# Patient Record
Sex: Male | Born: 1940
Health system: Southern US, Community
[De-identification: ages and names within clinical notes are randomized; demographics above are authoritative.]

## PROBLEM LIST (undated history)

## (undated) DIAGNOSIS — M2540 Effusion, unspecified joint: Secondary | ICD-10-CM

## (undated) DIAGNOSIS — C61 Malignant neoplasm of prostate: Secondary | ICD-10-CM

## (undated) DIAGNOSIS — E785 Hyperlipidemia, unspecified: Secondary | ICD-10-CM

## (undated) DIAGNOSIS — R519 Headache, unspecified: Secondary | ICD-10-CM

## (undated) DIAGNOSIS — R350 Frequency of micturition: Secondary | ICD-10-CM

## (undated) DIAGNOSIS — K5900 Constipation, unspecified: Secondary | ICD-10-CM

## (undated) DIAGNOSIS — M1990 Unspecified osteoarthritis, unspecified site: Secondary | ICD-10-CM

## (undated) DIAGNOSIS — J189 Pneumonia, unspecified organism: Secondary | ICD-10-CM

## (undated) DIAGNOSIS — M1712 Unilateral primary osteoarthritis, left knee: Secondary | ICD-10-CM

## (undated) DIAGNOSIS — I10 Essential (primary) hypertension: Secondary | ICD-10-CM

## (undated) DIAGNOSIS — Z973 Presence of spectacles and contact lenses: Secondary | ICD-10-CM

## (undated) DIAGNOSIS — R351 Nocturia: Secondary | ICD-10-CM

## (undated) DIAGNOSIS — M2550 Pain in unspecified joint: Secondary | ICD-10-CM

## (undated) DIAGNOSIS — Z972 Presence of dental prosthetic device (complete) (partial): Secondary | ICD-10-CM

## (undated) DIAGNOSIS — R51 Headache: Secondary | ICD-10-CM

## (undated) DIAGNOSIS — T8482XA Fibrosis due to internal orthopedic prosthetic devices, implants and grafts, initial encounter: Secondary | ICD-10-CM

## (undated) HISTORY — DX: Hyperlipidemia, unspecified: E78.5

## (undated) HISTORY — PX: HERNIA REPAIR: SHX51

## (undated) HISTORY — DX: Essential (primary) hypertension: I10

## (undated) HISTORY — PX: COLONOSCOPY: SHX174

---

## 2000-08-29 ENCOUNTER — Encounter: Payer: Self-pay | Admitting: Internal Medicine

## 2003-05-02 ENCOUNTER — Encounter: Payer: Self-pay | Admitting: Internal Medicine

## 2003-05-02 ENCOUNTER — Ambulatory Visit (HOSPITAL_COMMUNITY): Admission: RE | Admit: 2003-05-02 | Discharge: 2003-05-02 | Payer: Self-pay | Admitting: Internal Medicine

## 2004-08-18 ENCOUNTER — Encounter: Payer: Self-pay | Admitting: Internal Medicine

## 2004-11-02 ENCOUNTER — Ambulatory Visit: Payer: Self-pay | Admitting: Internal Medicine

## 2005-01-14 ENCOUNTER — Ambulatory Visit: Payer: Self-pay | Admitting: Internal Medicine

## 2005-02-19 ENCOUNTER — Ambulatory Visit: Payer: Self-pay | Admitting: Internal Medicine

## 2005-03-08 ENCOUNTER — Ambulatory Visit: Payer: Self-pay | Admitting: Internal Medicine

## 2005-03-17 ENCOUNTER — Ambulatory Visit (HOSPITAL_COMMUNITY): Admission: RE | Admit: 2005-03-17 | Discharge: 2005-03-17 | Payer: Self-pay | Admitting: Orthopedic Surgery

## 2005-05-04 ENCOUNTER — Ambulatory Visit: Payer: Self-pay | Admitting: Internal Medicine

## 2005-05-11 ENCOUNTER — Ambulatory Visit: Payer: Self-pay | Admitting: Internal Medicine

## 2005-08-11 ENCOUNTER — Ambulatory Visit: Payer: Self-pay | Admitting: Internal Medicine

## 2005-08-18 ENCOUNTER — Ambulatory Visit: Payer: Self-pay | Admitting: Internal Medicine

## 2005-08-25 ENCOUNTER — Ambulatory Visit: Payer: Self-pay | Admitting: Internal Medicine

## 2005-11-15 ENCOUNTER — Ambulatory Visit: Payer: Self-pay | Admitting: Internal Medicine

## 2006-01-18 ENCOUNTER — Ambulatory Visit: Payer: Self-pay | Admitting: Internal Medicine

## 2006-02-08 ENCOUNTER — Ambulatory Visit: Payer: Self-pay | Admitting: Internal Medicine

## 2006-02-15 ENCOUNTER — Ambulatory Visit: Payer: Self-pay | Admitting: Internal Medicine

## 2006-06-21 ENCOUNTER — Ambulatory Visit: Payer: Self-pay | Admitting: Internal Medicine

## 2006-09-26 ENCOUNTER — Ambulatory Visit: Payer: Self-pay | Admitting: Internal Medicine

## 2006-10-11 ENCOUNTER — Ambulatory Visit: Payer: Self-pay | Admitting: Internal Medicine

## 2006-10-27 ENCOUNTER — Ambulatory Visit: Payer: Self-pay | Admitting: Internal Medicine

## 2006-10-27 ENCOUNTER — Encounter: Payer: Self-pay | Admitting: Internal Medicine

## 2006-10-27 ENCOUNTER — Encounter (INDEPENDENT_AMBULATORY_CARE_PROVIDER_SITE_OTHER): Payer: Self-pay | Admitting: *Deleted

## 2006-10-27 LAB — HM COLONOSCOPY

## 2006-11-22 ENCOUNTER — Ambulatory Visit: Payer: Self-pay | Admitting: Internal Medicine

## 2007-04-11 ENCOUNTER — Ambulatory Visit: Payer: Self-pay | Admitting: Internal Medicine

## 2007-06-19 DIAGNOSIS — E1159 Type 2 diabetes mellitus with other circulatory complications: Secondary | ICD-10-CM

## 2007-06-19 DIAGNOSIS — I10 Essential (primary) hypertension: Secondary | ICD-10-CM | POA: Insufficient documentation

## 2007-06-19 DIAGNOSIS — E785 Hyperlipidemia, unspecified: Secondary | ICD-10-CM | POA: Insufficient documentation

## 2007-07-04 ENCOUNTER — Ambulatory Visit: Payer: Self-pay | Admitting: Internal Medicine

## 2007-07-04 LAB — CONVERTED CEMR LAB
ALT: 16 units/L (ref 0–53)
AST: 21 units/L (ref 0–37)
Albumin: 3.8 g/dL (ref 3.5–5.2)
Alkaline Phosphatase: 109 units/L (ref 39–117)
BUN: 17 mg/dL (ref 6–23)
Basophils Absolute: 0 10*3/uL (ref 0.0–0.1)
Basophils Relative: 0.9 % (ref 0.0–1.0)
Bilirubin, Direct: 0.1 mg/dL (ref 0.0–0.3)
CO2: 33 meq/L — ABNORMAL HIGH (ref 19–32)
Calcium: 9.9 mg/dL (ref 8.4–10.5)
Chloride: 107 meq/L (ref 96–112)
Cholesterol: 182 mg/dL (ref 0–200)
Creatinine, Ser: 1 mg/dL (ref 0.4–1.5)
Eosinophils Absolute: 0.1 10*3/uL (ref 0.0–0.6)
Eosinophils Relative: 1.6 % (ref 0.0–5.0)
GFR calc Af Amer: 97 mL/min
GFR calc non Af Amer: 80 mL/min
Glucose, Bld: 149 mg/dL — ABNORMAL HIGH (ref 70–99)
HCT: 40.1 % (ref 39.0–52.0)
HDL: 40 mg/dL (ref >39.0)
Hemoglobin: 13.6 g/dL (ref 13.0–17.0)
Hgb A1c MFr Bld: 6.6 % — ABNORMAL HIGH (ref 4.6–6.0)
LDL Cholesterol: 124 mg/dL — ABNORMAL HIGH (ref 0–99)
Lymphocytes Relative: 48.4 % — ABNORMAL HIGH (ref 12.0–46.0)
MCHC: 34 g/dL (ref 30.0–36.0)
MCV: 95.4 fL (ref 78.0–100.0)
Monocytes Absolute: 0.5 10*3/uL (ref 0.2–0.7)
Monocytes Relative: 8.2 % (ref 3.0–11.0)
Neutro Abs: 2.2 10*3/uL (ref 1.4–7.7)
Neutrophils Relative %: 40.9 % — ABNORMAL LOW (ref 43.0–77.0)
PSA: 1.26 ng/mL (ref 0.10–4.00)
Platelets: 327 10*3/uL (ref 150–400)
Potassium: 3.6 meq/L (ref 3.5–5.1)
RBC: 4.2 M/uL — ABNORMAL LOW (ref 4.22–5.81)
RDW: 12.5 % (ref 11.5–14.6)
Sodium: 147 meq/L — ABNORMAL HIGH (ref 135–145)
TSH: 1.48 microintl units/mL (ref 0.35–5.50)
Total Bilirubin: 1.1 mg/dL (ref 0.3–1.2)
Total CHOL/HDL Ratio: 4.6
Total Protein: 6.9 g/dL (ref 6.0–8.3)
Triglycerides: 88 mg/dL (ref 0–149)
VLDL: 18 mg/dL (ref 0–40)
WBC: 5.5 10*3/uL (ref 4.5–10.5)

## 2007-07-11 ENCOUNTER — Ambulatory Visit: Payer: Self-pay | Admitting: Internal Medicine

## 2007-07-11 ENCOUNTER — Encounter: Payer: Self-pay | Admitting: Internal Medicine

## 2007-10-31 ENCOUNTER — Ambulatory Visit: Payer: Self-pay | Admitting: Internal Medicine

## 2008-01-01 ENCOUNTER — Telehealth: Payer: Self-pay | Admitting: Internal Medicine

## 2008-01-03 ENCOUNTER — Ambulatory Visit: Payer: Self-pay | Admitting: Internal Medicine

## 2008-02-12 ENCOUNTER — Ambulatory Visit: Payer: Self-pay | Admitting: Internal Medicine

## 2008-02-12 DIAGNOSIS — E119 Type 2 diabetes mellitus without complications: Secondary | ICD-10-CM | POA: Insufficient documentation

## 2008-02-16 LAB — CONVERTED CEMR LAB
ALT: 15 units/L (ref 0–53)
AST: 19 units/L (ref 0–37)
Albumin: 4.2 g/dL (ref 3.5–5.2)
Alkaline Phosphatase: 119 units/L — ABNORMAL HIGH (ref 39–117)
BUN: 15 mg/dL (ref 6–23)
Bilirubin, Direct: 0.2 mg/dL (ref 0.0–0.3)
CO2: 31 meq/L (ref 19–32)
Calcium: 10.4 mg/dL (ref 8.4–10.5)
Chloride: 100 meq/L (ref 96–112)
Cholesterol: 198 mg/dL (ref 0–200)
Creatinine, Ser: 1.1 mg/dL (ref 0.4–1.5)
GFR calc Af Amer: 87 mL/min
GFR calc non Af Amer: 72 mL/min
Glucose, Bld: 141 mg/dL — ABNORMAL HIGH (ref 70–99)
HDL: 37.9 mg/dL — ABNORMAL LOW (ref >39.0)
Hgb A1c MFr Bld: 6.7 % — ABNORMAL HIGH (ref 4.6–6.0)
LDL Cholesterol: 134 mg/dL — ABNORMAL HIGH (ref 0–99)
Potassium: 3.8 meq/L (ref 3.5–5.1)
Sodium: 138 meq/L (ref 135–145)
Total Bilirubin: 1.1 mg/dL (ref 0.3–1.2)
Total CHOL/HDL Ratio: 5.2
Total Protein: 7.1 g/dL (ref 6.0–8.3)
Triglycerides: 130 mg/dL (ref 0–149)
VLDL: 26 mg/dL (ref 0–40)

## 2008-05-15 ENCOUNTER — Ambulatory Visit: Payer: Self-pay | Admitting: Internal Medicine

## 2008-05-17 LAB — CONVERTED CEMR LAB
ALT: 18 units/L (ref 0–53)
AST: 21 units/L (ref 0–37)
Albumin: 4 g/dL (ref 3.5–5.2)
Alkaline Phosphatase: 120 units/L — ABNORMAL HIGH (ref 39–117)
BUN: 12 mg/dL (ref 6–23)
Bilirubin, Direct: 0.1 mg/dL (ref 0.0–0.3)
CO2: 28 meq/L (ref 19–32)
Calcium: 9.8 mg/dL (ref 8.4–10.5)
Chloride: 107 meq/L (ref 96–112)
Cholesterol: 171 mg/dL (ref 0–200)
Creatinine, Ser: 0.9 mg/dL (ref 0.4–1.5)
GFR calc Af Amer: 109 mL/min
GFR calc non Af Amer: 90 mL/min
Glucose, Bld: 115 mg/dL — ABNORMAL HIGH (ref 70–99)
HDL: 34.6 mg/dL — ABNORMAL LOW (ref >39.0)
Hgb A1c MFr Bld: 6.2 % — ABNORMAL HIGH (ref 4.6–6.0)
LDL Cholesterol: 111 mg/dL — ABNORMAL HIGH (ref 0–99)
Potassium: 3.5 meq/L (ref 3.5–5.1)
Sodium: 141 meq/L (ref 135–145)
Total Bilirubin: 1 mg/dL (ref 0.3–1.2)
Total CHOL/HDL Ratio: 4.9
Total Protein: 7 g/dL (ref 6.0–8.3)
Triglycerides: 125 mg/dL (ref 0–149)
VLDL: 25 mg/dL (ref 0–40)

## 2008-06-13 ENCOUNTER — Encounter: Payer: Self-pay | Admitting: Internal Medicine

## 2008-06-17 ENCOUNTER — Telehealth (INDEPENDENT_AMBULATORY_CARE_PROVIDER_SITE_OTHER): Payer: Self-pay | Admitting: *Deleted

## 2008-09-26 ENCOUNTER — Ambulatory Visit: Payer: Self-pay | Admitting: Internal Medicine

## 2008-09-30 LAB — CONVERTED CEMR LAB
ALT: 30 units/L (ref 0–53)
AST: 24 units/L (ref 0–37)
BUN: 18 mg/dL (ref 6–23)
CO2: 30 meq/L (ref 19–32)
Calcium: 9.5 mg/dL (ref 8.4–10.5)
Chloride: 102 meq/L (ref 96–112)
Cholesterol: 158 mg/dL (ref 0–200)
Creatinine, Ser: 1 mg/dL (ref 0.4–1.5)
GFR calc Af Amer: 96 mL/min
GFR calc non Af Amer: 80 mL/min
Glucose, Bld: 109 mg/dL — ABNORMAL HIGH (ref 70–99)
HDL: 42.1 mg/dL (ref >39.0)
Hgb A1c MFr Bld: 5.8 % (ref 4.6–6.0)
LDL Cholesterol: 94 mg/dL (ref 0–99)
Potassium: 3.4 meq/L — ABNORMAL LOW (ref 3.5–5.1)
Sodium: 139 meq/L (ref 135–145)
Total CHOL/HDL Ratio: 3.8
Triglycerides: 111 mg/dL (ref 0–149)
VLDL: 22 mg/dL (ref 0–40)

## 2009-01-31 ENCOUNTER — Ambulatory Visit: Payer: Self-pay | Admitting: Internal Medicine

## 2009-02-06 ENCOUNTER — Encounter: Payer: Self-pay | Admitting: Cardiovascular Disease

## 2009-02-06 ENCOUNTER — Encounter: Payer: Self-pay | Admitting: Internal Medicine

## 2009-02-06 ENCOUNTER — Ambulatory Visit: Payer: Self-pay

## 2009-02-11 ENCOUNTER — Telehealth: Payer: Self-pay | Admitting: Internal Medicine

## 2009-02-25 ENCOUNTER — Encounter: Payer: Self-pay | Admitting: Internal Medicine

## 2009-02-25 ENCOUNTER — Telehealth: Payer: Self-pay | Admitting: Internal Medicine

## 2009-03-10 ENCOUNTER — Ambulatory Visit: Payer: Self-pay | Admitting: Internal Medicine

## 2009-03-11 ENCOUNTER — Ambulatory Visit: Payer: Self-pay | Admitting: Internal Medicine

## 2009-03-13 ENCOUNTER — Ambulatory Visit: Payer: Self-pay | Admitting: Internal Medicine

## 2009-03-14 ENCOUNTER — Encounter: Payer: Self-pay | Admitting: Internal Medicine

## 2009-03-18 ENCOUNTER — Ambulatory Visit: Payer: Self-pay | Admitting: Internal Medicine

## 2009-04-08 ENCOUNTER — Ambulatory Visit: Payer: Self-pay | Admitting: Internal Medicine

## 2009-06-18 ENCOUNTER — Telehealth: Payer: Self-pay | Admitting: Internal Medicine

## 2009-06-19 ENCOUNTER — Ambulatory Visit: Payer: Self-pay | Admitting: Family Medicine

## 2009-06-25 ENCOUNTER — Ambulatory Visit: Payer: Self-pay | Admitting: Internal Medicine

## 2009-06-25 LAB — CONVERTED CEMR LAB
ALT: 12 units/L (ref 0–53)
AST: 15 units/L (ref 0–37)
Albumin: 3.7 g/dL (ref 3.5–5.2)
Alkaline Phosphatase: 147 units/L — ABNORMAL HIGH (ref 39–117)
BUN: 18 mg/dL (ref 6–23)
Basophils Absolute: 0 10*3/uL (ref 0.0–0.1)
Basophils Relative: 0.5 % (ref 0.0–3.0)
Bilirubin Urine: NEGATIVE
Bilirubin, Direct: 0 mg/dL (ref 0.0–0.3)
Blood in Urine, dipstick: NEGATIVE
CO2: 29 meq/L (ref 19–32)
Calcium: 9.4 mg/dL (ref 8.4–10.5)
Chloride: 108 meq/L (ref 96–112)
Cholesterol: 175 mg/dL (ref 0–200)
Creatinine, Ser: 1.1 mg/dL (ref 0.4–1.5)
Eosinophils Absolute: 0.1 10*3/uL (ref 0.0–0.7)
Eosinophils Relative: 2.1 % (ref 0.0–5.0)
GFR calc non Af Amer: 86.07 mL/min (ref >60)
Glucose, Bld: 228 mg/dL — ABNORMAL HIGH (ref 70–99)
HCT: 40.4 % (ref 39.0–52.0)
HDL: 39.2 mg/dL (ref >39.00)
Hemoglobin: 13.9 g/dL (ref 13.0–17.0)
LDL Cholesterol: 105 mg/dL — ABNORMAL HIGH (ref 0–99)
Lymphocytes Relative: 47.8 % — ABNORMAL HIGH (ref 12.0–46.0)
Lymphs Abs: 2.6 10*3/uL (ref 0.7–4.0)
MCHC: 34.3 g/dL (ref 30.0–36.0)
MCV: 95.4 fL (ref 78.0–100.0)
Monocytes Absolute: 0.4 10*3/uL (ref 0.1–1.0)
Monocytes Relative: 7.3 % (ref 3.0–12.0)
Neutro Abs: 2.2 10*3/uL (ref 1.4–7.7)
Neutrophils Relative %: 42.3 % — ABNORMAL LOW (ref 43.0–77.0)
Nitrite: NEGATIVE
PSA: 1.26 ng/mL (ref 0.10–4.00)
Platelets: 295 10*3/uL (ref 150.0–400.0)
Potassium: 3.9 meq/L (ref 3.5–5.1)
RBC: 4.23 M/uL (ref 4.22–5.81)
RDW: 12 % (ref 11.5–14.6)
Sodium: 143 meq/L (ref 135–145)
Specific Gravity, Urine: 1.025
TSH: 1.82 microintl units/mL (ref 0.35–5.50)
Total Bilirubin: 1 mg/dL (ref 0.3–1.2)
Total CHOL/HDL Ratio: 4
Total Protein: 7 g/dL (ref 6.0–8.3)
Triglycerides: 152 mg/dL — ABNORMAL HIGH (ref 0.0–149.0)
Urobilinogen, UA: 0.2
VLDL: 30.4 mg/dL (ref 0.0–40.0)
WBC: 5.3 10*3/uL (ref 4.5–10.5)
pH: 5.5

## 2009-07-03 ENCOUNTER — Ambulatory Visit: Payer: Self-pay | Admitting: Internal Medicine

## 2009-07-04 LAB — CONVERTED CEMR LAB: Hgb A1c MFr Bld: 7.8 % — ABNORMAL HIGH (ref 4.6–6.5)

## 2009-07-10 ENCOUNTER — Ambulatory Visit: Payer: Self-pay | Admitting: Family Medicine

## 2009-07-29 ENCOUNTER — Ambulatory Visit: Payer: Self-pay | Admitting: Internal Medicine

## 2009-07-30 ENCOUNTER — Encounter: Payer: Self-pay | Admitting: Internal Medicine

## 2009-07-31 ENCOUNTER — Encounter: Admission: RE | Admit: 2009-07-31 | Discharge: 2009-07-31 | Payer: Self-pay | Admitting: Internal Medicine

## 2009-09-18 ENCOUNTER — Encounter: Payer: Self-pay | Admitting: Internal Medicine

## 2009-10-03 ENCOUNTER — Ambulatory Visit: Payer: Self-pay | Admitting: Internal Medicine

## 2009-10-07 LAB — CONVERTED CEMR LAB
ALT: 19 units/L (ref 0–53)
AST: 17 units/L (ref 0–37)
Albumin: 3.8 g/dL (ref 3.5–5.2)
Alkaline Phosphatase: 143 units/L — ABNORMAL HIGH (ref 39–117)
BUN: 16 mg/dL (ref 6–23)
Bilirubin, Direct: 0 mg/dL (ref 0.0–0.3)
CO2: 28 meq/L (ref 19–32)
Calcium: 9.4 mg/dL (ref 8.4–10.5)
Chloride: 104 meq/L (ref 96–112)
Cholesterol: 175 mg/dL (ref 0–200)
Creatinine, Ser: 0.9 mg/dL (ref 0.4–1.5)
GFR calc non Af Amer: 108.41 mL/min (ref >60)
Glucose, Bld: 158 mg/dL — ABNORMAL HIGH (ref 70–99)
HDL: 37.6 mg/dL — ABNORMAL LOW (ref >39.00)
Hgb A1c MFr Bld: 6.1 % (ref 4.6–6.5)
LDL Cholesterol: 112 mg/dL — ABNORMAL HIGH (ref 0–99)
Potassium: 3.7 meq/L (ref 3.5–5.1)
Sodium: 138 meq/L (ref 135–145)
TSH: 1.2 microintl units/mL (ref 0.35–5.50)
Total Bilirubin: 0.8 mg/dL (ref 0.3–1.2)
Total CHOL/HDL Ratio: 5
Total Protein: 7.3 g/dL (ref 6.0–8.3)
Triglycerides: 125 mg/dL (ref 0.0–149.0)
VLDL: 25 mg/dL (ref 0.0–40.0)

## 2009-12-20 HISTORY — PX: TOTAL KNEE ARTHROPLASTY: SHX125

## 2010-01-28 ENCOUNTER — Ambulatory Visit: Payer: Self-pay | Admitting: Internal Medicine

## 2010-01-28 LAB — CONVERTED CEMR LAB
ALT: 16 units/L (ref 0–53)
AST: 16 units/L (ref 0–37)
Albumin: 3.8 g/dL (ref 3.5–5.2)
Alkaline Phosphatase: 146 units/L — ABNORMAL HIGH (ref 39–117)
BUN: 14 mg/dL (ref 6–23)
Bilirubin, Direct: 0.1 mg/dL (ref 0.0–0.3)
CO2: 28 meq/L (ref 19–32)
Calcium: 9.4 mg/dL (ref 8.4–10.5)
Chloride: 107 meq/L (ref 96–112)
Cholesterol: 200 mg/dL (ref 0–200)
Creatinine, Ser: 1 mg/dL (ref 0.4–1.5)
GFR calc non Af Amer: 95.9 mL/min (ref >60)
Glucose, Bld: 147 mg/dL — ABNORMAL HIGH (ref 70–99)
HDL: 43.4 mg/dL (ref >39.00)
Hgb A1c MFr Bld: 6.1 % (ref 4.6–6.5)
LDL Cholesterol: 136 mg/dL — ABNORMAL HIGH (ref 0–99)
Potassium: 3.7 meq/L (ref 3.5–5.1)
Sodium: 140 meq/L (ref 135–145)
Total Bilirubin: 0.5 mg/dL (ref 0.3–1.2)
Total CHOL/HDL Ratio: 5
Total Protein: 7 g/dL (ref 6.0–8.3)
Triglycerides: 104 mg/dL (ref 0.0–149.0)
VLDL: 20.8 mg/dL (ref 0.0–40.0)

## 2010-02-04 ENCOUNTER — Ambulatory Visit: Payer: Self-pay | Admitting: Internal Medicine

## 2010-03-23 ENCOUNTER — Telehealth: Payer: Self-pay | Admitting: Internal Medicine

## 2010-05-27 ENCOUNTER — Ambulatory Visit: Payer: Self-pay | Admitting: Internal Medicine

## 2010-05-27 LAB — CONVERTED CEMR LAB
ALT: 16 units/L (ref 0–53)
AST: 19 units/L (ref 0–37)
Albumin: 3.8 g/dL (ref 3.5–5.2)
Alkaline Phosphatase: 131 units/L — ABNORMAL HIGH (ref 39–117)
BUN: 19 mg/dL (ref 6–23)
Bilirubin, Direct: 0.1 mg/dL (ref 0.0–0.3)
CO2: 28 meq/L (ref 19–32)
Calcium: 9.3 mg/dL (ref 8.4–10.5)
Chloride: 107 meq/L (ref 96–112)
Cholesterol: 189 mg/dL (ref 0–200)
Creatinine, Ser: 1 mg/dL (ref 0.4–1.5)
GFR calc non Af Amer: 94.71 mL/min (ref >60)
Glucose, Bld: 139 mg/dL — ABNORMAL HIGH (ref 70–99)
HDL: 40.9 mg/dL (ref >39.00)
Hgb A1c MFr Bld: 5.9 % (ref 4.6–6.5)
LDL Cholesterol: 125 mg/dL — ABNORMAL HIGH (ref 0–99)
Potassium: 3.8 meq/L (ref 3.5–5.1)
Sodium: 141 meq/L (ref 135–145)
Total Bilirubin: 0.9 mg/dL (ref 0.3–1.2)
Total CHOL/HDL Ratio: 5
Total Protein: 6.9 g/dL (ref 6.0–8.3)
Triglycerides: 114 mg/dL (ref 0.0–149.0)
VLDL: 22.8 mg/dL (ref 0.0–40.0)

## 2010-06-03 ENCOUNTER — Ambulatory Visit: Payer: Self-pay | Admitting: Internal Medicine

## 2010-07-01 ENCOUNTER — Emergency Department (HOSPITAL_COMMUNITY): Admission: EM | Admit: 2010-07-01 | Discharge: 2010-07-01 | Payer: Self-pay | Admitting: Emergency Medicine

## 2010-07-06 ENCOUNTER — Ambulatory Visit: Payer: Self-pay | Admitting: Family Medicine

## 2010-07-20 ENCOUNTER — Encounter: Payer: Self-pay | Admitting: Internal Medicine

## 2010-07-27 ENCOUNTER — Telehealth: Payer: Self-pay | Admitting: Internal Medicine

## 2010-08-18 ENCOUNTER — Telehealth: Payer: Self-pay | Admitting: Internal Medicine

## 2010-08-18 ENCOUNTER — Ambulatory Visit: Payer: Self-pay | Admitting: Internal Medicine

## 2010-08-28 ENCOUNTER — Telehealth: Payer: Self-pay | Admitting: Internal Medicine

## 2010-09-07 ENCOUNTER — Telehealth: Payer: Self-pay | Admitting: Internal Medicine

## 2010-09-11 ENCOUNTER — Ambulatory Visit: Payer: Self-pay | Admitting: Internal Medicine

## 2010-09-14 ENCOUNTER — Ambulatory Visit: Payer: Self-pay | Admitting: Internal Medicine

## 2010-09-16 ENCOUNTER — Inpatient Hospital Stay (HOSPITAL_COMMUNITY): Admission: RE | Admit: 2010-09-16 | Discharge: 2010-09-21 | Payer: Self-pay | Admitting: Orthopedic Surgery

## 2010-10-07 ENCOUNTER — Ambulatory Visit: Payer: Self-pay | Admitting: Internal Medicine

## 2010-10-07 LAB — CONVERTED CEMR LAB: INR: 1.1

## 2010-10-09 ENCOUNTER — Ambulatory Visit: Payer: Self-pay | Admitting: Internal Medicine

## 2010-10-09 LAB — CONVERTED CEMR LAB: INR: 1.3

## 2010-10-12 ENCOUNTER — Ambulatory Visit: Payer: Self-pay | Admitting: Internal Medicine

## 2010-10-12 LAB — CONVERTED CEMR LAB: INR: 2.3

## 2010-10-16 ENCOUNTER — Ambulatory Visit: Payer: Self-pay | Admitting: Internal Medicine

## 2010-10-16 LAB — CONVERTED CEMR LAB: INR: 3.9

## 2010-11-09 ENCOUNTER — Ambulatory Visit: Payer: Self-pay | Admitting: Internal Medicine

## 2010-11-16 ENCOUNTER — Ambulatory Visit: Payer: Self-pay | Admitting: Internal Medicine

## 2010-11-19 ENCOUNTER — Telehealth: Payer: Self-pay | Admitting: Internal Medicine

## 2010-11-19 LAB — CONVERTED CEMR LAB
ALT: 16 units/L (ref 0–53)
AST: 17 units/L (ref 0–37)
Albumin: 3.8 g/dL (ref 3.5–5.2)
Alkaline Phosphatase: 138 units/L — ABNORMAL HIGH (ref 39–117)
BUN: 15 mg/dL (ref 6–23)
Bilirubin, Direct: 0.1 mg/dL (ref 0.0–0.3)
CO2: 25 meq/L (ref 19–32)
Calcium: 9.7 mg/dL (ref 8.4–10.5)
Chloride: 108 meq/L (ref 96–112)
Cholesterol: 154 mg/dL (ref 0–200)
Creatinine, Ser: 0.9 mg/dL (ref 0.4–1.5)
GFR calc non Af Amer: 110.88 mL/min (ref >60)
Glucose, Bld: 138 mg/dL — ABNORMAL HIGH (ref 70–99)
H Pylori IgG: POSITIVE
HDL: 46.8 mg/dL (ref >39.00)
LDL Cholesterol: 96 mg/dL (ref 0–99)
Potassium: 4.3 meq/L (ref 3.5–5.1)
Sodium: 142 meq/L (ref 135–145)
TSH: 2.08 microintl units/mL (ref 0.35–5.50)
Total Bilirubin: 0.7 mg/dL (ref 0.3–1.2)
Total CHOL/HDL Ratio: 3
Total Protein: 7 g/dL (ref 6.0–8.3)
Triglycerides: 55 mg/dL (ref 0.0–149.0)
VLDL: 11 mg/dL (ref 0.0–40.0)

## 2011-01-17 LAB — CONVERTED CEMR LAB
CK-MB: 2 ng/mL (ref 0.3–4.0)
Total CK: 127 units/L (ref 7–195)

## 2011-01-21 NOTE — Assessment & Plan Note (Signed)
Summary: surgical clearance (knee replacement)/et   Vital Signs:  Patient profile:   70 year old male Weight:      178 pounds Temp:     98.6 degrees F oral Pulse rate:   60 / minute Pulse rhythm:   regular Resp:     12 per minute BP sitting:   158 / 88  (left arm) Cuff size:   regular  Vitals Entered By: Gladis Riffle, RN (August 18, 2010 9:33 AM) CC: surgical clearance for right knee replacement at [facility], Dr Reyne Dumas Is Patient Diabetic? Yes Did you bring your meter with you today? No Comments does not check CBGs at home   CC:  surgical clearance for right knee replacement at [facility] and Dr Reyne Dumas.  History of Present Illness: preoperative clearance requested by dr Darrelyn Hillock  end stage OA---right knee  DIABETES MELLITUS, TYPE II (ICD-250.00) --not requuired any treatment HYPERTENSION (ICD-401.9)---no trouble on meds HYPERLIPIDEMIA (ICD-272.4)---tolerating meds  All other systems reviewed and were negative     Preventive Screening-Counseling & Management  Alcohol-Tobacco     Smoking Status: never  Current Problems (verified): 1)  Popliteal Cyst  (ICD-727.51) 2)  Osteoarthrosis Unspec Whether Gen/loc Lower Leg  (ICD-715.96) 3)  Diabetes Mellitus, Type II  (ICD-250.00) 4)  Well Adult Exam  (ICD-V70.0) 5)  Hypertension  (ICD-401.9) 6)  Hyperlipidemia  (ICD-272.4)  Current Medications (verified): 1)  Lisinopril-Hydrochlorothiazide 20-25 Mg  Tabs (Lisinopril-Hydrochlorothiazide) .... Take 1 Tab By Mouth Daily 2)  Simvastatin 40 Mg Tabs (Simvastatin) .... One By Mouth Daily 3)  Aspirin 81 Mg  Tbec (Aspirin) .... One By Mouth Every Day 4)  Nitroglycerin 0.3 Mg Subl (Nitroglycerin) .[name].. 1 Sublingual Every 5 Minutes As Needed For Chest Pain Up To 3 Doses Daily 5)  Oxycodone-Acetaminophen 5-325 Mg Tabs (Oxycodone-Acetaminophen) .[name].. 1 Tab By Mouth Q 6 Hour As Needed Pain 6)  Meloxicam 15 Mg Tabs (Meloxicam) .[name].. 1 Tab By Mouth Q Am As Needed  Pain  Allergies: 1)  ! Micardis (Telmisartan)  Past History:  Past Medical History: Last updated: 06/19/2007 Hyperlipidemia Hypertension  Family History: Last updated: 07/11/2007 mother decreased 40s unknown cause father deceased 64s unknown cause ? brain tumor  Social History: Last updated: 07/11/2007 Married Never Smoked 4 kids healthy Occupation: Holiday representative  Risk Factors: Smoking Status: never (08/18/2010)  Past Surgical History: Denies surgical history  Physical Exam  General:  alert and well-developed.   Head:  normocephalic and atraumatic.   Eyes:  pupils equal and pupils round.   Ears:  R ear normal and L ear normal.   Neck:  No deformities, masses, or tenderness noted. Chest Wall:  No deformities, masses, tenderness or gynecomastia noted. Lungs:  normal respiratory effort and no intercostal retractions.   Heart:  normal rate and regular rhythm.   Abdomen:  Bowel sounds positive,abdomen soft and non-tender without masses, organomegaly or hernias noted. Msk:  No deformity or scoliosis noted of thoracic or lumbar spine.   Pulses:  R radial normal and L radial normal.   Neurologic:  cranial nerves II-XII intact and gait normal.     Impression & Recommendations:  Problem # 1:  PREOPERATIVE EXAMINATION (ICD-V72.84) he is ok for surgery average risk based ona age I discussed the risks with the patient and his wife. They understand and accept all risks.  Problem # 2:  DIABETES MELLITUS, TYPE II (ICD-250.00) labs checked previously His updated medication list for this problem includes:    Lisinopril-hydrochlorothiazide 20-12.5 Mg Tabs (Lisinopril-hydrochlorothiazide) .Marland KitchenMarland KitchenMarland KitchenMarland Kitchen 2  by mouth once daily (take them at same time)    Aspirin 81 Mg Tbec (Aspirin) ..... One by mouth every day  Labs Reviewed: Creat: 1.0 (05/27/2010)     Last Eye Exam: normal-pt's report (08/20/2009) Reviewed HgBA1c results: 5.9 (05/27/2010)  6.1 (01/28/2010)  Problem # 3:   HYPERTENSION (ICD-401.9) not adequately controlled. C. new medications. Side effects discussed. His updated medication list for this problem includes:    Lisinopril-hydrochlorothiazide 20-12.5 Mg Tabs (Lisinopril-hydrochlorothiazide) .[name]... 2 by mouth once daily (take them at same time)  BP today: 158/88 Prior BP: 124/72 (07/06/2010)  Labs Reviewed: K+: 3.8 (05/27/2010) Creat: : 1.0 (05/27/2010)   Chol: 189 (05/27/2010)   HDL: 40.90 (05/27/2010)   LDL: 125 (05/27/2010)   TG: 114.0 (05/27/2010)  Problem # 4:  HYPERLIPIDEMIA (ICD-272.4)  His updated medication list for this problem includes:    Simvastatin 40 Mg Tabs (Simvastatin) ..... One by mouth daily  Labs Reviewed: SGOT: 19 (05/27/2010)   SGPT: 16 (05/27/2010)   HDL:40.90 (05/27/2010), 43.40 (01/28/2010)  LDL:125 (05/27/2010), 136 (01/28/2010)  Chol:189 (05/27/2010), 200 (01/28/2010)  Trig:114.0 (05/27/2010), 104.0 (01/28/2010)  Complete Medication List: 1)  Lisinopril-hydrochlorothiazide 20-12.5 Mg Tabs (Lisinopril-hydrochlorothiazide) .... 2 by mouth once daily (take them at same time) 2)  Simvastatin 40 Mg Tabs (Simvastatin) .... One by mouth daily 3)  Aspirin 81 Mg Tbec (Aspirin) .... One by mouth every day 4)  Nitroglycerin 0.3 Mg Subl (Nitroglycerin) .[name].. 1 sublingual every 5 minutes as needed for chest pain up to 3 doses daily 5)  Oxycodone-acetaminophen 5-325 Mg Tabs (Oxycodone-acetaminophen) .[name].. 1 tab by mouth q 6 hour as needed pain 6)  Meloxicam 15 Mg Tabs (Meloxicam) .[name].. 1 tab by mouth q am as needed pain  Prescriptions: LISINOPRIL-HYDROCHLOROTHIAZIDE 20-12.5 MG TABS (LISINOPRIL-HYDROCHLOROTHIAZIDE) 2 by mouth once daily (take them at same time)  #180 x 3   Entered and Authorized by:   Birdie Sons MD   Signed by:   Birdie Sons MD on 08/18/2010   Method used:   Electronically to        [facility] [address] (retail)       [phone]       [place], [place]  [address]        Ph: [phone]       Fax: [phone]   RxID:   [id number]   Appended Document: surgical clearance (knee replacement)/et   Immunizations Administered:  Pneumonia Vaccine:    Vaccine Type: Pneumovax    Site: left deltoid    Mfr: Merck    Dose: 0.5 ml    Route: IM    Given by: Gladis Riffle, RN    Exp. Date: 07/25/2011    Lot #: [id number]    VIS given: 07/17/96 version given August 18, 2010.   Appended Document: surgical clearance (knee replacement)/et fax to his orthopedist  Appended Document: surgical clearance (knee replacement)/et done.

## 2011-01-21 NOTE — Progress Notes (Signed)
Summary: Lisinopril-HCTZ  Phone Note Call from Patient Call back at [phone]   Caller: Spouse via VM Call For: Birdie Sons MD Summary of Call: BP med has been increased.  Would like Rx sent to [facility] and [address]. Initial call taken by: Gladis Riffle, RN,  August 18, 2010 1:21 PM  Follow-up for Phone Call        Called and told wife was done to [facility] [address].  She would like it moved as they have changed pharmacies.  See Rx. Follow-up by: Gladis Riffle, RN,  August 18, 2010 1:22 PM    Prescriptions: LISINOPRIL-HYDROCHLOROTHIAZIDE 20-12.5 MG TABS (LISINOPRIL-HYDROCHLOROTHIAZIDE) 2 by mouth once daily (take them at same time)  #180 x 3   Entered by:   Gladis Riffle, RN   Authorized by:   Birdie Sons MD   Signed by:   Gladis Riffle, RN on 08/18/2010   Method used:   Electronically to        [facility] [id number] (retail)       [address]       [place], [place]  [address]       Ph: [phone]       Fax: [phone]   RxID:   [id number]  Rx to [facility] [address] was cancelled via phone message.

## 2011-01-21 NOTE — Assessment & Plan Note (Signed)
Summary: 4 month rov/njr/pt rescd/ccm   Vital Signs:  Patient profile:   70 year old male Weight:      166 pounds BMI:     26.09 Temp:     98.8 degrees F oral Pulse rate:   76 / minute Pulse rhythm:   regular BP sitting:   152 / 90  (left arm) Cuff size:   regular  Vitals Entered By: Alfred Levins, CMA (November 09, 2010 10:03 AM)  CC: f/u   CC:  f/u.  History of Present Illness:  Follow-Up Visit      This is a 70 year old man who presents for Follow-up visit.  The patient denies chest pain and palpitations.  Since the last visit the patient notes no new problems or concerns and being seen by a specialist.  The patient reports taking meds as prescribed.  When questioned about possible medication side effects, the patient notes none.  has had recent TKA note weight loss---he is now eating well  All other systems reviewed and were negative   Current Problems (verified): 1)  Diabetes Mellitus, Type II  (ICD-250.00) 2)  Well Adult Exam  (ICD-V70.0) 3)  Hypertension  (ICD-401.9) 4)  Hyperlipidemia  (ICD-272.4)  Current Medications (verified): 1)  Lisinopril-Hydrochlorothiazide 20-12.5 Mg Tabs (Lisinopril-Hydrochlorothiazide) .... 2 By Mouth Once Daily (Take Them At Same Time) 2)  Simvastatin 40 Mg Tabs (Simvastatin) .... One By Mouth Daily 3)  Aspirin 81 Mg  Tbec (Aspirin) .... One By Mouth Every Day 4)  Nitroglycerin 0.3 Mg Subl (Nitroglycerin) .[name].. 1 Sublingual Every 5 Minutes As Needed For Chest Pain Up To 3 Doses Daily 5)  Oxycodone-Acetaminophen 5-325 Mg Tabs (Oxycodone-Acetaminophen) .[name].. 1 Tab By Mouth Q 6 Hour As Needed Pain 6)  Meloxicam 15 Mg Tabs (Meloxicam) .[name].. 1 Tab By Mouth Q Am As Needed Pain 7)  Labetalol Hcl 200 Mg Tabs (Labetalol Hcl) .[name].. 1 Tab Twice Daily  Allergies (verified): 1)  ! Micardis (Telmisartan)  Past History:  Past Medical History: Last updated: 06/19/2007 Hyperlipidemia Hypertension  Past Surgical History: Last updated:  08/18/2010 Denies surgical history  Family History: Last updated: 07/11/2007 mother decreased 40s unknown cause father deceased 76s unknown cause ? brain tumor  Social History: Last updated: 07/11/2007 Married Never Smoked 4 kids healthy Occupation: Holiday representative  Risk Factors: Smoking Status: never (09/11/2010)  Physical Exam  General:  income elderly male in no acute distress. HEENT exam atraumatic, normocephalic symmetric muscles are intact. Neck is supple. Chest clear auscultation. Cardiac exam S1-S2 are regular. Abdominal exam at present, soft her extremities no clubbing cyanosis or edema. Neurologic exam is alert he is using a cane for ambulation.   Impression & Recommendations:  Problem # 1:  DIABETES MELLITUS, TYPE II (ICD-250.00) Assessment Improved  controlled continue current medications  His updated medication list for this problem includes:    Lisinopril-hydrochlorothiazide 20-12.5 Mg Tabs (Lisinopril-hydrochlorothiazide) .[name]... 2 by mouth once daily (take them at same time)    Aspirin 81 Mg Tbec (Aspirin) ..... One by mouth every day  Labs Reviewed: Creat: 1.0 (05/27/2010)     Last Eye Exam: normal-pt's report (08/20/2009) Reviewed HgBA1c results: 5.9 (05/27/2010)  6.1 (01/28/2010)  Orders: Venipuncture (40102) TLB-Lipid Panel (80061-LIPID) TLB-BMP (Basic Metabolic Panel-BMET) (80048-METABOL) TLB-Hepatic/Liver Function Pnl (80076-HEPATIC) TLB-TSH (Thyroid Stimulating Hormone) (84443-TSH) TLB-H. Pylori Abs(Helicobacter Pylori) (86677-HELICO)  Problem # 2:  HYPERTENSION (ICD-401.9) Assessment: Deteriorated  repeat BP some better His updated medication list for this problem includes:    Lisinopril-hydrochlorothiazide 20-12.5 Mg  Tabs (Lisinopril-hydrochlorothiazide) .[name]... 2 by mouth once daily (take them at same time)    Labetalol Hcl 200 Mg Tabs (Labetalol hcl) .[name]... 1 tab twice daily  BP today: 152/90 Prior BP: 145/85 (09/14/2010)  Labs  Reviewed: K+: 3.8 (05/27/2010) Creat: : 1.0 (05/27/2010)   Chol: 189 (05/27/2010)   HDL: 40.90 (05/27/2010)   LDL: 125 (05/27/2010)   TG: 114.0 (05/27/2010)  Orders: Venipuncture (16109) TLB-Lipid Panel (80061-LIPID) TLB-BMP (Basic Metabolic Panel-BMET) (80048-METABOL) TLB-Hepatic/Liver Function Pnl (80076-HEPATIC) TLB-TSH (Thyroid Stimulating Hormone) (84443-TSH)  Problem # 3:  HYPERLIPIDEMIA (ICD-272.4)  controlled continue current medications  His updated medication list for this problem includes:    Simvastatin 40 Mg Tabs (Simvastatin) ..... One by mouth daily  Labs Reviewed: SGOT: 19 (05/27/2010)   SGPT: 16 (05/27/2010)   HDL:40.90 (05/27/2010), 43.40 (01/28/2010)  LDL:125 (05/27/2010), 136 (01/28/2010)  Chol:189 (05/27/2010), 200 (01/28/2010)  Trig:114.0 (05/27/2010), 104.0 (01/28/2010)  Orders: Venipuncture (60454) TLB-Lipid Panel (80061-LIPID) TLB-BMP (Basic Metabolic Panel-BMET) (80048-METABOL) TLB-Hepatic/Liver Function Pnl (80076-HEPATIC) TLB-TSH (Thyroid Stimulating Hormone) (84443-TSH)  Complete Medication List: 1)  Lisinopril-hydrochlorothiazide 20-12.5 Mg Tabs (Lisinopril-hydrochlorothiazide) .... 2 by mouth once daily (take them at same time) 2)  Simvastatin 40 Mg Tabs (Simvastatin) .... One by mouth daily 3)  Aspirin 81 Mg Tbec (Aspirin) .... One by mouth every day 4)  Nitroglycerin 0.3 Mg Subl (Nitroglycerin) .[name].. 1 sublingual every 5 minutes as needed for chest pain up to 3 doses daily 5)  Oxycodone-acetaminophen 5-325 Mg Tabs (Oxycodone-acetaminophen) .[name].. 1 tab by mouth q 6 hour as needed pain 6)  Meloxicam 15 Mg Tabs (Meloxicam) .[name].. 1 tab by mouth q am as needed pain 7)  Labetalol Hcl 200 Mg Tabs (Labetalol hcl) .[name].. 1 tab twice daily  Patient Instructions: 1)  Please schedule a follow-up appointment in 4 months.   Orders Added: 1)  Est. Patient Level IV [09811] 2)  Venipuncture [36415] 3)  TLB-Lipid Panel [80061-LIPID] 4)  TLB-BMP (Basic Metabolic  Panel-BMET) [80048-METABOL] 5)  TLB-Hepatic/Liver Function Pnl [80076-HEPATIC] 6)  TLB-TSH (Thyroid Stimulating Hormone) [84443-TSH] 7)  TLB-H. Pylori Abs(Helicobacter Pylori) [86677-HELICO]

## 2011-01-21 NOTE — Assessment & Plan Note (Signed)
Summary: RENEW MEDS/CCM   Vital Signs:  Patient Profile:   70 Years Old Male Height:     67 inches Weight:      188.5 pounds Temp:     98.6 degrees F Pulse rate:   70 / minute BP sitting:   164 / 80  (left arm)  Vitals Entered By: Gladis Riffle, RN (January 03, 2008 4:22 PM)                 Chief Complaint:  med review--inpast got headache from micardis-hct so states was told to go back on HCTZ 25 mg qd--out of hctz since 12/30/07--last Rx was done for micardis-hct.  History of Present Illness: confusion with meds---off micardis because of headache. he currently denies any chest pain, shortness of breath, PND, orthopnea.  He is tolerating her medications well.  Current Allergies (reviewed today): ! MICARDIS (TELMISARTAN)  Past Medical History:    Reviewed history from 06/19/2007 and no changes required:       Hyperlipidemia       Hypertension   Family History:    Reviewed history from 07/11/2007 and no changes required:       mother decreased 35s unknown cause       father deceased 59s unknown cause ? brain tumor  Social History:    Reviewed history from 07/11/2007 and no changes required:       Married       Never Smoked       4 kids healthy       Occupation: Holiday representative    Review of Systems       no other complaints in a complete ROS    Physical Exam  General:     Well-developed,well-nourished,in no acute distress; alert,appropriate and cooperative throughout examination Head:     normocephalic and atraumatic.   Eyes:     pupils equal and pupils round.   Neck:     No deformities, masses, or tenderness noted. Lungs:     Normal respiratory effort, chest expands symmetrically. Lungs are clear to auscultation, no crackles or wheezes. Heart:     Normal rate and regular rhythm. S1 and S2 normal without gallop, murmur, click, rub or other extra sounds.    Impression & Recommendations:  Problem # 1:  HYPERTENSION (ICD-401.9) he currently is off all  medications.  Will start hydrochlorothiazide and see what happens with his blood pressure.  I suspect he may need more aggressive treatment but we will make further adjustments based on blood pressures. His updated medication list for this problem includes:    Hydrochlorothiazide 25 Mg Tabs (Hydrochlorothiazide) .[name]... Take 1 tab by mouth every morning  BP today: 164/80 Prior BP: 136/709 (10/31/2007)  Labs Reviewed: Creat: 1.0 (07/04/2007) Chol: 182 (07/04/2007)   HDL: 40.0 (07/04/2007)   LDL: 124 (07/04/2007)   TG: 88 (07/04/2007)   Complete Medication List: 1)  Ibuprofen 800 Mg Tabs (Ibuprofen) .... Take 1 tablet by mouth every eight hours 2)  Hydrochlorothiazide 25 Mg Tabs (Hydrochlorothiazide) .... Take 1 tab by mouth every morning 3)  Simvastatin 40 Mg Tabs (Simvastatin) .... One by mouth daily     Prescriptions: HYDROCHLOROTHIAZIDE 25 MG  TABS (HYDROCHLOROTHIAZIDE) Take 1 tab by mouth every morning  #90 x 3   Entered and Authorized by:   Birdie Sons MD   Signed by:   Birdie Sons MD on 01/03/2008   Method used:   Electronically sent to ...       [facility] [address]       [phone]       [place], [place]  [address]       Ph: [phone]       Fax: [phone]   RxID:   [id number]  ]

## 2011-01-21 NOTE — Progress Notes (Signed)
  Phone Note Call from Patient   Caller: Patient Call For: Birdie Sons MD Summary of Call: Oxycodone refill for 2 days.  Left his out of town while they were at a funeral. [employer] [phone] Initial call taken by: Lynann Beaver CMA,  August 28, 2010 10:53 AM  Follow-up for Phone Call        Notified wife to pick up prescription. Follow-up by: Lynann Beaver CMA,  August 28, 2010 10:58 AM    Prescriptions: OXYCODONE-ACETAMINOPHEN 5-325 MG TABS (OXYCODONE-ACETAMINOPHEN) 1 tab by mouth q 6 hour as needed pain  #20 x 0   Entered by:   Lynann Beaver CMA   Authorized by:   Birdie Sons MD   Signed by:   Lynann Beaver CMA on 08/28/2010   Method used:   Print then Give to Patient   RxID:   [id number]

## 2011-01-21 NOTE — Assessment & Plan Note (Signed)
Summary: PT   Nurse Visit   Allergies: 1)  ! Micardis (Telmisartan) Laboratory Results   Blood Tests   Date/Time Received: October 09, 2010 12:56 PM  Date/Time Reported: October 09, 2010 12:56 PM    INR: 1.3   (Normal Range: 0.88-1.12   Therap INR: 2.0-3.5) Comments: Wynona Canes, CMA  October 09, 2010 12:56 PM     Orders Added: 1)  Est. Patient Level I [99211] 2)  Protime [16109UE]  Laboratory Results   Blood Tests      INR: 1.3   (Normal Range: 0.88-1.12   Therap INR: 2.0-3.5) Comments: Wynona Canes, CMA  October 09, 2010 12:56 PM       ANTICOAGULATION RECORD PREVIOUS REGIMEN & LAB RESULTS   Previous INR:  1.1 on  10/07/2010    NEW REGIMEN & LAB RESULTS Anticoag. Dx: V58.83,V58.61,453.41 Current INR Goal Range: 2.0-3.0 Current INR: 1.3 Current Coumadin Dose(mg): 10mg  qd Regimen: 10mg  qd       Repeat testing in: 10/12/10 MEDICATIONS LISINOPRIL-HYDROCHLOROTHIAZIDE 20-12.5 MG TABS (LISINOPRIL-HYDROCHLOROTHIAZIDE) 2 by mouth once daily (take them at same time) SIMVASTATIN 40 MG TABS (SIMVASTATIN) one by mouth daily ASPIRIN 81 MG  TBEC (ASPIRIN) one by mouth every day NITROGLYCERIN 0.3 MG SUBL (NITROGLYCERIN) 1 sublingual every 5 minutes as needed for chest pain up to 3 doses daily OXYCODONE-ACETAMINOPHEN 5-325 MG TABS (OXYCODONE-ACETAMINOPHEN) 1 tab by mouth q 6 hour as needed pain MELOXICAM 15 MG TABS (MELOXICAM) 1 tab by mouth q am as needed pain CIPRO 500 MG TABS (CIPROFLOXACIN HCL)  LABETALOL HCL 200 MG TABS (LABETALOL HCL) 1 tab twice daily   Anticoagulation Visit Questionnaire      Coumadin dose missed/changed:  No      Abnormal Bleeding Symptoms:  No   Any diet changes including alcohol intake, vegetables or greens since the last visit:  No Any illnesses or hospitalizations since the last visit:  No Any signs of clotting since the last visit (including chest discomfort, dizziness, shortness of breath, arm tingling, slurred speech,  swelling or redness in leg):  No

## 2011-01-21 NOTE — Assessment & Plan Note (Signed)
Summary: PT/RCD   Nurse Visit   Allergies: 1)  ! Micardis (Telmisartan) Laboratory Results   Blood Tests      INR: 3.9   (Normal Range: 0.88-1.12   Therap INR: 2.0-3.5) Comments: Rita Ohara  October 16, 2010 9:12 AM     Orders Added: 1)  Est. Patient Level I [99211] 2)  Protime [16109UE]   ANTICOAGULATION RECORD PREVIOUS REGIMEN & LAB RESULTS Anticoagulation Diagnosis:  V58.83,V58.61,V45.4 on  10/12/2010 Previous INR Goal Range:  2.0-3.0 on  10/09/2010 Previous INR:  2.3 on  10/12/2010 Previous Coumadin Dose(mg):  10mg  qd on  10/09/2010 Previous Regimen:  10mg  qd on  10/09/2010  NEW REGIMEN & LAB RESULTS Current INR: 3.9 Regimen: 10mg  qd  (no change)  Repeat testing in: Patient is going to stop coumadin  Anticoagulation Visit Questionnaire Coumadin dose missed/changed:  No Abnormal Bleeding Symptoms:  No  Any diet changes including alcohol intake, vegetables or greens since the last visit:  No Any illnesses or hospitalizations since the last visit:  No Any signs of clotting since the last visit (including chest discomfort, dizziness, shortness of breath, arm tingling, slurred speech, swelling or redness in leg):  No  MEDICATIONS LISINOPRIL-HYDROCHLOROTHIAZIDE 20-12.5 MG TABS (LISINOPRIL-HYDROCHLOROTHIAZIDE) 2 by mouth once daily (take them at same time) SIMVASTATIN 40 MG TABS (SIMVASTATIN) one by mouth daily ASPIRIN 81 MG  TBEC (ASPIRIN) one by mouth every day NITROGLYCERIN 0.3 MG SUBL (NITROGLYCERIN) 1 sublingual every 5 minutes as needed for chest pain up to 3 doses daily OXYCODONE-ACETAMINOPHEN 5-325 MG TABS (OXYCODONE-ACETAMINOPHEN) 1 tab by mouth q 6 hour as needed pain MELOXICAM 15 MG TABS (MELOXICAM) 1 tab by mouth q am as needed pain CIPRO 500 MG TABS (CIPROFLOXACIN HCL)  LABETALOL HCL 200 MG TABS (LABETALOL HCL) 1 tab twice daily

## 2011-01-21 NOTE — Assessment & Plan Note (Signed)
Summary: 4 mo rov/mm   Vital Signs:  Patient profile:   70 year old male Weight:      182 pounds BMI:     28.61 Temp:     98.3 degrees F oral Pulse rate:   72 / minute Resp:     12 per minute BP sitting:   110 / 78  (left arm)  Vitals Entered By: Gladis Riffle, RN (February 04, 2010 9:53 AM) CC: 4 month rov, labs done--also medical clearance for dentaql surgery (has form) Is Patient Diabetic? Yes Did you bring your meter with you today? No Comments does not check CBGs at home   CC:  4 month rov and labs done--also medical clearance for dentaql surgery (has form).  History of Present Illness:  Follow-Up Visit      This is a 70 year old man who presents for Follow-up visit.  The patient denies chest pain and palpitations.  Since the last visit the patient notes no new problems or concerns and being seen by a specialist.  The patient reports taking meds as prescribed.  When questioned about possible medication side effects, the patient notes none.  has seen dentist---told bp 196/109  All other systems reviewed and were negative   Preventive Screening-Counseling & Management  Alcohol-Tobacco     Smoking Status: never  Current Problems (verified): 1)  Diabetes Mellitus, Type II  (ICD-250.00) 2)  Well Adult Exam  (ICD-V70.0) 3)  Hypertension  (ICD-401.9) 4)  Hyperlipidemia  (ICD-272.4)  Medications Prior to Update: 1)  Hydrochlorothiazide 25 Mg  Tabs (Hydrochlorothiazide) .... Take 1 Tab By Mouth Every Morning 2)  Simvastatin 40 Mg Tabs (Simvastatin) .... One By Mouth Daily 3)  Aspirin 81 Mg  Tbec (Aspirin) .... One By Mouth Every Day 4)  Nitroglycerin 0.3 Mg Subl (Nitroglycerin) .[name].. 1 Sublingual Every 5 Minutes As Needed For Chest Pain Up To 3 Doses Daily 5)  Hydrocodone-Acetaminophen 7.5-750 Mg Tabs (Hydrocodone-Acetaminophen) .... .bistab As Needed Pain  Current Medications (verified): 1)  Hydrochlorothiazide 25 Mg  Tabs (Hydrochlorothiazide) .... Take 1 Tab By Mouth Every  Morning 2)  Simvastatin 40 Mg Tabs (Simvastatin) .... One By Mouth Daily 3)  Aspirin 81 Mg  Tbec (Aspirin) .... One By Mouth Every Day 4)  Nitroglycerin 0.3 Mg Subl (Nitroglycerin) .[name].. 1 Sublingual Every 5 Minutes As Needed For Chest Pain Up To 3 Doses Daily 5)  Hydrocodone-Acetaminophen 7.5-750 Mg Tabs (Hydrocodone-Acetaminophen) .... .bistab As Needed Pain  Allergies: 1)  ! Micardis (Telmisartan)  Past History:  Past Medical History: Last updated: 06/19/2007 Hyperlipidemia Hypertension  Family History: Last updated: 07/11/2007 mother decreased 40s unknown cause father deceased 53s unknown cause ? brain tumor  Social History: Last updated: 07/11/2007 Married Never Smoked 4 kids healthy Occupation: Holiday representative  Risk Factors: Smoking Status: never (02/04/2010)  Physical Exam  General:  alert and well-developed.   Head:  normocephalic and atraumatic.   Eyes:  pupils equal and pupils round.   Ears:  R ear normal and L ear normal.   Neck:  No deformities, masses, or tenderness noted. Chest Wall:  No deformities, masses, tenderness or gynecomastia noted. Lungs:  normal respiratory effort and no intercostal retractions.   Heart:  normal rate and regular rhythm.   Abdomen:  Bowel sounds positive,abdomen soft and non-tender without masses, organomegaly or hernias noted. Msk:  No deformity or scoliosis noted of thoracic or lumbar spine.   Pulses:  R radial normal and L radial normal.   Neurologic:  cranial nerves II-XII  intact and gait normal.     Impression & Recommendations:  Problem # 1:  DIABETES MELLITUS, TYPE II (ICD-250.00) reviewed labs continue exercise and low calorie diet His updated medication list for this problem includes:    Aspirin 81 Mg Tbec (Aspirin) ..... One by mouth every day  Labs Reviewed: Creat: 1.0 (01/28/2010)     Last Eye Exam: normal-pt's report (08/20/2009) Reviewed HgBA1c results: 6.1 (01/28/2010)  6.1 (10/03/2009)  Problem # 2:   HYPERTENSION (ICD-401.9) well controlled continue current medications  His updated medication list for this problem includes:    Hydrochlorothiazide 25 Mg Tabs (Hydrochlorothiazide) .[name]... Take 1 tab by mouth every morning  BP today: 110/78 Prior BP: 112/78 (10/03/2009)  Labs Reviewed: K+: 3.7 (01/28/2010) Creat: : 1.0 (01/28/2010)   Chol: 200 (01/28/2010)   HDL: 43.40 (01/28/2010)   LDL: 136 (01/28/2010)   TG: 104.0 (01/28/2010)  Problem # 3:  HYPERLIPIDEMIA (ICD-272.4) reasonably controlled continue current medications  His updated medication list for this problem includes:    Simvastatin 40 Mg Tabs (Simvastatin) ..... One by mouth daily  Labs Reviewed: SGOT: 16 (01/28/2010)   SGPT: 16 (01/28/2010)   HDL:43.40 (01/28/2010), 37.60 (10/03/2009)  LDL:136 (01/28/2010), 112 (10/03/2009)  Chol:200 (01/28/2010), 175 (10/03/2009)  Trig:104.0 (01/28/2010), 125.0 (10/03/2009)  Complete Medication List: 1)  Hydrochlorothiazide 25 Mg Tabs (Hydrochlorothiazide) .... Take 1 tab by mouth every morning 2)  Simvastatin 40 Mg Tabs (Simvastatin) .... One by mouth daily 3)  Aspirin 81 Mg Tbec (Aspirin) .... One by mouth every day 4)  Nitroglycerin 0.3 Mg Subl (Nitroglycerin) .[name].. 1 sublingual every 5 minutes as needed for chest pain up to 3 doses daily 5)  Hydrocodone-acetaminophen 5-325 Mg Tabs (Hydrocodone-acetaminophen) .... Take 1 tablet by mouth two times a day as needed for foot pain  Patient Instructions: 1)  Please schedule a follow-up appointment in 4 months. 2)  labs one week prior to visit 3)  lipids---272.4 4)  lfts-995.2 5)  bmet-995.2 6)  A1C-250.02 7)     Prescriptions: HYDROCODONE-ACETAMINOPHEN 5-325 MG TABS (HYDROCODONE-ACETAMINOPHEN) Take 1 tablet by mouth two times a day as needed for foot pain  #40 x 2   Entered and Authorized by:   Birdie Sons MD   Signed by:   Birdie Sons MD on 02/04/2010   Method used:   Print then Give to Patient   RxID:   [id number]

## 2011-01-21 NOTE — Assessment & Plan Note (Signed)
Summary: SURGICAL CLEARANCE (KNEE REPLACEMENT) // RS   Vital Signs:  Patient profile:   70 year old male Weight:      175 pounds Temp:     98.8 degrees F oral Pulse rate:   72 / minute BP sitting:   160 / 70  (right arm) Cuff size:   regular  Vitals Entered By: Romualdo Bolk, CMA (AAMA) (September 11, 2010 8:04 AM) CC: Left testical pain on Cipro 500mg    CC:  Left testical pain on Cipro 500mg .  History of Present Illness: preop clearance endstage OA knee  Current Problems:  DIABETES MELLITUS, TYPE II (ICD-250.00)---not having any trouble HYPERTENSION (ICD-401.9)---BPs at hospital as high as 185/70 HYPERLIPIDEMIA (ICD-272.4)---tolerating meds  All other systems reviewed and were negative     Preventive Screening-Counseling & Management  Alcohol-Tobacco     Smoking Status: never  Current Problems (verified): 1)  Preoperative Examination  (ICD-V72.84) 2)  Popliteal Cyst  (ICD-727.51) 3)  Osteoarthrosis Unspec Whether Gen/loc Lower Leg  (ICD-715.96) 4)  Diabetes Mellitus, Type II  (ICD-250.00) 5)  Well Adult Exam  (ICD-V70.0) 6)  Hypertension  (ICD-401.9) 7)  Hyperlipidemia  (ICD-272.4)  Current Medications (verified): 1)  Lisinopril-Hydrochlorothiazide 20-12.5 Mg Tabs (Lisinopril-Hydrochlorothiazide) .... 2 By Mouth Once Daily (Take Them At Same Time) 2)  Simvastatin 40 Mg Tabs (Simvastatin) .... One By Mouth Daily 3)  Aspirin 81 Mg  Tbec (Aspirin) .... One By Mouth Every Day 4)  Nitroglycerin 0.3 Mg Subl (Nitroglycerin) .[name].. 1 Sublingual Every 5 Minutes As Needed For Chest Pain Up To 3 Doses Daily 5)  Oxycodone-Acetaminophen 5-325 Mg Tabs (Oxycodone-Acetaminophen) .[name].. 1 Tab By Mouth Q 6 Hour As Needed Pain 6)  Meloxicam 15 Mg Tabs (Meloxicam) .[name].. 1 Tab By Mouth Q Am As Needed Pain 7)  Cipro 500 Mg Tabs (Ciprofloxacin Hcl)  Allergies (verified): 1)  ! Micardis (Telmisartan)  Physical Exam  General:  alert and well-developed.   Head:  normocephalic and  atraumatic.   Eyes:  pupils equal and pupils round.   Ears:  R ear normal and L ear normal.   Neck:  No deformities, masses, or tenderness noted. Lungs:  normal respiratory effort and no intercostal retractions.   Heart:  normal rate and regular rhythm.   Abdomen:  soft and non-tender.   Skin:  turgor normal and color normal.   Psych:  good eye contact and not anxious appearing.     Impression & Recommendations:  Problem # 1:  HYPERTENSION (ICD-401.9) still needs to be better add labetolol side effects discussed see me monday His updated medication list for this problem includes:    Lisinopril-hydrochlorothiazide 20-12.5 Mg Tabs (Lisinopril-hydrochlorothiazide) .[name]... 2 by mouth once daily (take them at same time)    Labetalol Hcl 200 Mg Tabs (Labetalol hcl) .[name]... 1 tab twice daily  BP today: 160/70 Prior BP: 158/88 (08/18/2010)  Labs Reviewed: K+: 3.8 (05/27/2010) Creat: : 1.0 (05/27/2010)   Chol: 189 (05/27/2010)   HDL: 40.90 (05/27/2010)   LDL: 125 (05/27/2010)   TG: 114.0 (05/27/2010)  Complete Medication List: 1)  Lisinopril-hydrochlorothiazide 20-12.5 Mg Tabs (Lisinopril-hydrochlorothiazide) .... 2 by mouth once daily (take them at same time) 2)  Simvastatin 40 Mg Tabs (Simvastatin) .... One by mouth daily 3)  Aspirin 81 Mg Tbec (Aspirin) .... One by mouth every day 4)  Nitroglycerin 0.3 Mg Subl (Nitroglycerin) .[name].. 1 sublingual every 5 minutes as needed for chest pain up to 3 doses daily 5)  Oxycodone-acetaminophen 5-325 Mg Tabs (Oxycodone-acetaminophen) .[name].. 1 tab  by mouth q 6 hour as needed pain 6)  Meloxicam 15 Mg Tabs (Meloxicam) .[name].. 1 tab by mouth q am as needed pain 7)  Cipro 500 Mg Tabs (Ciprofloxacin hcl) 8)  Labetalol Hcl 200 Mg Tabs (Labetalol hcl) .[name].. 1 tab twice daily  Patient Instructions: 1)  See me Monday 745 a.m. Prescriptions: LABETALOL HCL 200 MG TABS (LABETALOL HCL) 1 tab twice daily  #60 x 3   Entered and Authorized by:   Birdie Sons MD    Signed by:   Birdie Sons MD on 09/11/2010   Method used:   Electronically to        [facility] [address] #[id number]* (retail)       [address]       [place], [place]  [address]       Ph: [phone]       Fax: [phone]   RxID:   [id number]

## 2011-01-21 NOTE — Progress Notes (Signed)
Summary: surgical clearance  Phone Note Call from Patient Call back at Home Phone [phone]   Caller: Alexander Bergeron via VM Call For: Birdie Sons MD Summary of Call: Needs clearance for knee replacement with [facility]. Initial call taken by: Gladis Riffle, RN,  July 27, 2010 2:41 PM  Follow-up for Phone Call        Appt made for 8/30 for surgical clearance.  States surgery to be middle of next month. Follow-up by: Gladis Riffle, RN,  July 27, 2010 2:45 PM

## 2011-01-21 NOTE — Progress Notes (Signed)
Summary: REQ FOR APPT  Phone Note Call from Patient   Caller: Patient's wife [name])  [phone] Summary of Call: Pts wife called in to schedule an appt with Dr Cato Mulligan for med clearance  (pt will be having knee replacement surgery on 9/28)..... No appts available till 9/27.... Can you advise a date/time that pt can be worked into scheduled for med clearance so he can keep appt for total knee replacement surgery on 9/28?   Initial call taken by: Debbra Riding,  September 07, 2010 8:11 AM  Follow-up for Phone Call        this thursday or friday at 745 Follow-up by: Birdie Sons MD,  September 07, 2010 9:28 AM  Additional Follow-up for Phone Call Additional follow up Details #1::        Appt scheduled for pt on 9/23 at 7:45am.... Pt aware, pts wife took information.  Additional Follow-up by: Debbra Riding,  September 07, 2010 1:28 PM

## 2011-01-21 NOTE — Progress Notes (Signed)
Summary: Lost RX  Phone Note Call from Patient   Caller: Patient Reason for Call: Talk to Nurse Summary of Call: Patient was in one month ago and got an RX for Hydrocodone [facility]/[address]  This script was lost and patient wants to know if another can be called in. Initial call taken by: Everrett Coombe,  March 23, 2010 10:22 AM  Follow-up for Phone Call        ok Follow-up by: Birdie Sons MD,  March 23, 2010 11:53 AM  Additional Follow-up for Phone Call Additional follow up Details #1::        Rx done x1.  Pt aware. Additional Follow-up by: Gladis Riffle, RN,  March 23, 2010 5:01 PM    New/Updated Medications: HYDROCODONE-ACETAMINOPHEN 5-325 MG TABS (HYDROCODONE-ACETAMINOPHEN) Take 1 tablet by mouth two times a day as needed for foot pain--to last for 30 days Prescriptions: HYDROCODONE-ACETAMINOPHEN 5-325 MG TABS (HYDROCODONE-ACETAMINOPHEN) Take 1 tablet by mouth two times a day as needed for foot pain  #40 x 0   Entered by:   Gladis Riffle, RN   Authorized by:   Birdie Sons MD   Signed by:   Gladis Riffle, RN on 03/23/2010   Method used:   Telephoned to ...       [facility] [address] #[id number]* (retail)       [address] [place], [place]  [address]       Ph: [phone]       Fax: [phone]   RxID:   [id number]  Rx to last 30 days.

## 2011-01-21 NOTE — Assessment & Plan Note (Signed)
Summary: 4 month rov/njr   Vital Signs:  Patient profile:   70 year old male Weight:      178 pounds Temp:     98.5 degrees F oral Pulse rate:   60 / minute Pulse rhythm:   regular Resp:     12 per minute BP sitting:   144 / 78  (left arm) Cuff size:   regular  Vitals Entered By: Gladis Riffle, RN (June 03, 2010 8:38 AM) CC: 4 month rov, labs done-- Is Patient Diabetic? Yes Did you bring your meter with you today? No Comments does not check CBGs at home   CC:  4 month rov and labs done--.  History of Present Illness:  Follow-Up Visit      This is a 70 year old man who presents for Follow-up visit.  The patient denies chest pain and palpitations.  Since the last visit the patient notes no new problems or concerns.  The patient reports taking meds as prescribed.  When questioned about possible medication side effects, the patient notes none.    All other systems reviewed and were negative   Preventive Screening-Counseling & Management  Alcohol-Tobacco     Smoking Status: never  Current Problems (verified): 1)  Diabetes Mellitus, Type II  (ICD-250.00) 2)  Well Adult Exam  (ICD-V70.0) 3)  Hypertension  (ICD-401.9) 4)  Hyperlipidemia  (ICD-272.4)  Current Medications (verified): 1)  Hydrochlorothiazide 25 Mg  Tabs (Hydrochlorothiazide) .... Take 1 Tab By Mouth Every Morning 2)  Simvastatin 40 Mg Tabs (Simvastatin) .... One By Mouth Daily 3)  Aspirin 81 Mg  Tbec (Aspirin) .... One By Mouth Every Day 4)  Nitroglycerin 0.3 Mg Subl (Nitroglycerin) .[name].. 1 Sublingual Every 5 Minutes As Needed For Chest Pain Up To 3 Doses Daily 5)  Hydrocodone-Acetaminophen 5-325 Mg Tabs (Hydrocodone-Acetaminophen) .... Take 1 Tablet By Mouth Two Times A Day As Needed For Foot Pain--To Last For 30 Days  Allergies: 1)  ! Micardis (Telmisartan)  Past History:  Past Medical History: Last updated: 06/19/2007 Hyperlipidemia Hypertension  Family History: Last updated: 07/11/2007 mother  decreased 40s unknown cause father deceased 80s unknown cause ? brain tumor  Social History: Last updated: 07/11/2007 Married Never Smoked 4 kids healthy Occupation: Holiday representative  Risk Factors: Smoking Status: never (06/03/2010)  Physical Exam  General:  alert and well-developed.   Head:  normocephalic and atraumatic.   Eyes:  pupils equal and pupils round.   Ears:  R ear normal and L ear normal.   Neck:  No deformities, masses, or tenderness noted. Lungs:  normal respiratory effort and no intercostal retractions.   Heart:  Normal rate and regular rhythm. S1 and S2 normal without gallop, murmur, click, rub or other extra sounds. Abdomen:  Bowel sounds positive,abdomen soft and non-tender without masses, organomegaly or hernias noted. Msk:  No deformity or scoliosis noted of thoracic or lumbar spine.   Neurologic:  cranial nerves II-XII intact and gait normal.     Impression & Recommendations:  Problem # 1:  DIABETES MELLITUS, TYPE II (ICD-250.00) controlled continue current medications  His updated medication list for this problem includes:    Lisinopril-hydrochlorothiazide 20-25 Mg Tabs (Lisinopril-hydrochlorothiazide) .[name]... Take 1 tab by mouth daily    Aspirin 81 Mg Tbec (Aspirin) ..... One by mouth every day  Labs Reviewed: Creat: 1.0 (05/27/2010)     Last Eye Exam: normal-pt's report (08/20/2009) Reviewed HgBA1c results: 5.9 (05/27/2010)  6.1 (01/28/2010)  Problem # 2:  HYPERTENSION (ICD-401.9) not as well controlled  change meds  His updated medication list for this problem includes:    Lisinopril-hydrochlorothiazide 20-25 Mg Tabs (Lisinopril-hydrochlorothiazide) .[name]... Take 1 tab by mouth daily  BP today: 144/78 Prior BP: 110/78 (02/04/2010)  Labs Reviewed: K+: 3.8 (05/27/2010) Creat: : 1.0 (05/27/2010)   Chol: 189 (05/27/2010)   HDL: 40.90 (05/27/2010)   LDL: 125 (05/27/2010)   TG: 114.0 (05/27/2010)  Problem # 3:  HYPERLIPIDEMIA (ICD-272.4) reasonable  control continue current medications  His updated medication list for this problem includes:    Simvastatin 40 Mg Tabs (Simvastatin) ..... One by mouth daily  Labs Reviewed: SGOT: 19 (05/27/2010)   SGPT: 16 (05/27/2010)   HDL:40.90 (05/27/2010), 43.40 (01/28/2010)  LDL:125 (05/27/2010), 136 (01/28/2010)  Chol:189 (05/27/2010), 200 (01/28/2010)  Trig:114.0 (05/27/2010), 104.0 (01/28/2010)  Complete Medication List: 1)  Lisinopril-hydrochlorothiazide 20-25 Mg Tabs (Lisinopril-hydrochlorothiazide) .... Take 1 tab by mouth daily 2)  Simvastatin 40 Mg Tabs (Simvastatin) .... One by mouth daily 3)  Aspirin 81 Mg Tbec (Aspirin) .... One by mouth every day 4)  Nitroglycerin 0.3 Mg Subl (Nitroglycerin) .[name].. 1 sublingual every 5 minutes as needed for chest pain up to 3 doses daily 5)  Hydrocodone-acetaminophen 5-325 Mg Tabs (Hydrocodone-acetaminophen) .... Take 1 tablet by mouth two times a day as needed for foot pain--to last for 30 days  Patient Instructions: 1)  Please schedule a follow-up appointment in 4 months. 2)  labs one week prior to visit 3)  lipids---272.4 4)  lfts-995.2 5)  bmet-995.2 6)  A1C-250.02 7)     Prescriptions: NITROGLYCERIN 0.3 MG SUBL (NITROGLYCERIN) 1 sublingual every 5 minutes as needed for chest pain up to 3 doses daily  #50 x 3   Entered and Authorized by:   Birdie Sons MD   Signed by:   Birdie Sons MD on 06/03/2010   Method used:   Electronically to        [facility] [address] #[id number]* (retail)       [address] [place], [place]  [address]       Ph: [phone]       Fax: [phone]   RxID:   [id number] SIMVASTATIN 40 MG TABS (SIMVASTATIN) one by mouth daily  #90 Each x 2   Entered and Authorized by:   Birdie Sons MD   Signed by:   Birdie Sons MD on 06/03/2010   Method used:   Electronically to        [facility] [address] #[id number]* (retail)       [address] [place], [place]  [address]       Ph: [phone]       Fax:  [phone]   RxID:   [id number] LISINOPRIL-HYDROCHLOROTHIAZIDE 20-25 MG  TABS (LISINOPRIL-HYDROCHLOROTHIAZIDE) Take 1 tab by mouth daily  #90 x 3   Entered and Authorized by:   Birdie Sons MD   Signed by:   Birdie Sons MD on 06/03/2010   Method used:   Electronically to        [facility] [address] #[id number]* (retail)       [address] [place], [place]  [address]       Ph: [phone]       Fax: [phone]   RxID:   [id number]

## 2011-01-21 NOTE — Assessment & Plan Note (Signed)
Summary: referrral for arthritis?????/dm   Vital Signs:  Patient profile:   70 year old male Height:      67 inches (170.18 cm) Weight:      178 pounds (80.91 kg) O2 Sat:      94 % on Room air Temp:     98.7 degrees F (37.06 degrees C) oral Pulse rate:   74 / minute BP sitting:   124 / 72  (right arm) Cuff size:   regular  Vitals Entered By: Josph Macho RMA (July 06, 2010 2:00 PM)  O2 Flow:  Room air CC: Referral for arthritis/ CF   History of Present Illness: Patient in today c/o worsening b/l knee pain. Has a known history of OA, moderate in both knees but recently the pain has been getting worse. Last week he was walking and he had a sharp pain in the backs of both knees which took him to the ground and he then had trouble getting back up again. It happened again so they presented to [facility] where evaluation confirmed OA but also found b/l Baker's Cysts. He had previously been seen by Sports medicine, he describes shots being done serially that were likely Synvisc. He did not find them helpful. He tried Vicodin and did not find it helpful has beenusing Percocet at bedtime and Motrin during the day and getting adequate relief but is ready to have further evaluation. He is here today with his wife and young grand kid and his wife believes he needs further treatment. He has not had any difficulty with the Percocet, such as constipation or excessive sedation. No recent illness/f/c/CP/palp/SOB/GI c/o.  Current Medications (verified): 1)  Lisinopril-Hydrochlorothiazide 20-25 Mg  Tabs (Lisinopril-Hydrochlorothiazide) .... Take 1 Tab By Mouth Daily 2)  Simvastatin 40 Mg Tabs (Simvastatin) .... One By Mouth Daily 3)  Aspirin 81 Mg  Tbec (Aspirin) .... One By Mouth Every Day 4)  Nitroglycerin 0.3 Mg Subl (Nitroglycerin) .[name].. 1 Sublingual Every 5 Minutes As Needed For Chest Pain Up To 3 Doses Daily 5)  Hydrocodone-Acetaminophen 5-325 Mg Tabs (Hydrocodone-Acetaminophen) .... Take  1 Tablet By Mouth Two Times A Day As Needed For Foot Pain--To Last For 30 Days  Allergies (verified): 1)  ! Micardis (Telmisartan)  Past History:  Past medical history reviewed for relevance to current acute and chronic problems. Social history (including risk factors) reviewed for relevance to current acute and chronic problems.  Past Medical History: Reviewed history from 06/19/2007 and no changes required. Hyperlipidemia Hypertension  Social History: Reviewed history from 07/11/2007 and no changes required. Married Never Smoked 4 kids healthy Occupation: Holiday representative  Review of Systems      See HPI  Physical Exam  General:  Well-developed,well-nourished,in no acute distress; alert,appropriate and cooperative throughout examination Head:  Normocephalic and atraumatic without obvious abnormalities Neck:  No deformities, masses, or tenderness noted. Lungs:  Normal respiratory effort, chest expands symmetrically. Lungs are clear to auscultation, no crackles or wheezes. Heart:  Normal rate and regular rhythm. S1 and S2 normal without gallop, murmur, click, rub or other extra sounds. Abdomen:  Bowel sounds positive,abdomen soft and non-tender without masses, organomegaly or hernias noted. Msk:  No deformity or scoliosis noted of thoracic or lumbar spine.   Pulses:  R and L carotid,dorsalis pedis and posterior tibial pulses are full and equal bilaterally Extremities:  B/L with mild swelling posteriorly, no ligamentous laxity of joint line tenderness. No warmth, erythema Neurologic:  No cranial nerve deficits noted. Station and gait are normal.  Plantar reflexes are down-going bilaterally. DTRs are symmetrical throughout. Sensory, motor and coordinative functions appear intact. Skin:  Intact without suspicious lesions or rashes Cervical Nodes:  No lymphadenopathy noted Psych:  Cognition and judgment appear intact. Alert and cooperative with normal attention span and concentration. No  apparent delusions, illusions, hallucinations   Impression & Recommendations:  Problem # 1:  OSTEOARTHROSIS UNSPEC WHETHER GEN/LOC LOWER LEG (ICD-715.96)  The following medications were removed from the medication list:    Hydrocodone-acetaminophen 5-325 Mg Tabs (Hydrocodone-acetaminophen) .[name]... Take 1 tablet by mouth two times a day as needed for foot pain--to last for 30 days His updated medication list for this problem includes:    Aspirin 81 Mg Tbec (Aspirin) ..... One by mouth every day    Oxycodone-acetaminophen 5-325 Mg Tabs (Oxycodone-acetaminophen)    Meloxicam 15 Mg Tabs (Meloxicam) .[name]... 1 tab by mouth q am as needed pain  Orders: Orthopedic Referral (Ortho) referred for further management of OA and Baker's Cysts D/C Motrin use  Problem # 2:  HYPERTENSION (ICD-401.9)  His updated medication list for this problem includes:    Lisinopril-hydrochlorothiazide 20-25 Mg Tabs (Lisinopril-hydrochlorothiazide) .[name]... Take 1 tab by mouth daily Well controlled  Complete Medication List: 1)  Lisinopril-hydrochlorothiazide 20-25 Mg Tabs (Lisinopril-hydrochlorothiazide) .... Take 1 tab by mouth daily 2)  Simvastatin 40 Mg Tabs (Simvastatin) .... One by mouth daily 3)  Aspirin 81 Mg Tbec (Aspirin) .... One by mouth every day 4)  Nitroglycerin 0.3 Mg Subl (Nitroglycerin) .[name].. 1 sublingual every 5 minutes as needed for chest pain up to 3 doses daily 5)  Oxycodone-acetaminophen 5-325 Mg Tabs (Oxycodone-acetaminophen) 6)  Meloxicam 15 Mg Tabs (Meloxicam) .[name].. 1 tab by mouth q am as needed pain  Patient Instructions: 1)  Please schedule a follow-up appointment as needed if symptoms worsen or do not improve. 2)  Will refer to [facility] for further management of worsening arthritis and Baker's Cysts. Use Percocet only when able to stay home and not drive, watch for constipation and use stool softeners as needed 3)  Take Meloxicam in am prn Prescriptions: MELOXICAM 15 MG TABS (MELOXICAM) 1  tab by mouth q am as needed pain  #30 x 2   Entered and Authorized by:   Danise Edge MD   Signed by:   Danise Edge MD on 07/06/2010   Method used:   Print then Give to Patient   RxID:   [id number] OXYCODONE-ACETAMINOPHEN 5-325 MG TABS (OXYCODONE-ACETAMINOPHEN)   #120 x 0   Entered and Authorized by:   Danise Edge MD   Signed by:   Danise Edge MD on 07/06/2010   Method used:   Print then Give to Patient   RxID:   [id number]

## 2011-01-21 NOTE — Assessment & Plan Note (Signed)
Summary: PT/RCD   Nurse Visit   Allergies: 1)  ! Micardis (Telmisartan) Laboratory Results   Blood Tests   Date/Time Received: October 12, 2010 10:12 AM  Date/Time Reported: October 12, 2010 10:12 AM    INR: 2.3   (Normal Range: 0.88-1.12   Therap INR: 2.0-3.5) Comments: Wynona Canes, CMA  October 12, 2010 10:12 AM     Orders Added: 1)  Est. Patient Level I [99211] 2)  Protime [04540JW]  Laboratory Results   Blood Tests      INR: 2.3   (Normal Range: 0.88-1.12   Therap INR: 2.0-3.5) Comments: Wynona Canes, CMA  October 12, 2010 10:12 AM       ANTICOAGULATION RECORD PREVIOUS REGIMEN & LAB RESULTS Anticoagulation Diagnosis:  V58.83,V58.61,453.41 on  10/09/2010 Previous INR Goal Range:  2.0-3.0 on  10/09/2010 Previous INR:  1.3 on  10/09/2010 Previous Coumadin Dose(mg):  10mg  qd on  10/09/2010 Previous Regimen:  10mg  qd on  10/09/2010  NEW REGIMEN & LAB RESULTS Anticoag. Dx: V58.83,V58.61,V45.4 Current INR: 2.3 Regimen: 10mg  qd  (no change)       Repeat testing in: Friday MEDICATIONS LISINOPRIL-HYDROCHLOROTHIAZIDE 20-12.5 MG TABS (LISINOPRIL-HYDROCHLOROTHIAZIDE) 2 by mouth once daily (take them at same time) SIMVASTATIN 40 MG TABS (SIMVASTATIN) one by mouth daily ASPIRIN 81 MG  TBEC (ASPIRIN) one by mouth every day NITROGLYCERIN 0.3 MG SUBL (NITROGLYCERIN) 1 sublingual every 5 minutes as needed for chest pain up to 3 doses daily OXYCODONE-ACETAMINOPHEN 5-325 MG TABS (OXYCODONE-ACETAMINOPHEN) 1 tab by mouth q 6 hour as needed pain MELOXICAM 15 MG TABS (MELOXICAM) 1 tab by mouth q am as needed pain CIPRO 500 MG TABS (CIPROFLOXACIN HCL)  LABETALOL HCL 200 MG TABS (LABETALOL HCL) 1 tab twice daily   Anticoagulation Visit Questionnaire      Coumadin dose missed/changed:  No      Abnormal Bleeding Symptoms:  No   Any diet changes including alcohol intake, vegetables or greens since the last visit:  No Any illnesses or hospitalizations since the  last visit:  No Any signs of clotting since the last visit (including chest discomfort, dizziness, shortness of breath, arm tingling, slurred speech, swelling or redness in leg):  No

## 2011-01-21 NOTE — Assessment & Plan Note (Signed)
Summary: 1 day fup @ 7:45 am per dr//ccm   Vital Signs:  Patient profile:   70 year old male BP sitting:   145 / 85  History of Present Illness:  Follow-Up Visit      This is a 70 year old man who presents for Follow-up visit.  The patient denies chest pain and palpitations.  Since the last visit the patient notes no new problems or concerns.  The patient reports taking meds as prescribed.    All other systems reviewed and were negative except right knee pain  Allergies: 1)  ! Micardis (Telmisartan)  Past History:  Past Medical History: Last updated: 06/19/2007 Hyperlipidemia Hypertension  Past Surgical History: Last updated: 08/18/2010 Denies surgical history  Family History: Last updated: 07/11/2007 mother decreased 40s unknown cause father deceased 1s unknown cause ? brain tumor  Social History: Last updated: 07/11/2007 Married Never Smoked 4 kids healthy Occupation: Holiday representative  Risk Factors: Smoking Status: never (09/11/2010)  Physical Exam  General:  alert and well-developed.   Head:  normocephalic and atraumatic.   Ears:  R ear normal and L ear normal.   Neck:  supple and full ROM.   Lungs:  normal respiratory effort and no intercostal retractions.   Heart:  normal rate, regular rhythm, and no murmur.     Impression & Recommendations:  Problem # 1:  HYPERTENSION (ICD-401.9) Assessment Improved BP much better continue current medications  he is ok for surgery.  His updated medication list for this problem includes:    Lisinopril-hydrochlorothiazide 20-12.5 Mg Tabs (Lisinopril-hydrochlorothiazide) .[name]... 2 by mouth once daily (take them at same time)    Labetalol Hcl 200 Mg Tabs (Labetalol hcl) .[name]... 1 tab twice daily   Labs Reviewed: K+: 3.8 (05/27/2010) Creat: : 1.0 (05/27/2010)   Chol: 189 (05/27/2010)   HDL: 40.90 (05/27/2010)   LDL: 125 (05/27/2010)   TG: 114.0 (05/27/2010)  Complete Medication List: 1)  Lisinopril-hydrochlorothiazide  20-12.5 Mg Tabs (Lisinopril-hydrochlorothiazide) .... 2 by mouth once daily (take them at same time) 2)  Simvastatin 40 Mg Tabs (Simvastatin) .... One by mouth daily 3)  Aspirin 81 Mg Tbec (Aspirin) .... One by mouth every day 4)  Nitroglycerin 0.3 Mg Subl (Nitroglycerin) .[name].. 1 sublingual every 5 minutes as needed for chest pain up to 3 doses daily 5)  Oxycodone-acetaminophen 5-325 Mg Tabs (Oxycodone-acetaminophen) .[name].. 1 tab by mouth q 6 hour as needed pain 6)  Meloxicam 15 Mg Tabs (Meloxicam) .[name].. 1 tab by mouth q am as needed pain 7)  Cipro 500 Mg Tabs (Ciprofloxacin hcl) 8)  Labetalol Hcl 200 Mg Tabs (Labetalol hcl) .[name].. 1 tab twice daily

## 2011-01-21 NOTE — Progress Notes (Signed)
       New/Updated Medications: OMEPRAZOLE 20 MG CPDR (OMEPRAZOLE) one by mouth two times a day x 10 days. AMOXICILLIN 500 MG CAPS (AMOXICILLIN) 2 by mouth two times a day x 10 days CLARITHROMYCIN 500 MG TABS (CLARITHROMYCIN) one by mouth two times a day x 10 days Prescriptions: CLARITHROMYCIN 500 MG TABS (CLARITHROMYCIN) one by mouth two times a day x 10 days  #20 x 0   Entered by:   Lynann Beaver CMA AAMA   Authorized by:   Birdie Sons MD   Signed by:   Lynann Beaver CMA AAMA on 11/19/2010   Method used:   Electronically to        [facility] [address] (retail)       [phone]       [place], [place]  [address]       Ph: [phone]       Fax: [phone]   RxID:   [id number] AMOXICILLIN 500 MG CAPS (AMOXICILLIN) 2 by mouth two times a day x 10 days  #20 x 0   Entered by:   Lynann Beaver CMA AAMA   Authorized by:   Birdie Sons MD   Signed by:   Lynann Beaver CMA AAMA on 11/19/2010   Method used:   Electronically to        [facility] [address] (retail)       [phone]       [place], [place]  [address]       Ph: [phone]       Fax: [phone]   RxID:   [id number] OMEPRAZOLE 20 MG CPDR (OMEPRAZOLE) one by mouth two times a day x 10 days.  #20 x 0   Entered by:   Lynann Beaver CMA AAMA   Authorized by:   Birdie Sons MD   Signed by:   Lynann Beaver CMA AAMA on 11/19/2010   Method used:   Electronically to        [facility] [address] (retail)       [phone]       [place], [place]  [address]       Ph: [phone]       Fax: [phone]   RxID:   [id number]  Per Dr. Cato Mulligan

## 2011-01-22 NOTE — Letter (Signed)
Summary: [facility]  [facility]   Imported By: Maryln Gottron 08/05/2010 13:17:36  _____________________________________________________________________  External Attachment:    Type:   Image     Comment:   External Document

## 2011-02-15 ENCOUNTER — Ambulatory Visit (INDEPENDENT_AMBULATORY_CARE_PROVIDER_SITE_OTHER): Payer: PRIVATE HEALTH INSURANCE | Admitting: Internal Medicine

## 2011-02-15 VITALS — BP 124/78 | HR 82 | Temp 98.7°F | Resp 16 | Wt 184.0 lb

## 2011-02-15 DIAGNOSIS — H6120 Impacted cerumen, unspecified ear: Secondary | ICD-10-CM | POA: Insufficient documentation

## 2011-02-15 NOTE — Assessment & Plan Note (Signed)
Bilateral ears successfully irrigated. Pt notes significant improvement hearing. Re-examination reveals no cerumen obstruction/impaction, clear canals and nl intact TM's. Discussed prn use of otc ear cleaning kit.

## 2011-02-15 NOTE — Progress Notes (Signed)
  Subjective:    Patient ID: Tanner Griffin, male    DOB: Jan 23, 1941, 70 y.o.   MRN: [id number]  HPI Pt presents to clinic for evaluation of diminished hearing. Notes chronic intermittent episodes of decreased auditory acuity with most recent occurrence 3 days ago. Has been told in the past his ears had cerumen impaction. Denies recent illness, fever, chills, ear drainage, ear pain, nasal congestion or nasal drainage. Has attempted no medication or treatment. No alleviating or exacerbating factors. No other complaints.  Reviewed PMH, medications, and allergies.    Review of Systems See HPI     Objective:   Physical Exam  [nursing notereviewed. Constitutional: He appears well-developed and well-nourished. No distress.  HENT:  Head: Normocephalic and atraumatic.  Right Ear: No drainage or tenderness. Decreased hearing is noted.  Left Ear: No drainage or tenderness. Decreased hearing is noted.  Nose: Nose normal.  Mouth/Throat: Oropharynx is clear and moist. No oropharyngeal exudate.       Bilateral cerumen impaction.  Neck: Neck supple.  Lymphadenopathy:    He has no cervical adenopathy.  Neurological: He is alert.  Skin: Skin is warm and dry. No rash noted. He is not diaphoretic. No erythema.  Psychiatric: He has a normal mood and affect.          Assessment & Plan:

## 2011-03-04 LAB — COMPREHENSIVE METABOLIC PANEL
Albumin: 3.6 g/dL (ref 3.5–5.2)
Alkaline Phosphatase: 102 U/L (ref 39–117)
BUN: 15 mg/dL (ref 6–23)
CO2: 31 mEq/L (ref 19–32)
Chloride: 106 mEq/L (ref 96–112)
Creatinine, Ser: 0.95 mg/dL (ref 0.4–1.5)
GFR calc non Af Amer: >60 mL/min (ref >60)
Glucose, Bld: 158 mg/dL — ABNORMAL HIGH (ref 70–99)
Potassium: 4 mEq/L (ref 3.5–5.1)
Total Bilirubin: 0.9 mg/dL (ref 0.3–1.2)

## 2011-03-04 LAB — URINALYSIS, ROUTINE W REFLEX MICROSCOPIC
Bilirubin Urine: NEGATIVE
Glucose, UA: NEGATIVE mg/dL
Glucose, UA: NEGATIVE mg/dL
Ketones, ur: 15 mg/dL — AB
Ketones, ur: NEGATIVE mg/dL
Nitrite: POSITIVE — AB
Protein, ur: NEGATIVE mg/dL
Protein, ur: NEGATIVE mg/dL
pH: 6 (ref 5.0–8.0)
pH: 5.5 (ref 5.0–8.0)

## 2011-03-04 LAB — PROTIME-INR
INR: 1.83 — ABNORMAL HIGH (ref 0.00–1.49)
INR: 2.33 — ABNORMAL HIGH (ref 0.00–1.49)
Prothrombin Time: 21.3 seconds — ABNORMAL HIGH (ref 11.6–15.2)
Prothrombin Time: 25.7 seconds — ABNORMAL HIGH (ref 11.6–15.2)
Prothrombin Time: 13.1 seconds (ref 11.6–15.2)
Prothrombin Time: 15.6 seconds — ABNORMAL HIGH (ref 11.6–15.2)
Prothrombin Time: 18.3 seconds — ABNORMAL HIGH (ref 11.6–15.2)

## 2011-03-04 LAB — SURGICAL PCR SCREEN: MRSA, PCR: NEGATIVE

## 2011-03-04 LAB — CBC
HCT: 39.7 % (ref 39.0–52.0)
Hemoglobin: 13.4 g/dL (ref 13.0–17.0)
MCH: 32.2 pg (ref 26.0–34.0)
MCV: 95.7 fL (ref 78.0–100.0)
Platelets: 305 10*3/uL (ref 150–400)
RBC: 4.15 MIL/uL — ABNORMAL LOW (ref 4.22–5.81)
WBC: 6.7 10*3/uL (ref 4.0–10.5)

## 2011-03-04 LAB — TYPE AND SCREEN: ABO/RH(D): B POS

## 2011-03-04 LAB — HEMOGLOBIN AND HEMATOCRIT, BLOOD
HCT: 27 % — ABNORMAL LOW (ref 39.0–52.0)
HCT: 29.5 % — ABNORMAL LOW (ref 39.0–52.0)
HCT: 30.8 % — ABNORMAL LOW (ref 39.0–52.0)
Hemoglobin: 9.2 g/dL — ABNORMAL LOW (ref 13.0–17.0)
Hemoglobin: 9.8 g/dL — ABNORMAL LOW (ref 13.0–17.0)

## 2011-03-04 LAB — DIFFERENTIAL
Basophils Absolute: 0 10*3/uL (ref 0.0–0.1)
Basophils Relative: 1 % (ref 0–1)
Lymphocytes Relative: 34 % (ref 12–46)
Monocytes Absolute: 0.5 10*3/uL (ref 0.1–1.0)
Neutro Abs: 3.7 10*3/uL (ref 1.7–7.7)

## 2011-03-04 LAB — URINE MICROSCOPIC-ADD ON

## 2011-03-04 LAB — GLUCOSE, CAPILLARY: Glucose-Capillary: 124 mg/dL — ABNORMAL HIGH (ref 70–99)

## 2011-03-04 LAB — ABO/RH: ABO/RH(D): B POS

## 2011-03-04 LAB — URINE CULTURE: Culture  Setup Time: 201110021759

## 2011-03-04 LAB — APTT: aPTT: 31 seconds (ref 24–37)

## 2011-03-10 ENCOUNTER — Ambulatory Visit: Payer: Self-pay | Admitting: Internal Medicine

## 2011-04-07 ENCOUNTER — Encounter: Payer: Self-pay | Admitting: Internal Medicine

## 2011-04-07 ENCOUNTER — Ambulatory Visit (INDEPENDENT_AMBULATORY_CARE_PROVIDER_SITE_OTHER): Payer: PRIVATE HEALTH INSURANCE | Admitting: Internal Medicine

## 2011-04-07 DIAGNOSIS — I10 Essential (primary) hypertension: Secondary | ICD-10-CM

## 2011-04-07 DIAGNOSIS — E785 Hyperlipidemia, unspecified: Secondary | ICD-10-CM

## 2011-04-07 MED ORDER — AMLODIPINE BESYLATE 5 MG PO TABS: 5.0000 mg | ORAL_TABLET | Freq: Every day | ORAL | Status: DC

## 2011-04-09 ENCOUNTER — Encounter: Payer: Self-pay | Admitting: Internal Medicine

## 2011-04-09 ENCOUNTER — Ambulatory Visit (INDEPENDENT_AMBULATORY_CARE_PROVIDER_SITE_OTHER): Payer: PRIVATE HEALTH INSURANCE | Admitting: Internal Medicine

## 2011-04-09 DIAGNOSIS — I10 Essential (primary) hypertension: Secondary | ICD-10-CM

## 2011-04-09 NOTE — Progress Notes (Signed)
  Subjective:    Patient ID: Tanner Griffin, male    DOB: [birth year], 70 y.o.   MRN: [id number]  HPI Patient presents to clinic for evaluation of hypertension. Because of recent suboptimal control blood pressure Norvasc was added 5 mg daily. Tolerates medication without adverse affect and denies leg swelling.Compliant with medication daily. Blood pressure reevaluated today 170/90.Remains a symptomatic without headache, dizziness, chest pain, shortness of breath or neurologic deficit.  Reviewed past medical history, medications and allergies   Review of Systemsee history of present illness     Objective:   Physical Exam  [nursing notereviewed. Constitutional: He appears well-developed and well-nourished. No distress.  HENT:  Head: Normocephalic and atraumatic.  Eyes: Conjunctivae are normal. No scleral icterus.  Neck: Carotid bruit is not present.  Cardiovascular: Normal rate, regular rhythm and normal heart sounds.  Exam reveals no gallop and no friction rub.   No murmur heard. Pulmonary/Chest: Effort normal and breath sounds normal. No respiratory distress. He has no wheezes. He has no rales.  Neurological: He is alert.  Skin: Skin is warm and dry. He is not diaphoretic.  Psychiatric: He has a normal mood and affect.          Assessment & Plan:

## 2011-04-10 NOTE — Assessment & Plan Note (Signed)
suboptimal control. Asymptomatic. We'll continue current dose of Norvasc.Schedule close followup and if remains suboptimal titrate medication further.

## 2011-04-10 NOTE — Assessment & Plan Note (Signed)
suboptimal control. Asymptomatic. Begin Norvasc 5 mg daily.Discussed possible side effects including leg swelling. Scheduled for close followup in 48 hours or sooner if necessary.

## 2011-04-10 NOTE — Progress Notes (Signed)
  Subjective:    Patient ID: Tanner Griffin, male    DOB: Nov 19, 1941, 70 y.o.   MRN: [id number]  HPIPatient presents to clinic for evaluation of elevated blood pressure. Recently presented to his urologist office and was told his blood pressure was significantly elevated. Blood pressure triage today 200/108. Rechecked behind and 70/94. Is asymptomatic with regard to elevated blood pressure. Denies headache, dizziness, chest pain, shortness of breath, or neurologic deficit. Has history of hypertension and is compliant with his current medication without adverse effect.Has history of hyperlipidemia tolerating statin therapy without bile just or abnormal LFTs.No alleviating or exacerbating factors.No other complaints.  Reviewed past medical history, medications and allergies  Review of Systems see history of present illness     Objective:   Physical Exam    Physical Exam  Vitals reviewed. Constitutional:  appears well-developed and well-nourished. No distress.  HENT:  Head: Normocephalic and atraumatic.  Right Ear: Tympanic membrane, external ear and ear canal normal.  Left Ear: Tympanic membrane, external ear and ear canal normal.  Nose: Nose normal.  Mouth/Throat: Oropharynx is clear and moist. No oropharyngeal exudate.  Eyes: Conjunctivae and EOM are normal. Pupils are equal, round, and reactive to light. Right eye exhibits no discharge. Left eye exhibits no discharge. No scleral icterus.  Neck: Neck supple. No thyromegaly present.  Cardiovascular: Normal rate, regular rhythm and normal heart sounds.  Exam reveals no gallop and no friction rub.   No murmur heard. Pulmonary/Chest: Effort normal and breath sounds normal. No respiratory distress.  has no wheezes.  has no rales.  Lymphadenopathy:   no cervical adenopathy.  Neurological:  is alert.  Skin: Skin is warm and dry.  not diaphoretic.  Psychiatric: normal mood and affect.      Assessment & Plan:

## 2011-04-10 NOTE — Assessment & Plan Note (Signed)
Stable.With the addition of Norvasc will decrease his Zocor dose to 20mg  daily until evaluated by primary care physician

## 2011-04-14 ENCOUNTER — Ambulatory Visit (INDEPENDENT_AMBULATORY_CARE_PROVIDER_SITE_OTHER): Payer: PRIVATE HEALTH INSURANCE | Admitting: Internal Medicine

## 2011-04-14 ENCOUNTER — Encounter: Payer: Self-pay | Admitting: Internal Medicine

## 2011-04-14 DIAGNOSIS — I10 Essential (primary) hypertension: Secondary | ICD-10-CM

## 2011-04-14 DIAGNOSIS — M79609 Pain in unspecified limb: Secondary | ICD-10-CM

## 2011-04-14 DIAGNOSIS — M79671 Pain in right foot: Secondary | ICD-10-CM

## 2011-04-14 MED ORDER — AMLODIPINE BESYLATE 10 MG PO TABS: 10.0000 mg | ORAL_TABLET | Freq: Every day | ORAL | Status: DC

## 2011-04-17 ENCOUNTER — Encounter: Payer: Self-pay | Admitting: Internal Medicine

## 2011-04-17 DIAGNOSIS — M79604 Pain in right leg: Secondary | ICD-10-CM | POA: Insufficient documentation

## 2011-04-17 DIAGNOSIS — M25561 Pain in right knee: Secondary | ICD-10-CM | POA: Insufficient documentation

## 2011-04-17 NOTE — Progress Notes (Signed)
  Subjective:    Patient ID: Tanner Griffin, male    DOB: 1941/08/04, 70 y.o.   MRN: [id number]  HPI Patient presents to clinic for evaluation of hypertension. Recently placed on Norvasc 5 mg daily and tolerates without lower extremity swelling. Home blood pressures reviewed and predominately systolics between 150 180. Asymptomatic without headache dizziness chest painor neurologic deficit. Does complain of rightfifth metatarsal plantar pain without trauma or injury. States it occurred after his knee surgery. Is not worsening. Hurts with placing weight on the area but no other alleviating or exacerbating factors. Taking no medication for the problem. No other complaints.  Reviewed past medical history, medications and allergies    Review of Systems see history of present illness     Objective:   Physical Exam   Physical Exam  Vitals reviewed. Constitutional:  appears well-developed and well-nourished. No distress.  HENT:  Head: Normocephalic and atraumatic.  Right Ear: Tympanic membrane, external ear and ear canal normal.  Left Ear: Tympanic membrane, external ear and ear canal normal.  Nose: Nose normal.  Mouth/Throat: Oropharynx is clear and moist. No oropharyngeal exudate.  Eyes: Conjunctivae and EOM are normal. Pupils are equal, round, and reactive to light. Right eye exhibits no discharge. Left eye exhibits no discharge. No scleral icterus.  Neck: Neck supple. No thyromegaly present.  Cardiovascular: Normal rate, regular rhythm and normal heart sounds.  Exam reveals no gallop and no friction rub.   No murmur heard. Pulmonary/Chest: Effort normal and breath sounds normal. No respiratory distress.  has no wheezes.  has no rales.  Lymphadenopathy:   no cervical adenopathy.  Neurological:  is alert.  Skin: Skin is warm and dry.  not diaphoretic.  Psychiatric: normal mood and affect.  Musculoskeletal: examination right foot reveals no tenderness or bony abnormality. Minimal fat pad  at the fifth plantarmetatarsal.Gait normal. no wound or ulcerations    Assessment & Plan:

## 2011-04-17 NOTE — Assessment & Plan Note (Signed)
suboptimal control. Asymptomatic. Increase Norvasc 10 mg daily. Monitor blood pressure as an outpatient and followup in clinic as scheduled

## 2011-04-19 ENCOUNTER — Telehealth: Payer: Self-pay | Admitting: *Deleted

## 2011-04-19 NOTE — Telephone Encounter (Signed)
bp seems to be finally improving. Just increased norvasc dose 4/25. Would continue at 10mg  for now. If sbp doesn't decrease to less than 140 most of the time within 2wks let me know

## 2011-04-19 NOTE — Telephone Encounter (Signed)
BP readings  April 27:  180/96         28    147/78         29    138/71         30    158/78

## 2011-04-19 NOTE — Telephone Encounter (Signed)
Pt aware. Has appt with Dr. Cato Mulligan on 04/28/2011

## 2011-04-28 ENCOUNTER — Ambulatory Visit (INDEPENDENT_AMBULATORY_CARE_PROVIDER_SITE_OTHER): Payer: PRIVATE HEALTH INSURANCE | Admitting: Internal Medicine

## 2011-04-28 ENCOUNTER — Encounter: Payer: Self-pay | Admitting: Internal Medicine

## 2011-04-28 DIAGNOSIS — I10 Essential (primary) hypertension: Secondary | ICD-10-CM

## 2011-04-28 DIAGNOSIS — E119 Type 2 diabetes mellitus without complications: Secondary | ICD-10-CM

## 2011-04-28 LAB — HEPATIC FUNCTION PANEL: Albumin: 3.8 g/dL (ref 3.5–5.2)

## 2011-04-28 LAB — LIPID PANEL
Cholesterol: 151 mg/dL (ref 0–200)
HDL: 41 mg/dL (ref >39.00)
Triglycerides: 76 mg/dL (ref 0.0–149.0)

## 2011-04-28 LAB — BASIC METABOLIC PANEL
BUN: 16 mg/dL (ref 6–23)
Calcium: 9.5 mg/dL (ref 8.4–10.5)
Creatinine, Ser: 0.8 mg/dL (ref 0.4–1.5)
GFR: 123.61 mL/min (ref >60.00)
Glucose, Bld: 146 mg/dL — ABNORMAL HIGH (ref 70–99)

## 2011-04-28 LAB — HEMOGLOBIN A1C: Hgb A1c MFr Bld: 6 % (ref 4.6–6.5)

## 2011-04-28 NOTE — Assessment & Plan Note (Signed)
Needs f/u labs 

## 2011-04-28 NOTE — Progress Notes (Signed)
  Subjective:    Patient ID: Tanner Griffin, male    DOB: 04-03-1941, 70 y.o.   MRN: [id number]  HPI  HTN-home BPs 118-152//60-70s Tolerating new meds  DM---no home CBGs, currently no meds  Lipids---tolerating meds  No past medical history on file. No past surgical history on file.  reports that he has never smoked. He does not have any smokeless tobacco history on file. His alcohol and drug histories not on file. family history is not on file. Allergies  Allergen Reactions  . Telmisartan     REACTION: headache     Review of Systems  patient denies chest pain, shortness of breath, orthopnea. Denies lower extremity edema, abdominal pain, change in appetite, change in bowel movements. Patient denies rashes, musculoskeletal complaints. No other specific complaints in a complete review of systems.      Objective:   Physical Exam  well-developed well-nourished male in no acute distress. HEENT exam atraumatic, normocephalic, neck supple without jugular venous distention. Chest clear to auscultation cardiac exam S1-S2 are regular. Abdominal exam overweight with bowel sounds, soft and nontender. Extremities no edema. Neurologic exam is alert with a normal gait.        Assessment & Plan:

## 2011-04-28 NOTE — Assessment & Plan Note (Signed)
Reasonable control Continue same meds 

## 2011-05-14 ENCOUNTER — Other Ambulatory Visit: Payer: Self-pay | Admitting: Internal Medicine

## 2011-05-18 ENCOUNTER — Ambulatory Visit: Payer: Self-pay | Admitting: Internal Medicine

## 2011-06-25 ENCOUNTER — Other Ambulatory Visit: Payer: Self-pay | Admitting: Internal Medicine

## 2011-09-02 ENCOUNTER — Ambulatory Visit: Payer: PRIVATE HEALTH INSURANCE | Admitting: Internal Medicine

## 2011-09-13 ENCOUNTER — Encounter: Payer: Self-pay | Admitting: Internal Medicine

## 2011-09-13 ENCOUNTER — Ambulatory Visit (INDEPENDENT_AMBULATORY_CARE_PROVIDER_SITE_OTHER): Payer: PRIVATE HEALTH INSURANCE | Admitting: Internal Medicine

## 2011-09-13 DIAGNOSIS — I10 Essential (primary) hypertension: Secondary | ICD-10-CM

## 2011-09-13 DIAGNOSIS — E785 Hyperlipidemia, unspecified: Secondary | ICD-10-CM

## 2011-09-13 DIAGNOSIS — E119 Type 2 diabetes mellitus without complications: Secondary | ICD-10-CM

## 2011-09-13 DIAGNOSIS — Z23 Encounter for immunization: Secondary | ICD-10-CM

## 2011-09-13 LAB — HEPATIC FUNCTION PANEL
AST: 15 U/L (ref 0–37)
Alkaline Phosphatase: 167 U/L — ABNORMAL HIGH (ref 39–117)
Bilirubin, Direct: 0 mg/dL (ref 0.0–0.3)

## 2011-09-13 LAB — BASIC METABOLIC PANEL
BUN: 17 mg/dL (ref 6–23)
CO2: 26 mEq/L (ref 19–32)
Chloride: 105 mEq/L (ref 96–112)
Creatinine, Ser: 0.9 mg/dL (ref 0.4–1.5)
Glucose, Bld: 141 mg/dL — ABNORMAL HIGH (ref 70–99)

## 2011-09-13 LAB — LIPID PANEL
HDL: 50 mg/dL (ref >39.00)
Total CHOL/HDL Ratio: 3
VLDL: 21.2 mg/dL (ref 0.0–40.0)

## 2011-09-13 LAB — HEMOGLOBIN A1C: Hgb A1c MFr Bld: 5.7 % (ref 4.6–6.5)

## 2011-09-13 NOTE — Progress Notes (Signed)
  Subjective:    Patient ID: Tanner Griffin, male    DOB: 04/11/1941, 70 y.o.   MRN: [id number]  HPI   patient comes in for followup of multiple medical problems including type 2 diabetes, hyperlipidemia, hypertension. The patient does not check blood sugar or blood pressure at home. The patetient does not follow an exercise or diet program. The patient denies any polyuria, polydipsia.  In the past the patient has gone to diabetic treatment center. The patient is tolerating medications  Without difficulty. The patient does admit to medication compliance.   No past medical history on file. No past surgical history on file.  reports that he has never smoked. He does not have any smokeless tobacco history on file. His alcohol and drug histories not on file. family history is not on file. Allergies  Allergen Reactions  . Telmisartan     REACTION: headache    Review of Systems  patient denies chest pain, shortness of breath, orthopnea. Denies lower extremity edema, abdominal pain, change in appetite, change in bowel movements. Patient denies rashes, musculoskeletal complaints. No other specific complaints in a complete review of systems.      Objective:   Physical Exam  well-developed well-nourished male in no acute distress. HEENT exam atraumatic, normocephalic, neck supple without jugular venous distention. Chest clear to auscultation cardiac exam S1-S2 are regular. Abdominal exam overweight with bowel sounds, soft and nontender. Extremities no edema. Neurologic exam is alert with a normal gait.      Assessment & Plan:

## 2011-09-13 NOTE — Assessment & Plan Note (Signed)
Tolerating meds Check today

## 2011-09-13 NOTE — Patient Instructions (Signed)
Call your insurance company and ask about coverage for shingles shot.

## 2011-09-13 NOTE — Assessment & Plan Note (Signed)
Needs f/u labs Check today 

## 2011-09-13 NOTE — Assessment & Plan Note (Signed)
Fair control 130/90

## 2011-12-22 ENCOUNTER — Encounter: Payer: Self-pay | Admitting: Internal Medicine

## 2011-12-23 ENCOUNTER — Other Ambulatory Visit: Payer: Self-pay | Admitting: Internal Medicine

## 2012-02-17 ENCOUNTER — Telehealth: Payer: Self-pay | Admitting: *Deleted

## 2012-02-17 NOTE — Telephone Encounter (Signed)
Pt's wife called stating that her husband was seeing Dr.Zimmerman at [facility], and was told to call Dr. Cato Mulligan office for an appt to follow up on some abnormal liver functions.  Can be reached at [phone].

## 2012-02-17 NOTE — Telephone Encounter (Signed)
L/m on pts machine to call back and schedule an OV with Dr Cato Mulligan

## 2012-02-17 NOTE — Telephone Encounter (Signed)
Pt asking of a release of records before his CPX.  Call sent to [facility].

## 2012-03-02 ENCOUNTER — Telehealth: Payer: Self-pay | Admitting: Internal Medicine

## 2012-03-02 ENCOUNTER — Ambulatory Visit (INDEPENDENT_AMBULATORY_CARE_PROVIDER_SITE_OTHER)
Admission: RE | Admit: 2012-03-02 | Discharge: 2012-03-02 | Disposition: A | Discharge status: Home / Self Care | Payer: PRIVATE HEALTH INSURANCE | Source: Ambulatory Visit | Attending: Family | Admitting: Family

## 2012-03-02 ENCOUNTER — Encounter: Payer: Self-pay | Admitting: Family

## 2012-03-02 ENCOUNTER — Ambulatory Visit (INDEPENDENT_AMBULATORY_CARE_PROVIDER_SITE_OTHER): Payer: PRIVATE HEALTH INSURANCE | Admitting: Family

## 2012-03-02 ENCOUNTER — Telehealth: Payer: Self-pay

## 2012-03-02 DIAGNOSIS — E7800 Pure hypercholesterolemia, unspecified: Secondary | ICD-10-CM

## 2012-03-02 DIAGNOSIS — I10 Essential (primary) hypertension: Secondary | ICD-10-CM

## 2012-03-02 DIAGNOSIS — R079 Chest pain, unspecified: Secondary | ICD-10-CM

## 2012-03-02 DIAGNOSIS — E119 Type 2 diabetes mellitus without complications: Secondary | ICD-10-CM

## 2012-03-02 LAB — CK TOTAL AND CKMB (NOT AT ARMC): CK, MB: 3.4 ng/mL (ref 0.3–4.0)

## 2012-03-02 LAB — LIPID PANEL
Cholesterol: 173 mg/dL (ref 0–200)
LDL Cholesterol: 105 mg/dL — ABNORMAL HIGH (ref 0–99)
Triglycerides: 64 mg/dL (ref 0.0–149.0)
VLDL: 12.8 mg/dL (ref 0.0–40.0)

## 2012-03-02 LAB — BASIC METABOLIC PANEL
Calcium: 9.4 mg/dL (ref 8.4–10.5)
Creatinine, Ser: 1 mg/dL (ref 0.4–1.5)
GFR: 99.9 mL/min (ref >60.00)
Sodium: 142 mEq/L (ref 135–145)

## 2012-03-02 MED ORDER — DOXYCYCLINE HYCLATE 100 MG PO TABS: 100.0000 mg | ORAL_TABLET | Freq: Two times a day (BID) | ORAL | Status: DC

## 2012-03-02 NOTE — Telephone Encounter (Signed)
Message copied by Beverely Low on Thu Mar 02, 2012  3:36 PM ------      Message from: Adline Mango B      Created: Thu Mar 02, 2012 11:01 AM       Small pneumonia in the lungs. Doxycycline 100mg  BID x 10 days.

## 2012-03-02 NOTE — Telephone Encounter (Signed)
Spoke to pt re: chest pain.  He had begun with some pain last night, and is asymptomatic right now but is asking to be seen.  Okey Regal will ask Oran Rein about seeing the pt, and she will.  I also spoke to [place] re: pt.

## 2012-03-02 NOTE — Progress Notes (Signed)
Subjective:    Patient ID: Tanner Griffin, male    DOB: December 06, 1941, 71 y.o.   MRN: [id number]  HPI Comments: C/o intermittent, nonradiating, substernal chest pressure started Tues evening while sitting watching tv. Unable to describe pain, upon onset ranked 5/10. "Nothing made the pain better or worse, took one .4mg  ntg sl without relief." Stating was prescribed ntg years ago. Takes asa 81mg  daily.  Pain reportedly lasted throughout the evening and night and started again last pm. Denies pain at present. Denies nausea, vomiting, diaphoresis, dyspnea, or pain radiating to jaw, arms, or back. Nonsmoker. Reportedly has never seen cardiologist.   Chest Pain  Associated symptoms include back pain. Pertinent negatives include no palpitations.      Review of Systems  Constitutional: Negative.   HENT: Negative.   Eyes: Negative.   Respiratory: Negative.   Cardiovascular: Positive for chest pain. Negative for palpitations and leg swelling.  Gastrointestinal: Negative.   Musculoskeletal: Positive for back pain.  Skin: Negative for color change and pallor.  Neurological: Negative.  Negative for light-headedness.  Hematological: Negative.   Psychiatric/Behavioral: Negative.    No past medical history on file.  History   Social History  . Marital Status: Married    Spouse Name: N/A    Number of Children: N/A  . Years of Education: N/A   Occupational History  . Not on file.   Social History Main Topics  . Smoking status: Never Smoker   . Smokeless tobacco: Not on file  . Alcohol Use: Not on file  . Drug Use: Not on file  . Sexually Active: Not on file   Other Topics Concern  . Not on file   Social History Narrative  . No narrative on file    No past surgical history on file.  No family history on file.  Allergies  Allergen Reactions  . Telmisartan     REACTION: headache    Current Outpatient Prescriptions on File Prior to Visit  Medication Sig Dispense Refill  .  amLODipine (NORVASC) 10 MG tablet Take 1 tablet (10 mg total) by mouth daily.  30 tablet  11  . aspirin 81 MG tablet Take 81 mg by mouth daily.        [name] labetalol (NORMODYNE) 200 MG tablet TAKE 1 TABLET BY MOUTH TWICE DAILY  60 tablet  2  . simvastatin (ZOCOR) 40 MG tablet TAKE ONE TABLET BY MOUTH DAILY  90 tablet  0  . Tamsulosin HCl (FLOMAX) 0.4 MG CAPS Take 0.4 mg by mouth daily after supper.         BP 140/84  Temp(Src) 98.4 F (36.9 C) (Oral)  Ht 5\' 7"  (1.702 m)  Wt 188 lb (85.276 kg)  BMI 29.44 kg/m2chart     Objective:   Physical Exam  Constitutional: He is oriented to person, place, and time. He appears well-developed and well-nourished. No distress.  HENT:  Right Ear: External ear normal.  Left Ear: External ear normal.  Nose: Nose normal.  Eyes: Pupils are equal, round, and reactive to light.  Neck: Normal range of motion. Neck supple.  Cardiovascular: Normal rate, regular rhythm, normal heart sounds and intact distal pulses.  Exam reveals no gallop and no friction rub.   No murmur heard. Pulmonary/Chest: Effort normal and breath sounds normal. No respiratory distress. He has no wheezes. He has no rales. He exhibits no tenderness.  Abdominal: Soft. Bowel sounds are normal.  Musculoskeletal: Normal range of motion.  Neurological: He is alert and  oriented to person, place, and time.  Skin: Skin is warm and dry. He is not diaphoretic.  Psychiatric: He has a normal mood and affect.          Assessment & Plan:  Assessment: Chest pain, HTN, HLD, Type 2 DM  Plan: 12 lead EKG, cardiology consult, chest xray, labs: troponin, ck-mb, creactive protein. Teaching handouts provided and opportunity for questions provided. Encouraged to report to ED if experiences cp, dyspnea, diaphoresis, or nausea, or pain radiating to jaw/arms/or back.

## 2012-03-02 NOTE — Patient Instructions (Signed)
Chest Pain (Nonspecific) It is often hard to give a specific diagnosis for the cause of chest pain. There is always a chance that your pain could be related to something serious, such as a heart attack or a blood clot in the lungs. You need to follow up with your caregiver for further evaluation. CAUSES   Heartburn.   Pneumonia or bronchitis.   Anxiety or stress.   Inflammation around your heart (pericarditis) or lung (pleuritis or pleurisy).   A blood clot in the lung.   A collapsed lung (pneumothorax). It can develop suddenly on its own (spontaneous pneumothorax) or from injury (trauma) to the chest.   Shingles infection (herpes zoster virus).  The chest wall is composed of bones, muscles, and cartilage. Any of these can be the source of the pain.  The bones can be bruised by injury.   The muscles or cartilage can be strained by coughing or overwork.   The cartilage can be affected by inflammation and become sore (costochondritis).  DIAGNOSIS  Lab tests or other studies, such as X-rays, electrocardiography, stress testing, or cardiac imaging, may be needed to find the cause of your pain.  TREATMENT   Treatment depends on what may be causing your chest pain. Treatment may include:   Acid blockers for heartburn.   Anti-inflammatory medicine.   Pain medicine for inflammatory conditions.   Antibiotics if an infection is present.   You may be advised to change lifestyle habits. This includes stopping smoking and avoiding alcohol, caffeine, and chocolate.   You may be advised to keep your head raised (elevated) when sleeping. This reduces the chance of acid going backward from your stomach into your esophagus.   Most of the time, nonspecific chest pain will improve within 2 to 3 days with rest and mild pain medicine.  HOME CARE INSTRUCTIONS   If antibiotics were prescribed, take your antibiotics as directed. Finish them even if you start to feel better.   For the next few  days, avoid physical activities that bring on chest pain. Continue physical activities as directed.   Do not smoke.   Avoid drinking alcohol.   Only take over-the-counter or prescription medicine for pain, discomfort, or fever as directed by your caregiver.   Follow your caregiver's suggestions for further testing if your chest pain does not go away.   Keep any follow-up appointments you made. If you do not go to an appointment, you could develop lasting (chronic) problems with pain. If there is any problem keeping an appointment, you must call to reschedule.  SEEK MEDICAL CARE IF:   You think you are having problems from the medicine you are taking. Read your medicine instructions carefully.   Your chest pain does not go away, even after treatment.   You develop a rash with blisters on your chest.  SEEK IMMEDIATE MEDICAL CARE IF:   You have increased chest pain or pain that spreads to your arm, neck, jaw, back, or abdomen.   You develop shortness of breath, an increasing cough, or you are coughing up blood.   You have severe back or abdominal pain, feel nauseous, or vomit.   You develop severe weakness, fainting, or chills.   You have a fever.  THIS IS AN EMERGENCY. Do not wait to see if the pain will go away. Get medical help at once. Call your local emergency services (911 in [place] Do not drive yourself to the hospital. MAKE SURE YOU:   Understand these instructions.     Will watch your condition.   Will get help right away if you are not doing well or get worse.  Document Released: 09/15/2005 Document Revised: 11/25/2011 Document Reviewed: 07/11/2008 Exit[employer] Patient Information 2012 [employer]. 

## 2012-03-02 NOTE — Telephone Encounter (Signed)
Pt walked in to [facility] this morning, req an ov today re: chest pains. I went to Debbie in triage and informed her about the situation and Eunice Blase came out an accessed the pts condition. Pt said that the chest pains he had, started last night and that they were off and on. Pt stated that he was not hurting now. Triage advised to sch pt ov with Adline Mango. Asked Padonda if ok to sch and she agreed. Pt was sch ov for today at 9:45 am, in earliest time slot avail.

## 2012-03-02 NOTE — Telephone Encounter (Signed)
Left another message for pt to call back. Rx sent to pharmacy

## 2012-03-03 NOTE — Progress Notes (Signed)
Quick Note:  Pt aware ______ 

## 2012-03-06 ENCOUNTER — Other Ambulatory Visit: Payer: PRIVATE HEALTH INSURANCE

## 2012-03-13 ENCOUNTER — Ambulatory Visit (INDEPENDENT_AMBULATORY_CARE_PROVIDER_SITE_OTHER): Payer: PRIVATE HEALTH INSURANCE | Admitting: Internal Medicine

## 2012-03-13 ENCOUNTER — Encounter: Payer: Self-pay | Admitting: Internal Medicine

## 2012-03-13 VITALS — BP 142/80 | HR 76 | Temp 98.3°F | Ht 66.75 in | Wt 183.0 lb

## 2012-03-13 DIAGNOSIS — I798 Other disorders of arteries, arterioles and capillaries in diseases classified elsewhere: Secondary | ICD-10-CM

## 2012-03-13 DIAGNOSIS — Z0000 Encounter for general adult medical examination without abnormal findings: Secondary | ICD-10-CM

## 2012-03-13 DIAGNOSIS — N138 Other obstructive and reflux uropathy: Secondary | ICD-10-CM

## 2012-03-13 DIAGNOSIS — E1151 Type 2 diabetes mellitus with diabetic peripheral angiopathy without gangrene: Secondary | ICD-10-CM

## 2012-03-13 DIAGNOSIS — N401 Enlarged prostate with lower urinary tract symptoms: Secondary | ICD-10-CM

## 2012-03-13 DIAGNOSIS — N139 Obstructive and reflux uropathy, unspecified: Secondary | ICD-10-CM

## 2012-03-13 DIAGNOSIS — E1159 Type 2 diabetes mellitus with other circulatory complications: Secondary | ICD-10-CM

## 2012-03-13 DIAGNOSIS — E119 Type 2 diabetes mellitus without complications: Secondary | ICD-10-CM

## 2012-03-13 LAB — HEMOGLOBIN A1C: Hgb A1c MFr Bld: 5.9 % (ref 4.6–6.5)

## 2012-03-13 NOTE — Patient Instructions (Signed)
Call your insurance company and see if they will cover shingles vaccine. If they will, call us and we will give it to you  

## 2012-03-13 NOTE — Progress Notes (Signed)
Patient ID: Tanner Griffin, male   DOB: 08-25-1941, 71 y.o.   MRN: [id number] Cpx.  No past medical history on file.  History   Social History  . Marital Status: Married    Spouse Name: N/A    Number of Children: N/A  . Years of Education: N/A   Occupational History  . Not on file.   Social History Main Topics  . Smoking status: Never Smoker   . Smokeless tobacco: Not on file  . Alcohol Use: Not on file  . Drug Use: Not on file  . Sexually Active: Not on file   Other Topics Concern  . Not on file   Social History Narrative  . No narrative on file    No past surgical history on file.  No family history on file.  Allergies  Allergen Reactions  . Telmisartan     REACTION: headache    Current Outpatient Prescriptions on File Prior to Visit  Medication Sig Dispense Refill  . amLODipine (NORVASC) 10 MG tablet Take 1 tablet (10 mg total) by mouth daily.  30 tablet  11  . aspirin 81 MG tablet Take 81 mg by mouth daily.        [name] labetalol (NORMODYNE) 200 MG tablet TAKE 1 TABLET BY MOUTH TWICE DAILY  60 tablet  2  . simvastatin (ZOCOR) 40 MG tablet TAKE ONE TABLET BY MOUTH DAILY  90 tablet  0  . Tamsulosin HCl (FLOMAX) 0.4 MG CAPS Take 0.4 mg by mouth daily after supper.          patient denies chest pain, shortness of breath, orthopnea. Denies lower extremity edema, abdominal pain, change in appetite, change in bowel movements. Patient denies rashes, musculoskeletal complaints. No other specific complaints in a complete review of systems.   BP 142/80  Pulse 76  Temp(Src) 98.3 F (36.8 C) (Oral)  Ht 5' 6.75" (1.695 m)  Wt 183 lb (83.008 kg)  BMI 28.88 kg/m2 Well-developed male in no acute distress. HEENT exam atraumatic, normocephalic, extraocular muscles are intact. Conjunctivae are pink without exudate. Neck is supple without lymphadenopathy, thyromegaly, jugular venous distention. Chest is clear to auscultation without increased work of breathing. Cardiac exam S1-S2  are regular. The PMI is normal. No significant murmurs or gallops. Abdominal exam active bowel sounds, soft, nontender. No abdominal bruits. Extremities no clubbing cyanosis or edema. Peripheral pulses are normal without bruits. Neurologic exam alert and oriented without any motor or sensory deficits.   A/P - well visit: health maint UTD

## 2012-03-13 NOTE — Assessment & Plan Note (Signed)
Needs a1c check today

## 2012-03-29 ENCOUNTER — Encounter: Payer: Self-pay | Admitting: *Deleted

## 2012-03-30 ENCOUNTER — Ambulatory Visit (INDEPENDENT_AMBULATORY_CARE_PROVIDER_SITE_OTHER): Payer: PRIVATE HEALTH INSURANCE | Admitting: Cardiovascular Disease

## 2012-03-30 ENCOUNTER — Encounter: Payer: Self-pay | Admitting: Cardiovascular Disease

## 2012-03-30 DIAGNOSIS — E785 Hyperlipidemia, unspecified: Secondary | ICD-10-CM

## 2012-03-30 DIAGNOSIS — R079 Chest pain, unspecified: Secondary | ICD-10-CM | POA: Insufficient documentation

## 2012-03-30 DIAGNOSIS — E1151 Type 2 diabetes mellitus with diabetic peripheral angiopathy without gangrene: Secondary | ICD-10-CM

## 2012-03-30 DIAGNOSIS — I798 Other disorders of arteries, arterioles and capillaries in diseases classified elsewhere: Secondary | ICD-10-CM

## 2012-03-30 DIAGNOSIS — I10 Essential (primary) hypertension: Secondary | ICD-10-CM

## 2012-03-30 DIAGNOSIS — E1159 Type 2 diabetes mellitus with other circulatory complications: Secondary | ICD-10-CM

## 2012-03-30 NOTE — Assessment & Plan Note (Signed)
Cholesterol is at goal.  Continue current dose of statin and diet Rx.  No myalgias or side effects.  F/U  LFT's in 6 months. Lab Results  Component Value Date   LDLCALC 105* 03/02/2012

## 2012-03-30 NOTE — Assessment & Plan Note (Signed)
Discussed low carb diet.  Target hemoglobin A1c is 6.5 or less.  Continue current medications.  

## 2012-03-30 NOTE — Assessment & Plan Note (Signed)
Atypical, normal ECG  F/U ETT

## 2012-03-30 NOTE — Patient Instructions (Signed)
Your physician has requested that you have an exercise tolerance test. For further information please visit [email or web address]. Please also follow instruction sheet, as given.  Your physician recommends that you schedule a follow-up appointment in: as needed 

## 2012-03-30 NOTE — Assessment & Plan Note (Signed)
Well controlled.  Continue current medications and low sodium Dash type diet.    

## 2012-03-30 NOTE — Progress Notes (Signed)
Patient ID: Tanner Griffin, male   DOB: 1941/02/25, 71 y.o.   MRN: [id number] 71 yo HTN, cholesterol.  Referred for SSCP.  Atypical  Positional Lasted 4 days and resolved.  Had normal stress test about 3 years ago.  Activity limited by L knee pain.  Previous LTKR 2011 but chronic issues with it.  Compliant with meds.  No dyspnea.  No palpitations or syncope.  Reviewed ECG from 03/02/12 and normal.  Pain has resolved  Was intermitant and more sharp.  Worse at night when he tried to sleep  ROS: Denies fever, malais, weight loss, blurry vision, decreased visual acuity, cough, sputum, SOB, hemoptysis, pleuritic pain, palpitaitons, heartburn, abdominal pain, melena, lower extremity edema, claudication, or rash.  All other systems reviewed and negative   General: Affect appropriate Healthy:  appears stated age HEENT: normal Neck supple with no adenopathy JVP normal no bruits no thyromegaly Lungs clear with no wheezing and good diaphragmatic motion Heart:  S1/S2 no murmur,rub, gallop or click PMI normal Abdomen: benighn, BS positve, no tenderness, no AAA no bruit.  No HSM or HJR Distal pulses intact with no bruits No edema Neuro non-focal Skin warm and dry No muscular weakness s/P left TKR  Medications Current Outpatient Prescriptions  Medication Sig Dispense Refill  . amLODipine (NORVASC) 10 MG tablet Take 1 tablet (10 mg total) by mouth daily.  30 tablet  11  . aspirin 81 MG tablet Take 81 mg by mouth daily.        [name] labetalol (NORMODYNE) 200 MG tablet TAKE 1 TABLET BY MOUTH TWICE DAILY  60 tablet  2  . simvastatin (ZOCOR) 40 MG tablet TAKE ONE TABLET BY MOUTH DAILY  90 tablet  0  . Tamsulosin HCl (FLOMAX) 0.4 MG CAPS Take 0.4 mg by mouth daily after supper.       . terbinafine (LAMISIL) 250 MG tablet Take 250 mg by mouth daily.        Allergies Telmisartan  Family History: No family history on file.  Social History: History   Social History  . Marital Status: Married    Spouse  Name: N/A    Number of Children: N/A  . Years of Education: N/A   Occupational History  . Not on file.   Social History Main Topics  . Smoking status: Never Smoker   . Smokeless tobacco: Not on file  . Alcohol Use: Not on file  . Drug Use: Not on file  . Sexually Active: Not on file   Other Topics Concern  . Not on file   Social History Narrative  . No narrative on file    Electrocardiogram: 3/13  NSR normal ECG  Assessment and Plan

## 2012-04-04 ENCOUNTER — Institutional Professional Consult (permissible substitution): Payer: PRIVATE HEALTH INSURANCE | Admitting: Cardiovascular Disease

## 2012-04-06 ENCOUNTER — Telehealth: Payer: Self-pay | Admitting: *Deleted

## 2012-04-06 MED ORDER — NITROGLYCERIN 0.4 MG SL SUBL: 0.4000 mg | SUBLINGUAL_TABLET | SUBLINGUAL | Status: DC | PRN

## 2012-04-06 NOTE — Telephone Encounter (Signed)
Pt needs refills on NTG SL tablets, please.

## 2012-04-06 NOTE — Telephone Encounter (Signed)
rx sent in electronically 

## 2012-04-20 ENCOUNTER — Encounter: Payer: PRIVATE HEALTH INSURANCE | Admitting: Physician Assistant

## 2012-04-24 ENCOUNTER — Encounter: Payer: Self-pay | Admitting: Family Medicine

## 2012-04-24 ENCOUNTER — Ambulatory Visit (INDEPENDENT_AMBULATORY_CARE_PROVIDER_SITE_OTHER): Payer: PRIVATE HEALTH INSURANCE | Admitting: Family Medicine

## 2012-04-24 VITALS — BP 138/70 | HR 74 | Temp 98.2°F | Wt 174.0 lb

## 2012-04-24 DIAGNOSIS — A084 Viral intestinal infection, unspecified: Secondary | ICD-10-CM

## 2012-04-24 DIAGNOSIS — A09 Infectious gastroenteritis and colitis, unspecified: Secondary | ICD-10-CM

## 2012-04-24 NOTE — Progress Notes (Signed)
  Subjective:    Patient ID: Tanner Griffin, male    DOB: [birth year], 71 y.o.   MRN: [id number]  HPI Here for 3 days of watery diarrhea and mild cramps. No nausea or vomiting. No fever. He thinks this is a viral infection, and he thinks he is at the end of it. Today he feels much better nut thought he should check with [place]. His daughter and grandchild have had similar symptoms.    Review of Systems  Constitutional: Negative.   HENT: Negative.   Eyes: Negative.   Respiratory: Negative.   Gastrointestinal: Positive for abdominal pain. Negative for nausea, vomiting, constipation, blood in stool, abdominal distention and rectal pain.       Objective:   Physical Exam  Constitutional: He appears well-developed and well-nourished.  Cardiovascular: Normal rate, regular rhythm, normal heart sounds and intact distal pulses.   Pulmonary/Chest: Effort normal and breath sounds normal.  Abdominal: Soft. Bowel sounds are normal. He exhibits no distension and no mass. There is no tenderness. There is no rebound and no guarding.          Assessment & Plan:  This does look like a viral gastroenteritis, and he seems to be almost over it. Use Imodium AD prn

## 2012-05-03 ENCOUNTER — Ambulatory Visit (INDEPENDENT_AMBULATORY_CARE_PROVIDER_SITE_OTHER): Payer: PRIVATE HEALTH INSURANCE | Admitting: Physician Assistant

## 2012-05-03 DIAGNOSIS — R0989 Other specified symptoms and signs involving the circulatory and respiratory systems: Secondary | ICD-10-CM

## 2012-05-03 NOTE — Procedures (Signed)
Test canceled. Patient cannot walk on treadmill. Tereso Newcomer, PA-C  4:03 PM 05/03/2012

## 2012-05-03 NOTE — Progress Notes (Signed)
Tanner Griffin is a 71 y.o. male here for ETT today.   He has had a knee replacement and cannot walk on the treadmill. I observed him walk at 1 mph and he has very little ROM in his R knee. Cancel ETT.  Schedule Lexiscan Myoview. Tereso Newcomer, PA-C  8:52 AM 05/03/2012

## 2012-05-03 NOTE — Patient Instructions (Signed)
Your physician has requested that you have a lexiscan myoview. For further information please visit [email or web address]. Please follow instruction sheet, as given.   

## 2012-05-09 ENCOUNTER — Other Ambulatory Visit: Payer: Self-pay | Admitting: Internal Medicine

## 2012-05-10 NOTE — Telephone Encounter (Signed)
refill 

## 2012-05-11 ENCOUNTER — Ambulatory Visit (HOSPITAL_COMMUNITY): Payer: Medicare Other | Attending: Cardiology | Admitting: Radiology

## 2012-05-11 DIAGNOSIS — R079 Chest pain, unspecified: Secondary | ICD-10-CM | POA: Insufficient documentation

## 2012-05-11 MED ORDER — TECHNETIUM TC 99M TETROFOSMIN IV KIT
11.0000 | PACK | Freq: Once | INTRAVENOUS | Status: AC | PRN
  Administered 2012-05-11: 11 via INTRAVENOUS

## 2012-05-11 MED ORDER — TECHNETIUM TC 99M TETROFOSMIN IV KIT
33.0000 | PACK | Freq: Once | INTRAVENOUS | Status: AC | PRN
  Administered 2012-05-11: 33 via INTRAVENOUS

## 2012-05-11 MED ORDER — REGADENOSON 0.4 MG/5ML IV SOLN
0.4000 mg | Freq: Once | INTRAVENOUS | Status: AC
  Administered 2012-05-11: 0.4 mg via INTRAVENOUS

## 2012-05-11 NOTE — Progress Notes (Signed)
[facility] SITE 3 NUCLEAR MED [address] [place] [address] [phone]  Cardiology Nuclear Med Study  Tanner Griffin is a 71 y.o. male     MRN : [id number]     DOB: 11-29-1941  Procedure Date: 05/11/2012  Nuclear Med Background Indication for Stress Test:  Evaluation for Ischemia History:  '10 MPS:No ischemia, EF=45% Cardiac Risk Factors: Hypertension, Lipids and NIDDM  Symptoms:  Chest Pain (last episode of chest discomfort was about 62-months ago) and night sweats.   Nuclear Pre-Procedure Caffeine/Decaff Intake:  None NPO After: 8:00pm   Lungs:  Clear. O2 Sat: 98% on room air. IV 0.9% NS with Angio Cath:  20g  IV Site: R Forearm  IV Started by:  [name], EMT-P  Chest Size (in):  44 Cup Size: n/a  Height: 5' 7.5" (1.715 m)  Weight:  178 lb (80.74 kg)  BMI:  Body mass index is 27.47 kg/(m^2). Tech Comments:  Meds were taken at 6am, per patient.    Nuclear Med Study 1 or 2 day study: 1 day  Stress Test Type:  Lexiscan  Reading MD: Willa Rough, MD  Order Authorizing Provider:  Charlton Haws, MD  Resting Radionuclide: Technetium 15m Tetrofosmin  Resting Radionuclide Dose: 11.0 mCi   Stress Radionuclide:  Technetium 40m Tetrofosmin  Stress Radionuclide Dose: 32.9 mCi           Stress Protocol Rest HR: 61 Stress HR: 86  Rest BP: 136/73 Stress BP: 128/73  Exercise Time (min): n/a METS: n/a   Predicted Max HR: 151 bpm % Max HR: 56.95 bpm Rate Pressure Product: 28413   Dose of Adenosine (mg):  n/a Dose of Lexiscan: 0.4 mg  Dose of Atropine (mg): n/a Dose of Dobutamine: n/a mcg/kg/min (at max HR)  Stress Test Technologist: Smiley Houseman, CMA-N  Nuclear Technologist:  Domenic Polite, CNMT     Rest Procedure:  Myocardial perfusion imaging was performed at rest 45 minutes following the intravenous administration of Technetium 19m Tetrofosmin.  Rest ECG: LVH with repolarization.  Stress Procedure:  The patient received IV Lexiscan 0.4 mg over  15-seconds.  Technetium 53m Tetrofosmin injected at 30-seconds.  There were no significant changes with Lexiscan bolus; occasional short pauses were noted in recovery.  Quantitative spect images were obtained after a 45 minute delay.  Stress ECG: No significant change from baseline ECG  QPS Raw Data Images:  Patient motion noted; appropriate software correction applied. Stress Images:  Normal homogeneous uptake in all areas of the myocardium. Rest Images:  Normal homogeneous uptake in all areas of the myocardium. Subtraction (SDS):  No evidence of ischemia. Transient Ischemic Dilatation (Normal <1.22):  1.01 Lung/Heart Ratio (Normal <0.45):  0.31  Quantitative Gated Spect Images QGS EDV:  132 ml QGS ESV:  68 ml  Impression Exercise Capacity:  Lexiscan with no exercise. BP Response:  Normal blood pressure response. Clinical Symptoms:  No symptoms. ECG Impression:  No significant ST segment change suggestive of ischemia. Comparison with Prior Nuclear Study: No previous nuclear study performed  Overall Impression:  Normal stress nuclear study.  LV Ejection Fraction: 48%.  LV Wall Motion:  There is slight relative hypokinesis of the inferior wall and septum. It is of note that the history mentions an ejection fraction of 45% on a nuclear scan in the past. This study is not available for my review.  Willa Rough, MD

## 2012-05-12 ENCOUNTER — Telehealth: Payer: Self-pay | Admitting: *Deleted

## 2012-05-12 NOTE — Telephone Encounter (Signed)
Message copied by Tarri Fuller on Fri May 12, 2012 10:59 AM ------      Message from: [place], [place] T      Created: Thu May 11, 2012  5:25 PM       Please make sure patient is notified.      Tereso Newcomer, PA-C  5:25 PM 05/11/2012             ----- Message -----         From: Farris Has Deal         Sent: 05/11/2012   4:51 PM           To: Beatrice Lecher, PA

## 2012-05-12 NOTE — Telephone Encounter (Signed)
lmom stess test ok,

## 2012-05-22 ENCOUNTER — Other Ambulatory Visit: Payer: Self-pay | Admitting: Internal Medicine

## 2012-06-05 ENCOUNTER — Ambulatory Visit (INDEPENDENT_AMBULATORY_CARE_PROVIDER_SITE_OTHER): Payer: Medicare Other | Admitting: Internal Medicine

## 2012-06-05 VITALS — BP 148/82 | Temp 98.3°F | Wt 180.0 lb

## 2012-06-05 DIAGNOSIS — N401 Enlarged prostate with lower urinary tract symptoms: Secondary | ICD-10-CM

## 2012-06-05 DIAGNOSIS — N138 Other obstructive and reflux uropathy: Secondary | ICD-10-CM | POA: Insufficient documentation

## 2012-06-05 MED ORDER — TAMSULOSIN HCL 0.4 MG PO CAPS: 0.4000 mg | ORAL_CAPSULE | ORAL | Status: DC

## 2012-06-05 NOTE — Progress Notes (Signed)
Patient ID: Tanner Griffin, male   DOB: 04-Jun-1941, 71 y.o.   MRN: [id number]  Constipation-- has notes "slow bowels" since knee surgery. Takes dulcolax with results  bph symptoms-- needs refill flomax  Past Medical History  Diagnosis Date  . dm 2   . HYPERLIPIDEMIA   . HYPERTENSION   . Diabetes mellitus     History   Social History  . Marital Status: Married    Spouse Name: N/A    Number of Children: N/A  . Years of Education: N/A   Occupational History  . Not on file.   Social History Main Topics  . Smoking status: Never Smoker   . Smokeless tobacco: Never Used  . Alcohol Use: No  . Drug Use: No  . Sexually Active: Not on file   Other Topics Concern  . Not on file   Social History Narrative  . No narrative on file    No past surgical history on file.  No family history on file.  Allergies  Allergen Reactions  . Telmisartan     REACTION: headache    Current Outpatient Prescriptions on File Prior to Visit  Medication Sig Dispense Refill  . amLODipine (NORVASC) 10 MG tablet TAKE 1 TABLET BY MOUTH EVERY DAY  30 tablet  5  . aspirin 81 MG tablet Take 81 mg by mouth daily.        [name] labetalol (NORMODYNE) 200 MG tablet TAKE 1 TABLET BY MOUTH TWICE DAILY  60 tablet  0  . nitroGLYCERIN (NITROSTAT) 0.4 MG SL tablet Place 1 tablet (0.4 mg total) under the tongue every 5 (five) minutes as needed for chest pain.  30 tablet  11  . simvastatin (ZOCOR) 40 MG tablet TAKE ONE TABLET BY MOUTH DAILY  90 tablet  0  . Tamsulosin HCl (FLOMAX) 0.4 MG CAPS Take 0.4 mg by mouth daily after supper.       . terbinafine (LAMISIL) 250 MG tablet Take 250 mg by mouth daily.         patient denies chest pain, shortness of breath, orthopnea. Denies lower extremity edema, abdominal pain, change in appetite, change in bowel movements. Patient denies rashes, musculoskeletal complaints. No other specific complaints in a complete review of systems.   BP 148/82  Temp 98.3 F (36.8 C) (Oral)   Wt 180 lb (81.647 kg)  well-developed well-nourished male in no acute distress. HEENT exam atraumatic, normocephalic, neck supple without jugular venous distention. Chest clear to auscultation cardiac exam S1-S2 are regular. Abdominal exam overweight with bowel sounds, soft and nontender. Extremities no edema. Neurologic exam is alert with a normal gait.   A/p- r Constipation- resolved with dulcolax

## 2012-06-05 NOTE — Assessment & Plan Note (Signed)
Refill flomax

## 2012-06-19 ENCOUNTER — Other Ambulatory Visit: Payer: Self-pay | Admitting: Internal Medicine

## 2012-07-19 ENCOUNTER — Telehealth: Payer: Self-pay | Admitting: Internal Medicine

## 2012-07-19 NOTE — Telephone Encounter (Signed)
Caller: Mary/Spouse; PCP: Birdie Sons; CB#: [phone];  Call regarding: Requesting Cortisone Shot for Arthritis in L His Foot and Heel Spur for pain that started 2-3 wks ago.   He cannot get into see Dr. Cato Mulligan.  If another Dr. saw pt could that Dr. give him a Cortisone shot?  Triaged Foot Non-injury and all emergent SX R/O.  Pt is having increased pain and feels he needs to be seen earlier that his 08/15/12 appt with Dr. Cato Mulligan. Pt has taken Ibuprofen, but has not thelped.  Disp = needs to be seen in 24 hrs for pain that has increased after 24 hrs of home care.  Offered pt a 1600 appt with Dr. Artist Pais, but wife does not want to make it unless we can guarantee he will get a Cortisone shot.  Inst that cannot be determined unless the pt is evaluated.  She stated "thank you, we'll come up with something else" and hung up.

## 2012-07-19 NOTE — Telephone Encounter (Signed)
Follow up call.  Told pts wife to go to orthopedic.  She said she would do that and call back if they needed a referral

## 2012-07-20 ENCOUNTER — Other Ambulatory Visit: Payer: Self-pay | Admitting: Internal Medicine

## 2012-08-15 ENCOUNTER — Ambulatory Visit (INDEPENDENT_AMBULATORY_CARE_PROVIDER_SITE_OTHER): Payer: Medicare Other | Admitting: Internal Medicine

## 2012-08-15 ENCOUNTER — Encounter: Payer: Self-pay | Admitting: Internal Medicine

## 2012-08-15 VITALS — BP 148/88 | HR 76 | Temp 98.2°F | Wt 180.0 lb

## 2012-08-15 DIAGNOSIS — M7660 Achilles tendinitis, unspecified leg: Secondary | ICD-10-CM

## 2012-08-15 MED ORDER — MELOXICAM 7.5 MG PO TABS: 7.5000 mg | ORAL_TABLET | Freq: Every day | ORAL | Status: DC

## 2012-08-15 NOTE — Progress Notes (Signed)
Patient ID: Tanner Griffin, male   DOB: 1941/10/06, 71 y.o.   MRN: [id number] Heel pain- several weeks (3 months) Increase with walking Has seen "foot doctor" and ortho-- has had "shot" in foot  Pain is at achilles tendon insertion site.  Past Medical History  Diagnosis Date  . dm 2   . HYPERLIPIDEMIA   . HYPERTENSION   . Diabetes mellitus     History   Social History  . Marital Status: Married    Spouse Name: N/A    Number of Children: N/A  . Years of Education: N/A   Occupational History  . Not on file.   Social History Main Topics  . Smoking status: Never Smoker   . Smokeless tobacco: Never Used  . Alcohol Use: No  . Drug Use: No  . Sexually Active: Not on file   Other Topics Concern  . Not on file   Social History Narrative  . No narrative on file    No past surgical history on file.  No family history on file.  Allergies  Allergen Reactions  . Telmisartan     REACTION: headache    Current Outpatient Prescriptions on File Prior to Visit  Medication Sig Dispense Refill  . amLODipine (NORVASC) 10 MG tablet TAKE 1 TABLET BY MOUTH EVERY DAY  30 tablet  5  . aspirin 81 MG tablet Take 81 mg by mouth daily.        . bisacodyl (DULCOLAX) 5 MG EC tablet Take 5 mg by mouth 2 (two) times daily.      [name] labetalol (NORMODYNE) 200 MG tablet TAKE 1 TABLET BY MOUTH TWICE DAILY  60 tablet  0  . nitroGLYCERIN (NITROSTAT) 0.4 MG SL tablet Place 1 tablet (0.4 mg total) under the tongue every 5 (five) minutes as needed for chest pain.  30 tablet  11  . simvastatin (ZOCOR) 40 MG tablet TAKE ONE TABLET BY MOUTH DAILY  90 tablet  0  . Tamsulosin HCl (FLOMAX) 0.4 MG CAPS Take 1 capsule (0.4 mg total) by mouth daily after supper.  90 capsule  3  . terbinafine (LAMISIL) 250 MG tablet Take 250 mg by mouth daily.         patient denies chest pain, shortness of breath, orthopnea. Denies lower extremity edema, abdominal pain, change in appetite, change in bowel movements. Patient  denies rashes, musculoskeletal complaints. No other specific complaints in a complete review of systems.   BP 148/88  Pulse 76  Temp 98.2 F (36.8 C) (Oral)  Wt 180 lb (81.647 kg) Walks with a limp Tender at left achilles insertion site  Achilles tendonitis meloxicam Stretch Ice

## 2012-08-29 ENCOUNTER — Ambulatory Visit: Payer: Medicare Other | Admitting: Internal Medicine

## 2012-09-12 ENCOUNTER — Other Ambulatory Visit: Payer: Self-pay | Admitting: Internal Medicine

## 2012-09-13 ENCOUNTER — Encounter: Payer: Self-pay | Admitting: Internal Medicine

## 2012-09-18 ENCOUNTER — Encounter: Payer: Self-pay | Admitting: Internal Medicine

## 2012-09-19 ENCOUNTER — Other Ambulatory Visit: Payer: Self-pay | Admitting: Internal Medicine

## 2012-10-02 ENCOUNTER — Ambulatory Visit (INDEPENDENT_AMBULATORY_CARE_PROVIDER_SITE_OTHER): Payer: Medicare Other | Admitting: *Deleted

## 2012-10-02 DIAGNOSIS — Z23 Encounter for immunization: Secondary | ICD-10-CM

## 2012-10-10 ENCOUNTER — Other Ambulatory Visit: Payer: Self-pay | Admitting: Internal Medicine

## 2012-10-16 ENCOUNTER — Ambulatory Visit (AMBULATORY_SURGERY_CENTER): Payer: Medicare Other | Admitting: *Deleted

## 2012-10-16 VITALS — Ht 67.5 in | Wt 181.7 lb

## 2012-10-16 DIAGNOSIS — Z1211 Encounter for screening for malignant neoplasm of colon: Secondary | ICD-10-CM

## 2012-10-16 MED ORDER — NA SULFATE-K SULFATE-MG SULF 17.5-3.13-1.6 GM/177ML PO SOLN: ORAL | Status: DC

## 2012-10-30 ENCOUNTER — Encounter: Payer: Self-pay | Admitting: Internal Medicine

## 2012-10-30 ENCOUNTER — Ambulatory Visit (AMBULATORY_SURGERY_CENTER): Payer: Medicare Other | Admitting: Internal Medicine

## 2012-10-30 VITALS — BP 136/77 | HR 56 | Temp 97.6°F | Resp 18 | Ht 67.5 in | Wt 181.0 lb

## 2012-10-30 DIAGNOSIS — Z1211 Encounter for screening for malignant neoplasm of colon: Secondary | ICD-10-CM

## 2012-10-30 DIAGNOSIS — Z860100 Personal history of colon polyps, unspecified: Secondary | ICD-10-CM | POA: Insufficient documentation

## 2012-10-30 MED ORDER — SODIUM CHLORIDE 0.9 % IV SOLN: 500.0000 mL | INTRAVENOUS | Status: DC

## 2012-10-30 NOTE — Patient Instructions (Addendum)
No polyps! The exam was normal Consider a routine repeat colonoscopy at age 71 (not mandatory - will let you decide with your primary care MD)  Thank you for choosing me and [facility].  Iva Boop, MD, [facility]  Discharge instructions given with verbal understanding. Normal exam. Resume previous medications. YOU HAD AN ENDOSCOPIC PROCEDURE TODAY AT THE [facility]: Refer to the procedure report that was given to you for any specific questions about what was found during the examination.  If the procedure report does not answer your questions, please call your gastroenterologist to clarify.  If you requested that your care partner not be given the details of your procedure findings, then the procedure report has been included in a sealed envelope for you to review at your convenience later.  YOU SHOULD EXPECT: Some feelings of bloating in the abdomen. Passage of more gas than usual.  Walking can help get rid of the air that was put into your GI tract during the procedure and reduce the bloating. If you had a lower endoscopy (such as a colonoscopy or flexible sigmoidoscopy) you may notice spotting of blood in your stool or on the toilet paper. If you underwent a bowel prep for your procedure, then you may not have a normal bowel movement for a few days.  DIET: Your first meal following the procedure should be a light meal and then it is ok to progress to your normal diet.  A half-sandwich or bowl of soup is an example of a good first meal.  Heavy or fried foods are harder to digest and may make you feel nauseous or bloated.  Likewise meals heavy in dairy and vegetables can cause extra gas to form and this can also increase the bloating.  Drink plenty of fluids but you should avoid alcoholic beverages for 24 hours.  ACTIVITY: Your care partner should take you home directly after the procedure.  You should plan to take it easy, moving slowly for the rest of the day.  You can  resume normal activity the day after the procedure however you should NOT DRIVE or use heavy machinery for 24 hours (because of the sedation medicines used during the test).    SYMPTOMS TO REPORT IMMEDIATELY: A gastroenterologist can be reached at any hour.  During normal business hours, 8:30 AM to 5:00 PM Monday through Friday, call [phone].  After hours and on weekends, please call the GI answering service at [phone] who will take a message and have the physician on call contact you.   Following lower endoscopy (colonoscopy or flexible sigmoidoscopy):  Excessive amounts of blood in the stool  Significant tenderness or worsening of abdominal pains  Swelling of the abdomen that is new, acute  Fever of 100F or higher  FOLLOW UP: If any biopsies were taken you will be contacted by phone or by letter within the next 1-3 weeks.  Call your gastroenterologist if you have not heard about the biopsies in 3 weeks.  Our staff will call the home number listed on your records the next business day following your procedure to check on you and address any questions or concerns that you may have at that time regarding the information given to you following your procedure. This is a courtesy call and so if there is no answer at the home number and we have not heard from you through the emergency physician on call, we will assume that you have returned to your regular daily  activities without incident.  SIGNATURES/CONFIDENTIALITY: You and/or your care partner have signed paperwork which will be entered into your electronic medical record.  These signatures attest to the fact that that the information above on your After Visit Summary has been reviewed and is understood.  Full responsibility of the confidentiality of this discharge information lies with you and/or your care-partner.  

## 2012-10-30 NOTE — Progress Notes (Signed)
Patient did not experience any of the following events: a burn prior to discharge; a fall within the facility; wrong site/side/patient/procedure/implant event; or a hospital transfer or hospital admission upon discharge from the facility. (G8907) Patient did not have preoperative order for IV antibiotic SSI prophylaxis. (G8918)  

## 2012-10-30 NOTE — Progress Notes (Signed)
Propofol per B Walton CRNA. EWM 

## 2012-10-30 NOTE — Op Note (Signed)
[facility] [address] [place], [address]   COLONOSCOPY PROCEDURE REPORT  PATIENT: Tanner Griffin, Tanner Griffin  MR#: [id number] BIRTHDATE: [birth year] , 71  yrs. old GENDER: Male ENDOSCOPIST: Iva Boop, MD, [facility]  PROCEDURE DATE:  10/30/2012 PROCEDURE:   Colonoscopy ASA CLASS:   Class II INDICATIONS:screening and surveillance,personal history of colonic polyps and patient's personal history of adenomatous colon polyps.  MEDICATIONS: propofol (Diprivan) 100mg  IV, MAC sedation, administered by CRNA, and These medications were titrated to patient response per physician's verbal order  DESCRIPTION OF PROCEDURE:   After the risks benefits and alternatives of the procedure were thoroughly explained, informed consent was obtained.  A digital rectal exam revealed no abnormalities of the rectum and A digital rectal exam revealed the prostate was not enlarged.   The LB [id number]  endoscope was introduced through the anus and advanced to the cecum, which was identified by both the appendix and ileocecal valve. No adverse events experienced.   The quality of the prep was Suprep excellent The instrument was then slowly withdrawn as the colon was fully examined.      COLON FINDINGS: The colonic mucosa appeared normal.  Retroflexed views in rectum and right colon revealed no abnormalities. The time to cecum=1 minutes 43 seconds.  Withdrawal time=8 minutes 20 seconds.  The scope was withdrawn and the procedure completed. COMPLICATIONS: There were no complications.  ENDOSCOPIC IMPRESSION: The colonic mucosa appeared normal with excellent prep History of diminutiove adenoma removal 2007  RECOMMENDATIONS: follow-up is advised on an as needed basis, in 10 years may consider another colonoscopy with PCP advice, no automatic repeat/recall   eSigned:  Iva Boop, MD, [facility] 10/30/2012 11:51 AM   cc: Lindley Magnus, MD and The Patient

## 2012-10-31 ENCOUNTER — Telehealth: Payer: Self-pay

## 2012-10-31 NOTE — Telephone Encounter (Signed)
  Follow up Call-  Call back number 10/30/2012  Post procedure Call Back phone  # [phone] hm, [phone] cell  Permission to leave phone message Yes     Patient questions:  Do you have a fever, pain , or abdominal swelling? no Pain Score  0 *  Have you tolerated food without any problems? yes  Have you been able to return to your normal activities? yes  Do you have any questions about your discharge instructions: Diet   no Medications  no Follow up visit  no  Do you have questions or concerns about your Care? no  Actions: * If pain score is 4 or above: No action needed, pain <4.  No problems noted. Maw

## 2012-11-08 ENCOUNTER — Other Ambulatory Visit: Payer: Self-pay | Admitting: Internal Medicine

## 2012-11-15 ENCOUNTER — Encounter: Payer: Self-pay | Admitting: Internal Medicine

## 2012-11-15 ENCOUNTER — Ambulatory Visit (INDEPENDENT_AMBULATORY_CARE_PROVIDER_SITE_OTHER): Payer: Medicare Other | Admitting: Internal Medicine

## 2012-11-15 VITALS — BP 132/78 | HR 68 | Temp 98.3°F | Wt 183.0 lb

## 2012-11-15 DIAGNOSIS — I10 Essential (primary) hypertension: Secondary | ICD-10-CM

## 2012-11-15 DIAGNOSIS — I798 Other disorders of arteries, arterioles and capillaries in diseases classified elsewhere: Secondary | ICD-10-CM

## 2012-11-15 DIAGNOSIS — E785 Hyperlipidemia, unspecified: Secondary | ICD-10-CM

## 2012-11-15 DIAGNOSIS — E1151 Type 2 diabetes mellitus with diabetic peripheral angiopathy without gangrene: Secondary | ICD-10-CM

## 2012-11-15 DIAGNOSIS — E1159 Type 2 diabetes mellitus with other circulatory complications: Secondary | ICD-10-CM

## 2012-11-15 DIAGNOSIS — Z79899 Other long term (current) drug therapy: Secondary | ICD-10-CM

## 2012-11-15 LAB — LIPID PANEL
Cholesterol: 149 mg/dL (ref 0–200)
LDL Cholesterol: 83 mg/dL (ref 0–99)
Triglycerides: 97 mg/dL (ref 0.0–149.0)
VLDL: 19.4 mg/dL (ref 0.0–40.0)

## 2012-11-15 LAB — HEPATIC FUNCTION PANEL
ALT: 33 U/L (ref 0–53)
Bilirubin, Direct: 0 mg/dL (ref 0.0–0.3)
Total Bilirubin: 0.8 mg/dL (ref 0.3–1.2)

## 2012-11-15 LAB — BASIC METABOLIC PANEL
Calcium: 9.4 mg/dL (ref 8.4–10.5)
Chloride: 109 mEq/L (ref 96–112)
Creatinine, Ser: 1.1 mg/dL (ref 0.4–1.5)

## 2012-11-15 NOTE — Progress Notes (Signed)
Patient ID: Tanner Griffin, male   DOB: 18-Dec-1941, 71 y.o.   MRN: [id number]  patient comes in for followup of multiple medical problems including type 2 diabetes, hyperlipidemia, hypertension. The patient does not check blood sugar or blood pressure at home. The patetient does not follow an exercise or diet program. The patient denies any polyuria, polydipsia.  In the past the patient has gone to diabetic treatment center. The patient is tolerating medications  Without difficulty. The patient does admit to medication compliance.   Past Medical History  Diagnosis Date  . HYPERLIPIDEMIA   . HYPERTENSION   . dm 2     pt denies dm  . Diabetes mellitus     not taking any med  . Personal history of colonic adenoma 10/30/2012    5 mm cecal adenoma 2007 10/30/2012      History   Social History  . Marital Status: Married    Spouse Name: N/A    Number of Children: N/A  . Years of Education: N/A   Occupational History  . Not on file.   Social History Main Topics  . Smoking status: Never Smoker   . Smokeless tobacco: Never Used  . Alcohol Use: No  . Drug Use: No  . Sexually Active: Not on file   Other Topics Concern  . Not on file   Social History Narrative  . No narrative on file    Past Surgical History  Procedure Date  . Total knee arthroplasty 2011    right  . Rt knee total replacement   . Colonoscopy     Family History  Problem Relation Age of Onset  . Colon cancer Neg Hx   . Stomach cancer Neg Hx   . Esophageal cancer Neg Hx   . Rectal cancer Neg Hx     Allergies  Allergen Reactions  . Telmisartan     REACTION: headache    Current Outpatient Prescriptions on File Prior to Visit  Medication Sig Dispense Refill  . amLODipine (NORVASC) 10 MG tablet TAKE 1 TABLET BY MOUTH EVERY DAY  30 tablet  0  . aspirin 81 MG tablet Take 81 mg by mouth daily.        . bisacodyl (DULCOLAX) 5 MG EC tablet Take 5 mg by mouth 2 (two) times daily.      [name] labetalol (NORMODYNE)  200 MG tablet TAKE 1 TABLET BY MOUTH TWICE DAILY  60 tablet  0  . meloxicam (MOBIC) 7.5 MG tablet TAKE 1 TABLET BY MOUTH ONCE DAILY  30 tablet  5  . nitroGLYCERIN (NITROSTAT) 0.4 MG SL tablet Place 1 tablet (0.4 mg total) under the tongue every 5 (five) minutes as needed for chest pain.  30 tablet  11  . simvastatin (ZOCOR) 40 MG tablet TAKE ONE TABLET BY MOUTH DAILY  90 tablet  0  . Tamsulosin HCl (FLOMAX) 0.4 MG CAPS Take 1 capsule (0.4 mg total) by mouth daily after supper.  90 capsule  3     patient denies chest pain, shortness of breath, orthopnea. Denies lower extremity edema, abdominal pain, change in appetite, change in bowel movements. Patient denies rashes, musculoskeletal complaints. No other specific complaints in a complete review of systems.   There were no vitals taken for this visit.  well-developed well-nourished male in no acute distress. HEENT exam atraumatic, normocephalic, neck supple without jugular venous distention. Chest clear to auscultation cardiac exam S1-S2 are regular. Abdominal exam overweight with bowel sounds, soft and nontender.  Extremities no edema. Neurologic exam is alert with a normal gait.

## 2012-11-15 NOTE — Assessment & Plan Note (Signed)
Fair control; continue meds 

## 2012-11-15 NOTE — Assessment & Plan Note (Signed)
Check labs today.

## 2012-11-16 NOTE — Assessment & Plan Note (Signed)
No meds and previously well controlled

## 2012-11-17 NOTE — Progress Notes (Signed)
Quick Note:  Left a message for pt to return call. ______ 

## 2012-11-21 ENCOUNTER — Other Ambulatory Visit: Payer: Self-pay | Admitting: Internal Medicine

## 2012-12-04 ENCOUNTER — Other Ambulatory Visit: Payer: Self-pay | Admitting: Internal Medicine

## 2012-12-06 ENCOUNTER — Encounter: Payer: Self-pay | Admitting: Family

## 2012-12-06 ENCOUNTER — Ambulatory Visit (INDEPENDENT_AMBULATORY_CARE_PROVIDER_SITE_OTHER): Payer: Medicare Other | Admitting: Family

## 2012-12-06 VITALS — BP 140/80 | HR 76 | Temp 98.0°F | Wt 181.0 lb

## 2012-12-06 DIAGNOSIS — L259 Unspecified contact dermatitis, unspecified cause: Secondary | ICD-10-CM

## 2012-12-06 DIAGNOSIS — L299 Pruritus, unspecified: Secondary | ICD-10-CM

## 2012-12-06 MED ORDER — TRIAMCINOLONE 0.1 % CREAM:EUCERIN CREAM 1:1: 1.0000 "application " | TOPICAL_CREAM | Freq: Two times a day (BID) | CUTANEOUS | Status: DC

## 2012-12-06 MED ORDER — METHYLPREDNISOLONE ACETATE 40 MG/ML IJ SUSP
80.0000 mg | Freq: Once | INTRAMUSCULAR | Status: AC
  Administered 2012-12-06: 80 mg via INTRAMUSCULAR

## 2012-12-06 NOTE — Progress Notes (Signed)
Subjective:    Patient ID: Tanner Griffin, male    DOB: [birth year], 71 y.o.   MRN: [id number]  HPI 71 year old [place] American male, nonsmoker, patient of Dr. Cato Mulligan is in today with a rash to his thighs and lower back x2 weeks and worsening. He denies any known changes in detergents, soaps, or lotions. He is unsure of any new or exotic foods with in the last 2 weeks. He's been applying calamine lotion and taking Benadryl with no relief. Denies any contact with any other rashes. No burning or tingling.   Review of Systems  Constitutional: Negative.   Respiratory: Negative.   Cardiovascular: Negative.   Neurological: Negative.   Hematological: Negative.   Psychiatric/Behavioral: Negative.    Past Medical History  Diagnosis Date  . HYPERLIPIDEMIA   . HYPERTENSION   . dm 2     pt denies dm  . Diabetes mellitus     not taking any med  . Personal history of colonic adenoma 10/30/2012    5 mm cecal adenoma 2007 10/30/2012      History   Social History  . Marital Status: Married    Spouse Name: N/A    Number of Children: N/A  . Years of Education: N/A   Occupational History  . Not on file.   Social History Main Topics  . Smoking status: Never Smoker   . Smokeless tobacco: Never Used  . Alcohol Use: No  . Drug Use: No  . Sexually Active: Not on file   Other Topics Concern  . Not on file   Social History Narrative  . No narrative on file    Past Surgical History  Procedure Date  . Total knee arthroplasty 2011    right  . Rt knee total replacement   . Colonoscopy     Family History  Problem Relation Age of Onset  . Colon cancer Neg Hx   . Stomach cancer Neg Hx   . Esophageal cancer Neg Hx   . Rectal cancer Neg Hx     Allergies  Allergen Reactions  . Telmisartan     REACTION: headache    Current Outpatient Prescriptions on File Prior to Visit  Medication Sig Dispense Refill  . amLODipine (NORVASC) 10 MG tablet TAKE 1 TABLET BY MOUTH DAILY  30 tablet   0  . aspirin 81 MG tablet Take 81 mg by mouth daily.        [name] labetalol (NORMODYNE) 200 MG tablet TAKE 1 TABLET BY MOUTH TWICE DAILY  60 tablet  0  . meloxicam (MOBIC) 7.5 MG tablet TAKE 1 TABLET BY MOUTH ONCE DAILY  30 tablet  5  . nitroGLYCERIN (NITROSTAT) 0.4 MG SL tablet Place 1 tablet (0.4 mg total) under the tongue every 5 (five) minutes as needed for chest pain.  30 tablet  11  . simvastatin (ZOCOR) 40 MG tablet TAKE ONE TABLET BY MOUTH DAILY  90 tablet  0  . Tamsulosin HCl (FLOMAX) 0.4 MG CAPS Take 1 capsule (0.4 mg total) by mouth daily after supper.  90 capsule  3    BP 140/80  Pulse 76  Temp 98 F (36.7 C) (Oral)  Wt 181 lb (82.101 kg)  SpO2 98%chart    Objective:   Physical Exam  Constitutional: He is oriented to person, place, and time. He appears well-developed and well-nourished.  Neck: Normal range of motion. Neck supple.  Cardiovascular: Normal rate, regular rhythm and normal heart sounds.   Pulmonary/Chest: Effort normal and  breath sounds normal.  Neurological: He is alert and oriented to person, place, and time.  Skin: Rash noted.       Rash to the thighs bilaterally. Similar rash to the right lower back and buttocks. Dry, flaking, and excoriated. No blistering or pustules.  Psychiatric: He has a normal mood and affect.          Assessment & Plan:  Assessment: Contact dermatitis, pleuritic rash  Plan: Triamcinolone and Eucerin one-to-one mix applied to the affected area twice a day. Patient call the office if symptoms worsen or persist. Recheck a schedule, appearing.

## 2012-12-06 NOTE — Patient Instructions (Addendum)

## 2012-12-21 ENCOUNTER — Other Ambulatory Visit: Payer: Self-pay | Admitting: Internal Medicine

## 2013-01-01 ENCOUNTER — Ambulatory Visit (INDEPENDENT_AMBULATORY_CARE_PROVIDER_SITE_OTHER): Payer: Medicare Other | Admitting: Family

## 2013-01-01 ENCOUNTER — Encounter: Payer: Self-pay | Admitting: Family

## 2013-01-01 VITALS — BP 148/90 | HR 77 | Wt 183.0 lb

## 2013-01-01 DIAGNOSIS — L282 Other prurigo: Secondary | ICD-10-CM

## 2013-01-01 DIAGNOSIS — L259 Unspecified contact dermatitis, unspecified cause: Secondary | ICD-10-CM

## 2013-01-01 MED ORDER — TRIAMCINOLONE ACETONIDE 0.1 % EX CREA: TOPICAL_CREAM | Freq: Two times a day (BID) | CUTANEOUS | Status: DC

## 2013-01-01 NOTE — Progress Notes (Signed)
Subjective:    Patient ID: Tanner Griffin, male    DOB: Apr 11, 1941, 72 y.o.   MRN: [id number]  HPI 72 year old black male, nonsmoker, patient of Dr. Cato Mulligan is in today with persistent itchy rash to the arms and legs bilaterally. He was orginally seen in December and treated with Eucerin and Triamcinolone cream 1:1 mix that has improved symptoms.  Wife has a similar rash and Dr. Fabian Sharp treated with Triamcinolone that has been effective. Denies any changes in detergents, soaps, or lotions.    Review of Systems  Constitutional: Negative.   Respiratory: Negative.   Gastrointestinal: Negative.   Genitourinary: Negative.   Musculoskeletal: Negative.   Skin: Positive for rash.  Psychiatric/Behavioral: Negative.    Past Medical History  Diagnosis Date  . HYPERLIPIDEMIA   . HYPERTENSION   . dm 2     pt denies dm  . Diabetes mellitus     not taking any med  . Personal history of colonic adenoma 10/30/2012    5 mm cecal adenoma 2007 10/30/2012      History   Social History  . Marital Status: Married    Spouse Name: N/A    Number of Children: N/A  . Years of Education: N/A   Occupational History  . Not on file.   Social History Main Topics  . Smoking status: Never Smoker   . Smokeless tobacco: Never Used  . Alcohol Use: No  . Drug Use: No  . Sexually Active: Not on file   Other Topics Concern  . Not on file   Social History Narrative  . No narrative on file    Past Surgical History  Procedure Date  . Total knee arthroplasty 2011    right  . Rt knee total replacement   . Colonoscopy     Family History  Problem Relation Age of Onset  . Colon cancer Neg Hx   . Stomach cancer Neg Hx   . Esophageal cancer Neg Hx   . Rectal cancer Neg Hx     Allergies  Allergen Reactions  . Telmisartan     REACTION: headache    Current Outpatient Prescriptions on File Prior to Visit  Medication Sig Dispense Refill  . amLODipine (NORVASC) 10 MG tablet TAKE 1 TABLET BY MOUTH  DAILY  30 tablet  0  . aspirin 81 MG tablet Take 81 mg by mouth daily.        [name] labetalol (NORMODYNE) 200 MG tablet TAKE 1 TABLET BY MOUTH TWICE DAILY  60 tablet  0  . meloxicam (MOBIC) 7.5 MG tablet TAKE 1 TABLET BY MOUTH ONCE DAILY  30 tablet  5  . nitroGLYCERIN (NITROSTAT) 0.4 MG SL tablet Place 1 tablet (0.4 mg total) under the tongue every 5 (five) minutes as needed for chest pain.  30 tablet  11  . simvastatin (ZOCOR) 40 MG tablet TAKE 1 TABLET BY MOUTH ONCE DAILY  90 tablet  0  . Tamsulosin HCl (FLOMAX) 0.4 MG CAPS Take 1 capsule (0.4 mg total) by mouth daily after supper.  90 capsule  3  . Triamcinolone Acetonide (TRIAMCINOLONE 0.1 % CREAM : EUCERIN) CREA Apply 1 application topically 2 (two) times daily.  1 each  2    BP 148/90  Pulse 77  Wt 183 lb (83.008 kg)  SpO2 97%chart   Objective:   Physical Exam  Constitutional: He is oriented to person, place, and time. He appears well-developed and well-nourished.  HENT:  Right Ear: External ear  normal.  Left Ear: External ear normal.  Nose: Nose normal.  Mouth/Throat: Oropharynx is clear and moist.  Neck: Normal range of motion. Neck supple.  Cardiovascular: Normal rate, regular rhythm and normal heart sounds.   Pulmonary/Chest: Effort normal and breath sounds normal.  Abdominal: Soft. Bowel sounds are normal.  Neurological: He is alert and oriented to person, place, and time.  Skin: Skin is warm and dry. Rash noted.       Dry, flaky rash to the upper anterior thighs. antecubital areas bilaterally.   Psychiatric: He has a normal mood and affect.          Assessment & Plan:  Assessment: Contact Dermatitis, Pruritus  Plan: Triamcinolone .1% apply to the AA twice a day. Call the office if symptoms worsen or persist. Consider referral to dermatology if no better. Keep a diary of anything new or different causing the rash to be any worse.

## 2013-01-09 ENCOUNTER — Other Ambulatory Visit: Payer: Self-pay | Admitting: Internal Medicine

## 2013-01-17 ENCOUNTER — Telehealth: Payer: Self-pay | Admitting: Family

## 2013-01-17 DIAGNOSIS — R21 Rash and other nonspecific skin eruption: Secondary | ICD-10-CM

## 2013-01-17 NOTE — Telephone Encounter (Signed)
Caller: Misty/Patient; Phone: [phone]; Reason for Call: Rash has come back and would like to be reffered to a dermatologist.  Jms/can

## 2013-01-17 NOTE — Telephone Encounter (Signed)
Ok to refer.

## 2013-01-18 ENCOUNTER — Telehealth: Payer: Self-pay | Admitting: Internal Medicine

## 2013-01-18 ENCOUNTER — Encounter: Payer: Self-pay | Admitting: Internal Medicine

## 2013-01-18 NOTE — Telephone Encounter (Signed)
Caller: Mary/Spouse; Phone: [phone]; Reason for Call: Caller reports pt has had a rash from neck to knees for a couple of months.  Pt was seen in the office 2 weeks ago by NP Orvan Falconer.  Caller was told that pt would need a dermatogist appt if rash did not clear up.  RN viewed in EPIC that NP Orvan Falconer go the ok for the referral.  Caller would like to know who is referral with and when appt will be scheduled. Pt missed a call yesterday and she is afraid it was regarding this.  Office please follow up with patient

## 2013-01-18 NOTE — Telephone Encounter (Signed)
Called yesterday and there was no answer called again today and spoke with pt he is aware of appt and I am also sending out a letter to the pt

## 2013-01-19 ENCOUNTER — Other Ambulatory Visit: Payer: Self-pay | Admitting: Internal Medicine

## 2013-01-22 ENCOUNTER — Other Ambulatory Visit: Payer: Self-pay | Admitting: *Deleted

## 2013-01-22 MED ORDER — LABETALOL HCL 200 MG PO TABS: 200.0000 mg | ORAL_TABLET | Freq: Two times a day (BID) | ORAL | Status: DC

## 2013-01-29 ENCOUNTER — Other Ambulatory Visit: Payer: Self-pay | Admitting: Dermatology

## 2013-01-29 DIAGNOSIS — L259 Unspecified contact dermatitis, unspecified cause: Secondary | ICD-10-CM | POA: Diagnosis not present

## 2013-01-29 DIAGNOSIS — L270 Generalized skin eruption due to drugs and medicaments taken internally: Secondary | ICD-10-CM | POA: Diagnosis not present

## 2013-01-31 ENCOUNTER — Telehealth: Payer: Self-pay | Admitting: Internal Medicine

## 2013-01-31 NOTE — Telephone Encounter (Signed)
Pt wants to know if he should be on both of these meds?? Pt said it looks like too many MGs. labetalol (NORMODYNE) 200 MG tablet amLODipine (NORVASC) 10 MG tablet Pls advise.

## 2013-02-01 NOTE — Telephone Encounter (Signed)
Stay on both They are very different medications but both for BP

## 2013-02-05 NOTE — Telephone Encounter (Signed)
No answer, no machine.

## 2013-02-06 NOTE — Telephone Encounter (Signed)
pt's spouse aware

## 2013-02-11 ENCOUNTER — Other Ambulatory Visit: Payer: Self-pay | Admitting: Internal Medicine

## 2013-03-06 DIAGNOSIS — M1710 Unilateral primary osteoarthritis, unspecified knee: Secondary | ICD-10-CM | POA: Diagnosis not present

## 2013-03-13 ENCOUNTER — Other Ambulatory Visit: Payer: Self-pay | Admitting: Internal Medicine

## 2013-03-18 NOTE — Assessment & Plan Note (Signed)
Fair control Continue same meds 

## 2013-03-18 NOTE — Progress Notes (Signed)
Patient ID: Tanner Griffin, male   DOB: 1941-11-30, 72 y.o.   MRN: [id number]  patient comes in for followup of multiple medical problems including type 2 diabetes, hyperlipidemia, hypertension. The patient does not check blood sugar or blood pressure at home. The patetient does not follow an exercise or diet program. The patient denies any polyuria, polydipsia.  In the past the patient has gone to diabetic treatment center. The patient is tolerating medications  Without difficulty. The patient does admit to medication compliance except only taking one labetalol daily.[name]   He has had a rash-- see pathology report  Past Medical History  Diagnosis Date  . HYPERLIPIDEMIA   . HYPERTENSION   . dm 2     pt denies dm  . Diabetes mellitus     not taking any med  . Personal history of colonic adenoma 10/30/2012    5 mm cecal adenoma 2007 10/30/2012      History   Social History  . Marital Status: Married    Spouse Name: N/A    Number of Children: N/A  . Years of Education: N/A   Occupational History  . Not on file.   Social History Main Topics  . Smoking status: Never Smoker   . Smokeless tobacco: Never Used  . Alcohol Use: No  . Drug Use: No  . Sexually Active: Not on file   Other Topics Concern  . Not on file   Social History Narrative  . No narrative on file    Past Surgical History  Procedure Laterality Date  . Total knee arthroplasty  2011    right  . Rt knee total replacement    . Colonoscopy      Family History  Problem Relation Age of Onset  . Colon cancer Neg Hx   . Stomach cancer Neg Hx   . Esophageal cancer Neg Hx   . Rectal cancer Neg Hx     Allergies  Allergen Reactions  . Telmisartan     REACTION: headache    Current Outpatient Prescriptions on File Prior to Visit  Medication Sig Dispense Refill  . amLODipine (NORVASC) 10 MG tablet TAKE 1 TABLET BY MOUTH DAILY  30 tablet  0  . aspirin 81 MG tablet Take 81 mg by mouth daily.        [name] labetalol  (NORMODYNE) 200 MG tablet Take 1 tablet (200 mg total) by mouth 2 (two) times daily.  60 tablet  5  . meloxicam (MOBIC) 7.5 MG tablet TAKE 1 TABLET BY MOUTH ONCE DAILY  30 tablet  5  . nitroGLYCERIN (NITROSTAT) 0.4 MG SL tablet Place 1 tablet (0.4 mg total) under the tongue every 5 (five) minutes as needed for chest pain.  30 tablet  11  . simvastatin (ZOCOR) 40 MG tablet TAKE 1 TABLET BY MOUTH ONCE DAILY  90 tablet  0  . Tamsulosin HCl (FLOMAX) 0.4 MG CAPS Take 1 capsule (0.4 mg total) by mouth daily after supper.  90 capsule  3  . Triamcinolone Acetonide (TRIAMCINOLONE 0.1 % CREAM : EUCERIN) CREA Apply 1 application topically 2 (two) times daily.  1 each  2  . triamcinolone cream (KENALOG) 0.1 % Apply topically 2 (two) times daily.  30 g  1   No current facility-administered medications on file prior to visit.     patient denies chest pain, shortness of breath, orthopnea. Denies lower extremity edema, abdominal pain, change in appetite, change in bowel movements. Patient denies rashes, musculoskeletal complaints.  No other specific complaints in a complete review of systems.   There were no vitals taken for this visit.  well-developed well-nourished male in no acute distress. HEENT exam atraumatic, normocephalic, neck supple without jugular venous distention. Chest clear to auscultation cardiac exam S1-S2 are regular. Abdominal exam overweight with bowel sounds, soft and nontender. Extremities no edema. Neurologic exam is alert with a normal gait.

## 2013-03-18 NOTE — Assessment & Plan Note (Signed)
Previously controlled 

## 2013-03-18 NOTE — Assessment & Plan Note (Signed)
Previously controlled Check labs today See me 6 months

## 2013-03-19 ENCOUNTER — Ambulatory Visit (INDEPENDENT_AMBULATORY_CARE_PROVIDER_SITE_OTHER): Payer: Medicare Other | Admitting: Internal Medicine

## 2013-03-19 VITALS — BP 110/70 | HR 64 | Temp 98.8°F | Wt 188.0 lb

## 2013-03-19 DIAGNOSIS — E1159 Type 2 diabetes mellitus with other circulatory complications: Secondary | ICD-10-CM | POA: Diagnosis not present

## 2013-03-19 DIAGNOSIS — L259 Unspecified contact dermatitis, unspecified cause: Secondary | ICD-10-CM | POA: Diagnosis not present

## 2013-03-19 DIAGNOSIS — L308 Other specified dermatitis: Secondary | ICD-10-CM | POA: Insufficient documentation

## 2013-03-19 DIAGNOSIS — I10 Essential (primary) hypertension: Secondary | ICD-10-CM

## 2013-03-19 DIAGNOSIS — E785 Hyperlipidemia, unspecified: Secondary | ICD-10-CM | POA: Diagnosis not present

## 2013-03-19 DIAGNOSIS — I798 Other disorders of arteries, arterioles and capillaries in diseases classified elsewhere: Secondary | ICD-10-CM | POA: Diagnosis not present

## 2013-03-19 DIAGNOSIS — E1151 Type 2 diabetes mellitus with diabetic peripheral angiopathy without gangrene: Secondary | ICD-10-CM

## 2013-03-19 LAB — MICROALBUMIN / CREATININE URINE RATIO
Creatinine,U: 366 mg/dL
Microalb Creat Ratio: 0.5 mg/g (ref 0.0–30.0)
Microalb, Ur: 1.9 mg/dL (ref 0.0–1.9)

## 2013-03-19 LAB — HEMOGLOBIN A1C: Hgb A1c MFr Bld: 6.6 % — ABNORMAL HIGH (ref 4.6–6.5)

## 2013-03-19 LAB — SEDIMENTATION RATE: Sed Rate: 10 mm/hr (ref 0–22)

## 2013-03-19 NOTE — Assessment & Plan Note (Signed)
Discussed at length- Reviewed pathology report i suspect diffuse rash is medication related He has indocin and celebrex in his bag- it's unclear how much he is taking of these-- stop all NSAIDs

## 2013-03-19 NOTE — Patient Instructions (Addendum)
Call me in 2 weeks and tell me how rash is

## 2013-03-19 NOTE — Addendum Note (Signed)
Addended by: Bonnye Fava on: 03/19/2013 08:41 AM   Modules accepted: Orders

## 2013-03-23 ENCOUNTER — Other Ambulatory Visit: Payer: Self-pay | Admitting: Internal Medicine

## 2013-04-02 ENCOUNTER — Telehealth: Payer: Self-pay | Admitting: *Deleted

## 2013-04-02 ENCOUNTER — Ambulatory Visit (INDEPENDENT_AMBULATORY_CARE_PROVIDER_SITE_OTHER): Payer: Self-pay | Admitting: Family Medicine

## 2013-04-02 ENCOUNTER — Encounter: Payer: Self-pay | Admitting: Family Medicine

## 2013-04-02 VITALS — BP 128/70 | HR 87 | Temp 98.1°F | Wt 190.0 lb

## 2013-04-02 DIAGNOSIS — R238 Other skin changes: Secondary | ICD-10-CM

## 2013-04-02 DIAGNOSIS — L988 Other specified disorders of the skin and subcutaneous tissue: Secondary | ICD-10-CM | POA: Diagnosis not present

## 2013-04-02 DIAGNOSIS — L309 Dermatitis, unspecified: Secondary | ICD-10-CM

## 2013-04-02 DIAGNOSIS — L259 Unspecified contact dermatitis, unspecified cause: Secondary | ICD-10-CM | POA: Diagnosis not present

## 2013-04-02 MED ORDER — SILVER SULFADIAZINE 1 % EX CREA: TOPICAL_CREAM | Freq: Every day | CUTANEOUS | Status: DC

## 2013-04-02 NOTE — Progress Notes (Signed)
Chief Complaint  Patient presents with  . sore on right leg    edema;     HPI:  Acute visit for wound on leg: -just saw PCP for diabetes and rash - bxs done for rash and spngiotic derm thought to be med related - had been advised to stop all NSAIDs - stopped these, but did not get better Lab Results  Component Value Date   HGBA1C 6.6* 03/19/2013  -reports has been using a bunch of lotions from drug store for rash  -reports also has seen a dermatologist about 1 month ago and prescribe creams - but he does not know what creams he used - reports did -reports rash has improved but developed blister on R leg last week then turned into scab   ROS: See pertinent positives and negatives per HPI.  Past Medical History  Diagnosis Date  . HYPERLIPIDEMIA   . HYPERTENSION   . dm 2     pt denies dm  . Diabetes mellitus     not taking any med  . Personal history of colonic adenoma 10/30/2012    5 mm cecal adenoma 2007 10/30/2012      Family History  Problem Relation Age of Onset  . Colon cancer Neg Hx   . Stomach cancer Neg Hx   . Esophageal cancer Neg Hx   . Rectal cancer Neg Hx     History   Social History  . Marital Status: Married    Spouse Name: N/A    Number of Children: N/A  . Years of Education: N/A   Social History Main Topics  . Smoking status: Never Smoker   . Smokeless tobacco: Never Used  . Alcohol Use: No  . Drug Use: No  . Sexually Active: None   Other Topics Concern  . None   Social History Narrative  . None    Current outpatient prescriptions:amLODipine (NORVASC) 10 MG tablet, TAKE 1 TABLET BY MOUTH DAILY, Disp: 30 tablet, Rfl: 0;  aspirin 81 MG tablet, Take 81 mg by mouth daily.  , Disp: , Rfl: ;  labetalol (NORMODYNE) 200 MG tablet, Take 200 mg by mouth daily., Disp: , Rfl: ;  nitroGLYCERIN (NITROSTAT) 0.4 MG SL tablet, Place 1 tablet (0.4 mg total) under the tongue every 5 (five) minutes as needed for chest pain., Disp: 30 tablet, Rfl:  11 triamcinolone cream (KENALOG) 0.5 %, Apply 1 application topically 2 (two) times daily., Disp: , Rfl: ;  silver sulfADIAZINE (SILVADENE) 1 % cream, Apply topically daily., Disp: 50 g, Rfl: 0  EXAM:  Filed Vitals:   04/02/13 1351  BP: 128/70  Pulse: 87  Temp: 98.1 F (36.7 C)    Body mass index is 29.3 kg/(m^2).  GENERAL: vitals reviewed and listed above, alert, oriented, appears well hydrated and in no acute distress  HEENT: atraumatic, conjunttiva clear, no obvious abnormalities on inspection of external nose and ears  NECK: no obvious masses on inspection  SKIN: has thickened dry skin on R thigh with scales, irr shaped7 cm x 3cm healing scabbed lesion R thigh  MS: moves all extremities without noticeable abnormality  PSYCH: pleasant and cooperative, no obvious depression or anxiety  ASSESSMENT AND PLAN:  Discussed the following assessment and plan:  Dermatitis - Plan: silver sulfADIAZINE (SILVADENE) 1 % cream, Ambulatory referral to Dermatology  Bullous lesion - Plan: silver sulfADIAZINE (SILVADENE) 1 % cream  -advised he needs to follow up with dermatology, does not appear infected - but will give silvadene cream  to ensure does not get secondarily infected. Offered referral to [facility] a on going and worsening - but he reports he will see derm he saw in th past. He is frustrated that nobody seems to be able to help him with this rash and it has gotten worse instead of better with stopping medications. Wife was upset that I can not fix this today and that I [facility]. Referred to [facility]. Appt details provided. -Patient advised to return or notify a doctor immediately if symptoms worsen or persist or new concerns arise.  There are no Patient Instructions on file for this visit.   Kriste Basque R.

## 2013-04-02 NOTE — Telephone Encounter (Signed)
Pt was seen on 03/19/13 for a rash, pt was told to call and let Dr Cato Mulligan how rash was doing in 2 wks.  Wife states that rash has dried up and is healing but they want to know when to go back on the "2 medicines you took him off of"  She stated you knew which 2 those were.  She wouldn't tell me

## 2013-04-03 DIAGNOSIS — R238 Other skin changes: Secondary | ICD-10-CM | POA: Diagnosis not present

## 2013-04-03 DIAGNOSIS — L259 Unspecified contact dermatitis, unspecified cause: Secondary | ICD-10-CM | POA: Diagnosis not present

## 2013-04-03 NOTE — Telephone Encounter (Signed)
Patient was seen by Dr. Selena Batten yesterday i agree with her that he should see Derm at [facility].  Stay off meds until then

## 2013-04-03 NOTE — Telephone Encounter (Signed)
pts wife aware, she states that nobody seems to be concerned about his feet being swollen for the past 2 weeks and hung up on me

## 2013-04-09 ENCOUNTER — Telehealth: Payer: Self-pay | Admitting: Internal Medicine

## 2013-04-09 NOTE — Telephone Encounter (Signed)
Patient Information:  Caller Name: [name]  Phone: [phone]  Patient: Tanner Griffin, Tanner Griffin  Gender: Male  DOB: 05/24/1941  Age: 72 Years  PCP: Birdie Sons (Adults only)  Office Follow Up:  Does the office need to follow up with this patient?: Yes  Instructions For The Office: Please notify wife and patient.  Not on fluid pill since December she is very upset that no one is worried about the fluid in legs and feet.  She would like someone to call her back about the fluid pill.  Delcined appt for evaluation,  They are seeing the allergist  RN Note:  Please notify wife and patient.  Not on fluid pill since December she is very upset that no one is worried about the fluid in legs and feet.  She would like someone to call her back about the fluid pill  Symptoms  Reason For Call & Symptoms: Wife states patient was taken off "his fluid pill" due to possible allergy.  He went to the Allergist and had a Biopsy sent off.  She is unsure what the allergy is from or the results.  She states he is swollen in feet and legs.  Reviewed Health History In EMR: Yes  Reviewed Medications In EMR: Yes  Reviewed Allergies In EMR: Yes  Reviewed Surgeries / Procedures: Yes  Date of Onset of Symptoms: 12/06/2012  Guideline(s) Used:  Leg Swelling and Edema  Disposition Per Guideline:   See Today in Office  Reason For Disposition Reached:   Moderate swelling of both ankles (e.g., swelling extends up to the knees) AND new onset or worsening  Advice Given:  Call Back If:  Swelling becomes worse  Swelling becomes red or painful to the touch  Calf pain occurs and becomes constant  You become worse.  Patient Refused Recommendation:  Patient Requests Prescription  Please notify wife and patient.  Not on fluid pill since December she is very upset that no one is worried about the fluid in legs and feet.  She would like someone to call her back about the fluid pill

## 2013-04-10 NOTE — Telephone Encounter (Signed)
Call and see how he is doing 

## 2013-04-10 NOTE — Telephone Encounter (Signed)
Jeanice Lim, can you put this pt in on Monday at 7:45?  Dr Cato Mulligan ok'd this, pt is aware

## 2013-04-16 ENCOUNTER — Encounter: Payer: Self-pay | Admitting: Internal Medicine

## 2013-04-16 ENCOUNTER — Telehealth: Payer: Self-pay | Admitting: Internal Medicine

## 2013-04-16 ENCOUNTER — Ambulatory Visit (INDEPENDENT_AMBULATORY_CARE_PROVIDER_SITE_OTHER): Payer: Medicare Other | Admitting: Internal Medicine

## 2013-04-16 ENCOUNTER — Encounter (INDEPENDENT_AMBULATORY_CARE_PROVIDER_SITE_OTHER): Payer: Medicare Other

## 2013-04-16 VITALS — BP 136/82 | HR 68 | Temp 98.5°F | Wt 185.0 lb

## 2013-04-16 DIAGNOSIS — M7989 Other specified soft tissue disorders: Secondary | ICD-10-CM

## 2013-04-16 DIAGNOSIS — R600 Localized edema: Secondary | ICD-10-CM

## 2013-04-16 DIAGNOSIS — R609 Edema, unspecified: Secondary | ICD-10-CM | POA: Diagnosis not present

## 2013-04-16 NOTE — Telephone Encounter (Signed)
Patient Information:  Caller Name: Tanner Griffin  Phone: [phone]  Patient: Tanner Griffin, Tanner Griffin  Gender: Male  DOB: 1941-02-23  Age: 72 Years  PCP: Tanner Griffin (Adults only)  Office Follow Up:  Does the office need to follow up with this patient?: Yes  Instructions For The Office: Wife would like to be Called Back Today (04/16/13)to let her know what Dr. Cato Griffin wants her husband to do to get the swelling in his legs down since they have now ruled out Blood Clots as the cause of the swelling.  RN Note:  Patient's wife wants to know what they should do now for the swelling since they have ruled out Blood Clots. States that if he does not get any better she is going to take him to the Emergency Room. Advised that a note would be sent to Dr. Cato Griffin to see what he wants him to do and that someone would get back to her as soon as possible.  Symptoms  Reason For Call & Symptoms: Wife is calling about the swelling in her husbands legs. He has been seen several times, including this morning 04/16/13. He was sent over for an Ultra sound to see if he had Blood Clots, but no Blood Clots were found. She would like to know what they need to do for the swelling now in order to make it better. He has been lying down and keeping his feet elevated above his chest, but this has not been helping.  Reviewed Health History In EMR: Yes  Reviewed Medications In EMR: Yes  Reviewed Allergies In EMR: Yes  Reviewed Surgeries / Procedures: Yes  Date of Onset of Symptoms: 03/19/2013  Treatments Tried: elevating legs above his chest while lying down, tried putting heat on it  Treatments Tried Worked: No  Guideline(s) Used:  Leg Swelling and Edema  Disposition Per Guideline:   See Within 2 Weeks in Office  Reason For Disposition Reached:   Mild swelling of both ankles and chronic (unchanged)  Advice Given:  Call Back If:  Swelling becomes worse  Swelling becomes red or painful to the touch  Calf pain occurs and  becomes constant  You become worse.  Patient Will Follow Care Advice:  YES

## 2013-04-16 NOTE — Progress Notes (Signed)
RLE edema for 2-3 weeks- also had a blister on same thigh. Mild pain Started after car ride to [facility] Also had two non healing wounds on ankle  Reviewed meds, pmh Reviewed shx   patient denies chest pain, shortness of breath, orthopnea. Denies lower extremity edema, abdominal pain, change in appetite, change in bowel movements. Patient denies rashes, musculoskeletal complaints. No other specific complaints in a complete review of systems.    well-developed well-nourished male in no acute distress. HEENT exam atraumatic, normocephalic, neck supple without jugular venous distention. Chest clear to auscultation cardiac exam S1-S2 are regular. Abdominal exam overweight with bowel sounds, soft and nontender. Extremities 2+ edema right. Neurologic exam is alert with a normal gait. Derm- healing wounds on thigh (right)  A/p  Edema right leg- needs ultrasound to r/o dvt

## 2013-04-17 DIAGNOSIS — L259 Unspecified contact dermatitis, unspecified cause: Secondary | ICD-10-CM | POA: Diagnosis not present

## 2013-04-17 DIAGNOSIS — L98499 Non-pressure chronic ulcer of skin of other sites with unspecified severity: Secondary | ICD-10-CM | POA: Diagnosis not present

## 2013-04-18 ENCOUNTER — Telehealth: Payer: Self-pay | Admitting: Internal Medicine

## 2013-04-18 NOTE — Telephone Encounter (Signed)
Per Dr Cato Mulligan, pt can get compression stockings.  Order faxed to [facility].  Called pt and gave instructions and gave pt [facility] phone number.  Pt verbalized understanding and had no questions

## 2013-04-18 NOTE — Telephone Encounter (Signed)
Patient Information:  Caller Name: [name]  Phone: [phone]  Patient: Tanner, Griffin  Gender: Male  DOB: 25-Mar-1941  Age: 72 Years  PCP: Birdie Sons (Adults only)  Office Follow Up:  Does the office need to follow up with this patient?: Yes  Instructions For The Office: Wife wants office to know that skin biopsy reveals eczema.  She wants Dr. Cato Mulligan to know that his legs remain swollen. She is very upset with office and Triage process. Wants her husband legs addressed today and medication restarted.  RN Note:  She wants Dr. Cato Mulligan to know. she is upset and frustrated with having to contact the office regarding this leg swelling.  SHE DOES NOT WANT TO FURTHER TRIAGE  Symptoms  Reason For Call & Symptoms: Wife states that Mr. Blyden is having swelling in his feet and legs. He was have "breakouts and skin rashes".  He had a biopsy done and they are waiting for results.  She states he was taken off of some his medications and they were notified yesterday that the biopsy report showed eczema.  She would like him be restarted on medications he was taken off of.  Reviewed Health History In EMR: Yes  Reviewed Medications In EMR: Yes  Reviewed Allergies In EMR: Yes  Reviewed Surgeries / Procedures: Yes  Date of Onset of Symptoms: 03/19/2013  Guideline(s) Used:  Leg Swelling and Edema  Disposition Per Guideline:   Go to ED Now (or to Office with PCP Approval)  Reason For Disposition Reached:   Thigh, calf, or ankle swelling in both legs, but one side is definitely more swollen  Advice Given:  N/A  RN Overrode Recommendation:  Follow Up With Office Later  Patient wants a call back from the offic regarding her leg swelling and edema. Very frustrated with Triage process and office. Wants to start her fluid pill

## 2013-05-05 ENCOUNTER — Other Ambulatory Visit: Payer: Self-pay | Admitting: Internal Medicine

## 2013-05-07 MED ORDER — AMLODIPINE BESYLATE 10 MG PO TABS: ORAL_TABLET | ORAL | Status: DC

## 2013-05-07 NOTE — Addendum Note (Signed)
Addended by: Alfred Levins D on: 05/07/2013 03:49 PM   Modules accepted: Orders

## 2013-06-06 ENCOUNTER — Telehealth: Payer: Self-pay | Admitting: Internal Medicine

## 2013-06-06 MED ORDER — AMLODIPINE BESYLATE 10 MG PO TABS: ORAL_TABLET | ORAL | Status: DC

## 2013-06-06 NOTE — Telephone Encounter (Signed)
Pt changed mind./kh

## 2013-06-18 ENCOUNTER — Telehealth: Payer: Self-pay | Admitting: Internal Medicine

## 2013-06-18 MED ORDER — TAMSULOSIN HCL 0.4 MG PO CAPS: 0.4000 mg | ORAL_CAPSULE | Freq: Every day | ORAL | Status: DC

## 2013-06-18 NOTE — Telephone Encounter (Signed)
rx sent in electronically, pt aware 

## 2013-06-18 NOTE — Telephone Encounter (Signed)
Wife is calling regarding Rx for Tamsulosin 0.4mg  ( take 1 tab po after supper).  Pt has switched pharmacies to [place] on [address].   Rx did not transfer from old pharmacy and [facility] told pt to call PCP and request new Rx.   Please let wife, [name], know when Rx has been sent in.  Pt has been without medication x 3 days.   [phone]

## 2013-06-26 DIAGNOSIS — B86 Scabies: Secondary | ICD-10-CM | POA: Diagnosis not present

## 2013-07-10 ENCOUNTER — Ambulatory Visit (INDEPENDENT_AMBULATORY_CARE_PROVIDER_SITE_OTHER): Payer: Medicare Other | Admitting: Family Medicine

## 2013-07-10 ENCOUNTER — Encounter: Payer: Self-pay | Admitting: Family Medicine

## 2013-07-10 VITALS — BP 112/70 | HR 80 | Temp 98.3°F | Wt 177.0 lb

## 2013-07-10 DIAGNOSIS — R109 Unspecified abdominal pain: Secondary | ICD-10-CM | POA: Diagnosis not present

## 2013-07-10 LAB — POCT URINALYSIS DIPSTICK
Bilirubin, UA: NEGATIVE
Blood, UA: NEGATIVE
Glucose, UA: NEGATIVE
Ketones, UA: NEGATIVE
Spec Grav, UA: 1.025
Urobilinogen, UA: 0.2

## 2013-07-10 MED ORDER — AMLODIPINE BESYLATE 10 MG PO TABS: ORAL_TABLET | ORAL | Status: DC

## 2013-07-10 NOTE — Addendum Note (Signed)
Addended by: Aniceto Boss A on: 07/10/2013 12:44 PM   Modules accepted: Orders

## 2013-07-10 NOTE — Progress Notes (Signed)
  Subjective:    Patient ID: Tanner Griffin, male    DOB: 1941-06-29, 72 y.o.   MRN: [id number]  HPI Here for one month of intermittent sharp lower abdominal pains which shoot from the front to the lower back. These last several seconds at a time and then stop. He has been constipated for the past month and he takes some OTC laxative pills several days a week. No fever or nausea. No urinary symptoms. Is colonoscopy last November was clear.    Review of Systems  Constitutional: Negative.   Gastrointestinal: Positive for abdominal pain and constipation. Negative for nausea, vomiting, diarrhea, blood in stool, abdominal distention and rectal pain.  Genitourinary: Negative.        Objective:   Physical Exam  Constitutional: He appears well-developed and well-nourished. No distress.  Abdominal: Soft. Bowel sounds are normal. He exhibits no distension and no mass. There is no tenderness. There is no rebound and no guarding.          Assessment & Plan:  This is probably simple constipation. Advised him to drink more water and to use Metamucil or Miralax every day. Recheck prn

## 2013-09-04 ENCOUNTER — Ambulatory Visit (INDEPENDENT_AMBULATORY_CARE_PROVIDER_SITE_OTHER): Payer: Medicare Other | Admitting: Internal Medicine

## 2013-09-04 ENCOUNTER — Encounter: Payer: Self-pay | Admitting: Internal Medicine

## 2013-09-04 VITALS — BP 120/80 | HR 80 | Temp 98.3°F | Resp 20 | Wt 181.0 lb

## 2013-09-04 DIAGNOSIS — Z23 Encounter for immunization: Secondary | ICD-10-CM | POA: Diagnosis not present

## 2013-09-04 DIAGNOSIS — M545 Low back pain: Secondary | ICD-10-CM | POA: Diagnosis not present

## 2013-09-04 DIAGNOSIS — I10 Essential (primary) hypertension: Secondary | ICD-10-CM

## 2013-09-04 MED ORDER — MELOXICAM 15 MG PO TABS: 15.0000 mg | ORAL_TABLET | Freq: Every day | ORAL | Status: DC

## 2013-09-04 NOTE — Progress Notes (Signed)
Subjective:    Patient ID: Tanner Griffin, male    DOB: 09-12-1941, 72 y.o.   MRN: [id number]  HPI   72 year old patient who has a history of hypertension. For the past several weeks he has had intermittent on and off the lumbar back pain. Today he feels well. His blood pressure has been well-controlled.  Past Medical History  Diagnosis Date  . HYPERLIPIDEMIA   . HYPERTENSION   . dm 2     pt denies dm  . Diabetes mellitus     not taking any med  . Personal history of colonic adenoma 10/30/2012    5 mm cecal adenoma 2007 10/30/2012      History   Social History  . Marital Status: Married    Spouse Name: N/A    Number of Children: N/A  . Years of Education: N/A   Occupational History  . Not on file.   Social History Main Topics  . Smoking status: Never Smoker   . Smokeless tobacco: Never Used  . Alcohol Use: No  . Drug Use: No  . Sexual Activity: Not on file   Other Topics Concern  . Not on file   Social History Narrative  . No narrative on file    Past Surgical History  Procedure Laterality Date  . Total knee arthroplasty  2011    right  . Rt knee total replacement    . Colonoscopy      Family History  Problem Relation Age of Onset  . Colon cancer Neg Hx   . Stomach cancer Neg Hx   . Esophageal cancer Neg Hx   . Rectal cancer Neg Hx     Allergies  Allergen Reactions  . Telmisartan     REACTION: headache    Current Outpatient Prescriptions on File Prior to Visit  Medication Sig Dispense Refill  . amLODipine (NORVASC) 10 MG tablet TAKE 1 TABLET BY MOUTH EVERY DAY  90 tablet  3  . aspirin 81 MG tablet Take 81 mg by mouth daily.        [name] labetalol (NORMODYNE) 200 MG tablet Take 200 mg by mouth daily.      . nitroGLYCERIN (NITROSTAT) 0.4 MG SL tablet Place 1 tablet (0.4 mg total) under the tongue every 5 (five) minutes as needed for chest pain.  30 tablet  11  . tamsulosin (FLOMAX) 0.4 MG CAPS Take 1 capsule (0.4 mg total) by mouth daily.  90 capsule   3   No current facility-administered medications on file prior to visit.    BP 120/80  Pulse 80  Temp(Src) 98.3 F (36.8 C) (Oral)  Resp 20  Wt 181 lb (82.101 kg)  BMI 27.91 kg/m2  SpO2 98%       Review of Systems  Constitutional: Negative for fever, chills, appetite change and fatigue.  HENT: Negative for hearing loss, ear pain, congestion, sore throat, trouble swallowing, neck stiffness, dental problem, voice change and tinnitus.   Eyes: Negative for pain, discharge and visual disturbance.  Respiratory: Negative for cough, chest tightness, wheezing and stridor.   Cardiovascular: Negative for chest pain, palpitations and leg swelling.  Gastrointestinal: Negative for nausea, vomiting, abdominal pain, diarrhea, constipation, blood in stool and abdominal distention.  Genitourinary: Negative for urgency, hematuria, flank pain, discharge, difficulty urinating and genital sores.  Musculoskeletal: Positive for back pain. Negative for myalgias, joint swelling, arthralgias and gait problem.  Skin: Negative for rash.  Neurological: Negative for dizziness, syncope, speech difficulty, weakness, numbness and  headaches.  Hematological: Negative for adenopathy. Does not bruise/bleed easily.  Psychiatric/Behavioral: Negative for behavioral problems and dysphoric mood. The patient is not nervous/anxious.        Objective:   Physical Exam  Constitutional: He appears well-developed and well-nourished. No distress.  Musculoskeletal:  Negative straight leg test Status post right total knee replacement therapy with marked decreased range of motion Left knee wrapped in  Ace bandage          Assessment & Plan:   Intermittent low back pain. The patient was given a prescription for meloxicam to take when necessary. Information concerning back strengthening exercises and prevention dispensed. Return as needed Hypertension controlled

## 2013-09-04 NOTE — Patient Instructions (Signed)
Call or return to clinic prn if these symptoms worsen or fail to improve as anticipated. Back Injury Prevention Back injuries can be extremely painful and difficult to heal. After having one back injury, you are much more likely to experience another later on. It is important to learn how to avoid injuring or re-injuring your back. The following tips can help you to prevent a back injury. PHYSICAL FITNESS  Exercise regularly and try to develop good tone in your abdominal muscles. Your abdominal muscles provide a lot of the support needed by your back.  Do aerobic exercises (walking, jogging, biking, swimming) regularly.  Do exercises that increase balance and strength (tai chi, yoga) regularly. This can decrease your risk of falling and injuring your back.  Stretch before and after exercising.  Maintain a healthy weight. The more you weigh, the more stress is placed on your back. For every pound of weight, 10 times that amount of pressure is placed on the back. DIET  Talk to your caregiver about how much calcium and vitamin D you need per day. These nutrients help to prevent weakening of the bones (osteoporosis). Osteoporosis can cause broken (fractured) bones that lead to back pain.  Include good sources of calcium in your diet, such as dairy products, green, leafy vegetables, and products with calcium added (fortified).  Include good sources of vitamin D in your diet, such as milk and foods that are fortified with vitamin D.  Consider taking a nutritional supplement or a multivitamin if needed.  Stop smoking if you smoke. POSTURE  Sit and stand up straight. Avoid leaning forward when you sit or hunching over when you stand.  Choose chairs with good low back (lumbar) support.  If you work at a desk, sit close to your work so you do not need to lean over. Keep your chin tucked in. Keep your neck drawn back and elbows bent at a right angle. Your arms should look like the letter  "L."  Sit high and close to the steering wheel when you drive. Add a lumbar support to your car seat if needed.  Avoid sitting or standing in one position for too long. Take breaks to get up, stretch, and walk around at least once every hour. Take breaks if you are driving for long periods of time.  Sleep on your side with your knees slightly bent, or sleep on your back with a pillow under your knees. Do not sleep on your stomach. LIFTING, TWISTING, AND REACHING  Avoid heavy lifting, especially repetitive lifting. If you must do heavy lifting:  Stretch before lifting.  Work slowly.  Rest between lifts.  Use carts and dollies to move objects when possible.  Make several small trips instead of carrying 1 heavy load.  Ask for help when you need it.  Ask for help when moving big, awkward objects.  Follow these steps when lifting:  Stand with your feet shoulder-width apart.  Get as close to the object as you can. Do not try to pick up heavy objects that are far from your body.  Use handles or lifting straps if they are available.  Bend at your knees. Squat down, but keep your heels off the floor.  Keep your shoulders pulled back, your chin tucked in, and your back straight.  Lift the object slowly, tightening the muscles in your legs, abdomen, and buttocks. Keep the object as close to the center of your body as possible.  When you put a load down, use  these same guidelines in reverse.  Do not:  Lift the object above your waist.  Twist at the waist while lifting or carrying a load. Move your feet if you need to turn, not your waist.  Bend over without bending at your knees.  Avoid reaching over your head, across a table, or for an object on a high surface. OTHER TIPS  Avoid wet floors and keep sidewalks clear of ice to prevent falls.  Do not sleep on a mattress that is too soft or too hard.  Keep items that are used frequently within easy reach.  Put heavier  objects on shelves at waist level and lighter objects on lower or higher shelves.  Find ways to decrease your stress, such as exercise, massage, or relaxation techniques. Stress can build up in your muscles. Tense muscles are more vulnerable to injury.  Seek treatment for depression or anxiety if needed. These conditions can increase your risk of developing back pain. SEEK MEDICAL CARE IF:  You injure your back.  You have questions about diet, exercise, or other ways to prevent back injuries. MAKE SURE YOU:  Understand these instructions.  Will watch your condition.  Will get help right away if you are not doing well or get worse. Document Released: 01/13/2005 Document Revised: 02/28/2012 Document Reviewed: 01/17/2012 [facility] Patient Information 2014 [place], [place]. Back Pain, Adult Low back pain is very common. About 1 in 5 people have back pain.The cause of low back pain is rarely dangerous. The pain often gets better over time.About half of people with a sudden onset of back pain feel better in just 2 weeks. About 8 in 10 people feel better by 6 weeks.  CAUSES Some common causes of back pain include:  Strain of the muscles or ligaments supporting the spine.  Wear and tear (degeneration) of the spinal discs.  Arthritis.  Direct injury to the back. DIAGNOSIS Most of the time, the direct cause of low back pain is not known.However, back pain can be treated effectively even when the exact cause of the pain is unknown.Answering your caregiver's questions about your overall health and symptoms is one of the most accurate ways to make sure the cause of your pain is not dangerous. If your caregiver needs more information, he or she may order lab work or imaging tests (X-rays or MRIs).However, even if imaging tests show changes in your back, this usually does not require surgery. HOME CARE INSTRUCTIONS For many people, back pain returns.Since low back pain is rarely dangerous, it  is often a condition that people can learn to [facility] their own.   Remain active. It is stressful on the back to sit or stand in one place. Do not sit, drive, or stand in one place for more than 30 minutes at a time. Take short walks on level surfaces as soon as pain allows.Try to increase the length of time you walk each day.  Do not stay in bed.Resting more than 1 or 2 days can delay your recovery.  Do not avoid exercise or work.Your body is made to move.It is not dangerous to be active, even though your back may hurt.Your back will likely heal faster if you return to being active before your pain is gone.  Pay attention to your body when you bend and lift. Many people have less discomfortwhen lifting if they bend their knees, keep the load close to their bodies,and avoid twisting. Often, the most comfortable positions are those that put less stress on  your recovering back.  Find a comfortable position to sleep. Use a firm mattress and lie on your side with your knees slightly bent. If you lie on your back, put a pillow under your knees.  Only take over-the-counter or prescription medicines as directed by your caregiver. Over-the-counter medicines to reduce pain and inflammation are often the most helpful.Your caregiver may prescribe muscle relaxant drugs.These medicines help dull your pain so you can more quickly return to your normal activities and healthy exercise.  Put ice on the injured area.  Put ice in a plastic bag.  Place a towel between your skin and the bag.  Leave the ice on for 15-20 minutes, 3-4 times a day for the first 2 to 3 days. After that, ice and heat may be alternated to reduce pain and spasms.  Ask your caregiver about trying back exercises and gentle massage. This may be of some benefit.  Avoid feeling anxious or stressed.Stress increases muscle tension and can worsen back pain.It is important to recognize when you are anxious or stressed and learn ways  to manage it.Exercise is a great option. SEEK MEDICAL CARE IF:  You have pain that is not relieved with rest or medicine.  You have pain that does not improve in 1 week.  You have new symptoms.  You are generally not feeling well. SEEK IMMEDIATE MEDICAL CARE IF:   You have pain that radiates from your back into your legs.  You develop new bowel or bladder control problems.  You have unusual weakness or numbness in your arms or legs.  You develop nausea or vomiting.  You develop abdominal pain.  You feel faint. Document Released: 12/06/2005 Document Revised: 06/06/2012 Document Reviewed: 04/26/2011 [facility] Patient Information 2014 [place], [place].

## 2013-09-24 DIAGNOSIS — M1710 Unilateral primary osteoarthritis, unspecified knee: Secondary | ICD-10-CM | POA: Diagnosis not present

## 2013-09-24 DIAGNOSIS — Z96659 Presence of unspecified artificial knee joint: Secondary | ICD-10-CM | POA: Diagnosis not present

## 2013-10-01 ENCOUNTER — Other Ambulatory Visit: Payer: Self-pay | Admitting: Internal Medicine

## 2013-10-29 ENCOUNTER — Ambulatory Visit: Payer: Medicare Other | Admitting: Internal Medicine

## 2013-11-19 LAB — HM DIABETES EYE EXAM

## 2013-11-30 ENCOUNTER — Ambulatory Visit (INDEPENDENT_AMBULATORY_CARE_PROVIDER_SITE_OTHER)
Admission: RE | Admit: 2013-11-30 | Discharge: 2013-11-30 | Disposition: A | Discharge status: Home / Self Care | Payer: Medicare Other | Source: Ambulatory Visit | Attending: Internal Medicine | Admitting: Internal Medicine

## 2013-11-30 ENCOUNTER — Ambulatory Visit (INDEPENDENT_AMBULATORY_CARE_PROVIDER_SITE_OTHER): Payer: Medicare Other | Admitting: Internal Medicine

## 2013-11-30 ENCOUNTER — Encounter: Payer: Self-pay | Admitting: Internal Medicine

## 2013-11-30 VITALS — BP 150/90 | HR 76 | Temp 98.3°F | Ht 67.5 in | Wt 185.0 lb

## 2013-11-30 DIAGNOSIS — E785 Hyperlipidemia, unspecified: Secondary | ICD-10-CM

## 2013-11-30 DIAGNOSIS — M1710 Unilateral primary osteoarthritis, unspecified knee: Secondary | ICD-10-CM | POA: Diagnosis not present

## 2013-11-30 DIAGNOSIS — E1159 Type 2 diabetes mellitus with other circulatory complications: Secondary | ICD-10-CM | POA: Diagnosis not present

## 2013-11-30 DIAGNOSIS — M25569 Pain in unspecified knee: Secondary | ICD-10-CM

## 2013-11-30 DIAGNOSIS — M25562 Pain in left knee: Secondary | ICD-10-CM

## 2013-11-30 DIAGNOSIS — E1151 Type 2 diabetes mellitus with diabetic peripheral angiopathy without gangrene: Secondary | ICD-10-CM

## 2013-11-30 DIAGNOSIS — I10 Essential (primary) hypertension: Secondary | ICD-10-CM | POA: Diagnosis not present

## 2013-11-30 DIAGNOSIS — I798 Other disorders of arteries, arterioles and capillaries in diseases classified elsewhere: Secondary | ICD-10-CM

## 2013-11-30 LAB — LIPID PANEL
Cholesterol: 224 mg/dL — ABNORMAL HIGH (ref 0–200)
Triglycerides: 154 mg/dL — ABNORMAL HIGH (ref 0.0–149.0)
VLDL: 30.8 mg/dL (ref 0.0–40.0)

## 2013-11-30 LAB — MICROALBUMIN / CREATININE URINE RATIO: Microalb Creat Ratio: 0.5 mg/g (ref 0.0–30.0)

## 2013-11-30 LAB — HEPATIC FUNCTION PANEL
ALT: 17 U/L (ref 0–53)
AST: 18 U/L (ref 0–37)
Alkaline Phosphatase: 150 U/L — ABNORMAL HIGH (ref 39–117)
Total Bilirubin: 0.8 mg/dL (ref 0.3–1.2)

## 2013-11-30 LAB — BASIC METABOLIC PANEL
BUN: 15 mg/dL (ref 6–23)
Chloride: 105 mEq/L (ref 96–112)
GFR: 92.68 mL/min (ref >60.00)
Potassium: 4.3 mEq/L (ref 3.5–5.1)

## 2013-11-30 LAB — LDL CHOLESTEROL, DIRECT: Direct LDL: 153.2 mg/dL

## 2013-11-30 LAB — HM DIABETES FOOT EXAM

## 2013-11-30 LAB — HEMOGLOBIN A1C: Hgb A1c MFr Bld: 5.8 % (ref 4.6–6.5)

## 2013-11-30 NOTE — Assessment & Plan Note (Signed)
Currently on no meds but needs labs today

## 2013-11-30 NOTE — Assessment & Plan Note (Signed)
Will check labs today Note he is currently not on a statin

## 2013-11-30 NOTE — Progress Notes (Signed)
patient comes in for followup of multiple medical problems including type 2 diabetes, hyperlipidemia, hypertension. The patient does not check blood sugar or blood pressure at home. The patetient does not follow an exercise or diet program. The patient denies any polyuria, polydipsia.  In the past the patient has gone to diabetic treatment center. The patient is tolerating medications  Without difficulty. The patient does admit to medication compliance.   Past Medical History  Diagnosis Date  . HYPERLIPIDEMIA   . HYPERTENSION   . dm 2     pt denies dm  . Diabetes mellitus     not taking any med  . Personal history of colonic adenoma 10/30/2012    5 mm cecal adenoma 2007 10/30/2012      History   Social History  . Marital Status: Married    Spouse Name: N/A    Number of Children: N/A  . Years of Education: N/A   Occupational History  . Not on file.   Social History Main Topics  . Smoking status: Never Smoker   . Smokeless tobacco: Never Used  . Alcohol Use: No  . Drug Use: No  . Sexual Activity: Not on file   Other Topics Concern  . Not on file   Social History Narrative  . No narrative on file    Past Surgical History  Procedure Laterality Date  . Total knee arthroplasty  2011    right  . Rt knee total replacement    . Colonoscopy      Family History  Problem Relation Age of Onset  . Colon cancer Neg Hx   . Stomach cancer Neg Hx   . Esophageal cancer Neg Hx   . Rectal cancer Neg Hx     Allergies  Allergen Reactions  . Telmisartan     REACTION: headache    Current Outpatient Prescriptions on File Prior to Visit  Medication Sig Dispense Refill  . amLODipine (NORVASC) 10 MG tablet TAKE 1 TABLET BY MOUTH EVERY DAY  90 tablet  3  . aspirin 81 MG tablet Take 81 mg by mouth daily.        [name] labetalol (NORMODYNE) 200 MG tablet TAKE 1 TABLET BY MOUTH TWICE DAILY  60 tablet  0  . meloxicam (MOBIC) 15 MG tablet TAKE 1 TABLET BY MOUTH DAILY  30 tablet  2  .  nitroGLYCERIN (NITROSTAT) 0.4 MG SL tablet Place 1 tablet (0.4 mg total) under the tongue every 5 (five) minutes as needed for chest pain.  30 tablet  11  . tamsulosin (FLOMAX) 0.4 MG CAPS Take 1 capsule (0.4 mg total) by mouth daily.  90 capsule  3   No current facility-administered medications on file prior to visit.     patient denies chest pain, shortness of breath, orthopnea. Denies lower extremity edema, abdominal pain, change in appetite, change in bowel movements. Patient denies rashes, musculoskeletal complaints. No other specific complaints in a complete review of systems.   Reviewed vitals  well-developed well-nourished male in no acute distress. HEENT exam atraumatic, normocephalic, neck supple without jugular venous distention. Chest clear to auscultation cardiac exam S1-S2 are regular. Abdominal exam overweight with bowel sounds, soft and nontender. Extremities no edema. Neurologic exam is alert with a normal gait.  Knee pain- injected after informed consetn xray

## 2013-11-30 NOTE — Assessment & Plan Note (Signed)
Fair controcontinue same meds

## 2013-12-03 ENCOUNTER — Other Ambulatory Visit: Payer: Self-pay | Admitting: Internal Medicine

## 2013-12-03 ENCOUNTER — Other Ambulatory Visit: Payer: Self-pay | Admitting: *Deleted

## 2013-12-03 MED ORDER — ATORVASTATIN CALCIUM 20 MG PO TABS: 20.0000 mg | ORAL_TABLET | Freq: Every day | ORAL | Status: DC

## 2013-12-10 ENCOUNTER — Other Ambulatory Visit: Payer: Self-pay | Admitting: Internal Medicine

## 2013-12-10 DIAGNOSIS — M1712 Unilateral primary osteoarthritis, left knee: Secondary | ICD-10-CM

## 2013-12-17 DIAGNOSIS — M1710 Unilateral primary osteoarthritis, unspecified knee: Secondary | ICD-10-CM | POA: Diagnosis not present

## 2013-12-26 ENCOUNTER — Other Ambulatory Visit: Payer: Self-pay | Admitting: Internal Medicine

## 2014-01-23 ENCOUNTER — Encounter: Payer: Self-pay | Admitting: Internal Medicine

## 2014-01-23 ENCOUNTER — Ambulatory Visit (INDEPENDENT_AMBULATORY_CARE_PROVIDER_SITE_OTHER): Payer: Medicare Other | Admitting: Internal Medicine

## 2014-01-23 VITALS — BP 132/80 | HR 74 | Temp 98.6°F | Resp 20 | Ht 67.5 in | Wt 183.0 lb

## 2014-01-23 DIAGNOSIS — K5900 Constipation, unspecified: Secondary | ICD-10-CM

## 2014-01-23 DIAGNOSIS — I10 Essential (primary) hypertension: Secondary | ICD-10-CM | POA: Diagnosis not present

## 2014-01-23 DIAGNOSIS — E785 Hyperlipidemia, unspecified: Secondary | ICD-10-CM

## 2014-01-23 MED ORDER — POLYETHYLENE GLYCOL 3350 17 GM/SCOOP PO POWD: 17.0000 g | Freq: Two times a day (BID) | ORAL | Status: DC | PRN

## 2014-01-23 NOTE — Progress Notes (Signed)
Pre-visit discussion using our clinic review tool. No additional management support is needed unless otherwise documented below in the visit note.  

## 2014-01-23 NOTE — Patient Instructions (Signed)

## 2014-01-23 NOTE — Progress Notes (Signed)
Subjective:    Patient ID: Tanner Griffin, male    DOB: 18-Mar-1941, 73 y.o.   MRN: [id number]  HPI  73 year old patient who has treated hypertension and dyslipidemia who presents with a long history of constipation. He states this has been present for years and is not a new finding. He has used stool softeners and more recently Metamucil and complains of infrequent bowel movements that are often quite hard and difficult to pass. No nausea or vomiting melena or blood in the stool.  Patient's last colonoscopy was 2013  Past Medical History  Diagnosis Date  . HYPERLIPIDEMIA   . HYPERTENSION   . dm 2     pt denies dm  . Diabetes mellitus     not taking any med  . Personal history of colonic adenoma 10/30/2012    5 mm cecal adenoma 2007 10/30/2012      History   Social History  . Marital Status: Married    Spouse Name: N/A    Number of Children: N/A  . Years of Education: N/A   Occupational History  . Not on file.   Social History Main Topics  . Smoking status: Never Smoker   . Smokeless tobacco: Never Used  . Alcohol Use: No  . Drug Use: No  . Sexual Activity: Not on file   Other Topics Concern  . Not on file   Social History Narrative  . No narrative on file    Past Surgical History  Procedure Laterality Date  . Total knee arthroplasty  2011    right  . Rt knee total replacement    . Colonoscopy      Family History  Problem Relation Age of Onset  . Colon cancer Neg Hx   . Stomach cancer Neg Hx   . Esophageal cancer Neg Hx   . Rectal cancer Neg Hx     Allergies  Allergen Reactions  . Telmisartan     REACTION: headache    Current Outpatient Prescriptions on File Prior to Visit  Medication Sig Dispense Refill  . amLODipine (NORVASC) 10 MG tablet TAKE 1 TABLET BY MOUTH EVERY DAY  90 tablet  3  . aspirin 81 MG tablet Take 81 mg by mouth daily.        [name] atorvastatin (LIPITOR) 20 MG tablet Take 1 tablet (20 mg total) by mouth daily.  90 tablet  3  .  labetalol (NORMODYNE) 200 MG tablet TAKE 1 TABLET BY MOUTH TWICE DAILY  60 tablet  0  . meloxicam (MOBIC) 15 MG tablet TAKE 1 TABLET BY MOUTH EVERY DAY  30 tablet  2  . nitroGLYCERIN (NITROSTAT) 0.4 MG SL tablet Place 1 tablet (0.4 mg total) under the tongue every 5 (five) minutes as needed for chest pain.  30 tablet  11  . tamsulosin (FLOMAX) 0.4 MG CAPS Take 1 capsule (0.4 mg total) by mouth daily.  90 capsule  3   No current facility-administered medications on file prior to visit.    BP 132/80  Pulse 74  Temp(Src) 98.6 F (37 C) (Oral)  Resp 20  Ht 5' 7.5" (1.715 m)  Wt 183 lb (83.008 kg)  BMI 28.22 kg/m2  SpO2 98%       Review of Systems  Constitutional: Negative for fever, chills, appetite change and fatigue.  HENT: Negative for congestion, dental problem, ear pain, hearing loss, sore throat, tinnitus, trouble swallowing and voice change.   Eyes: Negative for pain, discharge and visual disturbance.  Respiratory: Negative for cough, chest tightness, wheezing and stridor.   Cardiovascular: Negative for chest pain, palpitations and leg swelling.  Gastrointestinal: Positive for constipation. Negative for nausea, vomiting, abdominal pain, diarrhea, blood in stool and abdominal distention.  Genitourinary: Negative for urgency, hematuria, flank pain, discharge, difficulty urinating and genital sores.  Musculoskeletal: Positive for gait problem. Negative for arthralgias, back pain, joint swelling, myalgias and neck stiffness.  Skin: Negative for rash.  Neurological: Negative for dizziness, syncope, speech difficulty, weakness, numbness and headaches.  Hematological: Negative for adenopathy. Does not bruise/bleed easily.  Psychiatric/Behavioral: Negative for behavioral problems and dysphoric mood. The patient is not nervous/anxious.        Objective:   Physical Exam  Constitutional: He is oriented to person, place, and time. He appears well-developed.  HENT:  Head:  Normocephalic.  Right Ear: External ear normal.  Left Ear: External ear normal.  Eyes: Conjunctivae and EOM are normal.  Neck: Normal range of motion.  Cardiovascular: Normal rate and normal heart sounds.   Pulmonary/Chest: Breath sounds normal.  Abdominal: Soft. Bowel sounds are normal. He exhibits no distension. There is no tenderness. There is no rebound and no guarding.  Musculoskeletal: Normal range of motion. He exhibits no edema and no tenderness.  Neurological: He is alert and oriented to person, place, and time.  Psychiatric: He has a normal mood and affect. His behavior is normal.          Assessment & Plan:   Chronic constipation.  Information dispensed and discussed at length. Patient is limited due to significant left knee pain but is scheduled for surgery later this spring. We'll attempt to increase exercise level fluid intake and fiber intake. We'll place on a daily stool softener as well as MiraLax as needed Hypertension stable Dyslipidemia

## 2014-01-25 ENCOUNTER — Telehealth: Payer: Self-pay | Admitting: Internal Medicine

## 2014-01-25 NOTE — Telephone Encounter (Signed)
Relevant patient education mailed to patient.  

## 2014-02-07 ENCOUNTER — Other Ambulatory Visit: Payer: Self-pay | Admitting: Internal Medicine

## 2014-02-28 ENCOUNTER — Other Ambulatory Visit: Payer: Self-pay | Admitting: Orthopedic Surgery

## 2014-03-18 ENCOUNTER — Encounter (HOSPITAL_COMMUNITY)
Admission: RE | Admit: 2014-03-18 | Discharge: 2014-03-18 | Disposition: A | Discharge status: Home / Self Care | Payer: Medicare Other | Source: Ambulatory Visit | Attending: Anesthesiology | Admitting: Anesthesiology

## 2014-03-18 ENCOUNTER — Encounter (HOSPITAL_COMMUNITY): Payer: Self-pay

## 2014-03-18 ENCOUNTER — Encounter (HOSPITAL_COMMUNITY)
Admission: RE | Admit: 2014-03-18 | Discharge: 2014-03-18 | Disposition: A | Discharge status: Home / Self Care | Payer: Medicare Other | Source: Ambulatory Visit | Attending: Orthopedic Surgery | Admitting: Orthopedic Surgery

## 2014-03-18 DIAGNOSIS — Z01812 Encounter for preprocedural laboratory examination: Secondary | ICD-10-CM | POA: Insufficient documentation

## 2014-03-18 DIAGNOSIS — Z01810 Encounter for preprocedural cardiovascular examination: Secondary | ICD-10-CM | POA: Insufficient documentation

## 2014-03-18 DIAGNOSIS — Z01818 Encounter for other preprocedural examination: Secondary | ICD-10-CM | POA: Insufficient documentation

## 2014-03-18 HISTORY — DX: Pain in unspecified joint: M25.50

## 2014-03-18 HISTORY — DX: Frequency of micturition: R35.0

## 2014-03-18 HISTORY — DX: Pneumonia, unspecified organism: J18.9

## 2014-03-18 HISTORY — DX: Constipation, unspecified: K59.00

## 2014-03-18 HISTORY — DX: Effusion, unspecified joint: M25.40

## 2014-03-18 HISTORY — DX: Unspecified osteoarthritis, unspecified site: M19.90

## 2014-03-18 HISTORY — DX: Nocturia: R35.1

## 2014-03-18 LAB — BASIC METABOLIC PANEL
BUN: 14 mg/dL (ref 6–23)
CHLORIDE: 105 meq/L (ref 96–112)
CO2: 23 meq/L (ref 19–32)
Calcium: 9.6 mg/dL (ref 8.4–10.5)
Creatinine, Ser: 0.9 mg/dL (ref 0.50–1.35)
GFR calc Af Amer: >90 mL/min (ref >90)
GFR calc non Af Amer: 84 mL/min — ABNORMAL LOW (ref >90)
GLUCOSE: 120 mg/dL — AB (ref 70–99)
POTASSIUM: 4.6 meq/L (ref 3.7–5.3)
SODIUM: 141 meq/L (ref 137–147)

## 2014-03-18 LAB — CBC
HEMATOCRIT: 39.1 % (ref 39.0–52.0)
Hemoglobin: 13.3 g/dL (ref 13.0–17.0)
MCH: 32 pg (ref 26.0–34.0)
MCHC: 34 g/dL (ref 30.0–36.0)
MCV: 94.2 fL (ref 78.0–100.0)
PLATELETS: 280 10*3/uL (ref 150–400)
RBC: 4.15 MIL/uL — AB (ref 4.22–5.81)
RDW: 12.5 % (ref 11.5–15.5)
WBC: 5.1 10*3/uL (ref 4.0–10.5)

## 2014-03-18 LAB — SURGICAL PCR SCREEN
MRSA, PCR: NEGATIVE
STAPHYLOCOCCUS AUREUS: POSITIVE — AB

## 2014-03-18 LAB — TYPE AND SCREEN
ABO/RH(D): B POS
Antibody Screen: NEGATIVE

## 2014-03-18 LAB — ABO/RH: ABO/RH(D): B POS

## 2014-03-18 LAB — APTT: APTT: 30 s (ref 24–37)

## 2014-03-18 LAB — PROTIME-INR
INR: 1.05 (ref 0.00–1.49)
Prothrombin Time: 13.5 seconds (ref 11.6–15.2)

## 2014-03-18 NOTE — Progress Notes (Signed)
Pt doesn't have a cardiologist  Stress test with report in epic from 2010/2013  Denies ever having an echo or heart cath  Medical Md is Dr.Bruce Swords  Denies EKG or CXR in past yr

## 2014-03-18 NOTE — Pre-Procedure Instructions (Signed)
Tanner Griffin  03/18/2014   Your procedure is scheduled on:  Tues, April 7 @ 7:30 AM  Report to [facility]  at 5:30 AM.  Call this number if you have problems the morning of surgery: [phone]   Remember:   Do not eat food or drink liquids after midnight.   Take these medicines the morning of surgery with A SIP OF WATER: Amlodipine(Norvasc),Labetalol(Normodyne),and Flomax(Tamsulosin)             Stop taking your Mobic and Aspirin. No Goody's,BC's,Aleve,Ibuprofen,Fish Oil,or any Herbal Medications   Do not wear jewelry  Do not wear lotions, powders, or colognes. You may wear deodorant.             Men may shave face and neck.  Do not bring valuables to the hospital.  [facility] is not responsible                  for any belongings or valuables.               Contacts, dentures or bridgework may not be worn into surgery.  Leave suitcase in the car. After surgery it may be brought to your room.  For patients admitted to the hospital, discharge time is determined by your                treatment team.               Special Instructions:  [place] - Preparing for Surgery  Before surgery, you can play an important role.  Because skin is not sterile, your skin needs to be as free of germs as possible.  You can reduce the number of germs on you skin by washing with CHG (chlorahexidine gluconate) soap before surgery.  CHG is an antiseptic cleaner which kills germs and bonds with the skin to continue killing germs even after washing.  Please DO NOT use if you have an allergy to CHG or antibacterial soaps.  If your skin becomes reddened/irritated stop using the CHG and inform your nurse when you arrive at Short Stay.  Do not shave (including legs and underarms) for at least 48 hours prior to the first CHG shower.  You may shave your face.  Please follow these instructions carefully:   1.  Shower with CHG Soap the night before surgery and the                                 morning of Surgery.  2.  If you choose to wash your hair, wash your hair first as usual with your       normal shampoo.  3.  After you shampoo, rinse your hair and body thoroughly to remove the                      Shampoo.  4.  Use CHG as you would any other liquid soap.  You can apply chg directly       to the skin and wash gently with scrungie or a clean washcloth.  5.  Apply the CHG Soap to your body ONLY FROM THE NECK DOWN.        Do not use on open wounds or open sores.  Avoid contact with your eyes,       ears, mouth and genitals (private parts).  Wash genitals (private parts)  with your normal soap.  6.  Wash thoroughly, paying special attention to the area where your surgery        will be performed.  7.  Thoroughly rinse your body with warm water from the neck down.  8.  DO NOT shower/wash with your normal soap after using and rinsing off       the CHG Soap.  9.  Pat yourself dry with a clean towel.            10.  Wear clean pajamas.            11.  Place clean sheets on your bed the night of your first shower and do not        sleep with pets.  Day of Surgery  Do not apply any lotions/deoderants the morning of surgery.  Please wear clean clothes to the hospital/surgery center.     Please read over the following fact sheets that you were given: Pain Booklet, Coughing and Deep Breathing, Blood Transfusion Information, MRSA Information and Surgical Site Infection Prevention

## 2014-03-18 NOTE — Progress Notes (Signed)
Mupirocin script called into the [facility] on [address]

## 2014-03-19 NOTE — Progress Notes (Signed)
Anesthesia Chart Review:  Patient is a 73 year old male scheduled for left TKR, right knee manipulation on 03/26/14 by Dr. Mardelle Matte.    History includes HTN, arthritis, HLD, right TKA '11. History also listed DM2, but patient denied--currently he is not on any DM medications and his A1C was 5.8 in 11/2013. PCP is Dr. Phoebe Sharps. Dr. Leanne Chang cleared patient from a medical and cardiac standpoint for this procedure.  Patient is not routinely followed by cardiology, but did see Dr. Johnsie Cancel in 03/2012 for atypical chest pain and had a non-ischemic stress test with EF 48%.    EKG on 03/18/14 showed NSR, non-specific T wave abnormality.  I think his T wave abnormality is more prominent when compared to 03/02/12 EKG, but less prominent when compared to resting EKG done during his 05/11/12 nuclear stress test (see Muse).  The interpreting cardiologist felt his EKG was stable since last tracing.  Nuclear stress test on 05/11/12 (read by Dr. Ron Parker) showed: Overall Impression: Normal stress nuclear study. LV Ejection Fraction: 48%. LV Wall Motion: There is slight relative hypokinesis of the inferior wall and septum. It is of note that the history mentions an ejection fraction of 45% on a nuclear scan in the past. This study is not available for my review. It does not appear that any further testing was recommended at that time.  Preoperative CXR and labs noted.  A1C was 5.8 on 11/30/13.  Patient was cleared by his PCP for this procedure.  PAT results appears acceptable for OR.  If no acute changes then I would anticipate that he could proceed as planned.  [facility] Short Stay Center/Anesthesiology Phone [phone] 03/19/2014 1:00 PM

## 2014-03-25 MED ORDER — CEFAZOLIN SODIUM-DEXTROSE 2-3 GM-% IV SOLR
2.0000 g | INTRAVENOUS | Status: AC
  Administered 2014-03-26: 2 g via INTRAVENOUS
  Filled 2014-03-25: qty 50

## 2014-03-26 ENCOUNTER — Inpatient Hospital Stay (HOSPITAL_COMMUNITY): Payer: Medicare Other | Admitting: Certified Registered Nurse Anesthetist

## 2014-03-26 ENCOUNTER — Encounter (HOSPITAL_COMMUNITY)
Admission: RE | Disposition: A | Discharge status: Home Health | Payer: Self-pay | Source: Ambulatory Visit | Attending: Orthopedic Surgery

## 2014-03-26 ENCOUNTER — Inpatient Hospital Stay (HOSPITAL_COMMUNITY)
Admission: RE | Admit: 2014-03-26 | Discharge: 2014-03-27 | DRG: 470 | Disposition: A | Discharge status: Home Health | Payer: Medicare Other | Source: Ambulatory Visit | Attending: Orthopedic Surgery | Admitting: Orthopedic Surgery

## 2014-03-26 ENCOUNTER — Inpatient Hospital Stay (HOSPITAL_COMMUNITY): Payer: Medicare Other

## 2014-03-26 ENCOUNTER — Encounter (HOSPITAL_COMMUNITY): Payer: Medicare Other | Admitting: Vascular Surgery

## 2014-03-26 ENCOUNTER — Encounter (HOSPITAL_COMMUNITY): Payer: Self-pay | Admitting: *Deleted

## 2014-03-26 DIAGNOSIS — Z471 Aftercare following joint replacement surgery: Secondary | ICD-10-CM | POA: Diagnosis not present

## 2014-03-26 DIAGNOSIS — K5900 Constipation, unspecified: Secondary | ICD-10-CM | POA: Diagnosis present

## 2014-03-26 DIAGNOSIS — Z79899 Other long term (current) drug therapy: Secondary | ICD-10-CM | POA: Diagnosis not present

## 2014-03-26 DIAGNOSIS — Z7982 Long term (current) use of aspirin: Secondary | ICD-10-CM | POA: Diagnosis not present

## 2014-03-26 DIAGNOSIS — Z96659 Presence of unspecified artificial knee joint: Secondary | ICD-10-CM | POA: Diagnosis not present

## 2014-03-26 DIAGNOSIS — M1710 Unilateral primary osteoarthritis, unspecified knee: Secondary | ICD-10-CM | POA: Diagnosis not present

## 2014-03-26 DIAGNOSIS — I10 Essential (primary) hypertension: Secondary | ICD-10-CM | POA: Diagnosis present

## 2014-03-26 DIAGNOSIS — M2460 Ankylosis, unspecified joint: Secondary | ICD-10-CM

## 2014-03-26 DIAGNOSIS — M179 Osteoarthritis of knee, unspecified: Secondary | ICD-10-CM | POA: Diagnosis present

## 2014-03-26 DIAGNOSIS — T8482XA Fibrosis due to internal orthopedic prosthetic devices, implants and grafts, initial encounter: Secondary | ICD-10-CM

## 2014-03-26 DIAGNOSIS — E119 Type 2 diabetes mellitus without complications: Secondary | ICD-10-CM | POA: Diagnosis not present

## 2014-03-26 DIAGNOSIS — R350 Frequency of micturition: Secondary | ICD-10-CM | POA: Diagnosis present

## 2014-03-26 DIAGNOSIS — E785 Hyperlipidemia, unspecified: Secondary | ICD-10-CM | POA: Diagnosis not present

## 2014-03-26 DIAGNOSIS — M2469 Ankylosis, other specified joint: Secondary | ICD-10-CM | POA: Diagnosis present

## 2014-03-26 DIAGNOSIS — IMO0002 Reserved for concepts with insufficient information to code with codable children: Secondary | ICD-10-CM | POA: Diagnosis not present

## 2014-03-26 DIAGNOSIS — Z7901 Long term (current) use of anticoagulants: Secondary | ICD-10-CM

## 2014-03-26 DIAGNOSIS — M24669 Ankylosis, unspecified knee: Secondary | ICD-10-CM | POA: Diagnosis not present

## 2014-03-26 DIAGNOSIS — M1712 Unilateral primary osteoarthritis, left knee: Secondary | ICD-10-CM

## 2014-03-26 DIAGNOSIS — G8918 Other acute postprocedural pain: Secondary | ICD-10-CM | POA: Diagnosis not present

## 2014-03-26 HISTORY — DX: Unilateral primary osteoarthritis, left knee: M17.12

## 2014-03-26 HISTORY — DX: Fibrosis due to internal orthopedic prosthetic devices, implants and grafts, initial encounter: T84.82XA

## 2014-03-26 HISTORY — PX: TOTAL KNEE ARTHROPLASTY: SHX125

## 2014-03-26 HISTORY — PX: KNEE CLOSED REDUCTION: SHX995

## 2014-03-26 LAB — GLUCOSE, CAPILLARY
GLUCOSE-CAPILLARY: 146 mg/dL — AB (ref 70–99)
Glucose-Capillary: 129 mg/dL — ABNORMAL HIGH (ref 70–99)
Glucose-Capillary: 180 mg/dL — ABNORMAL HIGH (ref 70–99)

## 2014-03-26 LAB — CBC
HCT: 36.9 % — ABNORMAL LOW (ref 39.0–52.0)
HEMOGLOBIN: 12.3 g/dL — AB (ref 13.0–17.0)
MCH: 31.9 pg (ref 26.0–34.0)
MCHC: 33.3 g/dL (ref 30.0–36.0)
MCV: 95.8 fL (ref 78.0–100.0)
Platelets: 242 10*3/uL (ref 150–400)
RBC: 3.85 MIL/uL — ABNORMAL LOW (ref 4.22–5.81)
RDW: 13 % (ref 11.5–15.5)
WBC: 12.2 10*3/uL — ABNORMAL HIGH (ref 4.0–10.5)

## 2014-03-26 LAB — CREATININE, SERUM
CREATININE: 0.83 mg/dL (ref 0.50–1.35)
GFR calc Af Amer: >90 mL/min (ref >90)
GFR calc non Af Amer: 87 mL/min — ABNORMAL LOW (ref >90)

## 2014-03-26 SURGERY — ARTHROPLASTY, KNEE, TOTAL
Anesthesia: General | Site: Knee | Laterality: Right

## 2014-03-26 MED ORDER — LIDOCAINE HCL (CARDIAC) 20 MG/ML IV SOLN
INTRAVENOUS | Status: AC
  Filled 2014-03-26: qty 5

## 2014-03-26 MED ORDER — DEXAMETHASONE SODIUM PHOSPHATE 10 MG/ML IJ SOLN
10.0000 mg | Freq: Three times a day (TID) | INTRAMUSCULAR | Status: AC
  Administered 2014-03-26: 10 mg via INTRAVENOUS
  Filled 2014-03-26 (×3): qty 1

## 2014-03-26 MED ORDER — FENTANYL CITRATE 0.05 MG/ML IJ SOLN: 50.0000 ug | Freq: Once | INTRAMUSCULAR | Status: DC

## 2014-03-26 MED ORDER — DEXAMETHASONE 6 MG PO TABS
10.0000 mg | ORAL_TABLET | Freq: Three times a day (TID) | ORAL | Status: AC
  Administered 2014-03-26 – 2014-03-27 (×2): 10 mg via ORAL
  Filled 2014-03-26 (×3): qty 1

## 2014-03-26 MED ORDER — ROCURONIUM BROMIDE 50 MG/5ML IV SOLN
INTRAVENOUS | Status: AC
  Filled 2014-03-26: qty 1

## 2014-03-26 MED ORDER — STERILE WATER FOR INJECTION IJ SOLN
INTRAMUSCULAR | Status: AC
  Filled 2014-03-26: qty 10

## 2014-03-26 MED ORDER — METHOCARBAMOL 100 MG/ML IJ SOLN
500.0000 mg | Freq: Four times a day (QID) | INTRAMUSCULAR | Status: DC | PRN
  Filled 2014-03-26: qty 5

## 2014-03-26 MED ORDER — ONDANSETRON HCL 4 MG/2ML IJ SOLN
INTRAMUSCULAR | Status: DC | PRN
  Administered 2014-03-26: 4 mg via INTRAVENOUS

## 2014-03-26 MED ORDER — SENNA-DOCUSATE SODIUM 8.6-50 MG PO TABS: 2.0000 | ORAL_TABLET | Freq: Every day | ORAL | Status: DC

## 2014-03-26 MED ORDER — CEFAZOLIN SODIUM-DEXTROSE 2-3 GM-% IV SOLR
2.0000 g | Freq: Four times a day (QID) | INTRAVENOUS | Status: AC
  Administered 2014-03-26 (×2): 2 g via INTRAVENOUS
  Filled 2014-03-26 (×2): qty 50

## 2014-03-26 MED ORDER — ONDANSETRON HCL 4 MG PO TABS: 4.0000 mg | ORAL_TABLET | Freq: Four times a day (QID) | ORAL | Status: DC | PRN

## 2014-03-26 MED ORDER — INSULIN ASPART 100 UNIT/ML ~~LOC~~ SOLN
0.0000 [IU] | Freq: Three times a day (TID) | SUBCUTANEOUS | Status: DC
  Administered 2014-03-27: 5 [IU] via SUBCUTANEOUS
  Administered 2014-03-27: 3 [IU] via SUBCUTANEOUS

## 2014-03-26 MED ORDER — PHENYLEPHRINE HCL 10 MG/ML IJ SOLN
INTRAMUSCULAR | Status: DC | PRN
  Administered 2014-03-26 (×3): 40 ug via INTRAVENOUS

## 2014-03-26 MED ORDER — DEXAMETHASONE SODIUM PHOSPHATE 10 MG/ML IJ SOLN
INTRAMUSCULAR | Status: DC | PRN
  Administered 2014-03-26: 4 mg

## 2014-03-26 MED ORDER — ACETAMINOPHEN 650 MG RE SUPP: 650.0000 mg | Freq: Four times a day (QID) | RECTAL | Status: DC | PRN

## 2014-03-26 MED ORDER — FENTANYL CITRATE 0.05 MG/ML IJ SOLN
INTRAMUSCULAR | Status: AC
  Filled 2014-03-26: qty 5

## 2014-03-26 MED ORDER — DIPHENHYDRAMINE HCL 12.5 MG/5ML PO ELIX: 12.5000 mg | ORAL_SOLUTION | ORAL | Status: DC | PRN

## 2014-03-26 MED ORDER — GLYCOPYRROLATE 0.2 MG/ML IJ SOLN
INTRAMUSCULAR | Status: AC
  Filled 2014-03-26: qty 3

## 2014-03-26 MED ORDER — ENOXAPARIN SODIUM 30 MG/0.3ML ~~LOC~~ SOLN
30.0000 mg | Freq: Two times a day (BID) | SUBCUTANEOUS | Status: DC
  Administered 2014-03-27: 30 mg via SUBCUTANEOUS
  Filled 2014-03-26 (×3): qty 0.3

## 2014-03-26 MED ORDER — LABETALOL HCL 200 MG PO TABS
200.0000 mg | ORAL_TABLET | Freq: Two times a day (BID) | ORAL | Status: DC
  Administered 2014-03-26 – 2014-03-27 (×2): 200 mg via ORAL
  Filled 2014-03-26 (×3): qty 1

## 2014-03-26 MED ORDER — MIDAZOLAM HCL 2 MG/2ML IJ SOLN
INTRAMUSCULAR | Status: AC
  Filled 2014-03-26: qty 2

## 2014-03-26 MED ORDER — PROPOFOL 10 MG/ML IV BOLUS
INTRAVENOUS | Status: AC
  Filled 2014-03-26: qty 20

## 2014-03-26 MED ORDER — METOCLOPRAMIDE HCL 10 MG PO TABS: 5.0000 mg | ORAL_TABLET | Freq: Three times a day (TID) | ORAL | Status: DC | PRN

## 2014-03-26 MED ORDER — BACLOFEN 10 MG PO TABS: 10.0000 mg | ORAL_TABLET | Freq: Three times a day (TID) | ORAL | Status: DC

## 2014-03-26 MED ORDER — AMLODIPINE BESYLATE 10 MG PO TABS
10.0000 mg | ORAL_TABLET | Freq: Every day | ORAL | Status: DC
  Administered 2014-03-27: 10 mg via ORAL
  Filled 2014-03-26: qty 1

## 2014-03-26 MED ORDER — METHOCARBAMOL 500 MG PO TABS
500.0000 mg | ORAL_TABLET | Freq: Four times a day (QID) | ORAL | Status: DC | PRN
  Administered 2014-03-26: 500 mg via ORAL

## 2014-03-26 MED ORDER — ONDANSETRON HCL 4 MG PO TABS: 4.0000 mg | ORAL_TABLET | Freq: Three times a day (TID) | ORAL | Status: DC | PRN

## 2014-03-26 MED ORDER — METHOCARBAMOL 500 MG PO TABS
ORAL_TABLET | ORAL | Status: AC
  Administered 2014-03-26: 11:00:00
  Filled 2014-03-26: qty 1

## 2014-03-26 MED ORDER — MIDAZOLAM HCL 5 MG/5ML IJ SOLN
INTRAMUSCULAR | Status: DC | PRN
  Administered 2014-03-26 (×2): 1 mg via INTRAVENOUS

## 2014-03-26 MED ORDER — ASPIRIN EC 81 MG PO TBEC
81.0000 mg | DELAYED_RELEASE_TABLET | Freq: Every day | ORAL | Status: DC
  Administered 2014-03-26 – 2014-03-27 (×2): 81 mg via ORAL
  Filled 2014-03-26 (×2): qty 1

## 2014-03-26 MED ORDER — METOCLOPRAMIDE HCL 5 MG/ML IJ SOLN: 5.0000 mg | Freq: Three times a day (TID) | INTRAMUSCULAR | Status: DC | PRN

## 2014-03-26 MED ORDER — ONDANSETRON HCL 4 MG/2ML IJ SOLN: 4.0000 mg | Freq: Four times a day (QID) | INTRAMUSCULAR | Status: DC | PRN

## 2014-03-26 MED ORDER — LACTATED RINGERS IV SOLN
INTRAVENOUS | Status: DC | PRN
  Administered 2014-03-26 (×2): via INTRAVENOUS

## 2014-03-26 MED ORDER — ACETAMINOPHEN 325 MG PO TABS: 650.0000 mg | ORAL_TABLET | Freq: Four times a day (QID) | ORAL | Status: DC | PRN

## 2014-03-26 MED ORDER — 0.9 % SODIUM CHLORIDE (POUR BTL) OPTIME
TOPICAL | Status: DC | PRN
Start: 2014-03-26 — End: 2014-03-26
  Administered 2014-03-26: 1000 mL

## 2014-03-26 MED ORDER — PROPOFOL 10 MG/ML IV BOLUS
INTRAVENOUS | Status: DC | PRN
  Administered 2014-03-26: 30 mg via INTRAVENOUS
  Administered 2014-03-26: 150 mg via INTRAVENOUS

## 2014-03-26 MED ORDER — KETOROLAC TROMETHAMINE 15 MG/ML IJ SOLN
7.5000 mg | Freq: Four times a day (QID) | INTRAMUSCULAR | Status: AC
  Administered 2014-03-26 – 2014-03-27 (×4): 7.5 mg via INTRAVENOUS
  Filled 2014-03-26: qty 1

## 2014-03-26 MED ORDER — KETOROLAC TROMETHAMINE 30 MG/ML IJ SOLN
INTRAMUSCULAR | Status: AC
  Administered 2014-03-26: 15 mg
  Filled 2014-03-26: qty 1

## 2014-03-26 MED ORDER — BUPIVACAINE-EPINEPHRINE PF 0.5-1:200000 % IJ SOLN
INTRAMUSCULAR | Status: DC | PRN
  Administered 2014-03-26: 25 mL

## 2014-03-26 MED ORDER — EPHEDRINE SULFATE 50 MG/ML IJ SOLN
INTRAMUSCULAR | Status: AC
  Filled 2014-03-26: qty 1

## 2014-03-26 MED ORDER — POTASSIUM CHLORIDE IN NACL 20-0.45 MEQ/L-% IV SOLN
INTRAVENOUS | Status: DC
  Administered 2014-03-26: 75 mL/h via INTRAVENOUS
  Filled 2014-03-26 (×4): qty 1000

## 2014-03-26 MED ORDER — HYDROMORPHONE HCL PF 1 MG/ML IJ SOLN: 0.5000 mg | INTRAMUSCULAR | Status: DC | PRN

## 2014-03-26 MED ORDER — FENTANYL CITRATE 0.05 MG/ML IJ SOLN
INTRAMUSCULAR | Status: DC | PRN
  Administered 2014-03-26 (×3): 50 ug via INTRAVENOUS

## 2014-03-26 MED ORDER — GLYCOPYRROLATE 0.2 MG/ML IJ SOLN
INTRAMUSCULAR | Status: DC | PRN
  Administered 2014-03-26: .4 mg via INTRAVENOUS

## 2014-03-26 MED ORDER — NEOSTIGMINE METHYLSULFATE 1 MG/ML IJ SOLN
INTRAMUSCULAR | Status: DC | PRN
  Administered 2014-03-26: 3 mg via INTRAVENOUS

## 2014-03-26 MED ORDER — ALUM & MAG HYDROXIDE-SIMETH 200-200-20 MG/5ML PO SUSP: 30.0000 mL | ORAL | Status: DC | PRN

## 2014-03-26 MED ORDER — SENNOSIDES-DOCUSATE SODIUM 8.6-50 MG PO TABS
1.0000 | ORAL_TABLET | Freq: Every day | ORAL | Status: DC
  Administered 2014-03-27: 1 via ORAL
  Filled 2014-03-26: qty 1

## 2014-03-26 MED ORDER — DOCUSATE SODIUM 100 MG PO CAPS
100.0000 mg | ORAL_CAPSULE | Freq: Two times a day (BID) | ORAL | Status: DC
  Administered 2014-03-27: 100 mg via ORAL
  Filled 2014-03-26 (×3): qty 1

## 2014-03-26 MED ORDER — OXYCODONE HCL 5 MG PO TABS
ORAL_TABLET | ORAL | Status: AC
  Administered 2014-03-26: 11:00:00
  Filled 2014-03-26: qty 1

## 2014-03-26 MED ORDER — SODIUM CHLORIDE 0.9 % IR SOLN
Status: DC | PRN
  Administered 2014-03-26: 1000 mL

## 2014-03-26 MED ORDER — ENOXAPARIN SODIUM 30 MG/0.3ML ~~LOC~~ SOLN: 30.0000 mg | Freq: Two times a day (BID) | SUBCUTANEOUS | Status: DC

## 2014-03-26 MED ORDER — LIDOCAINE HCL (CARDIAC) 20 MG/ML IV SOLN
INTRAVENOUS | Status: DC | PRN
  Administered 2014-03-26: 80 mg via INTRAVENOUS

## 2014-03-26 MED ORDER — ATORVASTATIN CALCIUM 20 MG PO TABS
20.0000 mg | ORAL_TABLET | Freq: Every day | ORAL | Status: DC
  Administered 2014-03-26 – 2014-03-27 (×2): 20 mg via ORAL
  Filled 2014-03-26 (×2): qty 1

## 2014-03-26 MED ORDER — OXYCODONE-ACETAMINOPHEN 10-325 MG PO TABS: 1.0000 | ORAL_TABLET | Freq: Four times a day (QID) | ORAL | Status: DC | PRN

## 2014-03-26 MED ORDER — ONDANSETRON HCL 4 MG/2ML IJ SOLN
INTRAMUSCULAR | Status: AC
  Filled 2014-03-26: qty 2

## 2014-03-26 MED ORDER — OXYCODONE HCL 5 MG PO TABS
5.0000 mg | ORAL_TABLET | Freq: Once | ORAL | Status: AC | PRN
  Administered 2014-03-26: 5 mg via ORAL

## 2014-03-26 MED ORDER — OXYCODONE HCL 5 MG PO TABS
5.0000 mg | ORAL_TABLET | ORAL | Status: DC | PRN
  Administered 2014-03-27 (×2): 10 mg via ORAL
  Filled 2014-03-26 (×2): qty 2

## 2014-03-26 MED ORDER — POLYETHYLENE GLYCOL 3350 17 G PO PACK
17.0000 g | PACK | Freq: Every day | ORAL | Status: DC | PRN
Start: 2014-03-26 — End: 2014-03-27

## 2014-03-26 MED ORDER — HYDROMORPHONE HCL PF 1 MG/ML IJ SOLN
INTRAMUSCULAR | Status: AC
  Filled 2014-03-26: qty 1

## 2014-03-26 MED ORDER — BISACODYL 10 MG RE SUPP: 10.0000 mg | Freq: Every day | RECTAL | Status: DC | PRN

## 2014-03-26 MED ORDER — ROCURONIUM BROMIDE 100 MG/10ML IV SOLN
INTRAVENOUS | Status: DC | PRN
  Administered 2014-03-26: 40 mg via INTRAVENOUS

## 2014-03-26 MED ORDER — MAGNESIUM CITRATE PO SOLN: 1.0000 | Freq: Once | ORAL | Status: AC | PRN

## 2014-03-26 MED ORDER — MENTHOL 3 MG MT LOZG
1.0000 | LOZENGE | OROMUCOSAL | Status: DC | PRN
Start: 2014-03-26 — End: 2014-03-27

## 2014-03-26 MED ORDER — TAMSULOSIN HCL 0.4 MG PO CAPS
0.4000 mg | ORAL_CAPSULE | Freq: Every day | ORAL | Status: DC
  Administered 2014-03-27: 0.4 mg via ORAL
  Filled 2014-03-26: qty 1

## 2014-03-26 MED ORDER — PHENOL 1.4 % MT LIQD
1.0000 | OROMUCOSAL | Status: DC | PRN
Start: 2014-03-26 — End: 2014-03-27

## 2014-03-26 MED ORDER — PHENYLEPHRINE 40 MCG/ML (10ML) SYRINGE FOR IV PUSH (FOR BLOOD PRESSURE SUPPORT)
PREFILLED_SYRINGE | INTRAVENOUS | Status: AC
  Filled 2014-03-26: qty 10

## 2014-03-26 MED ORDER — SENNA 8.6 MG PO TABS
1.0000 | ORAL_TABLET | Freq: Two times a day (BID) | ORAL | Status: DC
  Administered 2014-03-27: 8.6 mg via ORAL
  Filled 2014-03-26 (×3): qty 1

## 2014-03-26 MED ORDER — POLYETHYLENE GLYCOL 3350 17 G PO PACK
17.0000 g | PACK | Freq: Two times a day (BID) | ORAL | Status: DC
  Administered 2014-03-27: 17 g via ORAL
  Filled 2014-03-26 (×4): qty 1

## 2014-03-26 MED ORDER — MIDAZOLAM HCL 2 MG/2ML IJ SOLN: 1.0000 mg | INTRAMUSCULAR | Status: DC | PRN

## 2014-03-26 MED ORDER — NITROGLYCERIN 0.4 MG SL SUBL: 0.4000 mg | SUBLINGUAL_TABLET | SUBLINGUAL | Status: DC | PRN

## 2014-03-26 MED ORDER — OXYCODONE HCL 5 MG/5ML PO SOLN: 5.0000 mg | Freq: Once | ORAL | Status: AC | PRN

## 2014-03-26 MED ORDER — HYDROMORPHONE HCL PF 1 MG/ML IJ SOLN: 0.2500 mg | INTRAMUSCULAR | Status: DC | PRN

## 2014-03-26 MED ORDER — NEOSTIGMINE METHYLSULFATE 1 MG/ML IJ SOLN
INTRAMUSCULAR | Status: AC
  Filled 2014-03-26: qty 10

## 2014-03-26 SURGICAL SUPPLY — 63 items
BANDAGE ELASTIC 6 VELCRO ST LF (GAUZE/BANDAGES/DRESSINGS) ×4 IMPLANT
BANDAGE ESMARK 6X9 LF (GAUZE/BANDAGES/DRESSINGS) ×2 IMPLANT
BENZOIN TINCTURE PRP APPL 2/3 (GAUZE/BANDAGES/DRESSINGS) ×4 IMPLANT
BLADE SAG 18X100X1.27 (BLADE) ×4 IMPLANT
BLADE SAW RECIP 87.9 MT (BLADE) ×4 IMPLANT
BLADE SAW SGTL 13X75X1.27 (BLADE) ×4 IMPLANT
BNDG ESMARK 6X9 LF (GAUZE/BANDAGES/DRESSINGS) ×4
BOOTCOVER CLEANROOM LRG (PROTECTIVE WEAR) ×8 IMPLANT
BOWL SMART MIX CTS (DISPOSABLE) ×4 IMPLANT
CAPT RP KNEE ×4 IMPLANT
CEMENT HV SMART SET (Cement) ×8 IMPLANT
CLOSURE STERI-STRIP 1/2X4 (GAUZE/BANDAGES/DRESSINGS) ×1
CLSR STERI-STRIP ANTIMIC 1/2X4 (GAUZE/BANDAGES/DRESSINGS) ×3 IMPLANT
COVER SURGICAL LIGHT HANDLE (MISCELLANEOUS) ×4 IMPLANT
CUFF TOURNIQUET SINGLE 34IN LL (TOURNIQUET CUFF) ×4 IMPLANT
DRAPE EXTREMITY T 121X128X90 (DRAPE) ×4 IMPLANT
DRAPE PROXIMA HALF (DRAPES) ×4 IMPLANT
DRAPE U-SHAPE 47X51 STRL (DRAPES) ×4 IMPLANT
DRSG PAD ABDOMINAL 8X10 ST (GAUZE/BANDAGES/DRESSINGS) ×4 IMPLANT
DURAPREP 26ML APPLICATOR (WOUND CARE) ×8 IMPLANT
ELECT CAUTERY BLADE 6.4 (BLADE) ×4 IMPLANT
ELECT REM PT RETURN 9FT ADLT (ELECTROSURGICAL) ×4
ELECTRODE REM PT RTRN 9FT ADLT (ELECTROSURGICAL) ×2 IMPLANT
FACESHIELD WRAPAROUND (MASK) ×8 IMPLANT
GLOVE BIOGEL PI IND STRL 6.5 (GLOVE) ×2 IMPLANT
GLOVE BIOGEL PI INDICATOR 6.5 (GLOVE) ×2
GLOVE BIOGEL PI ORTHO PRO SZ8 (GLOVE) ×4
GLOVE ORTHO TXT STRL SZ7.5 (GLOVE) ×8 IMPLANT
GLOVE PI ORTHO PRO STRL SZ8 (GLOVE) ×4 IMPLANT
GLOVE SURG ORTHO 8.0 STRL STRW (GLOVE) ×4 IMPLANT
GLOVE SURG SS PI 6.5 STRL IVOR (GLOVE) ×4 IMPLANT
GOWN STRL REUS W/ TWL XL LVL3 (GOWN DISPOSABLE) ×4 IMPLANT
GOWN STRL REUS W/TWL 2XL LVL3 (GOWN DISPOSABLE) ×16 IMPLANT
GOWN STRL REUS W/TWL XL LVL3 (GOWN DISPOSABLE) ×4
HANDPIECE INTERPULSE COAX TIP (DISPOSABLE) ×2
HOOD PEEL AWAY FACE SHEILD DIS (HOOD) ×8 IMPLANT
IMMOBILIZER KNEE 22 (SOFTGOODS) ×4 IMPLANT
IMMOBILIZER KNEE 22 UNIV (SOFTGOODS) ×4 IMPLANT
KIT BASIN OR (CUSTOM PROCEDURE TRAY) ×4 IMPLANT
KIT ROOM TURNOVER OR (KITS) ×4 IMPLANT
MANIFOLD NEPTUNE II (INSTRUMENTS) ×4 IMPLANT
NS IRRIG 1000ML POUR BTL (IV SOLUTION) ×4 IMPLANT
PACK TOTAL JOINT (CUSTOM PROCEDURE TRAY) ×4 IMPLANT
PAD ABD 8X10 STRL (GAUZE/BANDAGES/DRESSINGS) ×4 IMPLANT
PAD ARMBOARD 7.5X6 YLW CONV (MISCELLANEOUS) ×8 IMPLANT
PAD CAST 4YDX4 CTTN HI CHSV (CAST SUPPLIES) ×2 IMPLANT
PADDING CAST COTTON 4X4 STRL (CAST SUPPLIES) ×2
PADDING CAST COTTON 6X4 STRL (CAST SUPPLIES) ×4 IMPLANT
SET HNDPC FAN SPRY TIP SCT (DISPOSABLE) ×2 IMPLANT
SPONGE GAUZE 4X4 12PLY (GAUZE/BANDAGES/DRESSINGS) ×4 IMPLANT
STAPLER VISISTAT 35W (STAPLE) ×4 IMPLANT
SUCTION FRAZIER TIP 10 FR DISP (SUCTIONS) ×4 IMPLANT
SUT MNCRL AB 4-0 PS2 18 (SUTURE) ×4 IMPLANT
SUT VIC AB 0 CT1 27 (SUTURE) ×4
SUT VIC AB 0 CT1 27XBRD ANBCTR (SUTURE) ×4 IMPLANT
SUT VIC AB 2-0 CT1 27 (SUTURE) ×2
SUT VIC AB 2-0 CT1 TAPERPNT 27 (SUTURE) ×2 IMPLANT
SUT VIC AB 3-0 SH 8-18 (SUTURE) ×4 IMPLANT
SYR 30ML LL (SYRINGE) ×4 IMPLANT
TOWEL OR 17X24 6PK STRL BLUE (TOWEL DISPOSABLE) ×4 IMPLANT
TOWEL OR 17X26 10 PK STRL BLUE (TOWEL DISPOSABLE) ×4 IMPLANT
TRAY FOLEY CATH 16FRSI W/METER (SET/KITS/TRAYS/PACK) IMPLANT
WATER STERILE IRR 1000ML POUR (IV SOLUTION) IMPLANT

## 2014-03-26 NOTE — Discharge Instructions (Signed)
Diet: As you were doing prior to hospitalization  ° °Shower:  May shower but keep the wounds dry, use an occlusive plastic wrap, NO SOAKING IN TUB.  If the bandage gets wet, change with a clean dry gauze. ° °Dressing:  You may change your dressing 3-5 days after surgery.  Then change the dressing daily with sterile gauze dressing.   ° °There are sticky tapes (steri-strips) on your wounds and all the stitches are absorbable.  Leave the steri-strips in place when changing your dressings, they will peel off with time, usually 2-3 weeks. ° °Activity:  Increase activity slowly as tolerated, but follow the weight bearing instructions below.  No lifting or driving for 6 weeks. ° °Weight Bearing:   As tolerated.   ° °To prevent constipation: you may use a stool softener such as - ° °Colace (over the counter) 100 mg by mouth twice a day  °Drink plenty of fluids (prune juice may be helpful) and high fiber foods °Miralax (over the counter) for constipation as needed.   ° °Itching:  If you experience itching with your medications, try taking only a single pain pill, or even half a pain pill at a time.  You may take up to 10 pain pills per day, and you can also use benadryl over the counter for itching or also to help with sleep.  ° °Precautions:  If you experience chest pain or shortness of breath - call 911 immediately for transfer to the hospital emergency department!! ° °If you develop a fever greater that 101 F, purulent drainage from wound, increased redness or drainage from wound, or calf pain -- Call the office at [phone]                                                °Follow- Up Appointment:  Please call for an appointment to be seen in 2 weeks [facility] - [phone] ° ° ° ° ° °

## 2014-03-26 NOTE — Transfer of Care (Signed)
Immediate Anesthesia Transfer of Care Note  Patient: Tanner Griffin  Procedure(s) Performed: Procedure(s): TOTAL KNEE ARTHROPLASTY (Left) CLOSED MANIPULATION KNEE (Right)  Patient Location: PACU  Anesthesia Type:General and Regional  Level of Consciousness: awake and alert   Airway & Oxygen Therapy: Patient Spontanous Breathing and Patient connected to nasal cannula oxygen  Post-op Assessment: Report given to PACU RN, Post -op Vital signs reviewed and stable and Patient moving all extremities X 4  Post vital signs: Reviewed and stable  Complications: No apparent anesthesia complications

## 2014-03-26 NOTE — Op Note (Signed)
DATE OF SURGERY:  03/26/2014 TIME: 9:46 AM  PATIENT NAME:  Tanner Griffin   AGE: 73 y.o.    PRE-OPERATIVE DIAGNOSIS:  LT KNEE DJD, RT KNEE arthrofibrosis  POST-OPERATIVE DIAGNOSIS:  Same  PROCEDURE:  Procedure(s): Left TOTAL KNEE ARTHROPLASTY CLOSED MANIPULATION RIGHT KNEE   SURGEON:  [name], MD   ASSISTANT:  Chriss Czar., PA-C, present and scrubbed throughout the case, critical for assistance with exposure, retraction, instrumentation, and closure.   OPERATIVE IMPLANTS: Depuy PFC Sigma, Posterior Stabilized.  Femur size 5, Tibia size 4, Patella size 38 3-peg oval button, with a 10 mm polyethylene insert.   PREOPERATIVE INDICATIONS:  Tanner Griffin is a 73 y.o. year old male with end stage bone on bone degenerative arthritis of the knee who failed conservative treatment, including injections, antiinflammatories, activity modification, and assistive devices, and had significant impairment of their activities of daily living, and elected for Total Knee Arthroplasty. He had a valgus knee. He had a right total knee replacement done 2 years ago, and was satisfied with the results of that, although frustrated because he was never able to regain more than 85 of flexion. He wished to have a manipulation of the right knee, as well as a replacement left knee.  The risks, benefits, and alternatives were discussed at length including but not limited to the risks of infection, bleeding, nerve injury, stiffness, blood clots, the need for revision surgery, cardiopulmonary complications, among others, and they were willing to proceed. We also discussed the risks that he may not be able to regain significant motion in either knee.   OPERATIVE DESCRIPTION:  The patient was brought to the operative room and placed in a supine position.  General anesthesia was administered.  IV antibiotics were given.  The right lower extremity was manipulated after time out was performed, and I was able  to get him to about 95 of flexion, versus 85 preoperatively. This is a with a fairly forceful manipulation, and he could not bend any further, and in fact he also did not bend any further on the left side. His preoperative range of motion was from 5 to 85 at the most on the left side.   The left lower extremity was prepped and draped in the usual sterile fashion.  Time out was performed.  The leg was elevated and exsanguinated and the tourniquet was inflated.  Anterior quadriceps tendon splitting approach was performed.  The patella was everted and osteophytes were removed.  The anterior horn of the medial and lateral meniscus was removed.   The distal femur was opened with the drill and the intramedullary distal femoral cutting jig was utilized, set at 5 degrees resecting 10 mm off the distal femur.  Care was taken to protect the collateral ligaments. The lateral femoral condyle was somewhat hypoplastic, and I did not take very much off of the distal femur on the lateral side.  Then the extramedullary tibial cutting jig was utilized making the appropriate cut using the anterior tibial crest as a reference building in appropriate posterior slope.  Care was taken during the cut to protect the medial and collateral ligaments.  The proximal tibia was removed along with the posterior horns of the menisci.  The PCL was sacrificed.  I placed the jig in neutral alignment, parallel with the tibial crest.  The extensor gap was measured and was approximately 36mm.    The distal femoral sizing jig was applied, taking care to avoid notching.  Then the 4-in-1  cutting jig was applied and the anterior and posterior femur was cut, along with the chamfer cuts.  All posterior osteophytes were removed.  The flexion gap was then measured and was symmetric with the extension gap.  I completed the distal femoral preparation using the appropriate jig to prepare the box.  The patella was then measured, and cut with the  saw.  Before the cut it measured 26, afterwards measured 16. The patella could have accommodated a size 41, however that would significantly increase the thickness, and his flexion was already fairly tight, and additionally going down one size allowed me to medialize the button in order to optimize tracking given his valgus alignment.  The proximal tibia sized and prepared accordingly with the reamer and the punch, and then all components were trialed with the 74mm poly insert.  The knee was found to have excellent balance and no thumb tracking technique with 0-95ofmotion..  This knee was just as stiff as the other knee.  The above named components were then cemented into place and all excess cement was removed.  The real polyethylene implant was placed.  The knee was easily taken through a range of motion as indicated above. and the patella tracked well and the knee irrigated copiously and the parapatellar and subcutaneous tissue closed with vicryl, and monocryl with steri strips for the skin.  The wounds were injected with marcaine, and dressed with sterile gauze and the tourniquet released and the patient was awakened and returned to the PACU in stable and satisfactory condition.  There were no complications.  Total tourniquet time was ~120 minutes.

## 2014-03-26 NOTE — Evaluation (Signed)
Physical Therapy Evaluation Patient Details Name: Tanner Griffin MRN: [id number] DOB: September 10, 1941 Today's Date: 03/26/2014   History of Present Illness  Pt is a 73 y/o male admitted s/p elective L TKA and closed manipulation of R knee.  Clinical Impression  Pt is s/p TKA resulting in the deficits listed below (see PT Problem List). At the time of PT eval pt was able to perform transfers and ambulation with min-mod assist. Pt will benefit from skilled PT to increase their independence and safety with mobility to allow discharge to the venue listed below.      Follow Up Recommendations Home health PT    Equipment Recommendations  None recommended by PT    Recommendations for Other Services       Precautions / Restrictions Precautions Precautions: Fall;Knee Precaution Comments: Discussed towel roll under heel and NO pillow under knee.  Required Braces or Orthoses: Knee Immobilizer - Left Knee Immobilizer - Left: Other (comment) (No order for it but was in room and donned for session. ) Restrictions Weight Bearing Restrictions: Yes RLE Weight Bearing: Weight bearing as tolerated LLE Weight Bearing: Weight bearing as tolerated      Mobility  Bed Mobility Overal bed mobility: Needs Assistance Bed Mobility: Supine to Sit     Supine to sit: Min assist     General bed mobility comments: VC's for sequencing and technique. Assist for movement and support of the LLE as pt transitions to EOB.   Transfers Overall transfer level: Needs assistance Equipment used: Rolling walker (2 wheeled) Transfers: Sit to/from Stand Sit to Stand: Mod assist;From elevated surface         General transfer comment: VC's for hand placement on seated surface for safety. Pt with difficulty flexing R knee enough to adaquetly push up from. Unable to power-up tp full stand with bed in lowest position, however was able to achieve full standing with elevated bed.   Ambulation/Gait Ambulation/Gait  assistance: Min guard Ambulation Distance (Feet): 10 Feet Assistive device: Rolling walker (2 wheeled) Gait Pattern/deviations: Step-to pattern;Decreased stride length;Trunk flexed Gait velocity: Decreased Gait velocity interpretation: Below normal speed for age/gender General Gait Details: Pt able to ambulate into bathroom to void, and ambulate back to recliner. VC's for sequencing and safety awareness with the RW.   Stairs            Wheelchair Mobility    Modified Rankin (Stroke Patients Only)       Balance Overall balance assessment: Needs assistance Sitting-balance support: Feet supported Sitting balance-Leahy Scale: Fair     Standing balance support: Bilateral upper extremity supported Standing balance-Leahy Scale: Poor                               Pertinent Vitals/Pain Pt does not complain of pain throughout session, however when asked he reports 8/10 pain. RN notified at end of session.     Home Living Family/patient expects to be discharged to:: Private residence Living Arrangements: Spouse/significant other Available Help at Discharge: Family;Available 24 hours/day Type of Home: House Home Access: Stairs to enter Entrance Stairs-Rails: None Entrance Stairs-Number of Steps: 1 Home Layout: One level Home Equipment: Walker - 2 wheels;Bedside commode;Shower seat;Crutches      Prior Function Level of Independence: Needs assistance      ADL's / Homemaking Assistance Needed: Occasionally needed assist tying shoes        Hand Dominance   Dominant Hand: Right  Extremity/Trunk Assessment   Upper Extremity Assessment: Defer to OT evaluation           Lower Extremity Assessment: LLE deficits/detail;RLE deficits/detail RLE Deficits / Details: Decreased knee flexion from previous surgery.  LLE Deficits / Details: Decreased strength and AROM consistent with TKA  Cervical / Trunk Assessment: Normal  Communication   Communication: No  difficulties  Cognition Arousal/Alertness: Lethargic;Suspect due to medications Behavior During Therapy: [facility] for tasks assessed/performed Overall Cognitive Status: Within Functional Limits for tasks assessed                      General Comments      Exercises Total Joint Exercises Ankle Circles/Pumps: 10 reps      Assessment/Plan    PT Assessment Patient needs continued PT services  PT Diagnosis Difficulty walking;Acute pain   PT Problem List Decreased strength;Decreased range of motion;Decreased activity tolerance;Decreased balance;Decreased mobility;Decreased knowledge of use of DME;Decreased knowledge of precautions;Decreased safety awareness;Pain  PT Treatment Interventions DME instruction;Gait training;Stair training;Functional mobility training;Therapeutic activities;Therapeutic exercise;Neuromuscular re-education;Patient/family education   PT Goals (Current goals can be found in the Care Plan section) Acute Rehab PT Goals Patient Stated Goal: To return home PT Goal Formulation: With patient/family Time For Goal Achievement: 04/02/14 Potential to Achieve Goals: Good    Frequency 7X/week   Barriers to discharge        Co-evaluation               End of Session Equipment Utilized During Treatment: Gait belt;Left knee immobilizer Activity Tolerance: Patient tolerated treatment well Patient left: in chair;with call bell/phone within reach;with family/visitor present Nurse Communication: Mobility status         Time: 1500-1530 PT Time Calculation (min): 30 min   Charges:   PT Evaluation $Initial PT Evaluation Tier I: 1 Procedure PT Treatments $Therapeutic Activity: 23-37 mins   PT G CodesJolyn Lent 03/26/2014, 4:39 PM  Jolyn Lent, PT, DPT Acute Rehabilitation Services Pager: [phone]

## 2014-03-26 NOTE — Progress Notes (Signed)
Orthopedic Tech Progress Note Patient Details:  Tanner Griffin 12/11/1941 [id number] CPM applied to Left LE with appropriate settings. OHF applied to bed.  CPM Left Knee CPM Left Knee: On Left Knee Flexion (Degrees): 60 Left Knee Extension (Degrees): 0   [name] 03/26/2014, 11:40 AM

## 2014-03-26 NOTE — Progress Notes (Signed)
Utilization review completed.  

## 2014-03-26 NOTE — Anesthesia Procedure Notes (Addendum)
Anesthesia Regional Block:  Femoral nerve block  Pre-Anesthetic Checklist: ,, timeout performed, Correct Patient, Correct Site, Correct Laterality, Correct Procedure, Correct Position, site marked, Risks and benefits discussed,  Surgical consent,  Pre-op evaluation,  At surgeon's request and post-op pain management  Laterality: Left  Prep: chloraprep       Needles:  Injection technique: Single-shot  Needle Type: Echogenic Stimulator Needle     Needle Length: 9cm 9 cm Needle Gauge: 22 and 22 G    Additional Needles:  Procedures: nerve stimulator Femoral nerve block  Nerve Stimulator or Paresthesia:  Response: 0.48 mA,   Additional Responses:   Narrative:  Start time: 03/26/2014 7:04 AM End time: 03/26/2014 7:13 AM Injection made incrementally with aspirations every 5 mL. Anesthesiologist: Dr Chriss Driver  Additional Notes: 8366-2947 L Fem Nerve Block POP CHG prep, sterile tech #22 stim/echo needle with stim down to .71ma Multiple neg asp Marc .5% w/epi 1:200000 total 25cc+decadron 4mg  infiltrated No compl Dr Chriss Driver   Performed by: Blair Heys E    Procedure Name: Intubation Date/Time: 03/26/2014 7:41 AM Performed by: Blair Heys E Pre-anesthesia Checklist: Patient identified, Emergency Drugs available, Suction available and Patient being monitored Patient Re-evaluated:Patient Re-evaluated prior to inductionOxygen Delivery Method: Circle system utilized Preoxygenation: Pre-oxygenation with 100% oxygen Intubation Type: IV induction Ventilation: Mask ventilation without difficulty and Oral airway inserted - appropriate to patient size Laryngoscope Size: Mac and 3 Grade View: Grade I Tube type: Oral Tube size: 7.5 mm Number of attempts: 1 Airway Equipment and Method: Stylet and Oral airway Placement Confirmation: ETT inserted through vocal cords under direct vision,  positive ETCO2,  CO2 detector and breath sounds checked- equal and bilateral Secured at: 22 cm Tube  secured with: Tape Dental Injury: Teeth and Oropharynx as per pre-operative assessment

## 2014-03-26 NOTE — Anesthesia Postprocedure Evaluation (Signed)
  Anesthesia Post-op Note  Patient: [name]  Procedure(s) Performed: Procedure(s): TOTAL KNEE ARTHROPLASTY (Left) CLOSED MANIPULATION KNEE (Right)  Patient Location: PACU  Anesthesia Type:GA combined with regional for post-op pain  Level of Consciousness: awake and alert   Airway and Oxygen Therapy: Patient Spontanous Breathing  Post-op Pain: mild  Post-op Assessment: Post-op Vital signs reviewed, Patient's Cardiovascular Status Stable, Respiratory Function Stable, Patent Airway, No signs of Nausea or vomiting and Pain level controlled  Post-op Vital Signs: Reviewed and stable  Complications: No apparent anesthesia complications

## 2014-03-26 NOTE — Progress Notes (Signed)
Report given to [facility] shaver rn as cargiver

## 2014-03-26 NOTE — Anesthesia Preprocedure Evaluation (Addendum)
Anesthesia Evaluation  Patient identified by MRN, date of birth, ID band Patient awake    Reviewed: Allergy & Precautions, H&P , NPO status , Patient's Chart, lab work & pertinent test results  Airway Mallampati: II TM Distance: >3 FB Neck ROM: Full    Dental  (+) Dental Advisory Given, Missing, Poor Dentition   Pulmonary  breath sounds clear to auscultation        Cardiovascular hypertension, Pt. on medications and Pt. on home beta blockers Rhythm:Regular Rate:Normal     Neuro/Psych    GI/Hepatic   Endo/Other  diabetes, Type 2  Renal/GU      Musculoskeletal  (+) Arthritis -,   Abdominal   Peds  Hematology   Anesthesia Other Findings   Reproductive/Obstetrics                       Anesthesia Physical Anesthesia Plan  ASA: III  Anesthesia Plan: General   Post-op Pain Management:    Induction: Intravenous  Airway Management Planned: Oral ETT  Additional Equipment:   Intra-op Plan:   Post-operative Plan: Extubation in OR  Informed Consent: I have reviewed the patients History and Physical, chart, labs and discussed the procedure including the risks, benefits and alternatives for the proposed anesthesia with the patient or authorized representative who has indicated his/her understanding and acceptance.   Dental advisory given  Plan Discussed with: CRNA and Surgeon  Anesthesia Plan Comments:        Anesthesia Quick Evaluation

## 2014-03-26 NOTE — H&P (Signed)
PREOPERATIVE H&P  Chief Complaint: LT KNEE DJD, RT KNEE LYSIS OF ADHESIONS  HPI: Tanner Griffin is a 73 y.o. male who presents for preoperative history and physical with a diagnosis of LT KNEE DJD, RT KNEE ADHESION. Symptoms are rated as moderate to severe, and have been worsening.  This is significantly impairing activities of daily living.  He has elected for surgical management. He has failed injections, cant take nsaids,   Elected TKA, but also complained of contralateral stiffness.                 Past Medical History  Diagnosis Date  . HYPERLIPIDEMIA     takes Lipitor daily  . dm 2     pt denies dm  . Diabetes mellitus     not taking any med  . HYPERTENSION     takes Amlodipine and Labetalol daily  . Constipation     takes Miralax daily as needed  . Pneumonia     hx of-as a child  . Arthritis   . Joint pain   . Joint swelling   . Urinary frequency     takes Flomax daily  . Nocturia    Past Surgical History  Procedure Laterality Date  . Total knee arthroplasty  2011    right  . Rt knee total replacement    . Colonoscopy     History   Social History  . Marital Status: Married    Spouse Name: N/A    Number of Children: N/A  . Years of Education: N/A   Social History Main Topics  . Smoking status: Never Smoker   . Smokeless tobacco: Never Used  . Alcohol Use: No  . Drug Use: No  . Sexual Activity: Yes   Other Topics Concern  . None   Social History Narrative  . None   Family History  Problem Relation Age of Onset  . Colon cancer Neg Hx   . Stomach cancer Neg Hx   . Esophageal cancer Neg Hx   . Rectal cancer Neg Hx    Allergies  Allergen Reactions  . Telmisartan Other (See Comments)    REACTION: headache   Prior to Admission medications   Medication Sig Start Date End Date Taking? Authorizing Provider  amLODipine (NORVASC) 10 MG tablet Take 10 mg by mouth daily.   Yes Historical Provider, MD  aspirin EC 81 MG tablet Take 81 mg by mouth daily.    Yes Historical Provider, MD  atorvastatin (LIPITOR) 20 MG tablet Take 1 tablet (20 mg total) by mouth daily. 12/03/13  Yes Bruce Kendall Flack, MD  B Complex-Biotin-FA (B-COMPLEX PO) Take 1 tablet by mouth daily.   Yes Historical Provider, MD  labetalol (NORMODYNE) 200 MG tablet Take 200 mg by mouth 2 (two) times daily.   Yes Historical Provider, MD  meloxicam (MOBIC) 15 MG tablet Take 15 mg by mouth daily.   Yes Historical Provider, MD  polyethylene glycol (MIRALAX / GLYCOLAX) packet Take 17 g by mouth 2 (two) times daily.   Yes Historical Provider, MD  senna-docusate (SENOKOT-S) 8.6-50 MG per tablet Take 1 tablet by mouth daily.   Yes Historical Provider, MD  tamsulosin (FLOMAX) 0.4 MG CAPS Take 1 capsule (0.4 mg total) by mouth daily. 06/18/13  Yes Lisabeth Pick, MD  nitroGLYCERIN (NITROSTAT) 0.4 MG SL tablet Place 1 tablet (0.4 mg total) under the tongue every 5 (five) minutes as needed for chest pain. 04/06/12 04/16/14  Lisabeth Pick, MD  Positive ROS: All other systems have been reviewed and were otherwise negative with the exception of those mentioned in the HPI and as above.  Physical Exam: General: Alert, no acute distress Cardiovascular: No pedal edema Respiratory: No cyanosis, no use of accessory musculature GI: No organomegaly, abdomen is soft and non-tender Skin: No lesions in the area of chief complaint Neurologic: Sensation intact distally Psychiatric: Patient is competent for consent with normal mood and affect Lymphatic: No axillary or cervical lymphadenopathy  MUSCULOSKELETAL: Right knee has range of motion from 3 to 85. Left knee has flexion contracture of 10 to 85.  Assessment: LT KNEE DJD, RT KNEE ARTHROFIBROSIS  Plan: Plan for Procedure(s): Left TOTAL KNEE ARTHROPLASTY CLOSED MANIPULATION RIGHT KNEE  The risks benefits and alternatives were discussed with the patient including but not limited to the risks of nonoperative treatment, versus surgical intervention  including infection, bleeding, nerve injury,  blood clots, cardiopulmonary complications, morbidity, mortality, among others, and they were willing to proceed. We've also discussed the risks for persistent stiffness, incomplete relief of pain, DVT, among others.  [name], MD Cell [phone]   03/26/2014 7:16 AM

## 2014-03-27 LAB — BASIC METABOLIC PANEL
BUN: 22 mg/dL (ref 6–23)
CHLORIDE: 107 meq/L (ref 96–112)
CO2: 23 meq/L (ref 19–32)
Calcium: 9.2 mg/dL (ref 8.4–10.5)
Creatinine, Ser: 0.84 mg/dL (ref 0.50–1.35)
GFR, EST NON AFRICAN AMERICAN: 87 mL/min — AB (ref >90)
Glucose, Bld: 194 mg/dL — ABNORMAL HIGH (ref 70–99)
Potassium: 4.3 mEq/L (ref 3.7–5.3)
SODIUM: 143 meq/L (ref 137–147)

## 2014-03-27 LAB — CBC
HCT: 34.8 % — ABNORMAL LOW (ref 39.0–52.0)
Hemoglobin: 11.6 g/dL — ABNORMAL LOW (ref 13.0–17.0)
MCH: 31.8 pg (ref 26.0–34.0)
MCHC: 33.3 g/dL (ref 30.0–36.0)
MCV: 95.3 fL (ref 78.0–100.0)
Platelets: 229 10*3/uL (ref 150–400)
RBC: 3.65 MIL/uL — AB (ref 4.22–5.81)
RDW: 12.9 % (ref 11.5–15.5)
WBC: 14.6 10*3/uL — AB (ref 4.0–10.5)

## 2014-03-27 LAB — GLUCOSE, CAPILLARY
Glucose-Capillary: 193 mg/dL — ABNORMAL HIGH (ref 70–99)
Glucose-Capillary: 197 mg/dL — ABNORMAL HIGH (ref 70–99)
Glucose-Capillary: 218 mg/dL — ABNORMAL HIGH (ref 70–99)

## 2014-03-27 MED ORDER — KETOROLAC TROMETHAMINE 15 MG/ML IJ SOLN
INTRAMUSCULAR | Status: AC
  Administered 2014-03-27: 01:00:00
  Filled 2014-03-27: qty 1

## 2014-03-27 NOTE — Progress Notes (Signed)
Patient ID: Tanner Griffin, male   DOB: 1941/06/11, 73 y.o.   MRN: [id number]     Subjective:  Patient reports pain as mild.  Patient requesting to go home today if possible.   Objective:   VITALS:   Filed Vitals:   03/26/14 1950 03/26/14 2112 03/27/14 0020 03/27/14 0400  BP:  147/83 148/67   Pulse:  91 61   Temp:  98.3 F (36.8 C) 98.2 F (36.8 C)   TempSrc:  Oral Oral   Resp: 18 18 18 18   Weight:      SpO2: 95% 96% 97% 97%    ABD soft Sensation intact distally Dorsiflexion/Plantar flexion intact Incision: dressing C/D/I and no drainage Hip, ankle,and foot function normal Patient in the CPM   Lab Results  Component Value Date   WBC 12.2* 03/26/2014   HGB 12.3* 03/26/2014   HCT 36.9* 03/26/2014   MCV 95.8 03/26/2014   PLT 242 03/26/2014     Assessment/Plan: 1 Day Post-Op   Principal Problem:   Osteoarthritis of left knee Active Problems:   Arthrofibrosis of total knee arthroplasty, right   Knee osteoarthritis   Advance diet Up with therapy WBAT Dry dressing PRN Plan to DC home if passes PT   Joya Gaskins 03/27/2014, 7:59 AM  Seen and agreee.  Marchia Bond, MD Cell [phone]

## 2014-03-27 NOTE — Progress Notes (Signed)
Physical Therapy Treatment Patient Details Name: Tanner Griffin MRN: [id number] DOB: 1941-12-15 Today's Date: 03/27/2014    History of Present Illness Pt is a 73 y/o male admitted s/p elective L TKA and closed manipulation of R knee.    PT Comments    Pt progressing towards physical therapy goals. Was able to negotiate the curb step well and practiced many times throughout session. Pt reports no questions with HEP or precautions, and pt/wife anticipate pt will d/c this afternoon. Will continue to follow until d/c.   Follow Up Recommendations  Home health PT     Equipment Recommendations  None recommended by PT    Recommendations for Other Services       Precautions / Restrictions Precautions Precautions: Fall;Knee Precaution Comments: Discussed towel roll under heel and NO pillow under knee.  Restrictions Weight Bearing Restrictions: Yes RLE Weight Bearing: Weight bearing as tolerated LLE Weight Bearing: Weight bearing as tolerated    Mobility  Bed Mobility Overal bed mobility: Needs Assistance Bed Mobility: Supine to Sit     Supine to sit: Min guard     General bed mobility comments: VC's for sequencing and technique. Was able to transition to EOB without assist for LLE. Upon full sit, pt required assist to maintain balance at EOB initially, but then was independent.   Transfers Overall transfer level: Needs assistance Equipment used: Rolling walker (2 wheeled) Transfers: Sit to/from Stand Sit to Stand: Min assist         General transfer comment: VC's for hand placement on seated surface for safety.   Ambulation/Gait Ambulation/Gait assistance: Min guard Ambulation Distance (Feet): 125 Feet Assistive device: Rolling walker (2 wheeled) Gait Pattern/deviations: Step-through pattern;Decreased stride length;Trunk flexed Gait velocity: Decreased Gait velocity interpretation: Below normal speed for age/gender General Gait Details: VC's for sequencing, increased  heel strike, improved posture, and increased toe off during gait training.    Stairs Stairs: Yes Stairs assistance: Min guard Stair Management: No rails;Backwards;Forwards;With walker Number of Stairs: 1 (x3) General stair comments: VC's for sequencing and safety awareness.   Wheelchair Mobility    Modified Rankin (Stroke Patients Only)       Balance Overall balance assessment: Needs assistance Sitting-balance support: Feet supported Sitting balance-Leahy Scale: Fair     Standing balance support: Bilateral upper extremity supported Standing balance-Leahy Scale: Poor                      Cognition Arousal/Alertness: Awake/alert Behavior During Therapy: WFL for tasks assessed/performed Overall Cognitive Status: Within Functional Limits for tasks assessed                      Exercises Total Joint Exercises Ankle Circles/Pumps: 10 reps Quad Sets: 10 reps Short Arc Quad: 10 reps Heel Slides: 10 reps Hip ABduction/ADduction: 10 reps Straight Leg Raises: 10 reps Long Arc Quad: 10 reps Knee Flexion: 10 reps Goniometric ROM: 87 AAROM    General Comments General comments (skin integrity, edema, etc.): Issued HEP. Discussed technique, reps, sets, frequency.       Pertinent Vitals/Pain Pt reports minimal pain throughout session and rates it 2/10.    Home Living                      Prior Function            PT Goals (current goals can now be found in the care plan section) Acute Rehab PT Goals Patient Stated Goal: To  return home PT Goal Formulation: With patient/family Time For Goal Achievement: 04/02/14 Potential to Achieve Goals: Good Progress towards PT goals: Progressing toward goals    Frequency  7X/week    PT Plan Current plan remains appropriate    Co-evaluation             End of Session Equipment Utilized During Treatment: Gait belt Activity Tolerance: Patient tolerated treatment well Patient left: in chair;with  call bell/phone within reach;with family/visitor present     Time: 1194-1740 PT Time Calculation (min): 46 min  Charges:  $Gait Training: 8-22 mins $Therapeutic Exercise: 8-22 mins $Therapeutic Activity: 8-22 mins                    G Codes:      Jolyn Lent Apr 26, 2014, 5:03 PM  Jolyn Lent, PT, DPT Acute Rehabilitation Services Pager: [phone]

## 2014-03-27 NOTE — Discharge Summary (Signed)
Physician Discharge Summary  Patient ID: Tanner Griffin MRN: [id number] DOB/AGE: 71/14/1942 71 y.o.  Admit date: 03/26/2014 Discharge date: 03/27/2014  Admission Diagnoses:  Osteoarthritis of left knee  Discharge Diagnoses:  Principal Problem:   Osteoarthritis of left knee Active Problems:   Arthrofibrosis of total knee arthroplasty, right   Knee osteoarthritis   Past Medical History  Diagnosis Date  . HYPERLIPIDEMIA     takes Lipitor daily  . dm 2     pt denies dm  . Diabetes mellitus     not taking any med  . HYPERTENSION     takes Amlodipine and Labetalol daily  . Constipation     takes Miralax daily as needed  . Pneumonia     hx of-as a child  . Arthritis   . Joint pain   . Joint swelling   . Urinary frequency     takes Flomax daily  . Nocturia   . Arthrofibrosis of total knee arthroplasty, right 03/26/2014  . Osteoarthritis of left knee 03/26/2014    Surgeries: Procedure(s): TOTAL KNEE ARTHROPLASTY CLOSED MANIPULATION KNEE on 03/26/2014   Consultants (if any):    Discharged Condition: Improved  Hospital Course: Tanner Griffin is an 73 y.o. male who was admitted 03/26/2014 with a diagnosis of Osteoarthritis of left knee and went to the operating room on 03/26/2014 and underwent the above named procedures.    He was given perioperative antibiotics:  Anti-infectives   Start     Dose/Rate Route Frequency Ordered Stop   03/26/14 1400  ceFAZolin (ANCEF) IVPB 2 g/50 mL premix     2 g 100 mL/hr over 30 Minutes Intravenous Every 6 hours 03/26/14 1323 03/26/14 2018   03/26/14 0600  ceFAZolin (ANCEF) IVPB 2 g/50 mL premix     2 g 100 mL/hr over 30 Minutes Intravenous On call to O.R. 03/25/14 1409 03/26/14 0744    .  He was given sequential compression devices, early ambulation, and lovenox for DVT prophylaxis.  He benefited maximally from the hospital stay and there were no complications.    Recent vital signs:  Filed Vitals:   03/27/14 0400  BP:   Pulse:    Temp:   Resp: 18    Recent laboratory studies:  Lab Results  Component Value Date   HGB 12.3* 03/26/2014   HGB 13.3 03/18/2014   HGB 9.2* 09/19/2010   Lab Results  Component Value Date   WBC 12.2* 03/26/2014   PLT 242 03/26/2014   Lab Results  Component Value Date   INR 1.05 03/18/2014   Lab Results  Component Value Date   NA 141 03/18/2014   K 4.6 03/18/2014   CL 105 03/18/2014   CO2 23 03/18/2014   BUN 14 03/18/2014   CREATININE 0.83 03/26/2014   GLUCOSE 120* 03/18/2014    Discharge Medications:     Medication List    STOP taking these medications       meloxicam 15 MG tablet  Commonly known as:  MOBIC      TAKE these medications       amLODipine 10 MG tablet  Commonly known as:  NORVASC  Take 10 mg by mouth daily.     aspirin EC 81 MG tablet  Take 81 mg by mouth daily.     atorvastatin 20 MG tablet  Commonly known as:  LIPITOR  Take 1 tablet (20 mg total) by mouth daily.     B-COMPLEX PO  Take 1 tablet by mouth daily.  baclofen 10 MG tablet  Commonly known as:  LIORESAL  Take 1 tablet (10 mg total) by mouth 3 (three) times daily. As needed for muscle spasm     enoxaparin 30 MG/0.3ML injection  Commonly known as:  LOVENOX  Inject 0.3 mLs (30 mg total) into the skin every 12 (twelve) hours.     labetalol 200 MG tablet  Commonly known as:  NORMODYNE  Take 200 mg by mouth 2 (two) times daily.     nitroGLYCERIN 0.4 MG SL tablet  Commonly known as:  NITROSTAT  Place 1 tablet (0.4 mg total) under the tongue every 5 (five) minutes as needed for chest pain.     ondansetron 4 MG tablet  Commonly known as:  ZOFRAN  Take 1 tablet (4 mg total) by mouth every 8 (eight) hours as needed for nausea or vomiting.     oxyCODONE-acetaminophen 10-325 MG per tablet  Commonly known as:  PERCOCET  Take 1-2 tablets by mouth every 6 (six) hours as needed for pain. MAXIMUM TOTAL ACETAMINOPHEN DOSE IS 4000 MG PER DAY     polyethylene glycol packet  Commonly known as:   MIRALAX / GLYCOLAX  Take 17 g by mouth 2 (two) times daily.     senna-docusate 8.6-50 MG per tablet  Commonly known as:  Senokot-S  Take 1 tablet by mouth daily.     sennosides-docusate sodium 8.6-50 MG tablet  Commonly known as:  SENOKOT-S  Take 2 tablets by mouth daily.     tamsulosin 0.4 MG Caps capsule  Commonly known as:  FLOMAX  Take 1 capsule (0.4 mg total) by mouth daily.        Diagnostic Studies: Dg Chest 2 View  03/18/2014   CLINICAL DATA:  Preop.  Hypertension.  EXAM: CHEST  2 VIEW  COMPARISON:  03/02/2012  FINDINGS: Cardiac silhouette is normal in size. Normal mediastinal and hilar contours clear lungs. No pleural effusion or pneumothorax.  Bony thorax is demineralized but intact.  IMPRESSION: No active cardiopulmonary disease.   Electronically Signed   By: Tanner Griffin M.D.   On: 03/18/2014 10:55   Dg Knee Left Port  03/26/2014   CLINICAL DATA:  Arthroplasty.  EXAM: PORTABLE LEFT KNEE - 1-2 VIEW  COMPARISON:  DG KNEE COMPLETE 4 VIEWS*L* dated 11/30/2013  FINDINGS: Patient status Griffin total left knee replacement. Postop changes are present. Good anatomic alignment is noted. No acute abnormality identified.  IMPRESSION: Good anatomic alignment Griffin total left knee replacement.   Electronically Signed   By: Tanner Griffin  Register   On: 03/26/2014 13:07    Disposition:       Discharge Orders   Future Orders Complete By Expires   Weight bearing as tolerated  As directed    Questions:     Laterality:     Extremity:        Follow-up Information   Follow up with Shericka Johnstone P, MD. Schedule an appointment as soon as possible for a visit in 2 weeks.   Specialty:  Orthopedic Surgery   Contact information:   [address] [place] [address] [phone]        Signed: Eulas Griffin 03/27/2014, 8:33 AM

## 2014-03-27 NOTE — Progress Notes (Signed)
OT Cancellation and Discharge Note  Patient Details Name: Tanner Griffin MRN: [id number] DOB: February 21, 1941   Cancelled Treatment:    Reason Eval/Treat Not Completed: OT screened, no needs identified, will sign off. Pt with hx of TKA and reports that wife will be available to assist 24/7 with ADLs. Pt has no OT concerns and plans to participate in PT with hopes of d/c home soon. Pt has reacher and education completed for use of reacher for LB dressing and pt was receptive to information. Acute OT to sign off.   Juluis Rainier [phone] 03/27/2014, 12:17 PM

## 2014-03-27 NOTE — Progress Notes (Signed)
Physical Therapy Treatment Patient Details Name: Tanner Griffin MRN: [id number] DOB: 1941-04-24 Today's Date: 03/27/2014    History of Present Illness Pt is a 73 y/o male admitted s/p elective L TKA and closed manipulation of R knee.    PT Comments    Pt progressing well towards physical therapy goals. Stair training to be initiated in second session this afternoon. Will continue to follow.   Follow Up Recommendations  Home health PT     Equipment Recommendations  None recommended by PT    Recommendations for Other Services       Precautions / Restrictions Precautions Precautions: Fall;Knee Precaution Comments: Discussed towel roll under heel and NO pillow under knee.  Restrictions Weight Bearing Restrictions: Yes RLE Weight Bearing: Weight bearing as tolerated    Mobility  Bed Mobility Overal bed mobility: Needs Assistance Bed Mobility: Supine to Sit     Supine to sit: Min guard     General bed mobility comments: VC's for sequencing and technique. Was able to transition to EOB without assist for LLE. Upon full sit, pt required assist to maintain balance at EOB initially, but then was independent.   Transfers Overall transfer level: Needs assistance Equipment used: Rolling walker (2 wheeled) Transfers: Sit to/from Stand Sit to Stand: Mod assist         General transfer comment: VC's for hand placement on seated surface for safety.   Ambulation/Gait Ambulation/Gait assistance: Min guard Ambulation Distance (Feet): 100 Feet Assistive device: Rolling walker (2 wheeled) Gait Pattern/deviations: Step-to pattern;Step-through pattern;Decreased stride length;Trunk flexed Gait velocity: Decreased Gait velocity interpretation: Below normal speed for age/gender General Gait Details: VC's for sequencing, increased heel strike, improved posture, and increased toe off during gait training.    Stairs            Wheelchair Mobility    Modified Rankin (Stroke  Patients Only)       Balance Overall balance assessment: Needs assistance Sitting-balance support: Feet supported Sitting balance-Leahy Scale: Fair     Standing balance support: Bilateral upper extremity supported Standing balance-Leahy Scale: Poor                      Cognition Arousal/Alertness: Awake/alert Behavior During Therapy: WFL for tasks assessed/performed Overall Cognitive Status: Within Functional Limits for tasks assessed                      Exercises Total Joint Exercises Ankle Circles/Pumps: 10 reps Quad Sets: 10 reps Hip ABduction/ADduction: 10 reps Long Arc Quad: 10 reps Knee Flexion: 10 reps Goniometric ROM: 80 AAROM    General Comments General comments (skin integrity, edema, etc.): Upon entry, pt states he may have spilled his urinal in the bed. During gait training, pt incontinent of stool in hallway. Unclear if pt is incontinent of urine as well or if he in fact spilled it in the bed.       Pertinent Vitals/Pain Pt reports minimal pain throughout session.     Home Living                      Prior Function            PT Goals (current goals can now be found in the care plan section) Acute Rehab PT Goals Patient Stated Goal: To return home PT Goal Formulation: With patient/family Time For Goal Achievement: 04/02/14 Potential to Achieve Goals: Good Progress towards PT goals: Progressing toward goals  Frequency  7X/week    PT Plan Current plan remains appropriate    Co-evaluation             End of Session Equipment Utilized During Treatment: Gait belt Activity Tolerance: Patient tolerated treatment well Patient left: in chair;with call bell/phone within reach;with family/visitor present     Time: 2947-6546 PT Time Calculation (min): 37 min  Charges:  $Gait Training: 8-22 mins $Therapeutic Exercise: 8-22 mins                    G Codes:      Jolyn Lent 04-09-2014, 1:27 PM  Jolyn Lent, PT, DPT Acute Rehabilitation Services Pager: [phone]

## 2014-03-27 NOTE — Plan of Care (Signed)
Problem: Consults Goal: Diagnosis- Total Joint Replacement Outcome: Completed/Met Date Met:  03/27/14 Primary Total Knee Left

## 2014-03-28 ENCOUNTER — Encounter (HOSPITAL_COMMUNITY): Payer: Self-pay | Admitting: Orthopedic Surgery

## 2014-03-28 DIAGNOSIS — Z7901 Long term (current) use of anticoagulants: Secondary | ICD-10-CM | POA: Diagnosis not present

## 2014-03-28 DIAGNOSIS — Z96659 Presence of unspecified artificial knee joint: Secondary | ICD-10-CM | POA: Diagnosis not present

## 2014-03-28 DIAGNOSIS — Z471 Aftercare following joint replacement surgery: Secondary | ICD-10-CM | POA: Diagnosis not present

## 2014-03-28 DIAGNOSIS — I10 Essential (primary) hypertension: Secondary | ICD-10-CM | POA: Diagnosis not present

## 2014-03-28 DIAGNOSIS — Z4789 Encounter for other orthopedic aftercare: Secondary | ICD-10-CM | POA: Diagnosis not present

## 2014-03-28 DIAGNOSIS — IMO0001 Reserved for inherently not codable concepts without codable children: Secondary | ICD-10-CM | POA: Diagnosis not present

## 2014-03-28 DIAGNOSIS — Z794 Long term (current) use of insulin: Secondary | ICD-10-CM | POA: Diagnosis not present

## 2014-03-29 DIAGNOSIS — IMO0001 Reserved for inherently not codable concepts without codable children: Secondary | ICD-10-CM | POA: Diagnosis not present

## 2014-03-29 DIAGNOSIS — Z4789 Encounter for other orthopedic aftercare: Secondary | ICD-10-CM | POA: Diagnosis not present

## 2014-03-29 DIAGNOSIS — Z96659 Presence of unspecified artificial knee joint: Secondary | ICD-10-CM | POA: Diagnosis not present

## 2014-03-29 DIAGNOSIS — Z7901 Long term (current) use of anticoagulants: Secondary | ICD-10-CM | POA: Diagnosis not present

## 2014-03-29 DIAGNOSIS — Z471 Aftercare following joint replacement surgery: Secondary | ICD-10-CM | POA: Diagnosis not present

## 2014-03-29 DIAGNOSIS — I10 Essential (primary) hypertension: Secondary | ICD-10-CM | POA: Diagnosis not present

## 2014-03-30 DIAGNOSIS — I10 Essential (primary) hypertension: Secondary | ICD-10-CM | POA: Diagnosis not present

## 2014-03-30 DIAGNOSIS — Z471 Aftercare following joint replacement surgery: Secondary | ICD-10-CM | POA: Diagnosis not present

## 2014-03-30 DIAGNOSIS — Z96659 Presence of unspecified artificial knee joint: Secondary | ICD-10-CM | POA: Diagnosis not present

## 2014-03-30 DIAGNOSIS — Z7901 Long term (current) use of anticoagulants: Secondary | ICD-10-CM | POA: Diagnosis not present

## 2014-03-30 DIAGNOSIS — Z4789 Encounter for other orthopedic aftercare: Secondary | ICD-10-CM | POA: Diagnosis not present

## 2014-03-30 DIAGNOSIS — IMO0001 Reserved for inherently not codable concepts without codable children: Secondary | ICD-10-CM | POA: Diagnosis not present

## 2014-03-31 DIAGNOSIS — Z4789 Encounter for other orthopedic aftercare: Secondary | ICD-10-CM | POA: Diagnosis not present

## 2014-03-31 DIAGNOSIS — Z7901 Long term (current) use of anticoagulants: Secondary | ICD-10-CM | POA: Diagnosis not present

## 2014-03-31 DIAGNOSIS — IMO0001 Reserved for inherently not codable concepts without codable children: Secondary | ICD-10-CM | POA: Diagnosis not present

## 2014-03-31 DIAGNOSIS — Z96659 Presence of unspecified artificial knee joint: Secondary | ICD-10-CM | POA: Diagnosis not present

## 2014-03-31 DIAGNOSIS — Z471 Aftercare following joint replacement surgery: Secondary | ICD-10-CM | POA: Diagnosis not present

## 2014-03-31 DIAGNOSIS — I10 Essential (primary) hypertension: Secondary | ICD-10-CM | POA: Diagnosis not present

## 2014-04-01 DIAGNOSIS — I10 Essential (primary) hypertension: Secondary | ICD-10-CM | POA: Diagnosis not present

## 2014-04-01 DIAGNOSIS — Z471 Aftercare following joint replacement surgery: Secondary | ICD-10-CM | POA: Diagnosis not present

## 2014-04-01 DIAGNOSIS — IMO0001 Reserved for inherently not codable concepts without codable children: Secondary | ICD-10-CM | POA: Diagnosis not present

## 2014-04-01 DIAGNOSIS — Z7901 Long term (current) use of anticoagulants: Secondary | ICD-10-CM | POA: Diagnosis not present

## 2014-04-01 DIAGNOSIS — Z96659 Presence of unspecified artificial knee joint: Secondary | ICD-10-CM | POA: Diagnosis not present

## 2014-04-01 DIAGNOSIS — Z4789 Encounter for other orthopedic aftercare: Secondary | ICD-10-CM | POA: Diagnosis not present

## 2014-04-02 DIAGNOSIS — IMO0001 Reserved for inherently not codable concepts without codable children: Secondary | ICD-10-CM | POA: Diagnosis not present

## 2014-04-02 DIAGNOSIS — Z7901 Long term (current) use of anticoagulants: Secondary | ICD-10-CM | POA: Diagnosis not present

## 2014-04-02 DIAGNOSIS — Z96659 Presence of unspecified artificial knee joint: Secondary | ICD-10-CM | POA: Diagnosis not present

## 2014-04-02 DIAGNOSIS — Z471 Aftercare following joint replacement surgery: Secondary | ICD-10-CM | POA: Diagnosis not present

## 2014-04-02 DIAGNOSIS — Z4789 Encounter for other orthopedic aftercare: Secondary | ICD-10-CM | POA: Diagnosis not present

## 2014-04-02 DIAGNOSIS — I10 Essential (primary) hypertension: Secondary | ICD-10-CM | POA: Diagnosis not present

## 2014-04-03 ENCOUNTER — Ambulatory Visit: Payer: Medicare Other | Admitting: Family Medicine

## 2014-04-03 DIAGNOSIS — IMO0001 Reserved for inherently not codable concepts without codable children: Secondary | ICD-10-CM | POA: Diagnosis not present

## 2014-04-03 DIAGNOSIS — Z7901 Long term (current) use of anticoagulants: Secondary | ICD-10-CM | POA: Diagnosis not present

## 2014-04-03 DIAGNOSIS — Z4789 Encounter for other orthopedic aftercare: Secondary | ICD-10-CM | POA: Diagnosis not present

## 2014-04-03 DIAGNOSIS — I10 Essential (primary) hypertension: Secondary | ICD-10-CM | POA: Diagnosis not present

## 2014-04-03 DIAGNOSIS — Z471 Aftercare following joint replacement surgery: Secondary | ICD-10-CM | POA: Diagnosis not present

## 2014-04-03 DIAGNOSIS — Z96659 Presence of unspecified artificial knee joint: Secondary | ICD-10-CM | POA: Diagnosis not present

## 2014-04-04 DIAGNOSIS — I10 Essential (primary) hypertension: Secondary | ICD-10-CM | POA: Diagnosis not present

## 2014-04-04 DIAGNOSIS — Z96659 Presence of unspecified artificial knee joint: Secondary | ICD-10-CM | POA: Diagnosis not present

## 2014-04-04 DIAGNOSIS — Z7901 Long term (current) use of anticoagulants: Secondary | ICD-10-CM | POA: Diagnosis not present

## 2014-04-04 DIAGNOSIS — IMO0001 Reserved for inherently not codable concepts without codable children: Secondary | ICD-10-CM | POA: Diagnosis not present

## 2014-04-04 DIAGNOSIS — Z4789 Encounter for other orthopedic aftercare: Secondary | ICD-10-CM | POA: Diagnosis not present

## 2014-04-04 DIAGNOSIS — Z471 Aftercare following joint replacement surgery: Secondary | ICD-10-CM | POA: Diagnosis not present

## 2014-04-05 DIAGNOSIS — Z7901 Long term (current) use of anticoagulants: Secondary | ICD-10-CM | POA: Diagnosis not present

## 2014-04-05 DIAGNOSIS — Z471 Aftercare following joint replacement surgery: Secondary | ICD-10-CM | POA: Diagnosis not present

## 2014-04-05 DIAGNOSIS — I10 Essential (primary) hypertension: Secondary | ICD-10-CM | POA: Diagnosis not present

## 2014-04-05 DIAGNOSIS — Z4789 Encounter for other orthopedic aftercare: Secondary | ICD-10-CM | POA: Diagnosis not present

## 2014-04-05 DIAGNOSIS — Z96659 Presence of unspecified artificial knee joint: Secondary | ICD-10-CM | POA: Diagnosis not present

## 2014-04-05 DIAGNOSIS — IMO0001 Reserved for inherently not codable concepts without codable children: Secondary | ICD-10-CM | POA: Diagnosis not present

## 2014-04-08 DIAGNOSIS — Z471 Aftercare following joint replacement surgery: Secondary | ICD-10-CM | POA: Diagnosis not present

## 2014-04-08 DIAGNOSIS — Z96659 Presence of unspecified artificial knee joint: Secondary | ICD-10-CM | POA: Diagnosis not present

## 2014-04-08 DIAGNOSIS — IMO0001 Reserved for inherently not codable concepts without codable children: Secondary | ICD-10-CM | POA: Diagnosis not present

## 2014-04-08 DIAGNOSIS — Z4789 Encounter for other orthopedic aftercare: Secondary | ICD-10-CM | POA: Diagnosis not present

## 2014-04-08 DIAGNOSIS — Z7901 Long term (current) use of anticoagulants: Secondary | ICD-10-CM | POA: Diagnosis not present

## 2014-04-08 DIAGNOSIS — I10 Essential (primary) hypertension: Secondary | ICD-10-CM | POA: Diagnosis not present

## 2014-04-09 DIAGNOSIS — Z7901 Long term (current) use of anticoagulants: Secondary | ICD-10-CM | POA: Diagnosis not present

## 2014-04-09 DIAGNOSIS — IMO0001 Reserved for inherently not codable concepts without codable children: Secondary | ICD-10-CM | POA: Diagnosis not present

## 2014-04-09 DIAGNOSIS — Z471 Aftercare following joint replacement surgery: Secondary | ICD-10-CM | POA: Diagnosis not present

## 2014-04-09 DIAGNOSIS — Z4789 Encounter for other orthopedic aftercare: Secondary | ICD-10-CM | POA: Diagnosis not present

## 2014-04-09 DIAGNOSIS — I10 Essential (primary) hypertension: Secondary | ICD-10-CM | POA: Diagnosis not present

## 2014-04-09 DIAGNOSIS — Z96659 Presence of unspecified artificial knee joint: Secondary | ICD-10-CM | POA: Diagnosis not present

## 2014-04-10 DIAGNOSIS — IMO0001 Reserved for inherently not codable concepts without codable children: Secondary | ICD-10-CM | POA: Diagnosis not present

## 2014-04-10 DIAGNOSIS — Z471 Aftercare following joint replacement surgery: Secondary | ICD-10-CM | POA: Diagnosis not present

## 2014-04-10 DIAGNOSIS — Z96659 Presence of unspecified artificial knee joint: Secondary | ICD-10-CM | POA: Diagnosis not present

## 2014-04-10 DIAGNOSIS — Z7901 Long term (current) use of anticoagulants: Secondary | ICD-10-CM | POA: Diagnosis not present

## 2014-04-10 DIAGNOSIS — Z4789 Encounter for other orthopedic aftercare: Secondary | ICD-10-CM | POA: Diagnosis not present

## 2014-04-10 DIAGNOSIS — I10 Essential (primary) hypertension: Secondary | ICD-10-CM | POA: Diagnosis not present

## 2014-04-11 DIAGNOSIS — IMO0001 Reserved for inherently not codable concepts without codable children: Secondary | ICD-10-CM | POA: Diagnosis not present

## 2014-04-11 DIAGNOSIS — Z96659 Presence of unspecified artificial knee joint: Secondary | ICD-10-CM | POA: Diagnosis not present

## 2014-04-11 DIAGNOSIS — Z4789 Encounter for other orthopedic aftercare: Secondary | ICD-10-CM | POA: Diagnosis not present

## 2014-04-11 DIAGNOSIS — I10 Essential (primary) hypertension: Secondary | ICD-10-CM | POA: Diagnosis not present

## 2014-04-11 DIAGNOSIS — Z471 Aftercare following joint replacement surgery: Secondary | ICD-10-CM | POA: Diagnosis not present

## 2014-04-11 DIAGNOSIS — Z7901 Long term (current) use of anticoagulants: Secondary | ICD-10-CM | POA: Diagnosis not present

## 2014-04-12 ENCOUNTER — Other Ambulatory Visit: Payer: Self-pay | Admitting: Internal Medicine

## 2014-04-12 DIAGNOSIS — I10 Essential (primary) hypertension: Secondary | ICD-10-CM | POA: Diagnosis not present

## 2014-04-12 DIAGNOSIS — Z4789 Encounter for other orthopedic aftercare: Secondary | ICD-10-CM | POA: Diagnosis not present

## 2014-04-12 DIAGNOSIS — Z471 Aftercare following joint replacement surgery: Secondary | ICD-10-CM | POA: Diagnosis not present

## 2014-04-12 DIAGNOSIS — IMO0001 Reserved for inherently not codable concepts without codable children: Secondary | ICD-10-CM | POA: Diagnosis not present

## 2014-04-12 DIAGNOSIS — Z96659 Presence of unspecified artificial knee joint: Secondary | ICD-10-CM | POA: Diagnosis not present

## 2014-04-12 DIAGNOSIS — Z7901 Long term (current) use of anticoagulants: Secondary | ICD-10-CM | POA: Diagnosis not present

## 2014-04-16 ENCOUNTER — Other Ambulatory Visit: Payer: Self-pay | Admitting: Internal Medicine

## 2014-04-22 DIAGNOSIS — M25619 Stiffness of unspecified shoulder, not elsewhere classified: Secondary | ICD-10-CM | POA: Diagnosis not present

## 2014-04-22 DIAGNOSIS — R262 Difficulty in walking, not elsewhere classified: Secondary | ICD-10-CM | POA: Diagnosis not present

## 2014-04-22 DIAGNOSIS — M25519 Pain in unspecified shoulder: Secondary | ICD-10-CM | POA: Diagnosis not present

## 2014-04-22 DIAGNOSIS — Z96659 Presence of unspecified artificial knee joint: Secondary | ICD-10-CM | POA: Diagnosis not present

## 2014-04-24 DIAGNOSIS — M25669 Stiffness of unspecified knee, not elsewhere classified: Secondary | ICD-10-CM | POA: Diagnosis not present

## 2014-04-24 DIAGNOSIS — R262 Difficulty in walking, not elsewhere classified: Secondary | ICD-10-CM | POA: Diagnosis not present

## 2014-04-24 DIAGNOSIS — M25569 Pain in unspecified knee: Secondary | ICD-10-CM | POA: Diagnosis not present

## 2014-04-24 DIAGNOSIS — Z96659 Presence of unspecified artificial knee joint: Secondary | ICD-10-CM | POA: Diagnosis not present

## 2014-04-25 DIAGNOSIS — Z96659 Presence of unspecified artificial knee joint: Secondary | ICD-10-CM | POA: Diagnosis not present

## 2014-04-25 DIAGNOSIS — M1710 Unilateral primary osteoarthritis, unspecified knee: Secondary | ICD-10-CM | POA: Diagnosis not present

## 2014-04-25 DIAGNOSIS — Z471 Aftercare following joint replacement surgery: Secondary | ICD-10-CM | POA: Diagnosis not present

## 2014-04-30 DIAGNOSIS — M25669 Stiffness of unspecified knee, not elsewhere classified: Secondary | ICD-10-CM | POA: Diagnosis not present

## 2014-04-30 DIAGNOSIS — Z96659 Presence of unspecified artificial knee joint: Secondary | ICD-10-CM | POA: Diagnosis not present

## 2014-04-30 DIAGNOSIS — M25569 Pain in unspecified knee: Secondary | ICD-10-CM | POA: Diagnosis not present

## 2014-05-01 DIAGNOSIS — M25569 Pain in unspecified knee: Secondary | ICD-10-CM | POA: Diagnosis not present

## 2014-05-01 DIAGNOSIS — Z96659 Presence of unspecified artificial knee joint: Secondary | ICD-10-CM | POA: Diagnosis not present

## 2014-05-01 DIAGNOSIS — R262 Difficulty in walking, not elsewhere classified: Secondary | ICD-10-CM | POA: Diagnosis not present

## 2014-05-01 DIAGNOSIS — Z471 Aftercare following joint replacement surgery: Secondary | ICD-10-CM | POA: Diagnosis not present

## 2014-05-01 DIAGNOSIS — M25669 Stiffness of unspecified knee, not elsewhere classified: Secondary | ICD-10-CM | POA: Diagnosis not present

## 2014-05-01 DIAGNOSIS — M1710 Unilateral primary osteoarthritis, unspecified knee: Secondary | ICD-10-CM | POA: Diagnosis not present

## 2014-05-03 DIAGNOSIS — M1710 Unilateral primary osteoarthritis, unspecified knee: Secondary | ICD-10-CM | POA: Diagnosis not present

## 2014-05-03 DIAGNOSIS — R262 Difficulty in walking, not elsewhere classified: Secondary | ICD-10-CM | POA: Diagnosis not present

## 2014-05-03 DIAGNOSIS — Z471 Aftercare following joint replacement surgery: Secondary | ICD-10-CM | POA: Diagnosis not present

## 2014-05-03 DIAGNOSIS — M25669 Stiffness of unspecified knee, not elsewhere classified: Secondary | ICD-10-CM | POA: Diagnosis not present

## 2014-05-03 DIAGNOSIS — Z96659 Presence of unspecified artificial knee joint: Secondary | ICD-10-CM | POA: Diagnosis not present

## 2014-05-03 DIAGNOSIS — M25569 Pain in unspecified knee: Secondary | ICD-10-CM | POA: Diagnosis not present

## 2014-05-07 DIAGNOSIS — M25669 Stiffness of unspecified knee, not elsewhere classified: Secondary | ICD-10-CM | POA: Diagnosis not present

## 2014-05-07 DIAGNOSIS — Z471 Aftercare following joint replacement surgery: Secondary | ICD-10-CM | POA: Diagnosis not present

## 2014-05-07 DIAGNOSIS — R262 Difficulty in walking, not elsewhere classified: Secondary | ICD-10-CM | POA: Diagnosis not present

## 2014-05-07 DIAGNOSIS — M1710 Unilateral primary osteoarthritis, unspecified knee: Secondary | ICD-10-CM | POA: Diagnosis not present

## 2014-05-07 DIAGNOSIS — M25569 Pain in unspecified knee: Secondary | ICD-10-CM | POA: Diagnosis not present

## 2014-05-07 DIAGNOSIS — Z96659 Presence of unspecified artificial knee joint: Secondary | ICD-10-CM | POA: Diagnosis not present

## 2014-05-09 DIAGNOSIS — Z96659 Presence of unspecified artificial knee joint: Secondary | ICD-10-CM | POA: Diagnosis not present

## 2014-05-09 DIAGNOSIS — R262 Difficulty in walking, not elsewhere classified: Secondary | ICD-10-CM | POA: Diagnosis not present

## 2014-05-09 DIAGNOSIS — M25569 Pain in unspecified knee: Secondary | ICD-10-CM | POA: Diagnosis not present

## 2014-05-09 DIAGNOSIS — M25669 Stiffness of unspecified knee, not elsewhere classified: Secondary | ICD-10-CM | POA: Diagnosis not present

## 2014-05-10 DIAGNOSIS — M25569 Pain in unspecified knee: Secondary | ICD-10-CM | POA: Diagnosis not present

## 2014-05-10 DIAGNOSIS — R262 Difficulty in walking, not elsewhere classified: Secondary | ICD-10-CM | POA: Diagnosis not present

## 2014-05-10 DIAGNOSIS — Z96659 Presence of unspecified artificial knee joint: Secondary | ICD-10-CM | POA: Diagnosis not present

## 2014-05-10 DIAGNOSIS — M25669 Stiffness of unspecified knee, not elsewhere classified: Secondary | ICD-10-CM | POA: Diagnosis not present

## 2014-05-10 DIAGNOSIS — M1710 Unilateral primary osteoarthritis, unspecified knee: Secondary | ICD-10-CM | POA: Diagnosis not present

## 2014-05-10 DIAGNOSIS — Z471 Aftercare following joint replacement surgery: Secondary | ICD-10-CM | POA: Diagnosis not present

## 2014-05-15 ENCOUNTER — Other Ambulatory Visit: Payer: Self-pay | Admitting: Family Medicine

## 2014-05-15 DIAGNOSIS — Z96659 Presence of unspecified artificial knee joint: Secondary | ICD-10-CM | POA: Diagnosis not present

## 2014-05-15 DIAGNOSIS — M25669 Stiffness of unspecified knee, not elsewhere classified: Secondary | ICD-10-CM | POA: Diagnosis not present

## 2014-05-15 DIAGNOSIS — M25569 Pain in unspecified knee: Secondary | ICD-10-CM | POA: Diagnosis not present

## 2014-05-15 DIAGNOSIS — R262 Difficulty in walking, not elsewhere classified: Secondary | ICD-10-CM | POA: Diagnosis not present

## 2014-05-17 DIAGNOSIS — R262 Difficulty in walking, not elsewhere classified: Secondary | ICD-10-CM | POA: Diagnosis not present

## 2014-05-17 DIAGNOSIS — M25669 Stiffness of unspecified knee, not elsewhere classified: Secondary | ICD-10-CM | POA: Diagnosis not present

## 2014-05-17 DIAGNOSIS — Z96659 Presence of unspecified artificial knee joint: Secondary | ICD-10-CM | POA: Diagnosis not present

## 2014-05-17 DIAGNOSIS — M25569 Pain in unspecified knee: Secondary | ICD-10-CM | POA: Diagnosis not present

## 2014-05-21 DIAGNOSIS — Z96659 Presence of unspecified artificial knee joint: Secondary | ICD-10-CM | POA: Diagnosis not present

## 2014-05-21 DIAGNOSIS — M25669 Stiffness of unspecified knee, not elsewhere classified: Secondary | ICD-10-CM | POA: Diagnosis not present

## 2014-05-21 DIAGNOSIS — M25569 Pain in unspecified knee: Secondary | ICD-10-CM | POA: Diagnosis not present

## 2014-05-21 DIAGNOSIS — R262 Difficulty in walking, not elsewhere classified: Secondary | ICD-10-CM | POA: Diagnosis not present

## 2014-05-23 DIAGNOSIS — M25569 Pain in unspecified knee: Secondary | ICD-10-CM | POA: Diagnosis not present

## 2014-05-23 DIAGNOSIS — R262 Difficulty in walking, not elsewhere classified: Secondary | ICD-10-CM | POA: Diagnosis not present

## 2014-05-23 DIAGNOSIS — M25669 Stiffness of unspecified knee, not elsewhere classified: Secondary | ICD-10-CM | POA: Diagnosis not present

## 2014-05-23 DIAGNOSIS — Z96659 Presence of unspecified artificial knee joint: Secondary | ICD-10-CM | POA: Diagnosis not present

## 2014-05-28 DIAGNOSIS — R262 Difficulty in walking, not elsewhere classified: Secondary | ICD-10-CM | POA: Diagnosis not present

## 2014-05-28 DIAGNOSIS — Z96659 Presence of unspecified artificial knee joint: Secondary | ICD-10-CM | POA: Diagnosis not present

## 2014-05-28 DIAGNOSIS — M25569 Pain in unspecified knee: Secondary | ICD-10-CM | POA: Diagnosis not present

## 2014-05-28 DIAGNOSIS — M25669 Stiffness of unspecified knee, not elsewhere classified: Secondary | ICD-10-CM | POA: Diagnosis not present

## 2014-05-29 DIAGNOSIS — Z96659 Presence of unspecified artificial knee joint: Secondary | ICD-10-CM | POA: Diagnosis not present

## 2014-05-29 DIAGNOSIS — M25569 Pain in unspecified knee: Secondary | ICD-10-CM | POA: Diagnosis not present

## 2014-05-29 DIAGNOSIS — M25669 Stiffness of unspecified knee, not elsewhere classified: Secondary | ICD-10-CM | POA: Diagnosis not present

## 2014-05-29 DIAGNOSIS — R262 Difficulty in walking, not elsewhere classified: Secondary | ICD-10-CM | POA: Diagnosis not present

## 2014-05-31 DIAGNOSIS — M25569 Pain in unspecified knee: Secondary | ICD-10-CM | POA: Diagnosis not present

## 2014-05-31 DIAGNOSIS — Z96659 Presence of unspecified artificial knee joint: Secondary | ICD-10-CM | POA: Diagnosis not present

## 2014-05-31 DIAGNOSIS — M25669 Stiffness of unspecified knee, not elsewhere classified: Secondary | ICD-10-CM | POA: Diagnosis not present

## 2014-05-31 DIAGNOSIS — R262 Difficulty in walking, not elsewhere classified: Secondary | ICD-10-CM | POA: Diagnosis not present

## 2014-06-04 DIAGNOSIS — R262 Difficulty in walking, not elsewhere classified: Secondary | ICD-10-CM | POA: Diagnosis not present

## 2014-06-04 DIAGNOSIS — Z96659 Presence of unspecified artificial knee joint: Secondary | ICD-10-CM | POA: Diagnosis not present

## 2014-06-04 DIAGNOSIS — M25569 Pain in unspecified knee: Secondary | ICD-10-CM | POA: Diagnosis not present

## 2014-06-04 DIAGNOSIS — M25669 Stiffness of unspecified knee, not elsewhere classified: Secondary | ICD-10-CM | POA: Diagnosis not present

## 2014-06-05 ENCOUNTER — Ambulatory Visit (INDEPENDENT_AMBULATORY_CARE_PROVIDER_SITE_OTHER): Payer: Medicare Other | Admitting: Family Medicine

## 2014-06-05 ENCOUNTER — Encounter: Payer: Self-pay | Admitting: Family Medicine

## 2014-06-05 VITALS — BP 130/74 | HR 79 | Wt 171.0 lb

## 2014-06-05 DIAGNOSIS — G253 Myoclonus: Secondary | ICD-10-CM | POA: Diagnosis not present

## 2014-06-05 NOTE — Patient Instructions (Signed)
We will call you with neurology referral. 

## 2014-06-05 NOTE — Progress Notes (Signed)
Subjective:    Patient ID: Tanner Griffin, male    DOB: 01/02/1941, 73 y.o.   MRN: [id number]  HPI Patient seen with a new problem of one month history of involuntary movements right upper extremity which occurs only at night. This usually occurs after falling asleep and have frequently wakened his wife as well as patient. He does have recollection of events. He states usually has a duration about one hour of right arm and" jumping" or jerking. Apparently he has fairly vigorous movement   Does not describe any muscle fasciculations. He has no control over the symptoms. No associated pain. No confusion. No headaches. No weakness. He's never had these symptoms during the day. No history of seizure disorder. No history of any reported tremors.  His chronic problems include history of type 2 diabetes, hyperlipidemia, hypertension, BPH, osteoarthritis.   Past Medical History  Diagnosis Date  . HYPERLIPIDEMIA     takes Lipitor daily  . dm 2     pt denies dm  . Diabetes mellitus     not taking any med  . HYPERTENSION     takes Amlodipine and Labetalol daily  . Constipation     takes Miralax daily as needed  . Pneumonia     hx of-as a child  . Arthritis   . Joint pain   . Joint swelling   . Urinary frequency     takes Flomax daily  . Nocturia   . Arthrofibrosis of total knee arthroplasty, right 03/26/2014  . Osteoarthritis of left knee 03/26/2014   Past Surgical History  Procedure Laterality Date  . Total knee arthroplasty  2011    right  . Rt knee total replacement    . Colonoscopy    . Total knee arthroplasty Left 03/26/2014    DR LANDAU  . Total knee arthroplasty Left 03/26/2014    Procedure: TOTAL KNEE ARTHROPLASTY;  Surgeon: [name], MD;  Location: [facility];  Service: Orthopedics;  Laterality: Left;  . Knee closed reduction Right 03/26/2014    Procedure: CLOSED MANIPULATION KNEE;  Surgeon: [name], MD;  Location: [facility];  Service: Orthopedics;  Laterality: Right;    reports that he has never smoked. He has never used smokeless tobacco. He reports that he does not drink alcohol or use illicit drugs. family history is negative for Colon cancer, Stomach cancer, Esophageal cancer, and Rectal cancer. Allergies  Allergen Reactions  . Telmisartan Other (See Comments)    REACTION: headache       Review of Systems  Constitutional: Negative for appetite change and unexpected weight change.  Respiratory: Negative for shortness of breath.   Cardiovascular: Negative for chest pain and palpitations.  Neurological: Negative for dizziness, tremors, seizures, syncope, speech difficulty, weakness, numbness and headaches.  Psychiatric/Behavioral: Negative for confusion.       Objective:   Physical Exam  Constitutional: He is oriented to person, place, and time. He appears well-developed and well-nourished.  Eyes: Pupils are equal, round, and reactive to light.  Neck: Neck supple. No thyromegaly present.  Cardiovascular: Normal rate and regular rhythm.   Pulmonary/Chest: Effort normal and breath sounds normal. No respiratory distress. He has no wheezes. He has no rales.  Musculoskeletal: He exhibits no edema.  Neurological: He is alert and oriented to person, place, and time. No cranial nerve deficit.  Deep tendon reflexes symmetric upper extremities. No focal strength deficits. No tremor.  Psychiatric: He has a normal mood and affect. His behavior is normal.  Assessment & Plan:  Patient is describing ? focal monoclonas type activity involving right upper extremity. Symptoms occur strictly at night. Set up neurology referral or further evaluate.

## 2014-06-05 NOTE — Progress Notes (Signed)
Pre visit review using our clinic review tool, if applicable. No additional management support is needed unless otherwise documented below in the visit note. 

## 2014-06-07 DIAGNOSIS — M25669 Stiffness of unspecified knee, not elsewhere classified: Secondary | ICD-10-CM | POA: Diagnosis not present

## 2014-06-07 DIAGNOSIS — R262 Difficulty in walking, not elsewhere classified: Secondary | ICD-10-CM | POA: Diagnosis not present

## 2014-06-07 DIAGNOSIS — Z96659 Presence of unspecified artificial knee joint: Secondary | ICD-10-CM | POA: Diagnosis not present

## 2014-06-07 DIAGNOSIS — M25569 Pain in unspecified knee: Secondary | ICD-10-CM | POA: Diagnosis not present

## 2014-06-11 DIAGNOSIS — R262 Difficulty in walking, not elsewhere classified: Secondary | ICD-10-CM | POA: Diagnosis not present

## 2014-06-11 DIAGNOSIS — M25669 Stiffness of unspecified knee, not elsewhere classified: Secondary | ICD-10-CM | POA: Diagnosis not present

## 2014-06-11 DIAGNOSIS — M25569 Pain in unspecified knee: Secondary | ICD-10-CM | POA: Diagnosis not present

## 2014-06-11 DIAGNOSIS — Z96659 Presence of unspecified artificial knee joint: Secondary | ICD-10-CM | POA: Diagnosis not present

## 2014-06-13 DIAGNOSIS — M25669 Stiffness of unspecified knee, not elsewhere classified: Secondary | ICD-10-CM | POA: Diagnosis not present

## 2014-06-13 DIAGNOSIS — M25569 Pain in unspecified knee: Secondary | ICD-10-CM | POA: Diagnosis not present

## 2014-06-13 DIAGNOSIS — R262 Difficulty in walking, not elsewhere classified: Secondary | ICD-10-CM | POA: Diagnosis not present

## 2014-06-13 DIAGNOSIS — Z96659 Presence of unspecified artificial knee joint: Secondary | ICD-10-CM | POA: Diagnosis not present

## 2014-06-18 DIAGNOSIS — Z96659 Presence of unspecified artificial knee joint: Secondary | ICD-10-CM | POA: Diagnosis not present

## 2014-06-18 DIAGNOSIS — M25569 Pain in unspecified knee: Secondary | ICD-10-CM | POA: Diagnosis not present

## 2014-06-18 DIAGNOSIS — R262 Difficulty in walking, not elsewhere classified: Secondary | ICD-10-CM | POA: Diagnosis not present

## 2014-06-18 DIAGNOSIS — M25669 Stiffness of unspecified knee, not elsewhere classified: Secondary | ICD-10-CM | POA: Diagnosis not present

## 2014-06-19 DIAGNOSIS — M25569 Pain in unspecified knee: Secondary | ICD-10-CM | POA: Diagnosis not present

## 2014-06-19 DIAGNOSIS — Z96659 Presence of unspecified artificial knee joint: Secondary | ICD-10-CM | POA: Diagnosis not present

## 2014-06-19 DIAGNOSIS — M25669 Stiffness of unspecified knee, not elsewhere classified: Secondary | ICD-10-CM | POA: Diagnosis not present

## 2014-06-19 DIAGNOSIS — R262 Difficulty in walking, not elsewhere classified: Secondary | ICD-10-CM | POA: Diagnosis not present

## 2014-06-25 ENCOUNTER — Encounter: Payer: Self-pay | Admitting: Neurology

## 2014-06-25 ENCOUNTER — Ambulatory Visit (INDEPENDENT_AMBULATORY_CARE_PROVIDER_SITE_OTHER): Payer: Medicare Other | Admitting: Neurology

## 2014-06-25 VITALS — BP 122/70 | HR 64 | Resp 16 | Ht 68.0 in | Wt 173.0 lb

## 2014-06-25 DIAGNOSIS — R569 Unspecified convulsions: Secondary | ICD-10-CM

## 2014-06-25 NOTE — Progress Notes (Signed)
Tanner Griffin was seen today in neurologic consultation at the request of Dr. Elease Hashimoto. The consultation is for the evaluation of R arm jerking.  This patient is accompanied in the office by his spouse who supplements the history.  The records that were made available to me were reviewed.  Sx's never occur during the day.  It happens nearly every single night.  It starts 2.5 - 3 hours into sleep.  He had a L TKA and sx's started about 4 weeks after that.  The sx's consist of the R arm violently jerking rhythmically per the patient but his wife states that it may jerk once and then she will count to 20 seconds and it will happen again.  She is unsure how long it will happen.  The patient states that he may be aware of it if his wife awakens him but he cannot control it.  He will try to put the hand under the head and it will continue the shake.  About the last week, he noted that he had similar sx's in the L arm.  He takes a nap between 2-3pm and it doesn't happen then.  He has no hx of seizure.  Only new meds are baclofen and percocet since knee surgery.  No loss or alteration of consciousness even if awakened during the event.  Arm feels normal while jerking.  Neuroimaging has not previously been performed.     ALLERGIES:   Allergies  Allergen Reactions  . Telmisartan Other (See Comments)    REACTION: headache    CURRENT MEDICATIONS:  Current Outpatient Prescriptions on File Prior to Visit  Medication Sig Dispense Refill  . amLODipine (NORVASC) 10 MG tablet TAKE 1 TABLET BY MOUTH EVERY DAY  90 tablet  0  . aspirin EC 81 MG tablet Take 81 mg by mouth daily.      . baclofen (LIORESAL) 10 MG tablet Take 1 tablet (10 mg total) by mouth 3 (three) times daily. As needed for muscle spasm  50 each  0  . labetalol (NORMODYNE) 200 MG tablet TAKE 1 TABLET BY MOUTH TWICE DAILY  60 tablet  5  . oxyCODONE-acetaminophen (PERCOCET) 10-325 MG per tablet Take 1-2 tablets by mouth every 6 (six) hours as  needed for pain. MAXIMUM TOTAL ACETAMINOPHEN DOSE IS 4000 MG PER DAY  75 tablet  0  . polyethylene glycol (MIRALAX / GLYCOLAX) packet Take 17 g by mouth 2 (two) times daily.      . tamsulosin (FLOMAX) 0.4 MG CAPS Take 1 capsule (0.4 mg total) by mouth daily.  90 capsule  3  . nitroGLYCERIN (NITROSTAT) 0.4 MG SL tablet Place 1 tablet (0.4 mg total) under the tongue every 5 (five) minutes as needed for chest pain.  30 tablet  11   No current facility-administered medications on file prior to visit.    PAST MEDICAL HISTORY:   Past Medical History  Diagnosis Date  . HYPERLIPIDEMIA     takes Lipitor daily  . dm 2     pt denies dm  . Diabetes mellitus     not taking any med  . HYPERTENSION     takes Amlodipine and Labetalol daily  . Constipation     takes Miralax daily as needed  . Pneumonia     hx of-as a child  . Arthritis   . Joint pain   . Joint swelling   . Urinary frequency     takes Flomax daily  . Nocturia   .  Arthrofibrosis of total knee arthroplasty, right 03/26/2014  . Osteoarthritis of left knee 03/26/2014    PAST SURGICAL HISTORY:   Past Surgical History  Procedure Laterality Date  . Total knee arthroplasty  2011    right  . Rt knee total replacement    . Colonoscopy    . Total knee arthroplasty Left 03/26/2014    DR LANDAU  . Total knee arthroplasty Left 03/26/2014    Procedure: TOTAL KNEE ARTHROPLASTY;  Surgeon: [name], MD;  Location: [place] [facility];  Service: Orthopedics;  Laterality: Left;  . Knee closed reduction Right 03/26/2014    Procedure: CLOSED MANIPULATION KNEE;  Surgeon: [name], MD;  Location: [facility];  Service: Orthopedics;  Laterality: Right;    SOCIAL HISTORY:   History   Social History  . Marital Status: Married    Spouse Name: N/A    Number of Children: N/A  . Years of Education: N/A   Occupational History  . retired     Architect   Social History Main Topics  . Smoking status: Never Smoker   . Smokeless tobacco: Never Used  .  Alcohol Use: No  . Drug Use: No  . Sexual Activity: Yes   Other Topics Concern  . Not on file   Social History Narrative  . No narrative on file    FAMILY HISTORY:   Family Status  Relation Status Death Age  . Mother Deceased     unknown  . Father Deceased     brain cancer  . Sister Alive   . Sister Alive   . Sister Alive   . Sister Alive   . Sister Alive   . Brother Alive     kidney failure  . Brother Alive   . Brother Alive   . Brother Alive     ROS:  A complete 10 system review of systems was obtained and was unremarkable apart from what is mentioned above.  PHYSICAL EXAMINATION:    VITALS:   Filed Vitals:   06/25/14 0839  BP: 122/70  Pulse: 64  Resp: 16  Height: 5\' 8"  (1.727 m)  Weight: 173 lb (78.472 kg)    GEN:  Normal appears male in no acute distress.  Appears stated age. HEENT:  Normocephalic, atraumatic. The mucous membranes are moist. The superficial temporal arteries are without ropiness or tenderness. Cardiovascular: Regular rate and rhythm. Lungs: Clear to auscultation bilaterally. Neck/Heme: There are no carotid bruits noted bilaterally.  NEUROLOGICAL: Orientation:  The patient is alert and oriented x 3.  Fund of knowledge is appropriate.  Recent and remote memory intact.  Attention span and concentration normal.  Repeats and names without difficulty. Cranial nerves: There is good facial symmetry. The pupils are equal round and reactive to light bilaterally. Fundoscopic exam reveals clear disc margins bilaterally. Extraocular muscles are intact and visual fields are full to confrontational testing. Speech is fluent and clear. Soft palate rises symmetrically and there is no tongue deviation. Hearing is intact to conversational tone. Tone: Tone is good throughout. Sensation: Sensation is intact to light touch and pinprick throughout (facial, trunk, extremities). Vibration is intact at the bilateral big toe. There is no extinction with double  simultaneous stimulation. There is no sensory dermatomal level identified. Coordination:  The patient has no difficulty with RAM's or FNF bilaterally. Motor: Strength is 5/5 in the bilateral upper and right lower extremities.  Strength is 5/5 in the proximal left lower extremity, but he has pain on knee extension and  knee flexion, so I was unable to evaluate formal manual motor testing.  Strength, therefore distally he can only be quantified as at least 3/5. Shoulder shrug is equal and symmetric. There is no pronator drift.  There are no fasciculations noted. DTR's: Deep tendon reflexes are 2/4 at the bilateral biceps, triceps, brachioradialis, patella and trace at the bilateral achilles.  Plantar responses are downgoing bilaterally. Gait and Station: The patient has trouble arising out of the chair, primarily because of left knee pain.  Once he gets up, he uses the cane and drags the left leg when he walks.  Again, he reports that he has left knee pain when he walks.  LABS  Lab Results  Component Value Date   HGBA1C 5.8 11/30/2013     Chemistry      Component Value Date/Time   NA 143 03/27/2014 0830   K 4.3 03/27/2014 0830   CL 107 03/27/2014 0830   CO2 23 03/27/2014 0830   BUN 22 03/27/2014 0830   CREATININE 0.84 03/27/2014 0830      Component Value Date/Time   CALCIUM 9.2 03/27/2014 0830   ALKPHOS 150* 11/30/2013 0903   AST 18 11/30/2013 0903   ALT 17 11/30/2013 0903   BILITOT 0.8 11/30/2013 0903        IMPRESSION/PLAN  1. nocturnal spells  -These rhythmic contractions of the arm that occur virtually every night, and happen even if he is awakened, are somewhat concerning for seizure.  They can last up to 10 minutes.  They never happen during the daytime.  It would be unusual for myoclonus to be this rhythmic and this consistent, and one would think that parasomnias would not occur once he is awakened.  -We will do an ambulatory EEG.  -Given the focality, I think we need to do an MRI of the  brain.  -If this is negative, then we will go ahead and proceed with a nocturnal polysomnogram.

## 2014-06-25 NOTE — Patient Instructions (Signed)
1. We have scheduled you at [facility] for your MRI on 07/12/14 at 7:15 am. Please arrive 15 minutes prior and go to 1st floor radiology. If you need to reschedule for any reason please call [phone].

## 2014-06-26 DIAGNOSIS — Z96659 Presence of unspecified artificial knee joint: Secondary | ICD-10-CM | POA: Diagnosis not present

## 2014-06-26 DIAGNOSIS — R262 Difficulty in walking, not elsewhere classified: Secondary | ICD-10-CM | POA: Diagnosis not present

## 2014-06-26 DIAGNOSIS — M25569 Pain in unspecified knee: Secondary | ICD-10-CM | POA: Diagnosis not present

## 2014-06-26 DIAGNOSIS — M25669 Stiffness of unspecified knee, not elsewhere classified: Secondary | ICD-10-CM | POA: Diagnosis not present

## 2014-06-27 DIAGNOSIS — M25669 Stiffness of unspecified knee, not elsewhere classified: Secondary | ICD-10-CM | POA: Diagnosis not present

## 2014-06-27 DIAGNOSIS — R262 Difficulty in walking, not elsewhere classified: Secondary | ICD-10-CM | POA: Diagnosis not present

## 2014-06-27 DIAGNOSIS — Z96659 Presence of unspecified artificial knee joint: Secondary | ICD-10-CM | POA: Diagnosis not present

## 2014-06-27 DIAGNOSIS — M25569 Pain in unspecified knee: Secondary | ICD-10-CM | POA: Diagnosis not present

## 2014-07-02 DIAGNOSIS — M25569 Pain in unspecified knee: Secondary | ICD-10-CM | POA: Diagnosis not present

## 2014-07-02 DIAGNOSIS — Z96659 Presence of unspecified artificial knee joint: Secondary | ICD-10-CM | POA: Diagnosis not present

## 2014-07-02 DIAGNOSIS — R262 Difficulty in walking, not elsewhere classified: Secondary | ICD-10-CM | POA: Diagnosis not present

## 2014-07-02 DIAGNOSIS — M25669 Stiffness of unspecified knee, not elsewhere classified: Secondary | ICD-10-CM | POA: Diagnosis not present

## 2014-07-04 DIAGNOSIS — M25569 Pain in unspecified knee: Secondary | ICD-10-CM | POA: Diagnosis not present

## 2014-07-04 DIAGNOSIS — M25669 Stiffness of unspecified knee, not elsewhere classified: Secondary | ICD-10-CM | POA: Diagnosis not present

## 2014-07-04 DIAGNOSIS — R262 Difficulty in walking, not elsewhere classified: Secondary | ICD-10-CM | POA: Diagnosis not present

## 2014-07-04 DIAGNOSIS — Z96659 Presence of unspecified artificial knee joint: Secondary | ICD-10-CM | POA: Diagnosis not present

## 2014-07-09 DIAGNOSIS — M25669 Stiffness of unspecified knee, not elsewhere classified: Secondary | ICD-10-CM | POA: Diagnosis not present

## 2014-07-09 DIAGNOSIS — R262 Difficulty in walking, not elsewhere classified: Secondary | ICD-10-CM | POA: Diagnosis not present

## 2014-07-09 DIAGNOSIS — M25569 Pain in unspecified knee: Secondary | ICD-10-CM | POA: Diagnosis not present

## 2014-07-09 DIAGNOSIS — Z96659 Presence of unspecified artificial knee joint: Secondary | ICD-10-CM | POA: Diagnosis not present

## 2014-07-10 ENCOUNTER — Ambulatory Visit (INDEPENDENT_AMBULATORY_CARE_PROVIDER_SITE_OTHER): Payer: Medicare Other | Admitting: Neurology

## 2014-07-10 DIAGNOSIS — R569 Unspecified convulsions: Secondary | ICD-10-CM | POA: Diagnosis not present

## 2014-07-11 DIAGNOSIS — Z96659 Presence of unspecified artificial knee joint: Secondary | ICD-10-CM | POA: Diagnosis not present

## 2014-07-11 DIAGNOSIS — M25669 Stiffness of unspecified knee, not elsewhere classified: Secondary | ICD-10-CM | POA: Diagnosis not present

## 2014-07-11 DIAGNOSIS — M25569 Pain in unspecified knee: Secondary | ICD-10-CM | POA: Diagnosis not present

## 2014-07-11 DIAGNOSIS — R262 Difficulty in walking, not elsewhere classified: Secondary | ICD-10-CM | POA: Diagnosis not present

## 2014-07-12 ENCOUNTER — Ambulatory Visit (HOSPITAL_COMMUNITY)
Admission: RE | Admit: 2014-07-12 | Discharge: 2014-07-12 | Disposition: A | Discharge status: Home / Self Care | Payer: Medicare Other | Source: Ambulatory Visit | Attending: Neurology | Admitting: Neurology

## 2014-07-12 DIAGNOSIS — G319 Degenerative disease of nervous system, unspecified: Secondary | ICD-10-CM | POA: Insufficient documentation

## 2014-07-12 DIAGNOSIS — R569 Unspecified convulsions: Secondary | ICD-10-CM | POA: Diagnosis present

## 2014-07-12 DIAGNOSIS — R29818 Other symptoms and signs involving the nervous system: Secondary | ICD-10-CM | POA: Diagnosis not present

## 2014-07-12 LAB — CREATININE, SERUM
CREATININE: 0.86 mg/dL (ref 0.50–1.35)
GFR calc Af Amer: >90 mL/min (ref >90)
GFR calc non Af Amer: 85 mL/min — ABNORMAL LOW (ref >90)

## 2014-07-12 MED ORDER — GADOBENATE DIMEGLUMINE 529 MG/ML IV SOLN
15.0000 mL | Freq: Once | INTRAVENOUS | Status: AC | PRN
  Administered 2014-07-12: 15 mL via INTRAVENOUS

## 2014-07-12 NOTE — Procedures (Signed)
ELECTROENCEPHALOGRAM REPORT  Date of Study: 07/10/2014  Patient's Name: Tanner Griffin MRN: [id number] Date of Birth: 05-16-1941  Referring Provider: Dr. [name] Tat  Clinical History: This is a 73 year old man with nocturnal episodes of right arm jerking.  Medications: Norvasc, aspirin, labetalol, baclofen  Technical Summary: A multichannel digital EEG recording measured by the international 10-20 system with electrodes applied with paste and impedances below 5000 ohms performed in our laboratory with EKG monitoring in an awake and drowsy patient.  Hyperventilation and photic stimulation were performed.  The digital EEG was referentially recorded, reformatted, and digitally filtered in a variety of bipolar and referential montages for optimal display.  Spike detection software was employed.  Description: The patient is awake and drowsy during the recording.  During maximal wakefulness, there is a symmetric, medium voltage 9 Hz posterior dominant rhythm that attenuates with eye opening.  The record is symmetric.  During drowsiness, there is an increase in theta slowing of the background.  Hyperventilation and photic stimulation did not elicit any abnormalities.  There were no epileptiform discharges or electrographic seizures seen.    EKG lead showed irregular rhythm.  Impression: This awake and drowsy EEG is normal.    Clinical Correlation: A normal EEG does not exclude a clinical diagnosis of epilepsy. Episodes of right arm jerking were not captured.  If further clinical questions remain, prolonged EEG may be helpful.  Clinical correlation is advised.   Ellouise Newer, M.D.

## 2014-07-15 ENCOUNTER — Telehealth: Payer: Self-pay | Admitting: Neurology

## 2014-07-15 NOTE — Telephone Encounter (Signed)
Message copied by Annamaria Helling on Mon Jul 15, 2014  8:34 AM ------      Message from: TAT, Arpin: Mon Jul 15, 2014  7:49 AM       Reviewed.  Nl.  Please let pt know, [name].  Thx ------

## 2014-07-15 NOTE — Telephone Encounter (Signed)
LM for patient to call back.

## 2014-07-16 DIAGNOSIS — Z96659 Presence of unspecified artificial knee joint: Secondary | ICD-10-CM | POA: Diagnosis not present

## 2014-07-16 DIAGNOSIS — R262 Difficulty in walking, not elsewhere classified: Secondary | ICD-10-CM | POA: Diagnosis not present

## 2014-07-16 DIAGNOSIS — M25569 Pain in unspecified knee: Secondary | ICD-10-CM | POA: Diagnosis not present

## 2014-07-16 DIAGNOSIS — M25669 Stiffness of unspecified knee, not elsewhere classified: Secondary | ICD-10-CM | POA: Diagnosis not present

## 2014-07-17 DIAGNOSIS — M25569 Pain in unspecified knee: Secondary | ICD-10-CM | POA: Diagnosis not present

## 2014-07-18 ENCOUNTER — Other Ambulatory Visit: Payer: Self-pay | Admitting: Internal Medicine

## 2014-07-18 DIAGNOSIS — R262 Difficulty in walking, not elsewhere classified: Secondary | ICD-10-CM | POA: Diagnosis not present

## 2014-07-18 DIAGNOSIS — Z96659 Presence of unspecified artificial knee joint: Secondary | ICD-10-CM | POA: Diagnosis not present

## 2014-07-18 DIAGNOSIS — M25569 Pain in unspecified knee: Secondary | ICD-10-CM | POA: Diagnosis not present

## 2014-07-18 DIAGNOSIS — M25669 Stiffness of unspecified knee, not elsewhere classified: Secondary | ICD-10-CM | POA: Diagnosis not present

## 2014-07-19 ENCOUNTER — Other Ambulatory Visit: Payer: Self-pay | Admitting: Internal Medicine

## 2014-07-19 DIAGNOSIS — E785 Hyperlipidemia, unspecified: Secondary | ICD-10-CM

## 2014-07-22 ENCOUNTER — Other Ambulatory Visit (INDEPENDENT_AMBULATORY_CARE_PROVIDER_SITE_OTHER): Payer: Medicare Other

## 2014-07-22 DIAGNOSIS — E785 Hyperlipidemia, unspecified: Secondary | ICD-10-CM | POA: Diagnosis not present

## 2014-07-22 LAB — LIPID PANEL
Cholesterol: 193 mg/dL (ref 0–200)
HDL: 46.3 mg/dL (ref >39.00)
LDL CALC: 126 mg/dL — AB (ref 0–99)
NonHDL: 146.7
Total CHOL/HDL Ratio: 4
Triglycerides: 103 mg/dL (ref 0.0–149.0)
VLDL: 20.6 mg/dL (ref 0.0–40.0)

## 2014-07-29 ENCOUNTER — Ambulatory Visit (INDEPENDENT_AMBULATORY_CARE_PROVIDER_SITE_OTHER): Payer: Medicare Other | Admitting: Neurology

## 2014-07-29 DIAGNOSIS — R569 Unspecified convulsions: Secondary | ICD-10-CM

## 2014-08-05 ENCOUNTER — Telehealth: Payer: Self-pay | Admitting: *Deleted

## 2014-08-05 ENCOUNTER — Telehealth: Payer: Self-pay | Admitting: Neurology

## 2014-08-05 ENCOUNTER — Other Ambulatory Visit: Payer: Self-pay | Admitting: *Deleted

## 2014-08-05 MED ORDER — ATORVASTATIN CALCIUM 40 MG PO TABS: 40.0000 mg | ORAL_TABLET | Freq: Every day | ORAL | Status: DC

## 2014-08-05 NOTE — Procedures (Signed)
ELECTROENCEPHALOGRAM REPORT  Dates of Recording: 07/29/2014 to 07/30/2014  Patient's Name: Tanner Griffin MRN: [id number] Date of Birth: 1941-10-17  Referring Provider: Dr. [name] Tat  Procedure: 24-hour ambulatory EEG  History: This is a 73 year old man with nocturnal episodes of right arm jerking.  Medications: Norvasc, aspirin, labetalol, baclofen  Technical Summary: This is a 24-hour multichannel digital EEG recording measured by the international 10-20 system with electrodes applied with paste and impedances below 5000 ohms performed as portable with EKG monitoring.  The digital EEG was referentially recorded, reformatted, and digitally filtered in a variety of bipolar and referential montages for optimal display.    DESCRIPTION OF RECORDING: During maximal wakefulness, the background activity consisted of a symmetric 9 Hz posterior dominant rhythm which was reactive to eye opening.  There were no epileptiform discharges or focal slowing seen in wakefulness.  During the recording, the patient progresses through wakefulness, drowsiness, and Stage 2 sleep.  Again, there were no epileptiform discharges seen.  Events: There were no push button events. Patient did not report any typical episodes on patient diary.  There were no electrographic seizures seen.  EKG lead showed irregular rhythm.  IMPRESSION: This 24-hour ambulatory EEG study is normal.    CLINICAL CORRELATION: A normal EEG does not exclude a clinical diagnosis of epilepsy. Typical events were not captured.  If further clinical questions remain, inpatient video EEG monitoring may be helpful.   Ellouise Newer, M.D.

## 2014-08-05 NOTE — Telephone Encounter (Signed)
Message copied by Annamaria Helling on Mon Aug 05, 2014 12:45 PM ------      Message from: TAT, Commodore      Created: Mon Aug 05, 2014 12:41 PM       Jade, let pt/wife know that EEG looked fine.  However, I sent note to PCP that the EKG lead on the EEG was irregular and he probably needs f/u with PCP in that regard. ------

## 2014-08-05 NOTE — Telephone Encounter (Signed)
Tried to call patient with no answer and eventual "fax machine" noise. Will try again later.

## 2014-08-05 NOTE — Telephone Encounter (Signed)
Not yet, given EKG abnormality.  Also, I don't think that he had any of the jerking spells on the EEG although you can ask him if he had any that night please.

## 2014-08-05 NOTE — Telephone Encounter (Signed)
Let's encourage these folks to try one of our providers who do not have full panels. I am sorry but I can not take on any new patients at this time.

## 2014-08-05 NOTE — Telephone Encounter (Signed)
Dr Carles Collet - since MR and EEG normal do you want me to order sleep study PSG for parasomnia?

## 2014-08-05 NOTE — Telephone Encounter (Signed)
Patient would like to switch to Dr Elease Hashimoto.  He stated he seen him in the past and really liked him.  Please advise

## 2014-08-06 NOTE — Telephone Encounter (Signed)
Spoke with patient's wife. She was made aware that their PCP would be contacting them about the abnormal EKG. They stated that he did have the jerking movements while he had the EEG on.

## 2014-08-06 NOTE — Telephone Encounter (Signed)
Routed to cheryl

## 2014-08-06 NOTE — Telephone Encounter (Signed)
Can you see if pt is okay with switching to Dr Yong Channel

## 2014-08-06 NOTE — Telephone Encounter (Signed)
That's actually good then.  It allows [place] to say pretty definitely that those weren't seizure.  Let Dr. Elease Hashimoto investigate the EKG abnormality and if that is okay, then we will send to sleep doc

## 2014-08-07 NOTE — Telephone Encounter (Signed)
Pt aware dr Elease Hashimoto will see him for this current issue only. Pt must est w/ dr Retail banker. Dr Elease Hashimoto is no longer accepting patients.

## 2014-08-09 ENCOUNTER — Ambulatory Visit (INDEPENDENT_AMBULATORY_CARE_PROVIDER_SITE_OTHER): Payer: Medicare Other | Admitting: Family Medicine

## 2014-08-09 ENCOUNTER — Encounter: Payer: Self-pay | Admitting: Family Medicine

## 2014-08-09 VITALS — BP 128/78 | HR 66 | Temp 98.5°F | Wt 176.0 lb

## 2014-08-09 DIAGNOSIS — I499 Cardiac arrhythmia, unspecified: Secondary | ICD-10-CM | POA: Diagnosis not present

## 2014-08-09 DIAGNOSIS — I498 Other specified cardiac arrhythmias: Secondary | ICD-10-CM

## 2014-08-09 NOTE — Progress Notes (Signed)
   Subjective:    Patient ID: Tanner Griffin, male    DOB: Oct 15, 1941, 73 y.o.   MRN: [id number]  HPI Patient is seen for followup regarding recent irregularity noted while obtaining EEG. There was questionable atrial fibrillation on monitor. He has no prior history of atrial fibrillation. He has not had any palpitations or dizziness or syncope. He's had previous EKGs which have not shown any significant arrhythmia. His chronic problems include history of type 2 diabetes, hypertension, hyperlipidemia, BPH. He is being evaluated by neurology for involuntary upper extremity jerking. EEG apparently did not show any seizure activity.  Past Medical History  Diagnosis Date  . HYPERLIPIDEMIA     takes Lipitor daily  . dm 2     pt denies dm  . Diabetes mellitus     not taking any med  . HYPERTENSION     takes Amlodipine and Labetalol daily  . Constipation     takes Miralax daily as needed  . Pneumonia     hx of-as a child  . Arthritis   . Joint pain   . Joint swelling   . Urinary frequency     takes Flomax daily  . Nocturia   . Arthrofibrosis of total knee arthroplasty, right 03/26/2014  . Osteoarthritis of left knee 03/26/2014   Past Surgical History  Procedure Laterality Date  . Total knee arthroplasty  2011    right  . Rt knee total replacement    . Colonoscopy    . Total knee arthroplasty Left 03/26/2014    DR LANDAU  . Total knee arthroplasty Left 03/26/2014    Procedure: TOTAL KNEE ARTHROPLASTY;  Surgeon: [name], MD;  Location: [place];  Service: Orthopedics;  Laterality: Left;  . Knee closed reduction Right 03/26/2014    Procedure: CLOSED MANIPULATION KNEE;  Surgeon: [name], MD;  Location: [place];  Service: Orthopedics;  Laterality: Right;    reports that he has never smoked. He has never used smokeless tobacco. He reports that he does not drink alcohol or use illicit drugs. family history is negative for Colon cancer, Stomach cancer, Esophageal cancer, and Rectal  cancer. Allergies  Allergen Reactions  . Telmisartan Other (See Comments)    REACTION: headache      Review of Systems  Constitutional: Negative for fever, chills, appetite change and unexpected weight change.  Respiratory: Negative for cough and shortness of breath.   Cardiovascular: Negative for chest pain, palpitations and leg swelling.  Neurological: Negative for dizziness, syncope and weakness.       Objective:   Physical Exam  Constitutional: He appears well-developed and well-nourished.  Neck: Neck supple. No thyromegaly present.  Cardiovascular: Normal rate and regular rhythm.  Exam reveals no gallop.   Pulmonary/Chest: Effort normal and breath sounds normal. No respiratory distress. He has no wheezes. He has no rales.  Musculoskeletal: He exhibits no edema.          Assessment & Plan:  Recent question of arrhythmia on EEG. Relatively asymptomatic with regard any cardiac symptoms. EKG today shows mild sinus bradycardia with rate around 56 with no evidence for atrial fibrillation. We did explain to patient that sometimes people can go into intermittent A. fib. We'll set up event monitor and if normal no further evaluation

## 2014-08-09 NOTE — Patient Instructions (Signed)
We will call you with heart event monitor.

## 2014-08-09 NOTE — Progress Notes (Signed)
Pre visit review using our clinic review tool, if applicable. No additional management support is needed unless otherwise documented below in the visit note. 

## 2014-08-14 ENCOUNTER — Encounter (INDEPENDENT_AMBULATORY_CARE_PROVIDER_SITE_OTHER): Payer: Medicare Other

## 2014-08-14 ENCOUNTER — Encounter: Payer: Self-pay | Admitting: *Deleted

## 2014-08-14 DIAGNOSIS — I498 Other specified cardiac arrhythmias: Secondary | ICD-10-CM | POA: Diagnosis not present

## 2014-08-14 NOTE — Progress Notes (Signed)
Patient ID: Tanner Griffin, male   DOB: 1941/11/05, 73 y.o.   MRN: [id number] E-Cardio verite 30 day cardiac event monitor applied to patient.

## 2014-09-02 ENCOUNTER — Telehealth: Payer: Self-pay | Admitting: *Deleted

## 2014-09-02 NOTE — Telephone Encounter (Signed)
[facility] report received:  09/02/14 9:44 am NSR, HR 74, patient activated symptom of passing out.  Reached patient's wife after several failed attempts to reach patient. She begins by apologizing several times - the machine started beeping, they were trying to fix it/put it on right and pt was just pushing buttons trying to get noise to stop. Patient did not pass out, no symptoms, feeling fine.

## 2014-09-23 ENCOUNTER — Telehealth: Payer: Self-pay

## 2014-09-23 NOTE — Telephone Encounter (Signed)
Would not recommend any further cardiac evaluation unless any new symptoms.

## 2014-09-23 NOTE — Telephone Encounter (Signed)
Called and spoke with pt about lab results and pt would like to know what the next step is.  Pt cannot establish with Dr. Yong Channel until 2.1.16.  Pls advise.

## 2014-09-24 NOTE — Telephone Encounter (Signed)
Called and spoke with pt and pt is aware.  

## 2014-09-25 ENCOUNTER — Ambulatory Visit (INDEPENDENT_AMBULATORY_CARE_PROVIDER_SITE_OTHER): Payer: Medicare Other

## 2014-09-25 DIAGNOSIS — M545 Low back pain: Secondary | ICD-10-CM | POA: Diagnosis not present

## 2014-09-25 DIAGNOSIS — Z23 Encounter for immunization: Secondary | ICD-10-CM

## 2014-10-21 ENCOUNTER — Other Ambulatory Visit: Payer: Self-pay

## 2014-10-21 MED ORDER — TAMSULOSIN HCL 0.4 MG PO CAPS: ORAL_CAPSULE | ORAL | Status: DC

## 2014-10-21 NOTE — Telephone Encounter (Signed)
Rx request for Tamsulosin 0.4 mg capsule.  Rx last filled on 7.31.2015. Pt last seen 8.21.15.  Rx sent to pharmacy.

## 2014-10-23 DIAGNOSIS — M545 Low back pain: Secondary | ICD-10-CM | POA: Diagnosis not present

## 2014-10-24 ENCOUNTER — Ambulatory Visit (INDEPENDENT_AMBULATORY_CARE_PROVIDER_SITE_OTHER): Payer: Medicare Other | Admitting: Neurology

## 2014-10-24 ENCOUNTER — Encounter: Payer: Self-pay | Admitting: Neurology

## 2014-10-24 VITALS — BP 122/60 | HR 60 | Ht 68.0 in | Wt 179.0 lb

## 2014-10-24 DIAGNOSIS — G4750 Parasomnia, unspecified: Secondary | ICD-10-CM | POA: Diagnosis not present

## 2014-10-24 NOTE — Progress Notes (Signed)
Tanner Griffin was seen today in neurologic consultation at the request of Dr. Elease Hashimoto. The consultation is for the evaluation of R arm jerking.  This patient is accompanied in the office by his spouse who supplements the history.  The records that were made available to me were reviewed.  Sx's never occur during the day.  It happens nearly every single night.  It starts 2.5 - 3 hours into sleep.  He had a L TKA and sx's started about 4 weeks after that.  The sx's consist of the R arm violently jerking rhythmically per the patient but his wife states that it may jerk once and then she will count to 20 seconds and it will happen again.  She is unsure how long it will happen.  The patient states that he may be aware of it if his wife awakens him but he cannot control it.  He will try to put the hand under the head and it will continue the shake.  About the last week, he noted that he had similar sx's in the L arm.  He takes a nap between 2-3pm and it doesn't happen then.  He has no hx of seizure.  Only new meds are baclofen and percocet since knee surgery.  No loss or alteration of consciousness even if awakened during the event.  Arm feels normal while jerking.  10/24/14 update:  Pt returns today, accompanied by wife who supplements the hx.  Pt had MRI of the brain since last visit without and without gad that was normal.  He had a normal ambulatory EEG that was normal, despite the fact that he had typical jerking of the arm during that night sleep.  He continues to have nightly events.  He thinks that when he puts arthritis cream on the arms that the jerking is better.    Neuroimaging has not previously been performed.     ALLERGIES:   Allergies  Allergen Reactions  . Telmisartan Other (See Comments)    REACTION: headache    CURRENT MEDICATIONS:  Current Outpatient Prescriptions on File Prior to Visit  Medication Sig Dispense Refill  . amLODipine (NORVASC) 10 MG tablet TAKE 1 TABLET BY MOUTH  EVERY DAY 90 tablet 0  . aspirin EC 81 MG tablet Take 81 mg by mouth daily.    [name] atorvastatin (LIPITOR) 40 MG tablet Take 1 tablet (40 mg total) by mouth daily. 90 tablet 3  . baclofen (LIORESAL) 10 MG tablet Take 1 tablet (10 mg total) by mouth 3 (three) times daily. As needed for muscle spasm 50 each 0  . labetalol (NORMODYNE) 200 MG tablet TAKE 1 TABLET BY MOUTH TWICE DAILY 60 tablet 5  . Multiple Vitamin (MULTIVITAMIN) tablet Take 1 tablet by mouth daily.    . polyethylene glycol powder (GLYCOLAX/MIRALAX) powder MIX 17 GRAMS INTO THE LIQUID AND DRINK TWICE DAILY AS NEEDED 527 g 0  . tamsulosin (FLOMAX) 0.4 MG CAPS capsule TAKE 1 CAPSULE BY MOUTH DAILY 90 capsule 1   No current facility-administered medications on file prior to visit.    PAST MEDICAL HISTORY:   Past Medical History  Diagnosis Date  . HYPERLIPIDEMIA     takes Lipitor daily  . dm 2     pt denies dm  . Diabetes mellitus     not taking any med  . HYPERTENSION     takes Amlodipine and Labetalol daily  . Constipation     takes Miralax daily as needed  .  Pneumonia     hx of-as a child  . Arthritis   . Joint pain   . Joint swelling   . Urinary frequency     takes Flomax daily  . Nocturia   . Arthrofibrosis of total knee arthroplasty, right 03/26/2014  . Osteoarthritis of left knee 03/26/2014    PAST SURGICAL HISTORY:   Past Surgical History  Procedure Laterality Date  . Total knee arthroplasty  2011    right  . Rt knee total replacement    . Colonoscopy    . Total knee arthroplasty Left 03/26/2014    DR LANDAU  . Total knee arthroplasty Left 03/26/2014    Procedure: TOTAL KNEE ARTHROPLASTY;  Surgeon: [name], MD;  Location: [facility];  Service: Orthopedics;  Laterality: Left;  . Knee closed reduction Right 03/26/2014    Procedure: CLOSED MANIPULATION KNEE;  Surgeon: [name], MD;  Location: [facility];  Service: Orthopedics;  Laterality: Right;    SOCIAL HISTORY:   History   Social History  . Marital  Status: Married    Spouse Name: N/A    Number of Children: N/A  . Years of Education: N/A   Occupational History  . retired     Architect   Social History Main Topics  . Smoking status: Never Smoker   . Smokeless tobacco: Never Used  . Alcohol Use: No  . Drug Use: No  . Sexual Activity: Yes   Other Topics Concern  . Not on file   Social History Narrative    FAMILY HISTORY:   Family Status  Relation Status Death Age  . Mother Deceased     unknown  . Father Deceased     brain cancer  . Sister Alive   . Sister Alive   . Sister Alive   . Sister Alive   . Sister Alive   . Brother Alive     kidney failure  . Brother Alive   . Brother Alive   . Brother Alive     ROS:  A complete 10 system review of systems was obtained and was unremarkable apart from what is mentioned above.  PHYSICAL EXAMINATION:    VITALS:   Filed Vitals:   10/24/14 1046  BP: 122/60  Pulse: 60  Height: 5\' 8"  (1.727 m)  Weight: 179 lb (81.194 kg)    GEN:  Normal appears male in no acute distress.  Appears stated age. HEENT:  Normocephalic, atraumatic. The mucous membranes are moist. The superficial temporal arteries are without ropiness or tenderness.   NEUROLOGICAL: Orientation:  The patient is alert and oriented x 3.  Fund of knowledge is appropriate.  Recent and remote memory intact.  Attention span and concentration normal.  Repeats and names without difficulty. Cranial nerves: There is good facial symmetry. Speech is fluent and clear. Soft palate rises symmetrically and there is no tongue deviation. Hearing is intact to conversational tone. Tone: Tone is good throughout. Sensation: Sensation is intact to light touch throughout. Coordination:  The patient has no difficulty with RAM's or FNF bilaterally. Motor: Strength is 5/5 in the bilateral upper and right lower extremities.  Strength is 5/5 in the proximal left lower extremity, but he has pain on knee extension and knee flexion, so  I was unable to evaluate formal manual motor testing.  Strength, therefore distally he can only be quantified as at least 3/5. Shoulder shrug is equal and symmetric. There is no pronator drift.  There are no fasciculations noted. DTR's:  Deep tendon reflexes are 2/4 at the bilateral biceps, triceps, brachioradialis, patella and trace at the bilateral achilles.  Plantar responses are downgoing bilaterally.   LABS  Lab Results  Component Value Date   HGBA1C 5.8 11/30/2013     Chemistry      Component Value Date/Time   NA 143 03/27/2014 0830   K 4.3 03/27/2014 0830   CL 107 03/27/2014 0830   CO2 23 03/27/2014 0830   BUN 22 03/27/2014 0830   CREATININE 0.86 07/12/2014 0647      Component Value Date/Time   CALCIUM 9.2 03/27/2014 0830   ALKPHOS 150* 11/30/2013 0903   AST 18 11/30/2013 0903   ALT 17 11/30/2013 0903   BILITOT 0.8 11/30/2013 0903        IMPRESSION/PLAN  1. nocturnal spells, likely representing parasomnia  -refer to [facility] for sleep evaluation as remainder of neuro evaluation/workup was negative  -f/u prn

## 2014-10-24 NOTE — Patient Instructions (Signed)
1. We have you scheduled with Dr Antony Haste on Tuesday 10/29/14 at 9:30 am to arrive at 9:00 am. They are located at [facility] in [place]. If this is not a good date/time please call [phone] to reschedule.

## 2014-10-29 ENCOUNTER — Encounter: Payer: Self-pay | Admitting: Neurology

## 2014-10-29 ENCOUNTER — Ambulatory Visit (INDEPENDENT_AMBULATORY_CARE_PROVIDER_SITE_OTHER): Payer: Medicare Other | Admitting: Neurology

## 2014-10-29 VITALS — BP 152/88 | HR 72 | Temp 98.6°F | Ht 67.5 in | Wt 179.0 lb

## 2014-10-29 DIAGNOSIS — G253 Myoclonus: Secondary | ICD-10-CM

## 2014-10-29 DIAGNOSIS — Z96651 Presence of right artificial knee joint: Secondary | ICD-10-CM | POA: Diagnosis not present

## 2014-10-29 DIAGNOSIS — Z96652 Presence of left artificial knee joint: Secondary | ICD-10-CM

## 2014-10-29 NOTE — Patient Instructions (Addendum)
Your current neurological exam and extensive work up does not suggest any sinister underlying disease process. You may have had involuntary twitching from your pain medication which you have since then stopped. I think we can wait and see if you continue to improve. Call me back by the end of November to report. If you have residual twitching, we can proceed with a sleep study then. I believe we can wait it out some more.   I can see you back as needed.

## 2014-10-29 NOTE — Progress Notes (Signed)
Subjective:    Patient ID: Tanner Griffin is a 73 y.o. male.  HPI     Star Age, MD, PhD [facility] [address] [place], [place] [address]  Dear [name],  I saw your patient, Tanner Griffin, upon your kind request in my clinic today for initial consultation of his parasomnia. The patient is accompanied by his wife today. As you know, [name] is a 73 year old right-handed gentleman with an underlying medical history of hypertension, hyperlipidemia, type 2 diabetes, arthritis, nocturia, pneumonia as a child, status post right total knee arthroplasty in 2011, status post left total knee arthroplasty in April 2015, who reports an approximately six-month history of involuntary arm movements of the right upper extremity > LUE during sleep. Symptoms are described as intermittent jerking of the right arm, rarely of the L while he is asleep. Symptoms rarely occur during the day and he has no control. Workup with 24 hour ambulatory EEG was normal. I reviewed his EEG report from 07/10/2014: This awake and drowsy EEG is normal. He had a brain MRI with and without contrast on 07/12/2014: Normal examination for a person of this age. Mild age related atrophy. No focal finding. No lesions seen to explain the presenting symptoms.  In addition, personally reviewed the images through the PACS system. He has never had a sleep study. His ESS is 11/24 today. He has gradually improved. His wife notes that in the beginning his involuntary movements were large and flailing. Now he has very intermittent minor movements. His 34-year-old grandson sleeps in the same bed with him and does not seem to be bothered by involuntary movements. Of note, he was on narcotic pain medication after his left knee replacement surgery and his wife states that he was on it for a prolonged period of time and actually stopped just a month ago altogether. She recalls it was hydrocodone and he was  taking it twice daily, last dose before bedtime. He wonders if he took too much. He snores some, mildly, no apneas are reported. He is not sure if he had his tonsils taken out. He denies restless leg symptoms. He is not known to twitch his legs in his sleep. He never had a similar problem before his left knee replacement surgery. He reports residual right knee pain.   His Past Medical History Is Significant For: Past Medical History  Diagnosis Date  . HYPERLIPIDEMIA     takes Lipitor daily  . dm 2     pt denies dm  . Diabetes mellitus     not taking any med  . HYPERTENSION     takes Amlodipine and Labetalol daily  . Constipation     takes Miralax daily as needed  . Pneumonia     hx of-as a child  . Arthritis   . Joint pain   . Joint swelling   . Urinary frequency     takes Flomax daily  . Nocturia   . Arthrofibrosis of total knee arthroplasty, right 03/26/2014  . Osteoarthritis of left knee 03/26/2014    His Past Surgical History Is Significant For: Past Surgical History  Procedure Laterality Date  . Total knee arthroplasty  2011    right  . Rt knee total replacement Bilateral 2014  . Colonoscopy    . Total knee arthroplasty Left 03/26/2014    DR LANDAU  . Total knee arthroplasty Left 03/26/2014    Procedure: TOTAL KNEE ARTHROPLASTY;  Surgeon: [name],  MD;  Location: [facility];  Service: Orthopedics;  Laterality: Left;  . Knee closed reduction Right 03/26/2014    Procedure: CLOSED MANIPULATION KNEE;  Surgeon: [name], MD;  Location: [facility];  Service: Orthopedics;  Laterality: Right;    His Family History Is Significant For: Family History  Problem Relation Age of Onset  . Colon cancer Neg Hx   . Stomach cancer Neg Hx   . Esophageal cancer Neg Hx   . Rectal cancer Neg Hx   . Brain cancer Father     His Social History Is Significant For: History   Social History  . Marital Status: Married    Spouse Name: Tanner Griffin    Number of Children: 5  . Years of  Education: 9th   Occupational History  . retired     Architect   Social History Main Topics  . Smoking status: Never Smoker   . Smokeless tobacco: Never Used  . Alcohol Use: No  . Drug Use: No  . Sexual Activity: Yes   Other Topics Concern  . None   Social History Narrative   Patient consumes occas. caffeine     His Allergies Are:  Allergies  Allergen Reactions  . Telmisartan Other (See Comments)    REACTION: headache  :   His Current Medications Are:  Outpatient Encounter Prescriptions as of 10/29/2014  Medication Sig  . amLODipine (NORVASC) 10 MG tablet TAKE 1 TABLET BY MOUTH EVERY DAY  . aspirin EC 81 MG tablet Take 81 mg by mouth daily.  [name] atorvastatin (LIPITOR) 40 MG tablet Take 1 tablet (40 mg total) by mouth daily.  . baclofen (LIORESAL) 10 MG tablet Take 1 tablet (10 mg total) by mouth 3 (three) times daily. As needed for muscle spasm  . labetalol (NORMODYNE) 200 MG tablet TAKE 1 TABLET BY MOUTH TWICE DAILY  . Multiple Vitamin (MULTIVITAMIN) tablet Take 1 tablet by mouth daily.  . polyethylene glycol powder (GLYCOLAX/MIRALAX) powder MIX 17 GRAMS INTO THE LIQUID AND DRINK TWICE DAILY AS NEEDED  . tamsulosin (FLOMAX) 0.4 MG CAPS capsule TAKE 1 CAPSULE BY MOUTH DAILY  :   Review of Systems:  Out of a complete 14 point review of systems, all are reviewed and negative with the exception of these symptoms as listed below:   Review of Systems  Neurological:       Restless arm (both arms, mostly right)    Objective:  Neurologic Exam  Physical Exam Physical Examination:   Filed Vitals:   10/29/14 0934  BP: 152/88  Pulse: 72  Temp: 98.6 F (37 C)    General Examination: The patient is a very pleasant 73 y.o. male in no acute distress. He appears well-developed and well-nourished and well groomed.   HEENT: Normocephalic, atraumatic, pupils are equal, round and reactive to light and accommodation. Funduscopic exam is normal with sharp disc margins  noted. Extraocular tracking is good without limitation to gaze excursion or nystagmus noted. Normal smooth pursuit is noted. Hearing is grossly intact. Tympanic membranes are clear bilaterally. Face is symmetric with normal facial animation and normal facial sensation. Speech is clear with no dysarthria noted. There is no hypophonia. There is no lip, neck/head, jaw or voice tremor. Neck is supple with full range of passive and active motion. There are no carotid bruits on auscultation. Oropharynx exam reveals: moderate mouth dryness, adequate dental hygiene and mild airway crowding, due to larger tongue, uvula is thin and wispy-like and tonsils appear absent or very small.  Mallampati is class II.   Chest: Clear to auscultation without wheezing, rhonchi or crackles noted.  Heart: S1+S2+0, regular and normal without murmurs, rubs or gallops noted.   Abdomen: Soft, non-tender and non-distended with normal bowel sounds appreciated on auscultation.  Extremities: There is no pitting edema in the distal lower extremities bilaterally. Pedal pulses are intact.  Skin: Warm and dry without trophic changes noted. There are no varicose veins.  Musculoskeletal: exam reveals no obvious joint deformities, tenderness or joint swelling or erythema, with the exception of right knee pain which has been ongoing for 2 years since his replacement surgery. He also has decrease in range of motion in the right leg. He reports mild left hip pain as well.   Neurologically:  Mental status: The patient is awake, alert and oriented in all 4 spheres. His immediate and remote memory, attention, language skills and fund of knowledge are appropriate. There is no evidence of aphasia, agnosia, apraxia or anomia. Speech is clear with normal prosody and enunciation. Thought process is linear. Mood is normal and affect is normal.  Cranial nerves II - XII are as described above under HEENT exam. In addition: shoulder shrug is normal with  equal shoulder height noted. Motor exam: Normal bulk, strength and tone is noted. There is no drift, tremor or rebound. I did not detect any myoclonus, no fasciculations, no atrophy, and no dystonic posturing. He does have a hard time relaxing. Romberg is negative for corrective steps but he does have a mild sway. Reflexes are 2+ in the upper extremities and trace in both knees, absent in both ankles. Babinski: Toes are flexor bilaterally. Fine motor skills and coordination: intact with normal finger taps, normal hand movements, normal rapid alternating patting, normal foot taps and normal foot agility.  Cerebellar testing: No dysmetria or intention tremor on finger to nose testing. Heel to shin is difficult bilaterally secondary to decrease in range of motion in the knees and left hip. There is no truncal or gait ataxia.  Sensory exam: intact to light touch, pinprick, vibration, temperature sense in the upper and lower extremities.  Gait, station and balance: He stands with difficulty. No veering to one side is noted. No leaning to one side is noted. Posture is age-appropriate and stance is narrow based. Gait shows: he walks with a limp on the right. He walks slowly. He brought a cane. He can walk some without his cane and also reports mild left hip pain and residual right knee pain when walking. Tandem walk was not tested.  Assessment and Plan:   In summary, YONG GRIESER is a very pleasant 73 y.o.-year old male with an underlying medical history of hypertension, hyperlipidemia, type 2 diabetes, arthritis, nocturia, pneumonia as a child, status post right total knee arthroplasty in 2011, status post left total knee arthroplasty in April 2015, who reports an approximately six-month history of involuntary arm movements of the right upper extremity > LUE during sleep. Symptoms recently have gradually improved. His history is most consistent with myoclonus, which could've been secondary to narcotic pain  medication. Since he has improved I think we can wait it out a little longer. He has had extensive workup recently and his neurological exam is largely nonfocal. I suggested that give me an update via phone or email through my chart in about 3-4 weeks and we can pursue a sleep study if he has not improved further. We can certainly look for myoclonus during sleep. He does not endorse RLS symptoms  and his description is not typical for PLMD. Of note, he never had similar symptoms before his knee replacement surgery. I explained my findings to the patient and his wife and they were in agreement. I answered all their questions today and the patient Or his wife will call me or email me if by the end of this month or early next month and we can take it from there.   Thank you very much for allowing me to participate in the care of this nice patient. If I can be of any further assistance to you please do not hesitate to call me at [phone].  Sincerely,   Star Age, MD, PhD

## 2014-11-02 DIAGNOSIS — M47816 Spondylosis without myelopathy or radiculopathy, lumbar region: Secondary | ICD-10-CM | POA: Diagnosis not present

## 2014-11-04 DIAGNOSIS — M545 Low back pain: Secondary | ICD-10-CM | POA: Diagnosis not present

## 2014-11-08 ENCOUNTER — Other Ambulatory Visit: Payer: Self-pay | Admitting: Internal Medicine

## 2014-11-16 ENCOUNTER — Other Ambulatory Visit: Payer: Self-pay | Admitting: Internal Medicine

## 2014-11-28 ENCOUNTER — Telehealth: Payer: Self-pay | Admitting: Internal Medicine

## 2014-11-28 NOTE — Telephone Encounter (Signed)
Pt's wife calling to report they received an automated call approximately 5 minutes ago asking if patient has had his annual diabetic eye exam.  Pt's wife states the patient has never been diagnosed with diabetes and therefore would have not reason to have one.  Please review as DM 2 is listed on patient's problem list??

## 2014-11-29 NOTE — Telephone Encounter (Signed)
Dr Leanne Chang, DM2 is on his problem list and you have put it as a primary dx on office visits.  He has had a hbga1c also however, he is not taking any medications for diabetes.  Can review and let me know?  Thanks

## 2014-12-24 ENCOUNTER — Encounter: Payer: Self-pay | Admitting: Family Medicine

## 2014-12-24 ENCOUNTER — Ambulatory Visit: Payer: Medicare Other | Admitting: Family Medicine

## 2014-12-24 ENCOUNTER — Ambulatory Visit (INDEPENDENT_AMBULATORY_CARE_PROVIDER_SITE_OTHER): Payer: Medicare Other | Admitting: Family Medicine

## 2014-12-24 VITALS — BP 128/78 | HR 66 | Temp 98.1°F | Ht 67.5 in | Wt 185.6 lb

## 2014-12-24 DIAGNOSIS — J069 Acute upper respiratory infection, unspecified: Secondary | ICD-10-CM

## 2014-12-24 MED ORDER — BENZONATATE 100 MG PO CAPS: 100.0000 mg | ORAL_CAPSULE | Freq: Two times a day (BID) | ORAL | Status: DC | PRN

## 2014-12-24 NOTE — Progress Notes (Signed)
HPI:  URI: -started: 4 days ago -symptoms:nasal congestion, sore throat, cough -denies: fever, SOB, NVD, tooth pain -has tried: honey -sick contacts/travel/risks: denies flu exposure, tick exposure or or Ebola risks - wife is sick with the same -Hx of: allergies ROS: See pertinent positives and negatives per HPI.  Past Medical History  Diagnosis Date  . HYPERLIPIDEMIA     takes Lipitor daily  . dm 2     pt denies dm  . Diabetes mellitus     not taking any med  . HYPERTENSION     takes Amlodipine and Labetalol daily  . Constipation     takes Miralax daily as needed  . Pneumonia     hx of-as a child  . Arthritis   . Joint pain   . Joint swelling   . Urinary frequency     takes Flomax daily  . Nocturia   . Arthrofibrosis of total knee arthroplasty, right 03/26/2014  . Osteoarthritis of left knee 03/26/2014    Past Surgical History  Procedure Laterality Date  . Total knee arthroplasty  2011    right  . Rt knee total replacement Bilateral 2014  . Colonoscopy    . Total knee arthroplasty Left 03/26/2014    DR LANDAU  . Total knee arthroplasty Left 03/26/2014    Procedure: TOTAL KNEE ARTHROPLASTY;  Surgeon: [name], MD;  Location: [facility];  Service: Orthopedics;  Laterality: Left;  . Knee closed reduction Right 03/26/2014    Procedure: CLOSED MANIPULATION KNEE;  Surgeon: [name], MD;  Location: [facility];  Service: Orthopedics;  Laterality: Right;    Family History  Problem Relation Age of Onset  . Colon cancer Neg Hx   . Stomach cancer Neg Hx   . Esophageal cancer Neg Hx   . Rectal cancer Neg Hx   . Brain cancer Father     History   Social History  . Marital Status: Married    Spouse Name: [name]    Number of Children: 5  . Years of Education: 9th   Occupational History  . retired     Architect   Social History Main Topics  . Smoking status: Never Smoker   . Smokeless tobacco: Never Used  . Alcohol Use: No  . Drug Use: No  . Sexual Activity:  Yes   Other Topics Concern  . None   Social History Narrative   Patient consumes occas. caffeine     Current outpatient prescriptions: amLODipine (NORVASC) 10 MG tablet, TAKE 1 TABLET BY MOUTH EVERY DAY, Disp: 90 tablet, Rfl: 0;  aspirin EC 81 MG tablet, Take 81 mg by mouth daily., Disp: , Rfl: ;  atorvastatin (LIPITOR) 40 MG tablet, Take 1 tablet (40 mg total) by mouth daily., Disp: 90 tablet, Rfl: 3;  baclofen (LIORESAL) 10 MG tablet, Take 1 tablet (10 mg total) by mouth 3 (three) times daily. As needed for muscle spasm, Disp: 50 each, Rfl: 0 labetalol (NORMODYNE) 200 MG tablet, TAKE 1 TABLET BY MOUTH TWICE DAILY, Disp: 60 tablet, Rfl: 5;  Multiple Vitamin (MULTIVITAMIN) tablet, Take 1 tablet by mouth daily., Disp: , Rfl: ;  polyethylene glycol powder (GLYCOLAX/MIRALAX) powder, MIX 17 GRAMS INTO WATER OR JUICE AND DRINK TWICE DAILY AS NEEDED, Disp: 527 g, Rfl: 0;  tamsulosin (FLOMAX) 0.4 MG CAPS capsule, TAKE 1 CAPSULE BY MOUTH DAILY, Disp: 90 capsule, Rfl: 1 benzonatate (TESSALON) 100 MG capsule, Take 1 capsule (100 mg total) by mouth 2 (two) times daily as  needed for cough., Disp: 20 capsule, Rfl: 0  EXAM:  Filed Vitals:   12/24/14 0932  BP: 128/78  Pulse: 66  Temp: 98.1 F (36.7 C)    Body mass index is 28.62 kg/(m^2).  GENERAL: vitals reviewed and listed above, alert, oriented, appears well hydrated and in no acute distress  HEENT: atraumatic, conjunttiva clear, no obvious abnormalities on inspection of external nose and ears, normal appearance of ear canals and TMs, clear nasal congestion, mild post oropharyngeal erythema with PND, no tonsillar edema or exudate, no sinus TTP  NECK: no obvious masses on inspection  LUNGS: clear to auscultation bilaterally, no wheezes, rales or rhonchi, good air movement  CV: HRRR, no peripheral edema  MS: moves all extremities without noticeable abnormality  PSYCH: pleasant and cooperative, no obvious depression or anxiety  ASSESSMENT AND  PLAN:  Discussed the following assessment and plan:  Acute upper respiratory infection - Plan: benzonatate (TESSALON) 100 MG capsule  -given HPI and exam findings today, a serious infection or illness is unlikely. We discussed potential etiologies, with VURI being most likely, and advised supportive care and monitoring. We discussed treatment side effects, likely course, antibiotic misuse, transmission, and signs of developing a serious illness. -of course, we advised to return or notify a doctor immediately if symptoms worsen or persist or new concerns arise.    Patient Instructions  INSTRUCTIONS FOR UPPER RESPIRATORY INFECTION:  -plenty of rest and fluids  -nasal saline wash 2-3 times daily (use prepackaged nasal saline or bottled/distilled water if making your own)   -can use AFRIN nasal spray for drainage and nasal congestion - but do NOT use longer then 3-4 days  -can use tylenol or ibuprofen as directed for aches and sorethroat  -in the winter time, using a humidifier at night is helpful (please follow cleaning instructions)  -if you are taking a cough medication - use only as directed, may also try a teaspoon of honey to coat the throat and throat lozenges  -for sore throat, salt water gargles can help  -follow up if you have fevers, facial pain, tooth pain, difficulty breathing or are worsening or not getting better in 5-7 days      Zanden Colver R.

## 2014-12-24 NOTE — Patient Instructions (Signed)

## 2014-12-24 NOTE — Progress Notes (Signed)
Pre visit review using our clinic review tool, if applicable. No additional management support is needed unless otherwise documented below in the visit note. 

## 2015-01-15 DIAGNOSIS — M25561 Pain in right knee: Secondary | ICD-10-CM | POA: Diagnosis not present

## 2015-01-20 ENCOUNTER — Ambulatory Visit (INDEPENDENT_AMBULATORY_CARE_PROVIDER_SITE_OTHER): Payer: Medicare Other | Admitting: Family Medicine

## 2015-01-20 ENCOUNTER — Encounter: Payer: Self-pay | Admitting: Family Medicine

## 2015-01-20 VITALS — BP 92/60 | Temp 98.3°F | Wt 184.0 lb

## 2015-01-20 DIAGNOSIS — I10 Essential (primary) hypertension: Secondary | ICD-10-CM | POA: Diagnosis not present

## 2015-01-20 DIAGNOSIS — E1159 Type 2 diabetes mellitus with other circulatory complications: Secondary | ICD-10-CM

## 2015-01-20 DIAGNOSIS — L84 Corns and callosities: Secondary | ICD-10-CM

## 2015-01-20 DIAGNOSIS — N138 Other obstructive and reflux uropathy: Secondary | ICD-10-CM

## 2015-01-20 DIAGNOSIS — N401 Enlarged prostate with lower urinary tract symptoms: Secondary | ICD-10-CM | POA: Diagnosis not present

## 2015-01-20 DIAGNOSIS — R739 Hyperglycemia, unspecified: Secondary | ICD-10-CM | POA: Diagnosis not present

## 2015-01-20 DIAGNOSIS — E785 Hyperlipidemia, unspecified: Secondary | ICD-10-CM | POA: Diagnosis not present

## 2015-01-20 DIAGNOSIS — E1151 Type 2 diabetes mellitus with diabetic peripheral angiopathy without gangrene: Secondary | ICD-10-CM

## 2015-01-20 DIAGNOSIS — K5909 Other constipation: Secondary | ICD-10-CM | POA: Insufficient documentation

## 2015-01-20 DIAGNOSIS — K5900 Constipation, unspecified: Secondary | ICD-10-CM | POA: Insufficient documentation

## 2015-01-20 LAB — LIPID PANEL
Cholesterol: 143 mg/dL (ref 0–200)
HDL: 49.1 mg/dL (ref >39.00)
LDL Cholesterol: 76 mg/dL (ref 0–99)
NonHDL: 93.9
TRIGLYCERIDES: 88 mg/dL (ref 0.0–149.0)
Total CHOL/HDL Ratio: 3
VLDL: 17.6 mg/dL (ref 0.0–40.0)

## 2015-01-20 LAB — CBC
HEMATOCRIT: 38.1 % — AB (ref 39.0–52.0)
Hemoglobin: 12.9 g/dL — ABNORMAL LOW (ref 13.0–17.0)
MCHC: 33.8 g/dL (ref 30.0–36.0)
MCV: 94.9 fl (ref 78.0–100.0)
Platelets: 276 10*3/uL (ref 150.0–400.0)
RBC: 4.02 Mil/uL — AB (ref 4.22–5.81)
RDW: 13.1 % (ref 11.5–15.5)
WBC: 5.4 10*3/uL (ref 4.0–10.5)

## 2015-01-20 LAB — COMPREHENSIVE METABOLIC PANEL
ALBUMIN: 4 g/dL (ref 3.5–5.2)
ALT: 19 U/L (ref 0–53)
AST: 19 U/L (ref 0–37)
Alkaline Phosphatase: 153 U/L — ABNORMAL HIGH (ref 39–117)
BUN: 15 mg/dL (ref 6–23)
CALCIUM: 9.4 mg/dL (ref 8.4–10.5)
CO2: 28 mEq/L (ref 19–32)
Chloride: 106 mEq/L (ref 96–112)
Creatinine, Ser: 1.02 mg/dL (ref 0.40–1.50)
GFR: 92.38 mL/min (ref >60.00)
Glucose, Bld: 138 mg/dL — ABNORMAL HIGH (ref 70–99)
Potassium: 4.1 mEq/L (ref 3.5–5.1)
SODIUM: 138 meq/L (ref 135–145)
TOTAL PROTEIN: 6.7 g/dL (ref 6.0–8.3)
Total Bilirubin: 0.8 mg/dL (ref 0.2–1.2)

## 2015-01-20 LAB — TSH: TSH: 2.21 u[IU]/mL (ref 0.35–4.50)

## 2015-01-20 LAB — HEMOGLOBIN A1C: Hgb A1c MFr Bld: 5.9 % (ref 4.6–6.5)

## 2015-01-20 NOTE — Assessment & Plan Note (Signed)
reasonable control on flomax. Continue.

## 2015-01-20 NOTE — Assessment & Plan Note (Signed)
Difficult to evaluate fully. History steroid injections with knee arthritis. I only see one a1c at 6.6 with all other labs <6 primarily. Has had elevated CBGs but circumstance unclear. Check a1c and fasting cmet today, if both values elevated-call diabetes, if 1, consider follow up. Could have been steroid induced but not true DM.

## 2015-01-20 NOTE — Progress Notes (Signed)
Tanner Reddish, MD Phone: [phone]  Subjective:  Patient presents today to establish care with me as their new primary care provider. Patient was formerly a patient of Dr. Leanne Chang. Chief complaint-noted.   Hypertension-controlled on  Amlodipine 10mg , labetalol 200 BID BP Readings from Last 3 Encounters:  01/20/15 92/60  12/24/14 128/78  10/29/14 152/88  Home BP monitoring-no Compliant with medications-yes without side effects ROS-Denies any CP, HA, SOB, LE edema  Hyperlipidemia-mild poor control on atorvastatin 40mg   Lab Results  Component Value Date   LDLCALC 126* 07/22/2014  ROS- no chest pain or shortness of breath. No myalgias  BPH with nocturia-reasonable control on flomax ROS- nocturia once a night   Hyperglycemia/DM?  Patient unaware of diagnosis. Not on any medication. 1 a1c 6.6 but unclear relation to steroid injections. Elevated cbgs in past but once again was getting steroids ROS- no polyphagia, polydipisia.   The following were reviewed and entered/updated in epic: Past Medical History  Diagnosis Date  . HYPERLIPIDEMIA     takes Lipitor daily  . dm 2     pt denies dm  . Diabetes mellitus     not taking any med  . HYPERTENSION     takes Amlodipine and Labetalol daily  . Constipation     takes Miralax daily as needed  . Pneumonia     hx of-as a child  . Arthritis   . Joint pain   . Joint swelling   . Urinary frequency     takes Flomax daily  . Nocturia   . Arthrofibrosis of total knee arthroplasty, right 03/26/2014  . Osteoarthritis of left knee 03/26/2014   Patient Active Problem List   Diagnosis Date Noted  . Focal myoclonus 06/05/2014    Priority: Medium  . BPH with obstruction/lower urinary tract symptoms 06/05/2012    Priority: Medium  . Hyperlipidemia 06/19/2007    Priority: Medium  . Essential hypertension 06/19/2007    Priority: Medium  . Constipation 01/20/2015    Priority: Low  . Osteoarthritis of knee s/p bilateral knee  replacements 03/26/2014    Priority: Low  . Spongiotic dermatitis 03/19/2013    Priority: Low  . Personal history of colonic adenoma 10/30/2012    Priority: Low  . dm 2 02/12/2008   Past Surgical History  Procedure Laterality Date  . Total knee arthroplasty  2011    right, Dr. [name] per pt  . Total knee arthroplasty Left 03/26/2014    DR LANDAU  . Total knee arthroplasty Left 03/26/2014    Procedure: TOTAL KNEE ARTHROPLASTY;  Surgeon: [name], MD;  Location: [facility];  Service: Orthopedics;  Laterality: Left;  . Knee closed reduction Right 03/26/2014    Procedure: CLOSED MANIPULATION KNEE;  Surgeon: [name], MD;  Location: [facility];  Service: Orthopedics;  Laterality: Right;  . Colonoscopy      Family History  Problem Relation Age of Onset  . Colon cancer Neg Hx   . Stomach cancer Neg Hx   . Esophageal cancer Neg Hx   . Rectal cancer Neg Hx   . Brain cancer Father   . Renal Disease Brother     ESRD, unknown cause    Medications- reviewed and updated Current Outpatient Prescriptions  Medication Sig Dispense Refill  . amLODipine (NORVASC) 10 MG tablet TAKE 1 TABLET BY MOUTH EVERY DAY 90 tablet 0  . aspirin EC 81 MG tablet Take 81 mg by mouth daily.    [name] atorvastatin (LIPITOR) 40 MG tablet Take  1 tablet (40 mg total) by mouth daily. 90 tablet 3  . labetalol (NORMODYNE) 200 MG tablet TAKE 1 TABLET BY MOUTH TWICE DAILY 60 tablet 5  . Multiple Vitamin (MULTIVITAMIN) tablet Take 1 tablet by mouth daily.    . polyethylene glycol powder (GLYCOLAX/MIRALAX) powder MIX 17 GRAMS INTO WATER OR JUICE AND DRINK TWICE DAILY AS NEEDED 527 g 0  . tamsulosin (FLOMAX) 0.4 MG CAPS capsule TAKE 1 CAPSULE BY MOUTH DAILY 90 capsule 1  . baclofen (LIORESAL) 10 MG tablet Take 1 tablet (10 mg total) by mouth 3 (three) times daily. As needed for muscle spasm (Patient not taking: Reported on 01/20/2015) 50 each 0   Allergies-reviewed and updated Allergies  Allergen Reactions  . Telmisartan  Other (See Comments)    REACTION: headache    History   Social History  . Marital Status: Married    Spouse Name: [name]    Number of Children: 5  . Years of Education: 9th   Occupational History  . retired     Architect   Social History Main Topics  . Smoking status: Never Smoker   . Smokeless tobacco: Never Used  . Alcohol Use: No  . Drug Use: No  . Sexual Activity: Yes   Other Topics Concern  . Not on file   Social History Narrative   Married, together 37 years in 2016. 1 son, 2 daughters. Can't count # grandkids at least 71, 50. 1 greatgranddaughter      Grandson and daughter live with them      Retired from Architect work      Hobbies: time with grand kids-play basketball    ROS--See HPI   Objective: BP 92/60 mmHg  Temp(Src) 98.3 F (36.8 C)  Wt 184 lb (83.462 kg) Gen: NAD, resting comfortably CV: RRR no murmurs rubs or gallops Lungs: CTAB no crackles, wheeze, rhonchi Abdomen: soft/nontender/nondistended/normal bowel sounds.  Ext: no edema, callus noted on bottom of left foot at base of 5th MTP joint Skin: warm, dry Neuro: grossly normal, moves all extremities, PERRLA  Assessment/Plan:  Essential hypertension controlled on  Amlodipine 10mg , labetalol 200 BID. SBP 92 but previously 128 and 152. Patient asymptomatic, no changes at this time.    Hyperlipidemia mild poor control on atorvastatin 40mg . Check LDL today. I would be hesitant to increase atorvastatin to 80mg  given hyperglycemia and no history of MI/CVA so may balance and leave at same dose.    BPH with obstruction/lower urinary tract symptoms reasonable control on flomax. Continue.    dm 2 Difficult to evaluate fully. History steroid injections with knee arthritis. I only see one a1c at 6.6 with all other labs <6 primarily. Has had elevated CBGs but circumstance unclear. Check a1c and fasting cmet today, if both values elevated-call diabetes, if 1, consider follow up. Could have been  steroid induced but not true DM.    refer to podiatry for callus. Patient high risk but not clear diagnosis.   Return precautions advised. 3-4 month follow up.   Fasting despite late in day Orders Placed This Encounter  Procedures  . CBC    [place]  . Comprehensive metabolic panel    [place]    Order Specific Question:  Has the patient fasted?    Answer:  No  . Lipid panel    [place]    Order Specific Question:  Has the patient fasted?    Answer:  No  . Hemoglobin A1c    [place]  . TSH  [place]  . Ambulatory referral to Podiatry    Referral Priority:  Routine    Referral Type:  Consultation    Referral Reason:  Specialty Services Required    Requested Specialty:  Podiatry    Number of Visits Requested:  1

## 2015-01-20 NOTE — Patient Instructions (Signed)
Check labs to see if you have diabetes  If you do, follow up within 3 months to discuss diagnosis, meaning, health maintenance needed  Hope cholesterol is better, may increase medicine if not.   Refer to foot doctor give your callus'.

## 2015-01-20 NOTE — Assessment & Plan Note (Signed)
mild poor control on atorvastatin 40mg . Check LDL today. I would be hesitant to increase atorvastatin to 80mg  given hyperglycemia and no history of MI/CVA so may balance and leave at same dose.

## 2015-01-20 NOTE — Assessment & Plan Note (Signed)
controlled on  Amlodipine 10mg , labetalol 200 BID. SBP 92 but previously 128 and 152. Patient asymptomatic, no changes at this time.

## 2015-02-14 ENCOUNTER — Encounter: Payer: Self-pay | Admitting: Podiatry

## 2015-02-14 ENCOUNTER — Ambulatory Visit (INDEPENDENT_AMBULATORY_CARE_PROVIDER_SITE_OTHER): Payer: Medicare Other | Admitting: Podiatry

## 2015-02-14 VITALS — BP 113/76 | HR 65 | Resp 16

## 2015-02-14 DIAGNOSIS — B351 Tinea unguium: Secondary | ICD-10-CM | POA: Diagnosis not present

## 2015-02-14 DIAGNOSIS — L84 Corns and callosities: Secondary | ICD-10-CM

## 2015-02-14 DIAGNOSIS — M79676 Pain in unspecified toe(s): Secondary | ICD-10-CM

## 2015-02-14 DIAGNOSIS — E119 Type 2 diabetes mellitus without complications: Secondary | ICD-10-CM | POA: Diagnosis not present

## 2015-02-14 NOTE — Progress Notes (Signed)
   Subjective:    Patient ID: Tanner Griffin, male    DOB: [birth year], 74 y.o.   MRN: [id number]  HPI 74 year old male presents the office today with complaints of painful, thickened toenails as well as for painful calluses to bilateral feet. He denies any redness or drainage on the nail sites on the side of the calluses. He states that the areas are painful particularly with pressure. No other complaints at this time.   Review of Systems  Musculoskeletal: Positive for gait problem.  All other systems reviewed and are negative.      Objective:   Physical Exam AAO 3, NAD DP/PT pulses palpable, CRT less than 3 seconds Protective sensation intact with Simms Weinstein monofilament, vibratory sensation intact, Achilles tendon reflex intact. Nails are hypertrophic, dystrophic, elongated, brittle, discolored 10. There is no surrounding erythema or drainage on the nail sites. There is subjective tenderness on nails 1 through 5 bilaterally. Hyperkeratotic lesions bilateral medial hallux and submetatarsal 5. Upon debridement of the lesions no underlying ulceration, drainage or other clinical signs of infection. No other areas of tenderness to bilateral lower extremity. No overlying edema, erythema, increase in warmth. MMT 5/5, ROM WNL No other open lesions or pre-ulcerative lesions identified bilaterally. No pain with calf compression, swelling, warmth, erythema.       Assessment & Plan:  74 year old male with symptomatic onychomycosis, hyperkeratotic lesions 4 -Treatment options discussed with the patient including alternatives, risks, complications. -Nail sharply debrided 10 without complications/bleeding. -Hyperkeratotic lesion sharply debrided 4 without complication/bleeding. -Discussed daily foot inspection. -Follow-up in 3 months or sooner if any problems are to arise. In the meantime, encouraged to call the office with any questions, concerns, change in symptoms.

## 2015-02-16 ENCOUNTER — Encounter: Payer: Self-pay | Admitting: Podiatry

## 2015-02-18 ENCOUNTER — Other Ambulatory Visit: Payer: Self-pay | Admitting: Internal Medicine

## 2015-02-22 ENCOUNTER — Other Ambulatory Visit: Payer: Self-pay | Admitting: Internal Medicine

## 2015-04-14 DIAGNOSIS — Z96653 Presence of artificial knee joint, bilateral: Secondary | ICD-10-CM | POA: Diagnosis not present

## 2015-04-14 DIAGNOSIS — M5432 Sciatica, left side: Secondary | ICD-10-CM | POA: Diagnosis not present

## 2015-04-23 ENCOUNTER — Other Ambulatory Visit: Payer: Self-pay | Admitting: Internal Medicine

## 2015-05-22 ENCOUNTER — Other Ambulatory Visit: Payer: Self-pay | Admitting: Family Medicine

## 2015-05-30 ENCOUNTER — Ambulatory Visit: Payer: Medicare Other | Admitting: Podiatry

## 2015-06-02 ENCOUNTER — Ambulatory Visit (INDEPENDENT_AMBULATORY_CARE_PROVIDER_SITE_OTHER): Payer: Medicare Other | Admitting: Family Medicine

## 2015-06-02 ENCOUNTER — Encounter: Payer: Self-pay | Admitting: Family Medicine

## 2015-06-02 VITALS — BP 140/78 | HR 64 | Temp 98.7°F | Wt 178.0 lb

## 2015-06-02 DIAGNOSIS — H6123 Impacted cerumen, bilateral: Secondary | ICD-10-CM

## 2015-06-02 DIAGNOSIS — H918X3 Other specified hearing loss, bilateral: Secondary | ICD-10-CM

## 2015-06-02 NOTE — Progress Notes (Signed)
Garret Reddish, MD  Subjective:  MALIKI GIGNAC is a 74 y.o. year old very pleasant male patient who presents with:  Hearing Loss -reduced hearing bilaterally for at least 6 months. Has had ears irrigated before and helps improve hearing. Never seen ENT. No progressive loss in last 6 months. Tried a home kit without relief but no medications tried.  ROS- no tinnitus, ears feel full  Past Medical History- hyperlipidemia, focal myoclonus, HTN, BPH  Medications- reviewed and updated Current Outpatient Prescriptions  Medication Sig Dispense Refill  . amLODipine (NORVASC) 10 MG tablet TAKE 1 TABLET BY MOUTH DAILY 90 tablet 2  . aspirin EC 81 MG tablet Take 81 mg by mouth daily.    [name] atorvastatin (LIPITOR) 40 MG tablet Take 1 tablet (40 mg total) by mouth daily. 90 tablet 3  . labetalol (NORMODYNE) 200 MG tablet TAKE 1 TABLET BY MOUTH TWICE DAILY 60 tablet 5  . meloxicam (MOBIC) 15 MG tablet Take 15 mg by mouth daily.    . Multiple Vitamin (MULTIVITAMIN) tablet Take 1 tablet by mouth daily.    . tamsulosin (FLOMAX) 0.4 MG CAPS capsule TAKE ONE CAPSULE BY MOUTH DAILY 90 capsule 0  . polyethylene glycol powder (GLYCOLAX/MIRALAX) powder MIX 17 GRAMS INTO WATER OR JUICE AND DRINK TWICE DAILY AS NEEDED (Patient not taking: Reported on 06/02/2015) 527 g 0   No current facility-administered medications for this visit.    Objective: BP 140/78 mmHg  Pulse 64  Temp(Src) 98.7 F (37.1 C)  Wt 178 lb (80.74 kg) Gen: NAD, resting comfortably Ears: TM obscured bilaterally by cerumen. Irrigation attempted without any improvement.  Oropharynx normal CV: RRR no murmurs rubs or gallops Lungs: CTAB no crackles, wheeze, rhonchi Ext: no edema Skin: warm, dry, no rash   Assessment/Plan:  Hearing loss due to cerumen impaction, bilateral - suspect hearing loss (mild) is due to cerumen impaction. Unable to irrigate adequately in office today. Gave option ENT vs. Mineral oil therapy and return 1 week and  opts for ENT. Hopeful to get patient on a long term maintenance plan as well for prevention. Advised stopping q tips at minimum today Plan: Ambulatory referral to ENT  Orders Placed This Encounter  Procedures  . Ambulatory referral to ENT    Referral Priority:  Routine    Referral Type:  Consultation    Referral Reason:  Specialty Services Required    Requested Specialty:  Otolaryngology    Number of Visits Requested:  1

## 2015-06-02 NOTE — Patient Instructions (Signed)
We will call you within a week about your referral to Ear, nose and throat doctor. If you do not hear within 2 weeks, give [place] a call. They have more sophisticated tools to remove wax.   Sorry I don't have a cheaper place to get the flomax or labetalol

## 2015-06-04 ENCOUNTER — Encounter: Payer: Self-pay | Admitting: Podiatry

## 2015-06-04 ENCOUNTER — Ambulatory Visit (INDEPENDENT_AMBULATORY_CARE_PROVIDER_SITE_OTHER): Payer: Medicare Other | Admitting: Podiatry

## 2015-06-04 DIAGNOSIS — M79676 Pain in unspecified toe(s): Secondary | ICD-10-CM | POA: Diagnosis not present

## 2015-06-04 DIAGNOSIS — L84 Corns and callosities: Secondary | ICD-10-CM

## 2015-06-04 DIAGNOSIS — B351 Tinea unguium: Secondary | ICD-10-CM | POA: Diagnosis not present

## 2015-06-09 ENCOUNTER — Other Ambulatory Visit: Payer: Self-pay | Admitting: Internal Medicine

## 2015-06-11 DIAGNOSIS — H6123 Impacted cerumen, bilateral: Secondary | ICD-10-CM | POA: Diagnosis not present

## 2015-06-11 NOTE — Progress Notes (Signed)
Patient ID: Tanner Griffin, male   DOB: Sep 11, 1941, 74 y.o.   MRN: [id number]  Subjective: 74 y.o.-year-old male returns the office today for painful, elongated, thickened toenails which he is unable to trim himself. Denies any redness or drainage around the nails. Denies any acute changes since last appointment and no new complaints today. Denies any systemic complaints such as fevers, chills, nausea, vomiting.   Objective: AAO 3, NAD DP/PT pulses palpable, CRT less than 3 seconds Protective sensation intact with Simms Weinstein monofilament, Achilles tendon reflex intact.  Nails hypertrophic, dystrophic, elongated, brittle, discolored 10. There is tenderness overlying the nails 1-5 biaterlaly. There is no surrounding erythema or drainage along the nail sites. No open lesions or pre-ulcerative lesions are identified. Hyperkerotic lesions to bilateral medial hallux and submetatarsal 5. Upon debridement no underlying ulceration, drainage, or other clinical signs of infection.  No other areas of tenderness bilateral lower extremities. No overlying edema, erythema, increased warmth. No pain with calf compression, swelling, warmth, erythema.  Assessment: Patient presents with symptomatic onychomycosis; hyperkerotic lesions x 4  Plan: -Treatment options including alternatives, risks, complications were discussed -Nails sharply debrided 10 without complication/bleeding. -Hyperkerotic lesions sharply debrided x 4 without complications/bleeding.  -Discussed daily foot inspection. If there are any changes, to call the office immediately.  -Follow-up in 3 months or sooner if any problems are to arise. In the meantime, encouraged to call the office with any questions, concerns, changes symptoms.

## 2015-07-24 ENCOUNTER — Telehealth: Payer: Self-pay | Admitting: Family Medicine

## 2015-07-24 NOTE — Telephone Encounter (Signed)
May use sda

## 2015-07-24 NOTE — Telephone Encounter (Addendum)
Pt is having stomach issues.  Pt stomach is swollen and he has been having trouble w/ his bowels. Would like to see you on mon, but only one same day appt left. pls advise or if pt needs to see someone else. Thank you!

## 2015-07-25 NOTE — Telephone Encounter (Signed)
Pt has been scheduled.  °

## 2015-07-28 ENCOUNTER — Ambulatory Visit: Payer: Medicare Other | Admitting: Family Medicine

## 2015-07-31 ENCOUNTER — Ambulatory Visit (INDEPENDENT_AMBULATORY_CARE_PROVIDER_SITE_OTHER): Payer: Medicare Other | Admitting: Family Medicine

## 2015-07-31 ENCOUNTER — Encounter: Payer: Self-pay | Admitting: Family Medicine

## 2015-07-31 VITALS — BP 122/64 | HR 68 | Temp 98.8°F | Wt 175.0 lb

## 2015-07-31 DIAGNOSIS — R1033 Periumbilical pain: Secondary | ICD-10-CM

## 2015-07-31 NOTE — Patient Instructions (Signed)
Medication Instructions:  Start miralax daily through next Monday mobic  Other Instructions:  They will call you about the ultrasound  If labs and ultrasound are better and pain doesn't improve with more frequent bowel movements, we will likely treat you with an ulcer medicine for 3 weeks and if no improvement, consider CT scan  Labwork: Before you leave  Follow-Up: Touch base after labs and imaging. Let me know how you are doing next Wednesday or Thursday. Come in sooner if new or worsening symptoms

## 2015-07-31 NOTE — Progress Notes (Signed)
Garret Reddish, MD  Subjective:  Tanner Griffin is a 74 y.o. year old very pleasant male patient who presents with:  Abdominal pain (periumbilical) -off and on abdominal pain. Around belly button to left and right just above. Comes and goes. Stays for about 45 minutes to an hour when it is bothering him. Occurs 3-4 x a day. Pain gets up to 8/10 at its peak. Tylenol helps. pepto bismol also helps. Never had anything like this. Eats dinner only. May eat light lunch. Does not get better when he eats.Perhaps more frequent after meals. Eating prunes and taking miralax twice a week- bowel movement everyday if eats prunes but not always doing this. Hard stools and has to strain. Does do a lot of heavy lifting at home and may be worse with that activity.   10/30/12 normal colonoscopy. No family or personal history of abdominal cancer.   ROS- no nausea, vomiting. No diarrhea. No fever/chills   Past Medical History- HLD, focal myoclonus, HTN, BPH  Medications- reviewed and updated Current Outpatient Prescriptions  Medication Sig Dispense Refill  . amLODipine (NORVASC) 10 MG tablet TAKE 1 TABLET BY MOUTH DAILY 90 tablet 2  . aspirin EC 81 MG tablet Take 81 mg by mouth daily.    [name] atorvastatin (LIPITOR) 40 MG tablet Take 1 tablet (40 mg total) by mouth daily. 90 tablet 3  . labetalol (NORMODYNE) 200 MG tablet TAKE 1 TABLET BY MOUTH TWICE DAILY 60 tablet 5  . meloxicam (MOBIC) 15 MG tablet Take 15 mg by mouth daily.    . Multiple Vitamin (MULTIVITAMIN) tablet Take 1 tablet by mouth daily.    . tamsulosin (FLOMAX) 0.4 MG CAPS capsule TAKE ONE CAPSULE BY MOUTH DAILY 90 capsule 0  . polyethylene glycol powder (GLYCOLAX/MIRALAX) powder MIX 17 GRAMS INTO WATER OR JUICE AND DRINK TWICE DAILY AS NEEDED (Patient not taking: Reported on 07/31/2015) 527 g 2   Objective: BP 122/64 mmHg  Pulse 68  Temp(Src) 98.8 F (37.1 C)  Wt 175 lb (79.379 kg) Gen: NAD, resting comfortably CV: RRR no murmurs rubs or  gallops Lungs: CTAB no crackles, wheeze, rhonchi Abdomen: soft/diastasis recti noted. Some pain with palpation above umbilicus/nondistended/normal bowel sounds. No rebound or guarding.  Ext: no edema, right knee in brace Skin: warm, dry Neuro: grossly normal, moves all extremities  Assessment/Plan:  Periumbilical abdominal pain -  Unclear cause. Patient does have constipation issues and this could contribute so we will use miralax daily through Monday to see if that helps. Obtain baseline labs. Patient states pain does not feel deep, ultrasound may be beneficial and will obtain. If regular BM does not help pain and labs and u/s unremarkable, consider treatment for ulceration though course does not seem consistent with that with PPI for 3 weeks and if continued issues- consider CT.   Plan: CBC with Differential/Platelet, Comprehensive metabolic panel, Lipase, US Abdomen Complete  Orders Placed This Encounter  Procedures  . US Abdomen Complete    Standing Status: Future     Number of Occurrences:      Standing Expiration Date: 09/29/2016    Scheduling Instructions:     Please make sure listed correctly-can change to [facility] if needed    Order Specific Question:  Reason for Exam (SYMPTOM  OR DIAGNOSIS REQUIRED)    Answer:  periumbilical abdominal pain 3 weeks    Order Specific Question:  Preferred imaging location?    Answer:  [place]  . CBC with Differential/Platelet  . Comprehensive  metabolic panel    [place]  . Lipase

## 2015-08-01 LAB — CBC WITH DIFFERENTIAL/PLATELET
Basophils Absolute: 0.2 10*3/uL — ABNORMAL HIGH (ref 0.0–0.1)
Basophils Relative: 2.9 % (ref 0.0–3.0)
EOS PCT: 2.4 % (ref 0.0–5.0)
Eosinophils Absolute: 0.2 10*3/uL (ref 0.0–0.7)
HCT: 37.4 % — ABNORMAL LOW (ref 39.0–52.0)
Hemoglobin: 12.4 g/dL — ABNORMAL LOW (ref 13.0–17.0)
LYMPHS PCT: 40.3 % (ref 12.0–46.0)
Lymphs Abs: 2.7 10*3/uL (ref 0.7–4.0)
MCHC: 33.2 g/dL (ref 30.0–36.0)
MCV: 97.5 fl (ref 78.0–100.0)
MONOS PCT: 12.6 % — AB (ref 3.0–12.0)
Monocytes Absolute: 0.9 10*3/uL (ref 0.1–1.0)
NEUTROS PCT: 41.8 % — AB (ref 43.0–77.0)
Neutro Abs: 2.8 10*3/uL (ref 1.4–7.7)
PLATELETS: 308 10*3/uL (ref 150.0–400.0)
RBC: 3.83 Mil/uL — ABNORMAL LOW (ref 4.22–5.81)
RDW: 13.1 % (ref 11.5–15.5)
WBC: 6.8 10*3/uL (ref 4.0–10.5)

## 2015-08-01 LAB — COMPREHENSIVE METABOLIC PANEL
ALBUMIN: 4.2 g/dL (ref 3.5–5.2)
ALT: 19 U/L (ref 0–53)
AST: 21 U/L (ref 0–37)
Alkaline Phosphatase: 162 U/L — ABNORMAL HIGH (ref 39–117)
BUN: 20 mg/dL (ref 6–23)
CALCIUM: 9.9 mg/dL (ref 8.4–10.5)
CHLORIDE: 109 meq/L (ref 96–112)
CO2: 27 meq/L (ref 19–32)
Creatinine, Ser: 1.06 mg/dL (ref 0.40–1.50)
GFR: 88.24 mL/min (ref >60.00)
Glucose, Bld: 93 mg/dL (ref 70–99)
POTASSIUM: 4 meq/L (ref 3.5–5.1)
SODIUM: 143 meq/L (ref 135–145)
TOTAL PROTEIN: 7.4 g/dL (ref 6.0–8.3)
Total Bilirubin: 0.8 mg/dL (ref 0.2–1.2)

## 2015-08-01 LAB — LIPASE: Lipase: 21 U/L (ref 11.0–59.0)

## 2015-08-05 ENCOUNTER — Other Ambulatory Visit: Payer: Self-pay | Admitting: Family Medicine

## 2015-08-13 ENCOUNTER — Ambulatory Visit
Admission: RE | Admit: 2015-08-13 | Discharge: 2015-08-13 | Disposition: A | Discharge status: Home / Self Care | Payer: Medicare Other | Source: Ambulatory Visit | Attending: Family Medicine | Admitting: Family Medicine

## 2015-08-13 DIAGNOSIS — R1033 Periumbilical pain: Secondary | ICD-10-CM

## 2015-08-19 ENCOUNTER — Other Ambulatory Visit: Payer: Self-pay | Admitting: Internal Medicine

## 2015-09-09 ENCOUNTER — Telehealth: Payer: Self-pay | Admitting: Family Medicine

## 2015-09-09 NOTE — Telephone Encounter (Signed)
Pt request refill nitroGLYCERIN (NITROSTAT) 0.4 MG SL tablet Pt keeps on hand and realized they have expired 2015 [facility]/ [address]

## 2015-09-09 NOTE — Telephone Encounter (Signed)
Is this ok? Not on his current med list.

## 2015-09-09 NOTE — Telephone Encounter (Signed)
Nitroglycerin is for people with coronary artery disease. Patient was tested in 2013 and it was negative for this. He does not need nitroglycerin. If he has chest pain he needs to be evaluated.

## 2015-09-10 NOTE — Telephone Encounter (Signed)
Pt wife notified.

## 2015-09-12 ENCOUNTER — Ambulatory Visit: Payer: Medicare Other | Admitting: Podiatry

## 2015-09-15 ENCOUNTER — Telehealth: Payer: Self-pay | Admitting: Family Medicine

## 2015-09-15 NOTE — Telephone Encounter (Addendum)
Pt's would like to know if he can come in at 11:30 on thurs, 10/6? That is a same day appt. However, pt and his wife must ride together and her appt is 11 am 10/6  w/ dr Regis Bill.  He is being seen for fu on a med he was put on. pls advise if ok to schedule

## 2015-09-16 NOTE — Telephone Encounter (Signed)
Are we talking about Thursday 9/29? If so-yes may add him. I am out of office on 30th

## 2015-09-16 NOTE — Telephone Encounter (Signed)
See below

## 2015-09-17 NOTE — Telephone Encounter (Signed)
I do apologize, the appts are supposed to be 10/6, next thurs. Is that still OK?

## 2015-09-17 NOTE — Telephone Encounter (Signed)
I misread your original message! Thanks for scheduling him. Sorry for any confusion

## 2015-09-25 ENCOUNTER — Ambulatory Visit: Payer: Medicare Other | Admitting: Family Medicine

## 2015-10-17 ENCOUNTER — Ambulatory Visit (INDEPENDENT_AMBULATORY_CARE_PROVIDER_SITE_OTHER): Payer: Medicare Other | Admitting: Family Medicine

## 2015-10-17 ENCOUNTER — Encounter: Payer: Self-pay | Admitting: Family Medicine

## 2015-10-17 VITALS — BP 140/78 | HR 73 | Temp 98.5°F | Wt 178.0 lb

## 2015-10-17 DIAGNOSIS — E1151 Type 2 diabetes mellitus with diabetic peripheral angiopathy without gangrene: Secondary | ICD-10-CM

## 2015-10-17 DIAGNOSIS — Z23 Encounter for immunization: Secondary | ICD-10-CM | POA: Diagnosis not present

## 2015-10-17 DIAGNOSIS — I10 Essential (primary) hypertension: Secondary | ICD-10-CM | POA: Diagnosis not present

## 2015-10-17 DIAGNOSIS — E119 Type 2 diabetes mellitus without complications: Secondary | ICD-10-CM

## 2015-10-17 LAB — HEMOGLOBIN A1C
HEMOGLOBIN A1C: 5.7 % — AB (ref <5.7)
MEAN PLASMA GLUCOSE: 117 mg/dL — AB (ref <117)

## 2015-10-17 NOTE — Patient Instructions (Addendum)
Flu shot and [id number] received today.  Contact insurance regarding Shingles shot.  You do officially have diabetes after review of your history but do not need treatment, check a1c today  Labs before you leave  Referred to eye doctor- need yearly eye exam with diabetes

## 2015-10-17 NOTE — Assessment & Plan Note (Signed)
S: controlled without medication. Fasting CBG 01/20/15 138.  Combined with a1c 6.6 on 03/19/13 leads to diagnosis Lab Results  Component Value Date   HGBA1C 5.9 01/20/2015  A/P:Continue without medication, encouraged patient to continue to remain active and eat healthy

## 2015-10-17 NOTE — Assessment & Plan Note (Signed)
S: mild poor controll. Controlled last visit  BP Readings from Last 3 Encounters:  10/17/15 140/78  07/31/15 122/64  06/02/15 140/78  A/P:Continue current meds:  Amlodipine 10mg , labetalol 200 BID. Discussed if remains high next visit may need to adjust medication up- actually would be adding 3rd agent

## 2015-10-17 NOTE — Progress Notes (Signed)
Garret Reddish, MD  Subjective:  Tanner Griffin is a 74 y.o. year old very pleasant male patient who presents for/with See problem oriented charting ROS- Periumbilical abdominal pain at last visit has now been gone for month. He feels at times like his left arm feels solid but no pain and does not really describe numbness/tingling.. No hypoglycemia or blurry vision  Past Medical History-  Patient Active Problem List   Diagnosis Date Noted  . dm 2 02/12/2008    Priority: High  . Focal myoclonus 06/05/2014    Priority: Medium  . BPH with obstruction/lower urinary tract symptoms 06/05/2012    Priority: Medium  . Hyperlipidemia 06/19/2007    Priority: Medium  . Essential hypertension 06/19/2007    Priority: Medium  . Constipation 01/20/2015    Priority: Low  . Osteoarthritis of knee s/p bilateral knee replacements 03/26/2014    Priority: Low  . Spongiotic dermatitis 03/19/2013    Priority: Low  . Personal history of colonic adenoma 10/30/2012    Priority: Low    Medications- reviewed and updated Current Outpatient Prescriptions  Medication Sig Dispense Refill  . amLODipine (NORVASC) 10 MG tablet TAKE 1 TABLET BY MOUTH DAILY 90 tablet 2  . aspirin EC 81 MG tablet Take 81 mg by mouth daily.    [name] atorvastatin (LIPITOR) 40 MG tablet TAKE 1 TABLET BY MOUTH EVERY DAY 90 tablet 0  . labetalol (NORMODYNE) 200 MG tablet TAKE 1 TABLET BY MOUTH TWICE DAILY 60 tablet 5  . meloxicam (MOBIC) 15 MG tablet Take 15 mg by mouth daily.    . Multiple Vitamin (MULTIVITAMIN) tablet Take 1 tablet by mouth daily.    . polyethylene glycol powder (GLYCOLAX/MIRALAX) powder MIX 17 GRAMS INTO WATER OR JUICE AND DRINK TWICE DAILY AS NEEDED (Patient not taking: Reported on 07/31/2015) 527 g 2  . tamsulosin (FLOMAX) 0.4 MG CAPS capsule TAKE 1 CAPSULE BY MOUTH DAILY 90 capsule 3   No current facility-administered medications for this visit.    Objective: BP 140/78 mmHg  Pulse 73  Temp(Src) 98.5 F (36.9  C)  Wt 178 lb (80.74 kg) Gen: NAD, resting comfortably CV: RRR no murmurs rubs or gallops Lungs: CTAB no crackles, wheeze, rhonchi Abdomen: soft/nontender/nondistended/normal bowel sounds. No rebound or guarding.  Ext: no edema Skin: warm, dry Neuro: grossly normal, moves all extremities. Normal grip strength and bicep strength in left arm. Intact to sensation to gross touch MSK: requires assist to get shoes off due toOA  Diabetic Foot Exam - Simple   Simple Foot Form  Diabetic Foot exam was performed with the following findings:  Yes 10/17/2015  4:15 PM  Visual Inspection  No deformities, no ulcerations, no other skin breakdown bilaterally:  Yes  Sensation Testing  Intact to touch and monofilament testing bilaterally:  Yes  Pulse Check  Posterior Tibialis and Dorsalis pulse intact bilaterally:  Yes  Comments  Some onychomycosis follows with podiatry     Assessment/Plan:  dm 2 S: controlled without medication. Fasting CBG 01/20/15 138.  Combined with a1c 6.6 on 03/19/13 leads to diagnosis Lab Results  Component Value Date   HGBA1C 5.9 01/20/2015  A/P:Continue without medication, encouraged patient to continue to remain active and eat healthy   Essential hypertension S: mild poor controll. Controlled last visit  BP Readings from Last 3 Encounters:  10/17/15 140/78  07/31/15 122/64  06/02/15 140/78  A/P:Continue current meds:  Amlodipine 10mg , labetalol 200 BID. Discussed if remains high next visit may need to adjust  medication up- actually would be adding 3rd agent    Health Maintenance Due  Topic Date Due  . ZOSTAVAX - declines until checks with insurance 04/02/2003  . OPHTHALMOLOGY EXAM - refer 11/19/2014  . FOOT EXAM - today 11/30/2014  . URINE MICROALBUMIN - today 11/30/2014  . HEMOGLOBIN A1C - today 07/21/2015   3 motnsh Return precautions advised.   Orders Placed This Encounter  Procedures  . Pneumococcal conjugate vaccine 13-valent  . Flu Vaccine QUAD 36+  mos IM  . Hemoglobin A1c  . Microalbumin / creatinine urine ratio    Standing Status: Future     Number of Occurrences:      Standing Expiration Date: 11/16/2016  . Ambulatory referral to Ophthalmology    Referral Priority:  Routine    Referral Type:  Consultation    Referral Reason:  Specialty Services Required    Requested Specialty:  Ophthalmology    Number of Visits Requested:  1

## 2015-10-20 ENCOUNTER — Other Ambulatory Visit: Payer: Medicare Other

## 2015-10-20 DIAGNOSIS — E119 Type 2 diabetes mellitus without complications: Secondary | ICD-10-CM | POA: Diagnosis not present

## 2015-10-20 LAB — MICROALBUMIN / CREATININE URINE RATIO
Creatinine,U: 51.3 mg/dL
MICROALB UR: 1.1 mg/dL (ref 0.0–1.9)
MICROALB/CREAT RATIO: 2.1 mg/g (ref 0.0–30.0)

## 2015-11-19 ENCOUNTER — Encounter: Payer: Self-pay | Admitting: Family Medicine

## 2015-11-19 DIAGNOSIS — H2513 Age-related nuclear cataract, bilateral: Secondary | ICD-10-CM | POA: Diagnosis not present

## 2015-11-19 DIAGNOSIS — H25013 Cortical age-related cataract, bilateral: Secondary | ICD-10-CM | POA: Diagnosis not present

## 2015-11-19 DIAGNOSIS — E119 Type 2 diabetes mellitus without complications: Secondary | ICD-10-CM | POA: Diagnosis not present

## 2015-11-19 LAB — HM DIABETES EYE EXAM

## 2016-01-15 ENCOUNTER — Other Ambulatory Visit: Payer: Self-pay | Admitting: Family Medicine

## 2016-01-30 ENCOUNTER — Telehealth: Payer: Self-pay | Admitting: Family Medicine

## 2016-01-30 IMAGING — CR DG KNEE 1-2V PORT*L*
2 series · 2 of 2 positions shown · non-contrast
Comparison: DG KNEE COMPLETE 4 VIEWS*L* dated 11/30/2013

CLINICAL DATA: Arthroplasty.

EXAM:
PORTABLE LEFT KNEE - 1-2 VIEW

[AP]
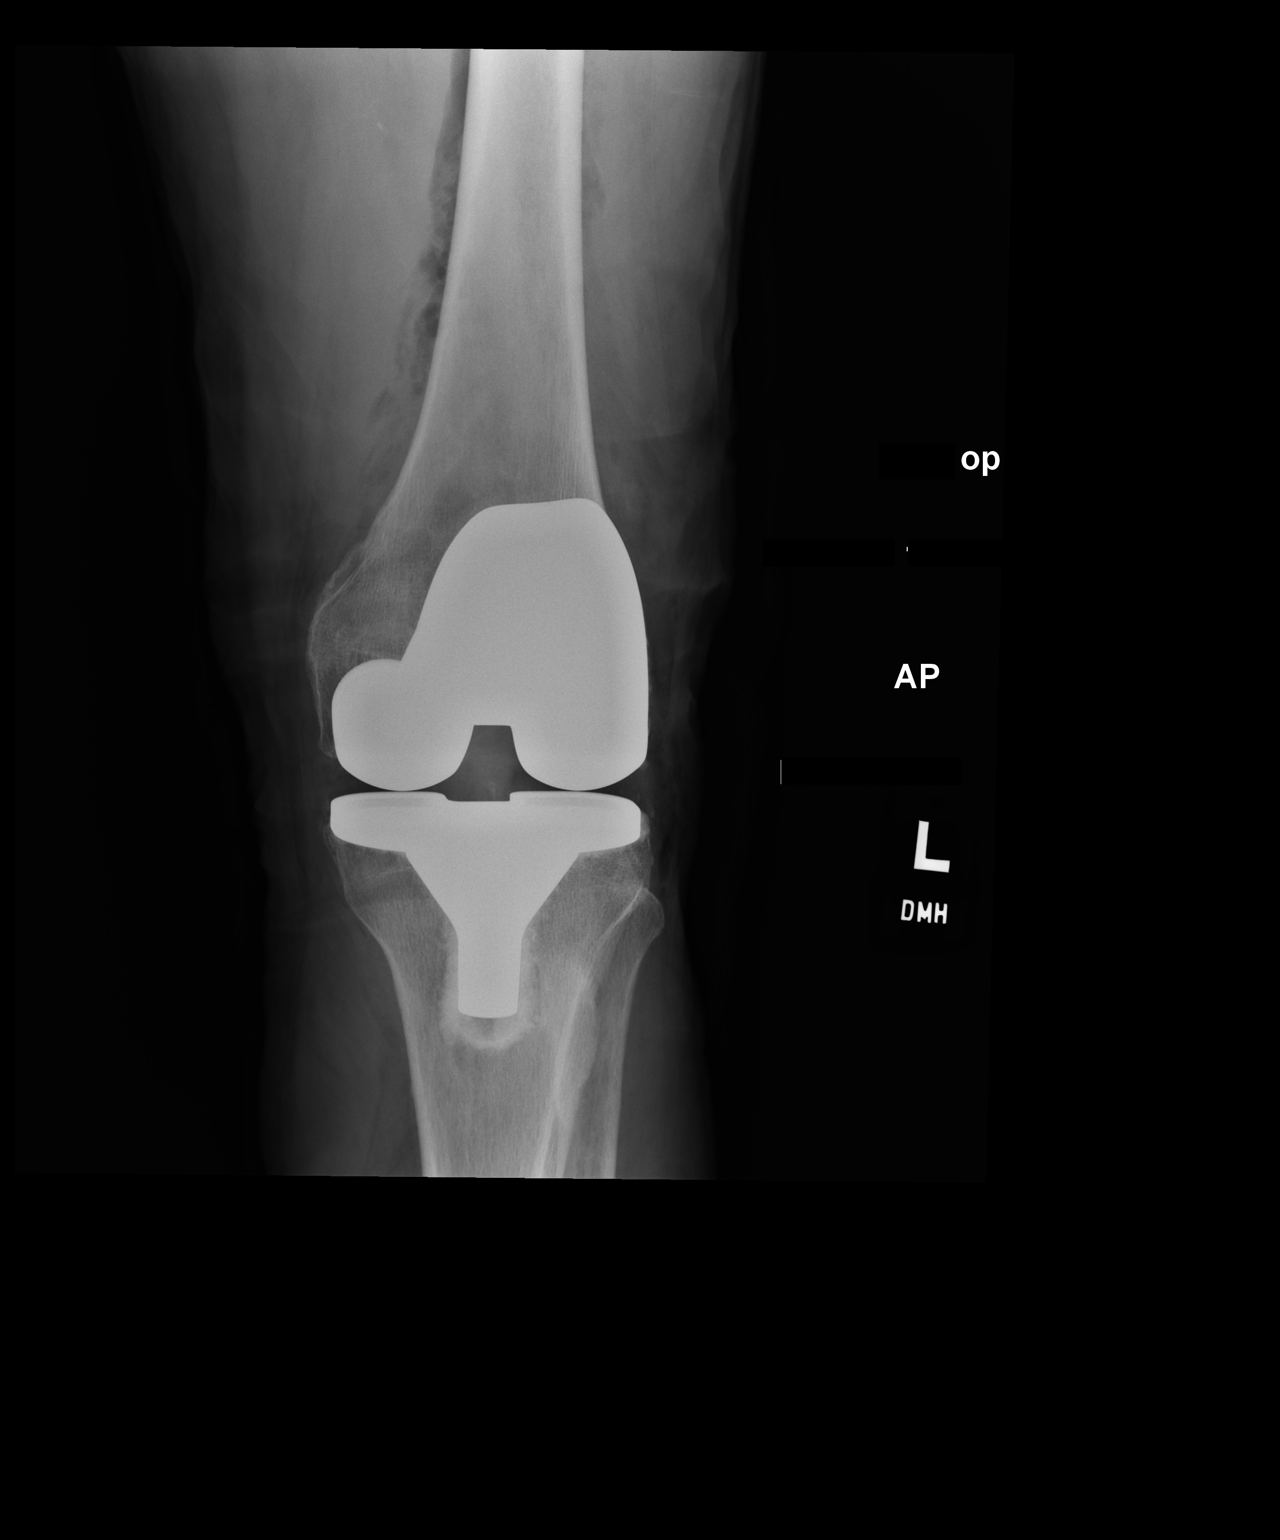

[xtable lateral]
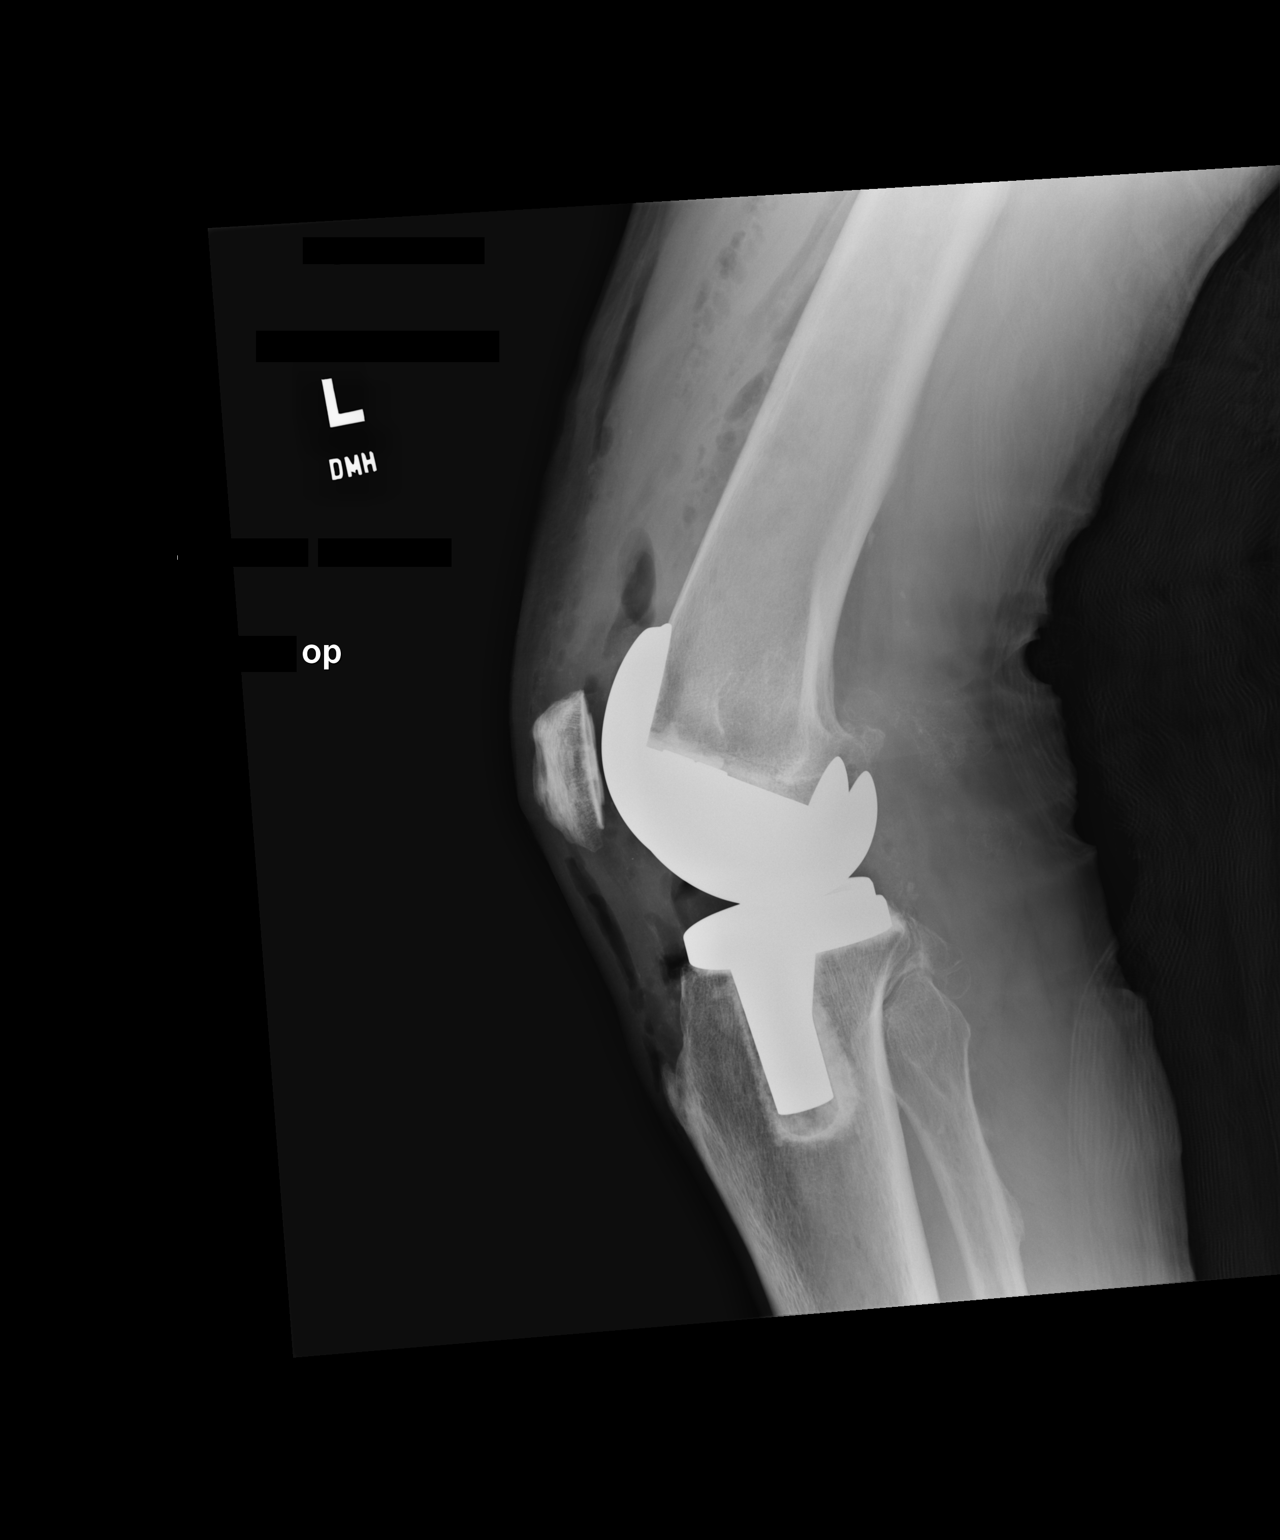

[2 of 2 positions shown; findings below may reference images not displayed]

FINDINGS: Patient status post total left knee replacement. Postop changes are
present. Good anatomic alignment is noted. No acute abnormality
identified.
IMPRESSION: Good anatomic alignment post total left knee replacement.

## 2016-01-30 NOTE — Telephone Encounter (Signed)
Wife call to say husband is still having bowel issues does he need an appt or does he need to see a GI.

## 2016-01-30 NOTE — Telephone Encounter (Signed)
He needs 3 month follow up anyway- have him see me and we can decide if needs GI visit

## 2016-02-02 NOTE — Telephone Encounter (Signed)
I left a detailed message with the information below at the pts wife's cell number and to call back for an appt.

## 2016-02-02 NOTE — Telephone Encounter (Addendum)
Wife wants pt to be seen as soon as possible due to him having this bowel issue. OK to work in this week? Nothing but same day. Wed open at 11:30?

## 2016-02-03 NOTE — Telephone Encounter (Signed)
Yes can use same day.

## 2016-02-03 NOTE — Telephone Encounter (Addendum)
Pt has been scheduled.  °

## 2016-02-05 ENCOUNTER — Ambulatory Visit: Payer: Medicare Other | Admitting: Family Medicine

## 2016-02-06 ENCOUNTER — Ambulatory Visit: Payer: Medicare Other | Admitting: Family Medicine

## 2016-02-10 ENCOUNTER — Encounter: Payer: Self-pay | Admitting: Family Medicine

## 2016-02-10 ENCOUNTER — Ambulatory Visit (INDEPENDENT_AMBULATORY_CARE_PROVIDER_SITE_OTHER): Payer: Medicare Other | Admitting: Family Medicine

## 2016-02-10 VITALS — BP 138/70 | HR 70 | Temp 98.4°F | Wt 178.0 lb

## 2016-02-10 DIAGNOSIS — I10 Essential (primary) hypertension: Secondary | ICD-10-CM

## 2016-02-10 DIAGNOSIS — K5900 Constipation, unspecified: Secondary | ICD-10-CM | POA: Diagnosis not present

## 2016-02-10 DIAGNOSIS — E785 Hyperlipidemia, unspecified: Secondary | ICD-10-CM

## 2016-02-10 DIAGNOSIS — E1151 Type 2 diabetes mellitus with diabetic peripheral angiopathy without gangrene: Secondary | ICD-10-CM | POA: Diagnosis not present

## 2016-02-10 NOTE — Assessment & Plan Note (Signed)
S: diagnoses based on elevated fasting cbg in 2016 with prior a1c 6.6. Despite this, has remained controlled without medication Lab Results  Component Value Date   HGBA1C 5.7* 10/17/2015  A/P: we will continue to follow but every 6 months is more than reasonable. No medication needed. Wife really pushes him to watch foods

## 2016-02-10 NOTE — Assessment & Plan Note (Addendum)
S:Constipated since 2nd knee surgery about 2 years ago. Has movement every other day. When it does come out it is like a hard baseball. Type II on bristol stool chart. When he used miralax twice a day it still did not loosen the stool. Prunes were working some- 3 at a time but that stopped working. Tried a suppository one time when didn't go to the bathroom for 3 days and it did help. Taking sennakot-S and that doesn't work everyday. Metamucil did not help.  A/P: patient has been distressed because he thought he "had to have a bowel movement daily". We had a long discussion that this is not a requirement. He is up to date on colonoscopy as of 2013 and consideration was to not repeat at that time- I doubt malignancy as cause of change in habits with no brbpr or melena. No pencil thin stools. Prunes seem to help the most. Multiple other trials have failed. He will continue prunes daily and try to increase water intake (only 18 oz a day and then some diet soda). Due to multiple failed trials we considered GI and we would refer if he has ongoing issues.

## 2016-02-10 NOTE — Patient Instructions (Addendum)
For constipation It is ok to have a bowel movement every other day or every 3rd day.  If much more than that- can cause issues at times  It is ok to trial the medicines you have been using I would also encourage the 3 prunes a day Drink at least 60 oz of water a day Regular exercise also helps- regular walking  Call me if this is not working for you and we can refer you to the stomach doctors  Everything else looks great  Let's follow up 4-6 months

## 2016-02-10 NOTE — Progress Notes (Signed)
Tanner Reddish, MD  Subjective:  Tanner Griffin is a 75 y.o. year old very pleasant male patient who presents for/with See problem oriented charting ROS- No chest pain or shortness of breath. No headache or blurry vision.   Past Medical History-  Patient Active Problem List   Diagnosis Date Noted  . Focal myoclonus 06/05/2014    Priority: High  . dm 2 02/12/2008    Priority: High  . BPH with obstruction/lower urinary tract symptoms 06/05/2012    Priority: Medium  . Hyperlipidemia 06/19/2007    Priority: Medium  . Essential hypertension 06/19/2007    Priority: Medium  . Constipation 01/20/2015    Priority: Low  . Osteoarthritis of knee s/p bilateral knee replacements 03/26/2014    Priority: Low  . Spongiotic dermatitis 03/19/2013    Priority: Low  . Personal history of colonic adenoma 10/30/2012    Priority: Low    Medications- reviewed and updated Current Outpatient Prescriptions  Medication Sig Dispense Refill  . amLODipine (NORVASC) 10 MG tablet TAKE 1 TABLET BY MOUTH DAILY 90 tablet 2  . aspirin EC 81 MG tablet Take 81 mg by mouth daily.    [name] atorvastatin (LIPITOR) 40 MG tablet TAKE 1 TABLET BY MOUTH EVERY DAY 90 tablet 2  . labetalol (NORMODYNE) 200 MG tablet TAKE 1 TABLET BY MOUTH TWICE DAILY 60 tablet 5  . meloxicam (MOBIC) 15 MG tablet Take 15 mg by mouth daily.    . Multiple Vitamin (MULTIVITAMIN) tablet Take 1 tablet by mouth daily.    . tamsulosin (FLOMAX) 0.4 MG CAPS capsule TAKE 1 CAPSULE BY MOUTH DAILY 90 capsule 3   No current facility-administered medications for this visit.    Objective: BP 138/70 mmHg  Pulse 70  Temp(Src) 98.4 F (36.9 C)  Wt 178 lb (80.74 kg) Gen: NAD, resting comfortably CV: RRR no murmurs rubs or gallops Lungs: CTAB no crackles, wheeze, rhonchi Abdomen: soft/nontender/nondistended/normal bowel sounds. No rebound or guarding.  Ext: no edema Skin: warm, dry, no rash Neuro: grossly normal, moves all  extremities  Assessment/Plan:  Constipation S:Constipated since 2nd knee surgery about 2 years ago. Has movement every other day. When it does come out it is like a hard baseball. Type II on bristol stool chart. When he used miralax twice a day it still did not loosen the stool. Prunes were working some- 3 at a time but that stopped working. Tried a suppository one time when didn't go to the bathroom for 3 days and it did help. Taking sennakot-S and that doesn't work everyday. Metamucil did not help.  A/P: patient has been distressed because he thought he "had to have a bowel movement daily". We had a long discussion that this is not a requirement. He is up to date on colonoscopy as of 2013 and consideration was to not repeat at that time- I doubt malignancy as cause of change in habits with no brbpr or melena. No pencil thin stools. Prunes seem to help the most. Multiple other trials have failed. He will continue prunes daily and try to increase water intake (only 18 oz a day and then some diet soda). Due to multiple failed trials we considered GI and we would refer if he has ongoing issues.    Essential hypertension S: controlled on Amlodipine 10mg , labetalol 200 BID BP Readings from Last 3 Encounters:  02/10/16 138/70  10/17/15 140/78  07/31/15 122/64  A/P: no changes with improved control today   dm 2 S: diagnoses based on  elevated fasting cbg in 2016 with prior a1c 6.6. Despite this, has remained controlled without medication Lab Results  Component Value Date   HGBA1C 5.7* 10/17/2015  A/P: we will continue to follow but every 6 months is more than reasonable. No medication needed. Wife really pushes him to watch foods   Hyperlipidemia S: reasonably controlled on atorvastatin 40mg . No myalgias.  Lab Results  Component Value Date   CHOL 143 01/20/2015   HDL 49.10 01/20/2015   LDLCALC 76 01/20/2015   LDLDIRECT 153.2 11/30/2013   TRIG 88.0 01/20/2015   CHOLHDL 3 01/20/2015   A/P:  continue current medication, doing well    4-6 months with sooner Return precautions advised.

## 2016-02-10 NOTE — Assessment & Plan Note (Signed)
S: controlled on Amlodipine 10mg , labetalol 200 BID BP Readings from Last 3 Encounters:  02/10/16 138/70  10/17/15 140/78  07/31/15 122/64  A/P: no changes with improved control today

## 2016-02-10 NOTE — Assessment & Plan Note (Signed)
S: reasonably controlled on atorvastatin 40mg . No myalgias.  Lab Results  Component Value Date   CHOL 143 01/20/2015   HDL 49.10 01/20/2015   LDLCALC 76 01/20/2015   LDLDIRECT 153.2 11/30/2013   TRIG 88.0 01/20/2015   CHOLHDL 3 01/20/2015   A/P: continue current medication, doing well

## 2016-03-03 ENCOUNTER — Other Ambulatory Visit: Payer: Self-pay | Admitting: Family Medicine

## 2016-04-05 ENCOUNTER — Telehealth: Payer: Self-pay | Admitting: Family Medicine

## 2016-04-05 NOTE — Telephone Encounter (Signed)
Yes thanks, may refer to orthopedics. Cannot recall specific joints but I know he has OA of knees so you can use that code

## 2016-04-05 NOTE — Telephone Encounter (Signed)
Pt states his arthritis is very bad and he can hardly get up or down. Pt states Dr Yong Channel states he would refer to a specialist if not better. Please advise.

## 2016-04-06 ENCOUNTER — Other Ambulatory Visit: Payer: Self-pay | Admitting: Family Medicine

## 2016-04-06 DIAGNOSIS — M1710 Unilateral primary osteoarthritis, unspecified knee: Secondary | ICD-10-CM

## 2016-04-06 DIAGNOSIS — M179 Osteoarthritis of knee, unspecified: Secondary | ICD-10-CM

## 2016-04-06 NOTE — Telephone Encounter (Signed)
Referral placed.

## 2016-04-21 ENCOUNTER — Ambulatory Visit: Payer: Medicare Other | Admitting: Family Medicine

## 2016-04-26 ENCOUNTER — Encounter: Payer: Self-pay | Admitting: Family Medicine

## 2016-04-26 ENCOUNTER — Other Ambulatory Visit (INDEPENDENT_AMBULATORY_CARE_PROVIDER_SITE_OTHER): Payer: Medicare Other

## 2016-04-26 ENCOUNTER — Ambulatory Visit (INDEPENDENT_AMBULATORY_CARE_PROVIDER_SITE_OTHER): Payer: Medicare Other | Admitting: Family Medicine

## 2016-04-26 ENCOUNTER — Ambulatory Visit (INDEPENDENT_AMBULATORY_CARE_PROVIDER_SITE_OTHER)
Admission: RE | Admit: 2016-04-26 | Discharge: 2016-04-26 | Disposition: A | Discharge status: Home / Self Care | Payer: Medicare Other | Source: Ambulatory Visit | Attending: Family Medicine | Admitting: Family Medicine

## 2016-04-26 VITALS — BP 140/80 | HR 63 | Ht 67.5 in | Wt 177.0 lb

## 2016-04-26 DIAGNOSIS — T8484XA Pain due to internal orthopedic prosthetic devices, implants and grafts, initial encounter: Secondary | ICD-10-CM

## 2016-04-26 DIAGNOSIS — M5416 Radiculopathy, lumbar region: Secondary | ICD-10-CM

## 2016-04-26 DIAGNOSIS — N138 Other obstructive and reflux uropathy: Secondary | ICD-10-CM

## 2016-04-26 DIAGNOSIS — N401 Enlarged prostate with lower urinary tract symptoms: Secondary | ICD-10-CM | POA: Diagnosis not present

## 2016-04-26 DIAGNOSIS — M25561 Pain in right knee: Secondary | ICD-10-CM | POA: Diagnosis not present

## 2016-04-26 DIAGNOSIS — M2550 Pain in unspecified joint: Secondary | ICD-10-CM

## 2016-04-26 DIAGNOSIS — Z96659 Presence of unspecified artificial knee joint: Secondary | ICD-10-CM

## 2016-04-26 LAB — VITAMIN D 25 HYDROXY (VIT D DEFICIENCY, FRACTURES): VITD: 32.88 ng/mL (ref 30.00–100.00)

## 2016-04-26 LAB — C-REACTIVE PROTEIN: CRP: 0.1 mg/dL — ABNORMAL LOW (ref 0.5–20.0)

## 2016-04-26 LAB — SEDIMENTATION RATE: SED RATE: 9 mm/h (ref 0–22)

## 2016-04-26 NOTE — Patient Instructions (Addendum)
Good to meet you  We will get xrays and labs today  Ice 20 minutes 2 times daily. Usually after activity and before bed. If knee looks unstable I will send you to Dr. Mayer Camel.  Vitamin D 2000 IU daily  Turmeric 500mg  daily  Tart cherry extract any dose at night  Tylenol 3 times daily scheduled Good shoes with rigid bottom.  Tanner Griffin, [place] or New balance greater then 700 Stay active, it is good for you  I will not get you pain free but hoping better See me again in 4 weeks.

## 2016-04-26 NOTE — Assessment & Plan Note (Signed)
Patient is some pain in multiple joints. I do think that this could be secondary to osteoarthritis. The most likely cause. Autoimmune diseases is within the differential with patient having very minimal DIP changes. I do believe the patient's back could be contributing and x-rays were ordered today as well. Patient and will come back and see me again in 4 weeks. Depending on laboratory workup if anything, packed positive will seek treatment.

## 2016-04-26 NOTE — Assessment & Plan Note (Signed)
Patient is having pain as well as instability of the right knee. X-rays ordered today but I do think the patient can possibly need a revision. Depending on results we will discuss further with patient.

## 2016-04-26 NOTE — Progress Notes (Signed)
Pre visit review using our clinic review tool, if applicable. No additional management support is needed unless otherwise documented below in the visit note. 

## 2016-04-26 NOTE — Progress Notes (Signed)
[facility] [address] [place], [place] [address] Phone: [phone] Subjective:    I'm seeing this patient by the request  of:  Garret Reddish, MD   CC: Arthritis multiple joints  [id number] Tanner Griffin is a 75 y.o. male coming in with complaint of arthritis. Patient does have pain in multiple joints. Does have bilateral knee replacements but states that when he has had on the right knee continues to give him severe pain. States that it's unstable and he feels like he is going to fall. Has to ambulate with the aid of a cane. Causing worsening back pain. States that the back pain seems to be focal but sometimes feels like causes weakness of his left leg. Denies any bowel or bladder incontinence. Patient has been losing weight as he is not eating as well secondary to pain he states. States that his hands can cause severe pain then he is unable to grasp certain things and is affecting daily activities. Patient states that it is chronic at this time and not responding to the anti-inflammatories. Once to avoid no significant medications possible. Patient would just like to feel better and be able to do more activities.     Past Medical History  Diagnosis Date  . HYPERLIPIDEMIA     takes Lipitor daily  . dm 2     pt denies dm  . Diabetes mellitus     not taking any med  . HYPERTENSION     takes Amlodipine and Labetalol daily  . Constipation     takes Miralax daily as needed  . Pneumonia     hx of-as a child  . Arthritis   . Joint pain   . Joint swelling   . Urinary frequency     takes Flomax daily  . Nocturia   . Arthrofibrosis of total knee arthroplasty, right 03/26/2014  . Osteoarthritis of left knee 03/26/2014   Past Surgical History  Procedure Laterality Date  . Total knee arthroplasty  2011    right, Dr. [name] per pt  . Total knee arthroplasty Left 03/26/2014    DR LANDAU  . Total knee arthroplasty Left 03/26/2014    Procedure: TOTAL  KNEE ARTHROPLASTY;  Surgeon: [name], MD;  Location: [facility];  Service: Orthopedics;  Laterality: Left;  . Knee closed reduction Right 03/26/2014    Procedure: CLOSED MANIPULATION KNEE;  Surgeon: [name], MD;  Location: [facility];  Service: Orthopedics;  Laterality: Right;  . Colonoscopy     Social History   Social History  . Marital Status: Married    Spouse Name: [name]  . Number of Children: 5  . Years of Education: 9th   Occupational History  . retired     Architect   Social History Main Topics  . Smoking status: Never Smoker   . Smokeless tobacco: Never Used  . Alcohol Use: No  . Drug Use: No  . Sexual Activity: Yes   Other Topics Concern  . None   Social History Narrative   Married, together 61 years in 2016. 1 son, 2 daughters. Can't count # grandkids at least 6, 33. 1 greatgranddaughter      Grandson and daughter live with them      Retired from Architect work      Hobbies: time with grand kids-play basketball   Allergies  Allergen Reactions  . Telmisartan Other (See Comments)    REACTION: headache   Family History  Problem  Relation Age of Onset  . Colon cancer Neg Hx   . Stomach cancer Neg Hx   . Esophageal cancer Neg Hx   . Rectal cancer Neg Hx   . Brain cancer Father   . Renal Disease Brother     ESRD, unknown cause    Past medical history, social, surgical and family history all reviewed in electronic medical record.  No pertanent information unless stated regarding to the chief complaint.   Review of Systems: No headache, visual changes, nausea, vomiting, diarrhea, constipation, dizziness, abdominal pain, skin rash, fevers, chills, night sweats, weight loss, swollen lymph nodes, body aches, joint swelling, muscle aches, chest pain, shortness of breath, mood changes.   Objective Blood pressure 140/80, pulse 63, height 5' 7.5" (1.715 m), weight 177 lb (80.287 kg), SpO2 98 %.  General: No apparent distress alert and oriented x3 mood and  affect normal, dressed appropriately.  HEENT: Pupils equal, extraocular movements intact  Respiratory: Patient's speak in full sentences and does not appear short of breath  Cardiovascular: No lower extremity edema, non tender, no erythema  Skin: Warm dry intact with no signs of infection or rash on extremities or on axial skeleton.  Abdomen: Soft nontender  Neuro: Cranial nerves II through XII are intact, neurovascularly intact in all extremities with 2+ DTRs and 2+ pulses.  Lymph: No lymphadenopathy of posterior or anterior cervical chain or axillae bilaterally.  Gait severely antalgic gait favoring the right knee MSK:  Non tender with full range of motion and good stability and symmetric strength and tone of shoulders, elbows, wrist, hip, knee and ankles bilaterally. Patient does have knee replacement bilaterally. Patient's right knee only has flexion to 65. Instability noted with valgus and varus force. Audible clicking noted with flexion. Causes patient pain. Arthritic changes of multiple joints. Patient's hands do have significant arthritic changes of the wrist as well as the PIPs but not as severe over the DIPs. Back Exam:  Inspection: . Tightness of the lower lumbar region  Motion: Flexion 25 deg, Extension 15 deg, Side Bending to 35 deg bilaterally,  Rotation to 25 deg bilaterally  SLR laying: positive on the left side and mild XSLR laying: Negative  Palpable tenderness: Tender mostly over the left sacroiliac joint FABER: Unable to do secondary to pain Sensory change: Gross sensation intact to all lumbar and sacral dermatomes.  Reflexes: 2+ at both patellar tendons, 2+ at achilles tendons, Babinski's downgoing.  Strength at foot  Plantar-flexion: 5/5 Dorsi-flexion: 5/5 Eversion: 5/5 Inversion: 5/5  Leg strength  Mild weakness on the left leg.4/5 compared to contralateral side    Impression and Recommendations:     This case required medical decision making of moderate  complexity.      Note: This dictation was prepared with Dragon dictation along with smaller phrase technology. Any transcriptional errors that result from this process are unintentional.

## 2016-04-27 LAB — PSA, TOTAL AND FREE
PSA FREE PCT: 18 % — AB (ref >25)
PSA, Free: 0.43 ng/mL
PSA: 2.42 ng/mL (ref <=4.00)

## 2016-04-27 LAB — ANTI-DNA ANTIBODY, DOUBLE-STRANDED

## 2016-04-27 LAB — CYCLIC CITRUL PEPTIDE ANTIBODY, IGG

## 2016-04-28 LAB — ANA: ANA: NEGATIVE

## 2016-09-02 ENCOUNTER — Other Ambulatory Visit: Payer: Self-pay | Admitting: Family Medicine

## 2016-10-21 ENCOUNTER — Ambulatory Visit (INDEPENDENT_AMBULATORY_CARE_PROVIDER_SITE_OTHER): Payer: Medicare Other

## 2016-10-21 DIAGNOSIS — Z23 Encounter for immunization: Secondary | ICD-10-CM

## 2016-10-31 ENCOUNTER — Other Ambulatory Visit: Payer: Self-pay | Admitting: Family Medicine

## 2016-11-09 ENCOUNTER — Ambulatory Visit (INDEPENDENT_AMBULATORY_CARE_PROVIDER_SITE_OTHER): Payer: Medicare Other | Admitting: Family Medicine

## 2016-11-09 ENCOUNTER — Encounter: Payer: Self-pay | Admitting: Family Medicine

## 2016-11-09 VITALS — BP 124/82 | HR 67 | Temp 98.2°F | Wt 176.6 lb

## 2016-11-09 DIAGNOSIS — E119 Type 2 diabetes mellitus without complications: Secondary | ICD-10-CM

## 2016-11-09 DIAGNOSIS — I10 Essential (primary) hypertension: Secondary | ICD-10-CM | POA: Diagnosis not present

## 2016-11-09 LAB — POCT GLYCOSYLATED HEMOGLOBIN (HGB A1C): Hemoglobin A1C: 5.6

## 2016-11-09 NOTE — Assessment & Plan Note (Signed)
S: remains very well controlled. On no medicine. 03/19/13 a1c 6.6 and later fasting CBG in 2016 of 138 led to diagnosis.  Lab Results  Component Value Date   HGBA1C 5.6 11/09/2016   HGBA1C 5.7 (H) 10/17/2015   HGBA1C 5.9 01/20/2015   A/P: very well controlled on current no medications. Continue to monitor every 6 months.

## 2016-11-09 NOTE — Progress Notes (Signed)
Pre visit review using our clinic review tool, if applicable. No additional management support is needed unless otherwise documented below in the visit note. 

## 2016-11-09 NOTE — Assessment & Plan Note (Signed)
S: controlled on Amlodipine 10mg , labetalol 200 BID BP Readings from Last 3 Encounters:  11/09/16 124/82  04/26/16 140/80  02/10/16 138/70  A/P:Continue current meds: doing well even per new guidelines

## 2016-11-09 NOTE — Patient Instructions (Signed)
Lab Results  Component Value Date   HGBA1C 5.7 (H) 10/17/2015  Diabetes looks great with no meds!   Blood pressure looks good.   I would also like for you to sign up for an annual wellness visit with our nurse, Manuela Schwartz, who specializes in the annual wellness exam perhaps in January- find a time that works for you and your wife. This is a free benefit under medicare that may help [place] find additional ways to help you. Some highlights are reviewing medications, lifestyle, and doing a dementia screen.   Then see me in 6 months in the morning- come fasting and we will update labs

## 2016-11-09 NOTE — Progress Notes (Signed)
Subjective:  Tanner Griffin is a 75 y.o. year old very pleasant male patient who presents for/with See problem oriented charting ROS- No chest pain or shortness of breath. No headache or blurry vision.  No hypoglycemia.see any ROS included in HPI as well.   Past Medical History-  Patient Active Problem List   Diagnosis Date Noted  . Focal myoclonus 06/05/2014    Priority: High  . dm 2 02/12/2008    Priority: High  . BPH with obstruction/lower urinary tract symptoms 06/05/2012    Priority: Medium  . Hyperlipidemia 06/19/2007    Priority: Medium  . Essential hypertension 06/19/2007    Priority: Medium  . Constipation 01/20/2015    Priority: Low  . Osteoarthritis of knee s/p bilateral knee replacements 03/26/2014    Priority: Low  . Spongiotic dermatitis 03/19/2013    Priority: Low  . Personal history of colonic adenoma 10/30/2012    Priority: Low  . Polyarthralgia 04/26/2016  . Pain due to total knee replacement ([facility]) 04/26/2016    Medications- reviewed and updated Current Outpatient Prescriptions  Medication Sig Dispense Refill  . amLODipine (NORVASC) 10 MG tablet TAKE 1 TABLET BY MOUTH DAILY 90 tablet 3  . aspirin EC 81 MG tablet Take 81 mg by mouth daily.    [name] atorvastatin (LIPITOR) 40 MG tablet TAKE 1 TABLET BY MOUTH EVERY DAY 90 tablet 2  . labetalol (NORMODYNE) 200 MG tablet TAKE 1 TABLET BY MOUTH TWICE DAILY 60 tablet 5  . meloxicam (MOBIC) 15 MG tablet Take 15 mg by mouth daily.    . Multiple Vitamin (MULTIVITAMIN) tablet Take 1 tablet by mouth daily.    . tamsulosin (FLOMAX) 0.4 MG CAPS capsule TAKE 1 CAPSULE BY MOUTH DAILY 90 capsule 1   No current facility-administered medications for this visit.     Objective: BP 124/82 (BP Location: Left Arm, Patient Position: Sitting, Cuff Size: Large)   Pulse 67   Temp 98.2 F (36.8 C) (Oral)   Wt 176 lb 9.6 oz (80.1 kg)   SpO2 98%   BMI 27.25 kg/m  Gen: NAD, resting comfortably CV: RRR no murmurs rubs or  gallops Lungs: CTAB no crackles, wheeze, rhonchi Ext: no edema Skin: warm, dry Neuro: walks with cane, right knee brace noted   Assessment/Plan:  Had scheduled appointment for right groin pain but resolved by time of visit.    dm 2 S: remains very well controlled. On no medicine. 03/19/13 a1c 6.6 and later fasting CBG in 2016 of 138 led to diagnosis.  Lab Results  Component Value Date   HGBA1C 5.6 11/09/2016   HGBA1C 5.7 (H) 10/17/2015   HGBA1C 5.9 01/20/2015   A/P: very well controlled on current no medications. Continue to monitor every 6 months.    Essential hypertension S: controlled on Amlodipine 10mg , labetalol 200 BID BP Readings from Last 3 Encounters:  11/09/16 124/82  04/26/16 140/80  02/10/16 138/70  A/P:Continue current meds: doing well even per new guidelines  AWV with susan within a few months to update health maintenance, then will see me in 6 months  Orders Placed This Encounter  Procedures  . POCT glycosylated hemoglobin (Hb A1C)   Return precautions advised.  Garret Reddish, MD

## 2016-11-26 ENCOUNTER — Other Ambulatory Visit: Payer: Self-pay

## 2016-12-02 ENCOUNTER — Ambulatory Visit: Payer: Medicare Other | Admitting: Family Medicine

## 2016-12-21 ENCOUNTER — Ambulatory Visit (INDEPENDENT_AMBULATORY_CARE_PROVIDER_SITE_OTHER): Payer: Medicare Other

## 2016-12-21 VITALS — BP 124/80 | HR 68 | Ht 67.0 in | Wt 175.0 lb

## 2016-12-21 DIAGNOSIS — Z0000 Encounter for general adult medical examination without abnormal findings: Secondary | ICD-10-CM

## 2016-12-21 NOTE — Progress Notes (Signed)
I have reviewed and agree with note, evaluation, plan. Patient has well controlled diabetes but has diabetes- have sent Manuela Schwartz a message for future visits that we do need to focus on these health maintenance topics. Will try to catch him up at next visit.   Garret Reddish, MD

## 2016-12-21 NOTE — Patient Instructions (Addendum)
[name] , Thank you for taking time to come for your Medicare Wellness Visit. I appreciate your ongoing commitment to your health goals. Please review the following plan we discussed and let me know if I can assist you in the future.   Will see Dr. Tamala Julian  about right knee; and exercises and to ask specifically what is wrong [facility] [phone] is the [facility] office number to make an apt  Eye exam will be scheduled   Will schedule hearing exam if needed  Wife given information on [facility] which will sometimes assist with hearing aid   I am giving you some information pre-diabetes but your labs have been normal the last year Educated regarding prediabetes and numbers;  A1c ranges from 5.8 to 6.5 or fasting Blood sugar > 115 -126; (126 is diabetic)   Risk: >45yo; family hx; overweight or obese; African American; Hispanic; Latino; American [place] American; [place]; history of diabetes when pregnant; or birth to a baby weighing over 9 lbs. Being less physically active than 30 minutes; 3 times a week;   Prevention; Losing a modest 7 to 8 lbs; If over 200 lbs; 10 to 14 lbs;  Choose healthier foods; colorful veggies; fish or lean meats; drinks water Reduce portion size Start exercising; 30 minutes of fast walking x 30 minutes per day/ 60 min for weight loss (exercise as we discussed )      These are the goals we discussed: Goals    . patient          Plans to get a new car in the future ; Will practice hip exercises; will try to remain safe when outside  Prevention of falls: Remove rugs or any tripping hazards in the home Use Non slip mats in bathtubs and showers Placing grab bars next to the toilet and or shower Placing handrails on both sides of the stair way Adding extra lighting in the home.   Personal safety issues reviewed:  1. Consider starting a community watch program per [facility] 2.  Changes batteries is smoke detector and/or carbon  monoxide detector  3.  If you have firearms; keep them in a safe place 4.  Wear protection when in the sun; Always wear sunscreen or a hat; It is good to have your doctor check your skin annually or review any new areas of concern 5. Driving safety; Keep in the right lane; stay 3 car lengths behind the car in front of you on the highway; look 3 times prior to pulling out; carry your cell phone everywhere you go!   Learn about the Yellow Dot program:  The program allows first responders at your emergency to have access to who your physician is, as well as your medications and medical conditions.  Citizens requesting the Yellow Dot Packages should contact Master Corporal Nunzio Cobbs at the [facility] [phone] for the first week of the program and beginning the week after Easter citizens should contact their Scientist, physiological.           This is a list of the screening recommended for you and due dates:  Health Maintenance  Topic Date Due  . Shingles Vaccine  04/02/2003  . Complete foot exam   10/16/2016  . Urine Protein Check  10/19/2016  . Eye exam for diabetics  11/18/2016  . Hemoglobin A1C  05/09/2017  . Tetanus Vaccine  07/04/2019  . Flu Shot  Completed  .  Pneumonia vaccines  Completed        Screening for Type 2 Diabetes A screening test for type 2 diabetes (type 2 diabetes mellitus) is a blood test to measure your blood sugar (glucose) level. This test is done to check for early signs of diabetes, before you develop symptoms. Type 2 diabetes is a long-term (chronic) disease that occurs when the pancreas does not make enough of a hormone called insulin. This results in high blood glucose levels, which can cause many complications. You may be screened for type 2 diabetes as part of your regular health care, especially if you have a high risk for diabetes. Screening can help identify type 2 diabetes at its early stage (prediabetes).  Identifying and treating prediabetes may delay or prevent development of type 2 diabetes. What are the risk factors for type 2 diabetes? The following factors may make you more likely to develop type 2 diabetes:  Having a parent or sibling (first-degree relative) who has diabetes.  Being overweight or obese.  Being of American-Indian, [place], Hispanic, Latino, Asian, or African-American descent.  Not getting enough exercise.  Being older than 72.  Having a history of diabetes during pregnancy (gestational diabetes).  Having low levels of good cholesterol (HDL-C) or high levels of blood fats (triglycerides).  Having high blood glucose in a previous blood test.  Having high blood pressure.  Having certain diseases or conditions, including:  Acanthosis nigricans. This is a condition that causes dark skin on the neck, armpits, and groin.  Polycystic ovary syndrome (PCOS).  Heart disease.  Having delivered a baby who weighed more than 9 lb (4.1 kg). Who should be screened for type 2 diabetes? Adults  Adults age 36 and older. These adults should be screened at least once every three years.  Adults who are younger than 42, overweight, and have at least one other risk factor. These adults should be screened at least once every three years.  Adults who have normal blood glucose levels and two or more risk factors. These adults may be screened once every year (annually).  Women who have had gestational diabetes in the past. These women should be screened at least once every three years.  Pregnant women who have risk factors. These women should be screened at their first prenatal visit.  Pregnant women with no risk factors. These women should be screened between weeks 24 and 28 of pregnancy. Children and adolescents  Children and adolescents should be screened for type 2 diabetes if they are overweight and have 2 of the following risk factors:  A family history of type  2 diabetes.  Being a member of a high risk race or ethnic group.  Signs of insulin resistance or conditions associated with insulin resistance.  A mother who had gestational diabetes while pregnant with him or her.  Screening should be done at least once every three years, starting at age 12. Your health care provider or your child's health care provider may recommend having a screening more or less often. What happens during screening? During screening, your health care provider may ask questions about:  Your health and your risk factors, including your activity level and any medical conditions that you have.  The health of your first-degree relatives.  Past pregnancies, if this applies. Your health care provider will also do a physical exam, including a blood pressure measurement and blood tests. There are four blood tests that can be used to screen for type 2 diabetes. You may  have one or more of the following:  A fasting plasma glucose test (FBG). You will not be allowed to eat for at least eight hours before a blood sample is taken.  A random blood glucose test. This test checks your blood glucose at any time of the day regardless of when you ate.  An oral glucose tolerance test (OGTT). This test measures your blood glucose at two times:  After you have not eaten (have fasted) overnight.  Two hours after you drink a glucose-containing beverage. A diagnosis can be made if the level is greater than 200 mg/dL (11.1 mmol/L).  An A1c test. This test provides information about blood glucose control over the previous three months. What do the results mean? Your test results are a measurement of how much glucose is in your blood. Normal blood glucose levels mean that you do not have diabetes or prediabetes. High blood glucose levels may mean that you have prediabetes or diabetes. Depending on the results, other tests may be needed to confirm the diagnosis. This information is not  intended to replace advice given to you by your health care provider. Make sure you discuss any questions you have with your health care provider. Document Released: 10/02/2009 Document Revised: 05/13/2016 Document Reviewed: 10/03/2015 Elsevier Interactive Patient Education  2017 [place] Prevention in the Home Introduction Falls can cause injuries. They can happen to people of all ages. There are many things you can do to make your home safe and to help prevent falls. What can I do on the outside of my home?  Regularly fix the edges of walkways and driveways and fix any cracks.  Remove anything that might make you trip as you walk through a door, such as a raised step or threshold.  Trim any bushes or trees on the path to your home.  Use bright outdoor lighting.  Clear any walking paths of anything that might make someone trip, such as rocks or tools.  Regularly check to see if handrails are loose or broken. Make sure that both sides of any steps have handrails.  Any raised decks and porches should have guardrails on the edges.  Have any leaves, snow, or ice cleared regularly.  Use sand or salt on walking paths during winter.  Clean up any spills in your garage right away. This includes oil or grease spills. What can I do in the bathroom?  Use night lights.  Install grab bars by the toilet and in the tub and shower. Do not use towel bars as grab bars.  Use non-skid mats or decals in the tub or shower.  If you need to sit down in the shower, use a plastic, non-slip stool.  Keep the floor dry. Clean up any water that spills on the floor as soon as it happens.  Remove soap buildup in the tub or shower regularly.  Attach bath mats securely with double-sided non-slip rug tape.  Do not have throw rugs and other things on the floor that can make you trip. What can I do in the bedroom?  Use night lights.  Make sure that you have a light by your bed that is easy  to reach.  Do not use any sheets or blankets that are too big for your bed. They should not hang down onto the floor.  Have a firm chair that has side arms. You can use this for support while you get dressed.  Do not have throw rugs and other  things on the floor that can make you trip. What can I do in the kitchen?  Clean up any spills right away.  Avoid walking on wet floors.  Keep items that you use a lot in easy-to-reach places.  If you need to reach something above you, use a strong step stool that has a grab bar.  Keep electrical cords out of the way.  Do not use floor polish or wax that makes floors slippery. If you must use wax, use non-skid floor wax.  Do not have throw rugs and other things on the floor that can make you trip. What can I do with my stairs?  Do not leave any items on the stairs.  Make sure that there are handrails on both sides of the stairs and use them. Fix handrails that are broken or loose. Make sure that handrails are as long as the stairways.  Check any carpeting to make sure that it is firmly attached to the stairs. Fix any carpet that is loose or worn.  Avoid having throw rugs at the top or bottom of the stairs. If you do have throw rugs, attach them to the floor with carpet tape.  Make sure that you have a light switch at the top of the stairs and the bottom of the stairs. If you do not have them, ask someone to add them for you. What else can I do to help prevent falls?  Wear shoes that:  Do not have high heels.  Have rubber bottoms.  Are comfortable and fit you well.  Are closed at the toe. Do not wear sandals.  If you use a stepladder:  Make sure that it is fully opened. Do not climb a closed stepladder.  Make sure that both sides of the stepladder are locked into place.  Ask someone to hold it for you, if possible.  Clearly mark and make sure that you can see:  Any grab bars or handrails.  First and last steps.  Where  the edge of each step is.  Use tools that help you move around (mobility aids) if they are needed. These include:  Canes.  Walkers.  Scooters.  Crutches.  Turn on the lights when you go into a dark area. Replace any light bulbs as soon as they burn out.  Set up your furniture so you have a clear path. Avoid moving your furniture around.  If any of your floors are uneven, fix them.  If there are any pets around you, be aware of where they are.  Review your medicines with your doctor. Some medicines can make you feel dizzy. This can increase your chance of falling. Ask your doctor what other things that you can do to help prevent falls. This information is not intended to replace advice given to you by your health care provider. Make sure you discuss any questions you have with your health care provider. Document Released: 10/02/2009 Document Revised: 05/13/2016 Document Reviewed: 01/10/2015  2017 Elsevier  Health Maintenance, Male A healthy lifestyle and preventative care can promote health and wellness.  Maintain regular health, dental, and eye exams.  Eat a healthy diet. Foods like vegetables, fruits, whole grains, low-fat dairy products, and lean protein foods contain the nutrients you need and are low in calories. Decrease your intake of foods high in solid fats, added sugars, and salt. Get information about a proper diet from your health care provider, if necessary.  Regular physical exercise is one of the most important  things you can do for your health. Most adults should get at least 150 minutes of moderate-intensity exercise (any activity that increases your heart rate and causes you to sweat) each week. In addition, most adults need muscle-strengthening exercises on 2 or more days a week.   Maintain a healthy weight. The body mass index (BMI) is a screening tool to identify possible weight problems. It provides an estimate of body fat based on height and weight. Your  health care provider can find your BMI and can help you achieve or maintain a healthy weight. For males 20 years and older:  A BMI below 18.5 is considered underweight.  A BMI of 18.5 to 24.9 is normal.  A BMI of 25 to 29.9 is considered overweight.  A BMI of 30 and above is considered obese.  Maintain normal blood lipids and cholesterol by exercising and minimizing your intake of saturated fat. Eat a balanced diet with plenty of fruits and vegetables. Blood tests for lipids and cholesterol should begin at age 42 and be repeated every 5 years. If your lipid or cholesterol levels are high, you are over age 3, or you are at high risk for heart disease, you may need your cholesterol levels checked more frequently.Ongoing high lipid and cholesterol levels should be treated with medicines if diet and exercise are not working.  If you smoke, find out from your health care provider how to quit. If you do not use tobacco, do not start.  Lung cancer screening is recommended for adults aged 74-80 years who are at high risk for developing lung cancer because of a history of smoking. A yearly low-dose CT scan of the lungs is recommended for people who have at least a 30-pack-year history of smoking and are current smokers or have quit within the past 15 years. A pack year of smoking is smoking an average of 1 pack of cigarettes a day for 1 year (for example, a 30-pack-year history of smoking could mean smoking 1 pack a day for 30 years or 2 packs a day for 15 years). Yearly screening should continue until the smoker has stopped smoking for at least 15 years. Yearly screening should be stopped for people who develop a health problem that would prevent them from having lung cancer treatment.  If you choose to drink alcohol, do not have more than 2 drinks per day. One drink is considered to be 12 oz (360 mL) of beer, 5 oz (150 mL) of wine, or 1.5 oz (45 mL) of liquor.  Avoid the use of street drugs. Do not  share needles with anyone. Ask for help if you need support or instructions about stopping the use of drugs.  High blood pressure causes heart disease and increases the risk of stroke. High blood pressure is more likely to develop in:  People who have blood pressure in the end of the normal range (100-139/85-89 mm Hg).  People who are overweight or obese.  People who are African American.  If you are 40-53 years of age, have your blood pressure checked every 3-5 years. If you are 41 years of age or older, have your blood pressure checked every year. You should have your blood pressure measured twice-once when you are at a hospital or clinic, and once when you are not at a hospital or clinic. Record the average of the two measurements. To check your blood pressure when you are not at a hospital or clinic, you can use:  An automated  blood pressure machine at a pharmacy.  A home blood pressure monitor.  If you are 67-11 years old, ask your health care provider if you should take aspirin to prevent heart disease.  Diabetes screening involves taking a blood sample to check your fasting blood sugar level. This should be done once every 3 years after age 26 if you are at a normal weight and without risk factors for diabetes. Testing should be considered at a younger age or be carried out more frequently if you are overweight and have at least 1 risk factor for diabetes.  Colorectal cancer can be detected and often prevented. Most routine colorectal cancer screening begins at the age of 74 and continues through age 68. However, your health care provider may recommend screening at an earlier age if you have risk factors for colon cancer. On a yearly basis, your health care provider may provide home test kits to check for hidden blood in the stool. A small camera at the end of a tube may be used to directly examine the colon (sigmoidoscopy or colonoscopy) to detect the earliest forms of colorectal cancer.  Talk to your health care provider about this at age 53 when routine screening begins. A direct exam of the colon should be repeated every 5-10 years through age 76, unless early forms of precancerous polyps or small growths are found.  People who are at an increased risk for hepatitis B should be screened for this virus. You are considered at high risk for hepatitis B if:  You were born in a country where hepatitis B occurs often. Talk with your health care provider about which countries are considered high risk.  Your parents were born in a high-risk country and you have not received a shot to protect against hepatitis B (hepatitis B vaccine).  You have HIV or AIDS.  You use needles to inject street drugs.  You live with, or have sex with, someone who has hepatitis B.  You are a man who has sex with other men (MSM).  You get hemodialysis treatment.  You take certain medicines for conditions like cancer, organ transplantation, and autoimmune conditions.  Hepatitis C blood testing is recommended for all people born from [birth year] through 1963 and any individual with known risk factors for hepatitis C.  Healthy men should no longer receive prostate-specific antigen (PSA) blood tests as part of routine cancer screening. Talk to your health care provider about prostate cancer screening.  Testicular cancer screening is not recommended for adolescents or adult males who have no symptoms. Screening includes self-exam, a health care provider exam, and other screening tests. Consult with your health care provider about any symptoms you have or any concerns you have about testicular cancer.  Practice safe sex. Use condoms and avoid high-risk sexual practices to reduce the spread of sexually transmitted infections (STIs).  You should be screened for STIs, including gonorrhea and chlamydia if:  You are sexually active and are younger than 24 years.  You are older than 24 years, and your health care  provider tells you that you are at risk for this type of infection.  Your sexual activity has changed since you were last screened, and you are at an increased risk for chlamydia or gonorrhea. Ask your health care provider if you are at risk.  If you are at risk of being infected with HIV, it is recommended that you take a prescription medicine daily to prevent HIV infection. This is called pre-exposure prophylaxis (PrEP).  You are considered at risk if:  You are a man who has sex with other men (MSM).  You are a heterosexual man who is sexually active with multiple partners.  You take drugs by injection.  You are sexually active with a partner who has HIV.  Talk with your health care provider about whether you are at high risk of being infected with HIV. If you choose to begin PrEP, you should first be tested for HIV. You should then be tested every 3 months for as long as you are taking PrEP.  Use sunscreen. Apply sunscreen liberally and repeatedly throughout the day. You should seek shade when your shadow is shorter than you. Protect yourself by wearing long sleeves, pants, a wide-brimmed hat, and sunglasses year round whenever you are outdoors.  Tell your health care provider of new moles or changes in moles, especially if there is a change in shape or color. Also, tell your health care provider if a mole is larger than the size of a pencil eraser.  A one-time screening for abdominal aortic aneurysm (AAA) and surgical repair of large AAAs by ultrasound is recommended for men aged 62-75 years who are current or former smokers.  Stay current with your vaccines (immunizations). This information is not intended to replace advice given to you by your health care provider. Make sure you discuss any questions you have with your health care provider. Document Released: 06/03/2008 Document Revised: 12/27/2014 Document Reviewed: 09/09/2015 Elsevier Interactive Patient Education  2017 [employer].
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
## 2016-12-24 ENCOUNTER — Encounter: Payer: Self-pay | Admitting: Family Medicine

## 2017-01-10 NOTE — Progress Notes (Signed)
[facility] [address], [place] [address] Phone: [phone] Subjective:      CC: Arthritis multiple joints f/u  [id number]  Tanner Griffin is a 76 y.o. male coming in with complaint of arthritis. Patient does have pain in multiple joints. Does have bilateral knee replacements but states that when he has had on the right knee continues to give him severe pain. Patient is having increasing instability of the knee. Patient was last seen by me a months ago. Patient was to try some over-the-counter medications as well as proper shoes. Patient was lost to follow-up. Patient states  patient did have laboratory workup at last follow-up showing patient's autoimmune workup was unremarkable with no signs of inflammatory markers. Patient states He continues to have significant amount of pain as well as instability. Doesn't like it is worsening over the course of time.  Patient is also complaining of low back pain. Hasn't known arthritic changes of the neck. An states that it seems to stay mostly localized but does have some radiation into the buttocks bilaterally. States that because he feels he has compensating for his knee potentially that's making the back worse. Denies any new injuries recently. Mild weakness in the legs.   X-rays the patient's knee 04/26/2016 was independently visualized by me. Patient's did have what appeared to be loose bodies in the posterior knee joint. Patient has had an MRI of the lumbar spine back in 2015. Patient does have bilateral severe foraminal stenosis at L5-S1.  Past Medical History:  Diagnosis Date  . Arthritis   . Arthrofibrosis of total knee arthroplasty, right 03/26/2014  . Constipation    takes Miralax daily as needed  . Diabetes mellitus    not taking any med  . dm 2    pt denies dm  . HYPERLIPIDEMIA    takes Lipitor daily  . HYPERTENSION    takes Amlodipine and Labetalol daily  . Joint pain   . Joint  swelling   . Nocturia   . Osteoarthritis of left knee 03/26/2014  . Pneumonia    hx of-as a child  . Urinary frequency    takes Flomax daily   Past Surgical History:  Procedure Laterality Date  . COLONOSCOPY    . KNEE CLOSED REDUCTION Right 03/26/2014   Procedure: CLOSED MANIPULATION KNEE;  Surgeon: [name], MD;  Location: [facility];  Service: Orthopedics;  Laterality: Right;  . TOTAL KNEE ARTHROPLASTY  2011   right, Dr. [name] per pt  . TOTAL KNEE ARTHROPLASTY Left 03/26/2014   DR LANDAU  . TOTAL KNEE ARTHROPLASTY Left 03/26/2014   Procedure: TOTAL KNEE ARTHROPLASTY;  Surgeon: [name], MD;  Location: [facility];  Service: Orthopedics;  Laterality: Left;   Social History   Social History  . Marital status: Married    Spouse name: Mary  . Number of children: 5  . Years of education: 9th   Occupational History  . retired     Architect   Social History Main Topics  . Smoking status: Never Smoker  . Smokeless tobacco: Never Used  . Alcohol use No  . Drug use: No  . Sexual activity: Yes   Other Topics Concern  . None   Social History Narrative   Married, together 29 years in 2016. 1 son, 2 daughters. Can't count # grandkids at least 74, 62. 1 greatgranddaughter      Grandson and daughter live with them  Retired from Architect work      Hobbies: time with grand kids-play basketball   Allergies  Allergen Reactions  . Telmisartan Other (See Comments)    REACTION: headache   Family History  Problem Relation Age of Onset  . Brain cancer Father   . Renal Disease Brother     ESRD, unknown cause  . Colon cancer Neg Hx   . Stomach cancer Neg Hx   . Esophageal cancer Neg Hx   . Rectal cancer Neg Hx     Past medical history, social, surgical and family history all reviewed in electronic medical record.  No pertanent information unless stated regarding to the chief complaint.   Review of Systems: No headache, visual changes, nausea, vomiting, diarrhea,  constipation, dizziness, abdominal pain, Patient was positive for joint pains, joint swelling, body aches.  Objective  Blood pressure 138/82, pulse 66, height 5\' 8"  (1.727 m), weight 179 lb 9.6 oz (81.5 kg).  Systems examined below as of 01/11/17 General: NAD A&O x3 mood, affect normal  HEENT: Pupils equal, extraocular movements intact no nystagmus Respiratory: not short of breath at rest or with speaking Cardiovascular: Trace lower extremity edema, non tender Skin: Warm dry intact with no signs of infection or rash on extremities or on axial skeleton. Abdomen: Soft nontender, no masses Neuro: Cranial nerves  intact, neurovascularly intact in all extremities with 2+ DTRs and 2+ pulses. Lymph: No lymphadenopathy appreciated today   Gait severely antalgic gait favoring the right knee MSK:  Non tender with full range of motion and good stability and symmetric strength and tone of shoulders, elbows, wrist, hip, knee and ankles bilaterally. Patient does have knee replacement bilaterally. Patient's right knee only has flexion to 65. Instability noted with valgus and varus force. Possibly worsen previous exam with increasing audible clicking. Arthritic changes of multiple joints.  Back Exam:  Inspection: . Tightness of the lower lumbar region  Motion: Flexion 20 deg, Extension 15 deg, Side Bending to 35 deg bilaterally,  Rotation to 25 deg bilaterally  SLR laying: positive on the left side and mild still present. XSLR laying: Negative  Palpable tenderness: Tender to palpation still over the paraspinal musculature as well as over the sacroiliac joints bilaterally FABER: Unable to do secondary to pain Sensory change: Gross sensation intact to all lumbar and sacral dermatomes.  Reflexes: 2+ at both patellar tendons, 2+ at achilles tendons, Babinski's downgoing.  Strength at foot  Weakness of 4 out of 5 strength on the left leg compared to the right leg. Patient's right leg though now is 4+ out of 5  strength.    Impression and Recommendations:     This case required medical decision making of moderate complexity.      Note: This dictation was prepared with Dragon dictation along with smaller phrase technology. Any transcriptional errors that result from this process are unintentional.

## 2017-01-11 ENCOUNTER — Encounter: Payer: Self-pay | Admitting: Family Medicine

## 2017-01-11 ENCOUNTER — Ambulatory Visit (INDEPENDENT_AMBULATORY_CARE_PROVIDER_SITE_OTHER): Payer: Medicare Other | Admitting: Family Medicine

## 2017-01-11 DIAGNOSIS — T8484XD Pain due to internal orthopedic prosthetic devices, implants and grafts, subsequent encounter: Secondary | ICD-10-CM

## 2017-01-11 DIAGNOSIS — Z96659 Presence of unspecified artificial knee joint: Secondary | ICD-10-CM

## 2017-01-11 DIAGNOSIS — M5136 Other intervertebral disc degeneration, lumbar region: Secondary | ICD-10-CM

## 2017-01-11 NOTE — Patient Instructions (Addendum)
Good to see you  We will wait on the back until we see the knee.  We will get a brace for you and they will call you  Ice can help Keep wearing your brace for now.  After you get the brace I want to see you again in 2 weeks and make sure you are making progress or we will look at your back

## 2017-01-11 NOTE — Assessment & Plan Note (Signed)
She continues to have instability of the right knee. X-rays do show that there are potential loose bodies. We discussed that likely he needs revision. Patient has declined any referral to an orthopedic specialist for this. At this time we will see if patient will do well with the brace. I think that he is a candidate for this in the custom brace could help with some stability. We discussed the icing as well as compression to help with any type of the swelling. We discussed icing regimen. Patient will come back and see me again in 2 weeks after having the brace and we will discuss what further intervention may be necessary.

## 2017-01-11 NOTE — Assessment & Plan Note (Signed)
Patient does have more of a degenerative disc disease of the lumbar spine severely at L5-S1 that likely has bilateral foraminal stenosis causing impingement. I do think that this is likely contributed to most of his back pain. Does have oral anti-inflammatories. We discussed icing regimen. Patient has still radicular symptoms on the worsening weakness of the legs will consider potentially advance imaging.

## 2017-02-14 ENCOUNTER — Telehealth: Payer: Self-pay | Admitting: Family Medicine

## 2017-02-14 NOTE — Telephone Encounter (Signed)
error 

## 2017-02-14 NOTE — Telephone Encounter (Signed)
Patients wife called and said they have had the leg brace for the R leg for 2 weeks. And they have called up here and they don't know what they need to do about this. They do not know how to put it on or anything. Please follow up with her. I am unsure who she has spoke with I have no notes in the computer that she has called. Thank you.

## 2017-02-14 NOTE — Telephone Encounter (Signed)
Spoke to pt's wife & made her aware that Matt from [facility] will be calling her to schedule an appt.

## 2017-02-22 ENCOUNTER — Telehealth: Payer: Self-pay | Admitting: Family Medicine

## 2017-02-22 NOTE — Telephone Encounter (Signed)
Matt with [facility] will contact patient.

## 2017-02-22 NOTE — Telephone Encounter (Signed)
States patient is suppose to be meeting Matt but does not know when.  Thinks may be tomorrow.  Would like to know if you could follow up in regard.

## 2017-03-09 ENCOUNTER — Telehealth: Payer: Self-pay | Admitting: Family Medicine

## 2017-03-09 NOTE — Telephone Encounter (Signed)
Patients spouse requesting call back.  States patient was fitted with a brace.  Now they are home and having trouble.  Requesting to meet with [facility] again.

## 2017-03-10 NOTE — Telephone Encounter (Signed)
Catalina [facility] is in contact with the patient.

## 2017-03-11 ENCOUNTER — Other Ambulatory Visit: Payer: Self-pay | Admitting: Family Medicine

## 2017-03-14 ENCOUNTER — Encounter: Payer: Self-pay | Admitting: Family Medicine

## 2017-03-14 ENCOUNTER — Ambulatory Visit (INDEPENDENT_AMBULATORY_CARE_PROVIDER_SITE_OTHER): Payer: Medicare Other | Admitting: Family Medicine

## 2017-03-14 VITALS — BP 122/60 | HR 76 | Temp 98.4°F | Ht 68.0 in | Wt 174.5 lb

## 2017-03-14 DIAGNOSIS — K4090 Unilateral inguinal hernia, without obstruction or gangrene, not specified as recurrent: Secondary | ICD-10-CM | POA: Diagnosis not present

## 2017-03-14 NOTE — Progress Notes (Signed)
Subjective:  Tanner Griffin is a 76 y.o. year old very pleasant male patient who presents for/with See problem oriented charting ROS- No chest pain or shortness of breath. No headache or blurry vision. Bulge in right groin noted.    Past Medical History-  Patient Active Problem List   Diagnosis Date Noted  . Focal myoclonus 06/05/2014    Priority: High  . Type II diabetes mellitus, well controlled ([place]) 02/12/2008    Priority: High  . BPH with obstruction/lower urinary tract symptoms 06/05/2012    Priority: Medium  . Hyperlipidemia 06/19/2007    Priority: Medium  . Essential hypertension 06/19/2007    Priority: Medium  . Constipation 01/20/2015    Priority: Low  . Osteoarthritis of knee s/p bilateral knee replacements 03/26/2014    Priority: Low  . Spongiotic dermatitis 03/19/2013    Priority: Low  . Personal history of colonic adenoma 10/30/2012    Priority: Low  . Right inguinal hernia 03/14/2017  . Degenerative disc disease, lumbar 01/11/2017  . Polyarthralgia 04/26/2016  . Pain due to total knee replacement ([place]) 04/26/2016   Medications- reviewed and updated Current Outpatient Prescriptions  Medication Sig Dispense Refill  . amLODipine (NORVASC) 10 MG tablet TAKE 1 TABLET BY MOUTH DAILY 90 tablet 1  . aspirin EC 81 MG tablet Take 81 mg by mouth daily.    [name] atorvastatin (LIPITOR) 40 MG tablet TAKE 1 TABLET BY MOUTH EVERY DAY 90 tablet 2  . labetalol (NORMODYNE) 200 MG tablet TAKE 1 TABLET BY MOUTH TWICE DAILY 60 tablet 5  . meloxicam (MOBIC) 15 MG tablet Take 15 mg by mouth daily.    . Multiple Vitamin (MULTIVITAMIN) tablet Take 1 tablet by mouth daily.    . tamsulosin (FLOMAX) 0.4 MG CAPS capsule TAKE 1 CAPSULE BY MOUTH DAILY 90 capsule 1   Objective: BP 122/60 (BP Location: Left Arm, Patient Position: Sitting, Cuff Size: Normal)   Pulse 76   Temp 98.4 F (36.9 C) (Oral)   Ht 5\' 8"  (1.727 m)   Wt 174 lb 8 oz (79.2 kg)   SpO2 98%   BMI 26.53 kg/m  Gen: NAD,  resting comfortably CV: RRR no murmurs rubs or gallops Lungs: CTAB no crackles, wheeze, rhonchi Abdomen: soft/nontender/nondistended/normal bowel sounds. No rebound or guarding.  Groin: no bulge in left side, on right side there is a focal bulge felt with valsalva- reducible when patient lies down.  Ext: no edema Skin: warm, dry, no rash  Assessment/Plan:  Right inguinal hernia - Plan: Ambulatory referral to General Surgery S: right sided groin enlargement noted for 2-3 days at least but likely lower but didn't pay as much attention to it. Denies pain or discomfort. If he lays down seems to improve. Sometimes the bulge just goes back in on its own. Never had any issue like this before. No treatments tried other than laying down. 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  A/P: bulge felt in groin with valsalva- suspicious for hernia. No pain. We discussed watchful waiting option with precautions about worsening or pain- he would prefer to get surgical consult and this was placed today.   He tells me he has no chest pain or shortness of breath with lifting heavy objects or going up a flight of stairs. Excellent a1c control. Would not need further cardiac clearance if surgery opted for Lab Results  Component Value Date   HGBA1C 5.6 11/09/2016   Orders Placed This Encounter  Procedures  .  Ambulatory referral to General Surgery    Referral Priority:   Routine    Referral Type:   Surgical    Referral Reason:   Specialty Services Required    Requested Specialty:   General Surgery    Number of Visits Requested:   1   Return precautions advised.  Garret Reddish, MD

## 2017-03-14 NOTE — Patient Instructions (Signed)
We will call you within a week or two about your referral to surgery. If you do not hear within 3 weeks, give [place] a call.     Inguinal Hernia, Adult An inguinal hernia is when fat or the intestines push through the area where the leg meets the lower belly (groin) and make a rounded lump (bulge). This condition happens over time. There are three types of inguinal hernias. These types include:  Hernias that can be pushed back into the belly (are reducible).  Hernias that cannot be pushed back into the belly (are incarcerated).  Hernias that cannot be pushed back into the belly and lose their blood supply (get strangulated). This type needs emergency surgery. Follow these instructions at home: Lifestyle   Drink enough fluid to keep your urine (pee) clear or pale yellow.  Eat plenty of fruits, vegetables, and whole grains. These have a lot of fiber. Talk with your doctor if you have questions.  Avoid lifting heavy objects.  Avoid standing for long periods of time.  Do not use tobacco products. These include cigarettes, chewing tobacco, or e-cigarettes. If you need help quitting, ask your doctor.  Try to stay at a healthy weight. General instructions   Do not try to force the hernia back in.  Watch your hernia for any changes in color or size. Let your doctor know if there are any changes.  Take over-the-counter and prescription medicines only as told by your doctor.  Keep all follow-up visits as told by your doctor. This is important. Contact a doctor if:  You have a fever.  You have new symptoms.  Your symptoms get worse. Get help right away if:  The area where the legs meets the lower belly has:  Pain that gets worse suddenly.  A bulge that gets bigger suddenly and does not go down.  A bulge that turns red or purple.  A bulge that is painful to the touch.  You are a man and your scrotum:  Suddenly feels painful.  Suddenly changes in size.  You feel sick to  your stomach (nauseous) and this feeling does not go away.  You throw up (vomit) and this keeps happening.  You feel your heart beating a lot more quickly than normal.  You cannot poop (have a bowel movement) or pass gas. This information is not intended to replace advice given to you by your health care provider. Make sure you discuss any questions you have with your health care provider. Document Released: 01/06/2007 Document Revised: 05/13/2016 Document Reviewed: 10/16/2014 Elsevier Interactive Patient Education  2017 [employer].

## 2017-03-29 ENCOUNTER — Ambulatory Visit: Payer: Self-pay | Admitting: Surgery

## 2017-03-29 DIAGNOSIS — K4090 Unilateral inguinal hernia, without obstruction or gangrene, not specified as recurrent: Secondary | ICD-10-CM | POA: Diagnosis not present

## 2017-04-06 ENCOUNTER — Encounter (HOSPITAL_COMMUNITY): Payer: Self-pay

## 2017-04-06 NOTE — Patient Instructions (Addendum)
KATAI MARSICO  04/06/2017   Your procedure is scheduled on: 04/14/17  Report to [facility] Main  Entrance Take [place]  elevators to 3rd floor to  [place] at     [phone].    Call this number if you have problems the morning of surgery [phone]    Remember: ONLY 1 PERSON MAY GO WITH YOU TO SHORT STAY TO GET  READY MORNING OF YOUR SURGERY.  Do not eat food or drink liquids :After Midnight.     Take these medicines the morning of surgery with A SIP OF WATER: amlodipine , atorvastatin, tamulosin                                You may not have any metal on your body including hair pins and jewelry              Lotions , powders, perfumes, or deodorant           .              Men may shave face and neck.   Do not bring valuables to the hospital. [place].  Contacts, dentures or bridgework may not be worn into surgery.      Patients discharged the day of surgery will not be allowed to drive home.  Name and phone number of your driver:  Special Instructions: N/A              Please read over the following fact sheets you were given: _____________________________________________________________________             [facility] - Preparing for Surgery Before surgery, you can play an important role.  Because skin is not sterile, your skin needs to be as free of germs as possible.  You can reduce the number of germs on your skin by washing with CHG (chlorahexidine gluconate) soap before surgery.  CHG is an antiseptic cleaner which kills germs and bonds with the skin to continue killing germs even after washing. Please DO NOT use if you have an allergy to CHG or antibacterial soaps.  If your skin becomes reddened/irritated stop using the CHG and inform your nurse when you arrive at Short Stay. Do not shave (including legs and underarms) for at least 48 hours prior to the first CHG shower.  You may shave  your face/neck. Please follow these instructions carefully:  1.  Shower with CHG Soap the night before surgery and the  morning of Surgery.  2.  If you choose to wash your hair, wash your hair first as usual with your  normal  shampoo.  3.  After you shampoo, rinse your hair and body thoroughly to remove the  shampoo.                           4.  Use CHG as you would any other liquid soap.  You can apply chg directly  to the skin and wash                       Gently with a scrungie or clean washcloth.  5.  Apply the CHG Soap to  your body ONLY FROM THE NECK DOWN.   Do not use on face/ open                           Wound or open sores. Avoid contact with eyes, ears mouth and genitals (private parts).                       Wash face,  Genitals (private parts) with your normal soap.             6.  Wash thoroughly, paying special attention to the area where your surgery  will be performed.  7.  Thoroughly rinse your body with warm water from the neck down.  8.  DO NOT shower/wash with your normal soap after using and rinsing off  the CHG Soap.                9.  Pat yourself dry with a clean towel.            10.  Wear clean pajamas.            11.  Place clean sheets on your bed the night of your first shower and do not  sleep with pets. Day of Surgery : Do not apply any lotions/deodorants the morning of surgery.  Please wear clean clothes to the hospital/surgery center.  FAILURE TO FOLLOW THESE INSTRUCTIONS MAY RESULT IN THE CANCELLATION OF YOUR SURGERY PATIENT SIGNATURE_________________________________  NURSE SIGNATURE__________________________________  ________________________________________________________________________

## 2017-04-11 ENCOUNTER — Encounter (HOSPITAL_COMMUNITY)
Admission: RE | Admit: 2017-04-11 | Discharge: 2017-04-11 | Disposition: A | Discharge status: Home / Self Care | Payer: Medicare Other | Source: Ambulatory Visit | Attending: Surgery | Admitting: Surgery

## 2017-04-11 ENCOUNTER — Encounter (HOSPITAL_COMMUNITY): Payer: Self-pay

## 2017-04-11 DIAGNOSIS — M1990 Unspecified osteoarthritis, unspecified site: Secondary | ICD-10-CM | POA: Diagnosis not present

## 2017-04-11 DIAGNOSIS — I10 Essential (primary) hypertension: Secondary | ICD-10-CM | POA: Diagnosis not present

## 2017-04-11 DIAGNOSIS — E119 Type 2 diabetes mellitus without complications: Secondary | ICD-10-CM | POA: Diagnosis not present

## 2017-04-11 DIAGNOSIS — K4090 Unilateral inguinal hernia, without obstruction or gangrene, not specified as recurrent: Secondary | ICD-10-CM | POA: Diagnosis not present

## 2017-04-11 DIAGNOSIS — Z01810 Encounter for preprocedural cardiovascular examination: Secondary | ICD-10-CM | POA: Insufficient documentation

## 2017-04-11 DIAGNOSIS — R9431 Abnormal electrocardiogram [ECG] [EKG]: Secondary | ICD-10-CM | POA: Insufficient documentation

## 2017-04-11 DIAGNOSIS — Z01818 Encounter for other preprocedural examination: Secondary | ICD-10-CM | POA: Insufficient documentation

## 2017-04-11 DIAGNOSIS — Z7982 Long term (current) use of aspirin: Secondary | ICD-10-CM | POA: Diagnosis not present

## 2017-04-11 DIAGNOSIS — K5909 Other constipation: Secondary | ICD-10-CM | POA: Diagnosis not present

## 2017-04-11 DIAGNOSIS — I440 Atrioventricular block, first degree: Secondary | ICD-10-CM | POA: Insufficient documentation

## 2017-04-11 DIAGNOSIS — E785 Hyperlipidemia, unspecified: Secondary | ICD-10-CM | POA: Diagnosis not present

## 2017-04-11 DIAGNOSIS — Z79899 Other long term (current) drug therapy: Secondary | ICD-10-CM | POA: Diagnosis not present

## 2017-04-11 HISTORY — DX: Headache, unspecified: R51.9

## 2017-04-11 HISTORY — DX: Headache: R51

## 2017-04-11 LAB — BASIC METABOLIC PANEL
Anion gap: 5 (ref 5–15)
BUN: 15 mg/dL (ref 6–20)
CHLORIDE: 109 mmol/L (ref 101–111)
CO2: 29 mmol/L (ref 22–32)
CREATININE: 0.85 mg/dL (ref 0.61–1.24)
Calcium: 9.4 mg/dL (ref 8.9–10.3)
Glucose, Bld: 137 mg/dL — ABNORMAL HIGH (ref 65–99)
POTASSIUM: 4.3 mmol/L (ref 3.5–5.1)
SODIUM: 143 mmol/L (ref 135–145)

## 2017-04-11 LAB — CBC
HEMATOCRIT: 39.1 % (ref 39.0–52.0)
Hemoglobin: 12.8 g/dL — ABNORMAL LOW (ref 13.0–17.0)
MCH: 31.4 pg (ref 26.0–34.0)
MCHC: 32.7 g/dL (ref 30.0–36.0)
MCV: 95.8 fL (ref 78.0–100.0)
PLATELETS: 263 10*3/uL (ref 150–400)
RBC: 4.08 MIL/uL — AB (ref 4.22–5.81)
RDW: 12.8 % (ref 11.5–15.5)
WBC: 5 10*3/uL (ref 4.0–10.5)

## 2017-04-13 ENCOUNTER — Encounter (HOSPITAL_COMMUNITY): Payer: Self-pay | Admitting: Surgery

## 2017-04-13 NOTE — H&P (Signed)
General [facility]  NATTHEW MARLATT 03/29/2017 1:40 PM Location: [facility] Patient #: [id number] DOB: [birth year] Divorced / Language: Cleophus Molt / Race: Black or African American Male   History of Present Illness Earnstine Regal MD; 03/29/2017 2:05 PM) The patient is a 76 year old male who presents with an inguinal hernia. Chief Complaint: right inguinal hernia  Patient is referred by Dr. Garret Reddish for evaluation and management of right inguinal hernia. Patient first noted this hernia little over a month ago. It has gradually become larger. He has problems with chronic constipation. Patient does do a fair amount of heavy lifting. He has had no prior hernia repairs. He has had no other abdominal surgery. He denies any symptoms of intestinal obstruction. He denies any significant pain in the inguinal region. He presents today to discuss repair.   Past Surgical History Mammie Lorenzo, LPN; 02/02/864 7:84 PM) Knee Surgery  Bilateral.  Diagnostic Studies History Mammie Lorenzo, LPN; 6/96/2952 8:41 PM) Colonoscopy  1-5 years ago  Allergies Mammie Lorenzo, LPN; 03/12/4009 2:72 PM) Telmisartan-Amlodipine *ANTIHYPERTENSIVES*   Medication History Mammie Lorenzo, LPN; 5/36/6440 3:47 PM) AmLODIPine Besylate (10MG  Tablet, Oral) Active. Atorvastatin Calcium (40MG  Tablet, Oral) Active. Tamsulosin HCl (0.4MG  Capsule, Oral) Active. Aspirin (81MG  Tablet DR, Oral) Active. Medications Reconciled  Social History Mammie Lorenzo, LPN; 04/13/9562 8:75 PM) Caffeine use  Carbonated beverages. No alcohol use  Tobacco use  Never smoker.  Family History Mammie Lorenzo, LPN; 6/43/3295 1:88 PM) Alcohol Abuse  Father.  Other Problems Mammie Lorenzo, LPN; 04/04/6062 0:16 PM) High blood pressure     Review of Systems Claiborne Billings Dockery LPN; 0/09/9322 5:57 PM) General Not Present- Appetite Loss, Chills, Fatigue, Fever, Night Sweats, Weight Gain and  Weight Loss. Skin Not Present- Change in Wart/Mole, Dryness, Hives, Jaundice, New Lesions, Non-Healing Wounds, Rash and Ulcer. HEENT Not Present- Earache, Hearing Loss, Hoarseness, Nose Bleed, Oral Ulcers, Ringing in the Ears, Seasonal Allergies, Sinus Pain, Sore Throat, Visual Disturbances, Wears glasses/contact lenses and Yellow Eyes. Respiratory Not Present- Bloody sputum, Chronic Cough, Difficulty Breathing, Snoring and Wheezing. Breast Not Present- Breast Mass, Breast Pain, Nipple Discharge and Skin Changes. Cardiovascular Not Present- Chest Pain, Difficulty Breathing Lying Down, Leg Cramps, Palpitations, Rapid Heart Rate, Shortness of Breath and Swelling of Extremities. Gastrointestinal Not Present- Abdominal Pain, Bloating, Bloody Stool, Change in Bowel Habits, Chronic diarrhea, Constipation, Difficulty Swallowing, Excessive gas, Gets full quickly at meals, Hemorrhoids, Indigestion, Nausea, Rectal Pain and Vomiting. Male Genitourinary Present- Frequency and Nocturia. Not Present- Blood in Urine, Change in Urinary Stream, Impotence, Painful Urination, Urgency and Urine Leakage.  Vitals Claiborne Billings Dockery LPN; 03/10/253 2:70 PM) 03/29/2017 1:40 PM Weight: 173.8 lb Height: 68in Body Surface Area: 1.93 m Body Mass Index: 26.43 kg/m  Temp.: 98.28F(Oral)  Pulse: 70 (Regular)  BP: 124/82 (Sitting, Left Arm, Standard)       Physical Exam Earnstine Regal MD; 03/29/2017 2:07 PM) The physical exam findings are as follows: Note:Constitutional: See vital signs recorded above  General appearance: normal development, nutritional status normal, no deformities  Eyes: conjunctiva and lids normal without lesion; pupils equal and reactive; iris normal bilaterally  Ears, nose, mouth, throat: external exam without lesion or mass; hearing grossly normal; lips normal; teeth normal for age; gums without obvious disease; oral mucosa moist, tongue normal  Neck: symmetric on extension; trachea  midline; no crepitance; no masses  Thyroid: normal in size, no palpable nodules, no tenderness  Chest - clear bilaterally without rhonchi, rales, or wheeze  Cor - regular rhythm with normal rate; no significant murmur  Abd - soft without distension; small asymptomatic easily reducible umbilical hernia  GU - normal male genitalia without mass or lesion; palpation in the left inguinal canal with cough and Valsalva shows no sign of hernia; palpation in the right inguinal canal shows an obvious bulge consistent with inguinal hernia; right inguinal hernia augments with coughing and Valsalva; hernia is reducible with some manipulation  Ext - non-tender without significant edema or lymphedema  Neuro - grossly intact; no tremor    Assessment & Plan Earnstine Regal MD; 03/29/2017 2:08 PM) INGUINAL HERNIA OF RIGHT SIDE WITHOUT OBSTRUCTION OR GANGRENE (K40.90) Current Plans Pt Education - Pamphlet Given - Hernia Surgery: discussed with patient and provided information. Patient presents with a mildly symptomatic right inguinal hernia. Patient is provided with written literature on hernia repair to review at home.  I have recommended repair of right inguinal hernia by open technique with mesh implant. We discussed the risk and benefits of the procedure including the risk of recurrence. We discussed restrictions on his activities after the surgery. We discussed the use of prosthetic mesh. Patient understands and wishes to proceed with surgery in the near future.  The risks and benefits of the procedure have been discussed at length with the patient. The patient understands the proposed procedure, potential alternative treatments, and the course of recovery to be expected. All of the patient's questions have been answered at this time. The patient wishes to proceed with surgery.  Earnstine Regal, MD, [facility] Office: [phone]

## 2017-04-14 ENCOUNTER — Ambulatory Visit (HOSPITAL_COMMUNITY): Payer: Medicare Other | Admitting: Anesthesiology

## 2017-04-14 ENCOUNTER — Ambulatory Visit (HOSPITAL_COMMUNITY)
Admission: RE | Admit: 2017-04-14 | Discharge: 2017-04-14 | Disposition: A | Discharge status: Home / Self Care | Payer: Medicare Other | Source: Ambulatory Visit | Attending: Surgery | Admitting: Surgery

## 2017-04-14 ENCOUNTER — Encounter (HOSPITAL_COMMUNITY)
Admission: RE | Disposition: A | Discharge status: Home / Self Care | Payer: Self-pay | Source: Ambulatory Visit | Attending: Surgery

## 2017-04-14 ENCOUNTER — Encounter (HOSPITAL_COMMUNITY): Payer: Self-pay | Admitting: *Deleted

## 2017-04-14 DIAGNOSIS — I10 Essential (primary) hypertension: Secondary | ICD-10-CM | POA: Insufficient documentation

## 2017-04-14 DIAGNOSIS — M1990 Unspecified osteoarthritis, unspecified site: Secondary | ICD-10-CM | POA: Diagnosis not present

## 2017-04-14 DIAGNOSIS — Z7982 Long term (current) use of aspirin: Secondary | ICD-10-CM | POA: Diagnosis not present

## 2017-04-14 DIAGNOSIS — J189 Pneumonia, unspecified organism: Secondary | ICD-10-CM | POA: Diagnosis not present

## 2017-04-14 DIAGNOSIS — K5909 Other constipation: Secondary | ICD-10-CM | POA: Insufficient documentation

## 2017-04-14 DIAGNOSIS — E785 Hyperlipidemia, unspecified: Secondary | ICD-10-CM | POA: Diagnosis not present

## 2017-04-14 DIAGNOSIS — K4090 Unilateral inguinal hernia, without obstruction or gangrene, not specified as recurrent: Secondary | ICD-10-CM | POA: Diagnosis not present

## 2017-04-14 DIAGNOSIS — Z79899 Other long term (current) drug therapy: Secondary | ICD-10-CM | POA: Insufficient documentation

## 2017-04-14 DIAGNOSIS — E119 Type 2 diabetes mellitus without complications: Secondary | ICD-10-CM | POA: Insufficient documentation

## 2017-04-14 DIAGNOSIS — G8918 Other acute postprocedural pain: Secondary | ICD-10-CM | POA: Diagnosis not present

## 2017-04-14 HISTORY — PX: INSERTION OF MESH: SHX5868

## 2017-04-14 HISTORY — PX: INGUINAL HERNIA REPAIR: SHX194

## 2017-04-14 SURGERY — REPAIR, HERNIA, INGUINAL, ADULT
Anesthesia: General | Laterality: Right

## 2017-04-14 MED ORDER — PROPOFOL 10 MG/ML IV BOLUS
INTRAVENOUS | Status: AC
  Filled 2017-04-14: qty 20

## 2017-04-14 MED ORDER — PROPOFOL 10 MG/ML IV BOLUS
INTRAVENOUS | Status: DC | PRN
  Administered 2017-04-14: 200 mg via INTRAVENOUS

## 2017-04-14 MED ORDER — CEFAZOLIN SODIUM-DEXTROSE 2-4 GM/100ML-% IV SOLN
2.0000 g | INTRAVENOUS | Status: AC
  Administered 2017-04-14: 2 g via INTRAVENOUS
  Filled 2017-04-14: qty 100

## 2017-04-14 MED ORDER — ONDANSETRON HCL 4 MG/2ML IJ SOLN
INTRAMUSCULAR | Status: DC | PRN
  Administered 2017-04-14: 4 mg via INTRAVENOUS

## 2017-04-14 MED ORDER — LIDOCAINE 2% (20 MG/ML) 5 ML SYRINGE
INTRAMUSCULAR | Status: AC
  Filled 2017-04-14: qty 5

## 2017-04-14 MED ORDER — SUCCINYLCHOLINE CHLORIDE 200 MG/10ML IV SOSY
PREFILLED_SYRINGE | INTRAVENOUS | Status: DC | PRN
  Administered 2017-04-14: 120 mg via INTRAVENOUS

## 2017-04-14 MED ORDER — PHENYLEPHRINE 40 MCG/ML (10ML) SYRINGE FOR IV PUSH (FOR BLOOD PRESSURE SUPPORT)
PREFILLED_SYRINGE | INTRAVENOUS | Status: AC
  Filled 2017-04-14: qty 10

## 2017-04-14 MED ORDER — BUPIVACAINE HCL (PF) 0.5 % IJ SOLN
INTRAMUSCULAR | Status: AC
  Filled 2017-04-14: qty 30

## 2017-04-14 MED ORDER — BUPIVACAINE HCL (PF) 0.5 % IJ SOLN
INTRAMUSCULAR | Status: DC | PRN
  Administered 2017-04-14: 18 mL

## 2017-04-14 MED ORDER — FENTANYL CITRATE (PF) 100 MCG/2ML IJ SOLN
INTRAMUSCULAR | Status: AC
  Filled 2017-04-14: qty 2

## 2017-04-14 MED ORDER — ONDANSETRON HCL 4 MG/2ML IJ SOLN
INTRAMUSCULAR | Status: AC
  Filled 2017-04-14: qty 2

## 2017-04-14 MED ORDER — BUPIVACAINE-EPINEPHRINE (PF) 0.5% -1:200000 IJ SOLN
INTRAMUSCULAR | Status: DC | PRN
  Administered 2017-04-14: 30 mL via PERINEURAL

## 2017-04-14 MED ORDER — ROCURONIUM BROMIDE 10 MG/ML (PF) SYRINGE
PREFILLED_SYRINGE | INTRAVENOUS | Status: DC | PRN
  Administered 2017-04-14: 40 mg via INTRAVENOUS

## 2017-04-14 MED ORDER — LACTATED RINGERS IV SOLN
INTRAVENOUS | Status: DC
  Administered 2017-04-14 (×2): via INTRAVENOUS

## 2017-04-14 MED ORDER — PHENYLEPHRINE 40 MCG/ML (10ML) SYRINGE FOR IV PUSH (FOR BLOOD PRESSURE SUPPORT)
PREFILLED_SYRINGE | INTRAVENOUS | Status: DC | PRN
  Administered 2017-04-14 (×2): 80 ug via INTRAVENOUS

## 2017-04-14 MED ORDER — SUCCINYLCHOLINE CHLORIDE 200 MG/10ML IV SOSY
PREFILLED_SYRINGE | INTRAVENOUS | Status: AC
  Filled 2017-04-14: qty 10

## 2017-04-14 MED ORDER — LIDOCAINE 2% (20 MG/ML) 5 ML SYRINGE
INTRAMUSCULAR | Status: DC | PRN
  Administered 2017-04-14: 100 mg via INTRAVENOUS

## 2017-04-14 MED ORDER — MORPHINE SULFATE (PF) 4 MG/ML IV SOLN: 1.0000 mg | INTRAVENOUS | Status: DC | PRN

## 2017-04-14 MED ORDER — ROCURONIUM BROMIDE 50 MG/5ML IV SOSY
PREFILLED_SYRINGE | INTRAVENOUS | Status: AC
  Filled 2017-04-14: qty 5

## 2017-04-14 MED ORDER — SUGAMMADEX SODIUM 200 MG/2ML IV SOLN
INTRAVENOUS | Status: DC | PRN
  Administered 2017-04-14: 200 mg via INTRAVENOUS

## 2017-04-14 MED ORDER — FENTANYL CITRATE (PF) 100 MCG/2ML IJ SOLN
INTRAMUSCULAR | Status: DC | PRN
  Administered 2017-04-14 (×2): 50 ug via INTRAVENOUS

## 2017-04-14 MED ORDER — 0.9 % SODIUM CHLORIDE (POUR BTL) OPTIME
TOPICAL | Status: DC | PRN
  Administered 2017-04-14: 1000 mL

## 2017-04-14 MED ORDER — HYDROCODONE-ACETAMINOPHEN 5-325 MG PO TABS: 1.0000 | ORAL_TABLET | ORAL | 0 refills | Status: DC | PRN

## 2017-04-14 MED ORDER — MEPERIDINE HCL 50 MG/ML IJ SOLN: 6.2500 mg | INTRAMUSCULAR | Status: DC | PRN

## 2017-04-14 MED ORDER — SUGAMMADEX SODIUM 200 MG/2ML IV SOLN
INTRAVENOUS | Status: AC
  Filled 2017-04-14: qty 2

## 2017-04-14 MED ORDER — PROMETHAZINE HCL 25 MG/ML IJ SOLN: 6.2500 mg | INTRAMUSCULAR | Status: DC | PRN

## 2017-04-14 SURGICAL SUPPLY — 36 items
APPLICATOR COTTON TIP 6IN STRL (MISCELLANEOUS) ×2 IMPLANT
BENZOIN TINCTURE PRP APPL 2/3 (GAUZE/BANDAGES/DRESSINGS) ×2 IMPLANT
BLADE SURG 15 STRL LF DISP TIS (BLADE) ×1 IMPLANT
BLADE SURG 15 STRL SS (BLADE) ×1
BLADE SURG SZ10 CARB STEEL (BLADE) IMPLANT
CHLORAPREP W/TINT 26ML (MISCELLANEOUS) ×4 IMPLANT
COVER SURGICAL LIGHT HANDLE (MISCELLANEOUS) ×2 IMPLANT
DECANTER SPIKE VIAL GLASS SM (MISCELLANEOUS) ×2 IMPLANT
DRAIN PENROSE 18X1/2 LTX STRL (DRAIN) ×2 IMPLANT
DRAPE LAPAROTOMY TRNSV 102X78 (DRAPE) ×2 IMPLANT
ELECT PENCIL ROCKER SW 15FT (MISCELLANEOUS) ×2 IMPLANT
ELECT REM PT RETURN 15FT ADLT (MISCELLANEOUS) ×2 IMPLANT
GAUZE SPONGE 4X4 12PLY STRL (GAUZE/BANDAGES/DRESSINGS) ×2 IMPLANT
GLOVE BIOGEL PI IND STRL 7.0 (GLOVE) ×1 IMPLANT
GLOVE BIOGEL PI INDICATOR 7.0 (GLOVE) ×1
GLOVE SURG ORTHO 8.0 STRL STRW (GLOVE) ×2 IMPLANT
GOWN STRL REUS W/TWL LRG LVL3 (GOWN DISPOSABLE) ×2 IMPLANT
GOWN STRL REUS W/TWL XL LVL3 (GOWN DISPOSABLE) ×4 IMPLANT
KIT BASIN OR (CUSTOM PROCEDURE TRAY) ×2 IMPLANT
MESH ULTRAPRO 3X6 7.6X15CM (Mesh General) ×2 IMPLANT
NEEDLE HYPO 25X1 1.5 SAFETY (NEEDLE) ×2 IMPLANT
NS IRRIG 1000ML POUR BTL (IV SOLUTION) ×2 IMPLANT
PACK BASIC VI WITH GOWN DISP (CUSTOM PROCEDURE TRAY) ×2 IMPLANT
SPONGE LAP 4X18 X RAY DECT (DISPOSABLE) ×6 IMPLANT
STRIP CLOSURE SKIN 1/2X4 (GAUZE/BANDAGES/DRESSINGS) ×2 IMPLANT
SUT MNCRL AB 4-0 PS2 18 (SUTURE) ×2 IMPLANT
SUT NOVA NAB GS-21 0 18 T12 DT (SUTURE) IMPLANT
SUT NOVA NAB GS-22 2 0 T19 (SUTURE) ×4 IMPLANT
SUT SILK 2 0 SH (SUTURE) ×2 IMPLANT
SUT VIC AB 3-0 SH 18 (SUTURE) ×2 IMPLANT
SYR BULB IRRIGATION 50ML (SYRINGE) ×2 IMPLANT
SYR CONTROL 10ML LL (SYRINGE) ×2 IMPLANT
TAPE CLOTH SURG 4X10 WHT LF (GAUZE/BANDAGES/DRESSINGS) ×2 IMPLANT
TAPE STRIPS DRAPE STRL (GAUZE/BANDAGES/DRESSINGS) ×2 IMPLANT
TOWEL OR 17X26 10 PK STRL BLUE (TOWEL DISPOSABLE) ×2 IMPLANT
YANKAUER SUCT BULB TIP 10FT TU (MISCELLANEOUS) ×2 IMPLANT

## 2017-04-14 NOTE — Anesthesia Preprocedure Evaluation (Signed)
Anesthesia Evaluation  Patient identified by MRN, date of birth, ID band Patient awake    Reviewed: Allergy & Precautions, NPO status , Patient's Chart, lab work & pertinent test results  Airway Mallampati: II  TM Distance: >3 FB Neck ROM: Full    Dental no notable dental hx.    Pulmonary pneumonia, resolved,    Pulmonary exam normal breath sounds clear to auscultation       Cardiovascular hypertension, Pt. on medications Normal cardiovascular exam Rhythm:Regular Rate:Normal     Neuro/Psych  Headaches, negative psych ROS   GI/Hepatic Neg liver ROS,   Endo/Other  diabetes, Well Controlled, Type 2Hyperlipidemia  Renal/GU negative Renal ROS  negative genitourinary   Musculoskeletal  (+) Arthritis , Osteoarthritis,  Right Inguinal hernia   Abdominal   Peds  Hematology negative hematology ROS (+)   Anesthesia Other Findings   Reproductive/Obstetrics                               Chemistry      Component Value Date/Time   NA 143 04/11/2017 0927   K 4.3 04/11/2017 0927   CL 109 04/11/2017 0927   CO2 29 04/11/2017 0927   BUN 15 04/11/2017 0927   CREATININE 0.85 04/11/2017 0927      Component Value Date/Time   CALCIUM 9.4 04/11/2017 0927   ALKPHOS 162 (H) 07/31/2015 1717   AST 21 07/31/2015 1717   ALT 19 07/31/2015 1717   BILITOT 0.8 07/31/2015 1717     Lab Results  Component Value Date   WBC 5.0 04/11/2017   HGB 12.8 (L) 04/11/2017   HCT 39.1 04/11/2017   MCV 95.8 04/11/2017   PLT 263 04/11/2017   EKG: sinus bradycardia, 1st deg AV block, non specific ST-T wave changes.  Anesthesia Physical Anesthesia Plan  ASA: II  Anesthesia Plan: General   Post-op Pain Management:    Induction: Intravenous  Airway Management Planned: Oral ETT  Additional Equipment:   Intra-op Plan:   Post-operative Plan: Extubation in OR  Informed Consent: I have reviewed the patients History  and Physical, chart, labs and discussed the procedure including the risks, benefits and alternatives for the proposed anesthesia with the patient or authorized representative who has indicated his/her understanding and acceptance.   Dental advisory given  Plan Discussed with: Anesthesiologist, CRNA and Surgeon  Anesthesia Plan Comments:         Anesthesia Quick Evaluation

## 2017-04-14 NOTE — Anesthesia Procedure Notes (Addendum)
Anesthesia Regional Block: TAP block   Pre-Anesthetic Checklist: ,, timeout performed, Correct Patient, Correct Site, Correct Laterality, Correct Procedure, Correct Position, site marked, Risks and benefits discussed,  Surgical consent,  Pre-op evaluation,  At surgeon's request and post-op pain management  Laterality: Right  Prep: chloraprep       Needles:  Injection technique: Single-shot  Needle Type: Echogenic Stimulator Needle     Needle Length: 9cm  Needle Gauge: 21   Needle insertion depth: 6 cm   Additional Needles:   Procedures: ultrasound guided,,,,,,,,  Narrative:  Start time: 04/14/2017 8:05 AM End time: 04/14/2017 8:11 AM Injection made incrementally with aspirations every 5 mL.  Performed by: Personally  Anesthesiologist: Josephine Igo  Additional Notes: Timeout performed. Patient sedated. Relevant anatomy ID'd using [place]. Incremental 2-70ml injection with frequent aspiration. Patient tolerated procedure well.

## 2017-04-14 NOTE — Interval H&P Note (Signed)
History and Physical Interval Note:  04/14/2017 8:24 AM  Tanner Griffin  has presented today for surgery, with the diagnosis of Right inguinal hernia, reducible.  The various methods of treatment have been discussed with the patient and family. After consideration of risks, benefits and other options for treatment, the patient has consented to    Procedure(s): RIGHT INGUINAL HERNIA REPAIR WITH MESH (Right) INSERTION OF MESH (Right) as a surgical intervention .    The patient's history has been reviewed, patient examined, no change in status, stable for surgery.  I have reviewed the patient's chart and labs.  Questions were answered to the patient's satisfaction.    Earnstine Regal, MD, [facility] Office: [place]

## 2017-04-14 NOTE — Progress Notes (Signed)
Assisted Dr. Foster with right, ultrasound guided, transabdominal plane block. Side rails up, monitors on throughout procedure. See vital signs in flow sheet. Tolerated Procedure well. 

## 2017-04-14 NOTE — Op Note (Signed)
Inguinal Hernia, Open, Procedure Note  Pre-operative Diagnosis:  Right inguinal hernia, reducible  Post-operative Diagnosis: same  Surgeon:  Earnstine Regal, MD, FACS  Anesthesia:  General  Preparation:  Chlora-prep  Estimated Blood Loss: Minimal  Complications:  none  Indications: The patient presented with a right, reducible hernia.  Patient is referred by Dr. Garret Reddish for evaluation and management of right inguinal hernia. Patient first noted this hernia little over a month ago. It has gradually become larger. He has problems with chronic constipation. Patient does do a fair amount of heavy lifting. He has had no prior hernia repairs.   Procedure Details  The patient was evaluated in the holding area. All of the patient's questions were answered and the proposed procedure was confirmed. The site of the procedure was properly marked. The patient was taken to the Operating Room, identified by name, and the procedure verified as inguinal hernia repair.  The patient was placed in the supine position and underwent induction of anesthesia. A "Time Out" was performed per routine. The lower abdomen and groin were prepped and draped in the usual aseptic fashion.  After ascertaining that an adequate level of anesthesia had been obtained, an incision was made in the groin with a #10 blade.  Dissection was carried through the subcutaneous tissues and hemostasis obtained with the electrocautery.  A Gelpi retractor was placed for exposure.  The external oblique fascia was incised in line with it's fibers and extended through the external inguinal ring.  The cord structures were dissected out of the inguinal canal and encircled with a Penrose drain.  The floor of the inguinal canal was dissected out.  There was some laxity of the floor but no fascial defect.  The cord was explored and a moderate lipoma of the cord was dissected out and excised.  A moderately large hernia sac was dissected out and  excised and closed with a 2-0 Silk suture.  There was a nodular mass in the hernia sac, so it was submitted to pathology for review.  The floor of the inguinal canal was reconstructed with Ethicon Ultrapro mesh cut to the appropriate dimensions.  It was secured to the pubic tubercle with a 2-0 Novafil suture and along the inguinal ligament with a running 2-0 Novafil suture.  Mesh was split to accommodate the cord structures.  The superior margin of the mesh was secured to the transversalis and internal oblique musculature with interrupted 2-0 Novafil sutures.  The tails of the mesh were overlapped lateral to the cord structures and secured to the inguinal ligament with interrupted 2-0 Novafil sutures to recreate the internal inguinal ring.  Cord structures were returned to the inguinal canal.  Local anesthetic was infiltrated throughout the field.  External oblique fascia was closed with interrupted 3-0 Vicryl sutures.  Subcutaneous tissues were closed with interrupted 3-0 Vicryl sutures.  Skin was anesthetized with local anesthetic, and the skin edges were re-approximated with a running 4-0 Monocryl suture.  Wound was washed and dried and steristrips were applied.  A dry gauze dressing was applied.  Instrument, sponge, and needle counts were correct prior to closure and at the conclusion of the case.  The patient tolerated the procedure well.  The patient was awakened from anesthesia and brought to the recovery room in stable condition.  Earnstine Regal, MD, [facility] Office: [phone]

## 2017-04-14 NOTE — Transfer of Care (Signed)
Immediate Anesthesia Transfer of Care Note  Patient: Tanner Griffin  Procedure(s) Performed: Procedure(s): RIGHT INGUINAL HERNIA REPAIR WITH MESH (Right) INSERTION OF MESH (Right)  Patient Location: PACU  Anesthesia Type:General  Level of Consciousness: sedated  Airway & Oxygen Therapy: Patient Spontanous Breathing and Patient connected to face mask oxygen  Post-op Assessment: Report given to RN and Post -op Vital signs reviewed and stable  Post vital signs: Reviewed and stable  Last Vitals:  Vitals:   04/14/17 0828 04/14/17 0830  BP:    Pulse: 83 73  Resp: 12 (!) 0  Temp:      Last Pain:  Vitals:   04/14/17 0702  TempSrc: Oral      Patients Stated Pain Goal: 4 (33/74/45 1460)  Complications: No apparent anesthesia complications

## 2017-04-14 NOTE — Anesthesia Procedure Notes (Signed)
Procedure Name: Intubation Date/Time: 04/14/2017 8:39 AM Performed by: Lind Covert Pre-anesthesia Checklist: Patient identified, Emergency Drugs available, Suction available, Patient being monitored and Timeout performed Patient Re-evaluated:Patient Re-evaluated prior to inductionOxygen Delivery Method: Circle system utilized Preoxygenation: Pre-oxygenation with 100% oxygen Intubation Type: IV induction Laryngoscope Size: Mac and 4 Grade View: Grade I Tube type: Oral Tube size: 7.5 mm Number of attempts: 1 Airway Equipment and Method: Stylet Placement Confirmation: ETT inserted through vocal cords under direct vision,  positive ETCO2 and breath sounds checked- equal and bilateral Secured at: 22 cm Tube secured with: Tape Dental Injury: Teeth and Oropharynx as per pre-operative assessment

## 2017-04-14 NOTE — Anesthesia Postprocedure Evaluation (Signed)
Anesthesia Post Note  Patient: Tanner Griffin  Procedure(s) Performed: Procedure(s) (LRB): RIGHT INGUINAL HERNIA REPAIR WITH MESH (Right) INSERTION OF MESH (Right)  Patient location during evaluation: PACU Anesthesia Type: General Level of consciousness: awake and alert and oriented Pain management: pain level controlled Vital Signs Assessment: post-procedure vital signs reviewed and stable Respiratory status: spontaneous breathing, nonlabored ventilation and respiratory function stable Cardiovascular status: blood pressure returned to baseline and stable Postop Assessment: no signs of nausea or vomiting Anesthetic complications: no       Last Vitals:  Vitals:   04/14/17 1030 04/14/17 1048  BP: (!) 168/87 (!) 181/88  Pulse: 70 69  Resp: 15 16  Temp: 36.7 C 36.7 C    Last Pain:  Vitals:   04/14/17 1030  TempSrc:   PainSc: 0-No pain                 Bridie Colquhoun A.

## 2017-04-15 ENCOUNTER — Encounter (HOSPITAL_COMMUNITY): Payer: Self-pay | Admitting: Surgery

## 2017-05-03 ENCOUNTER — Other Ambulatory Visit: Payer: Self-pay | Admitting: Family Medicine

## 2017-05-10 ENCOUNTER — Ambulatory Visit: Payer: Medicare Other | Admitting: Family Medicine

## 2017-08-05 ENCOUNTER — Other Ambulatory Visit: Payer: Self-pay

## 2017-08-05 MED ORDER — ATORVASTATIN CALCIUM 40 MG PO TABS: 40.0000 mg | ORAL_TABLET | Freq: Every day | ORAL | 3 refills | Status: DC

## 2017-08-10 ENCOUNTER — Encounter: Payer: Self-pay | Admitting: Family Medicine

## 2017-08-10 ENCOUNTER — Ambulatory Visit (INDEPENDENT_AMBULATORY_CARE_PROVIDER_SITE_OTHER): Payer: Medicare Other | Admitting: Family Medicine

## 2017-08-10 DIAGNOSIS — N401 Enlarged prostate with lower urinary tract symptoms: Secondary | ICD-10-CM | POA: Diagnosis not present

## 2017-08-10 DIAGNOSIS — Z96651 Presence of right artificial knee joint: Secondary | ICD-10-CM | POA: Diagnosis not present

## 2017-08-10 DIAGNOSIS — E119 Type 2 diabetes mellitus without complications: Secondary | ICD-10-CM | POA: Diagnosis not present

## 2017-08-10 DIAGNOSIS — T8484XD Pain due to internal orthopedic prosthetic devices, implants and grafts, subsequent encounter: Secondary | ICD-10-CM | POA: Diagnosis not present

## 2017-08-10 DIAGNOSIS — E785 Hyperlipidemia, unspecified: Secondary | ICD-10-CM | POA: Diagnosis not present

## 2017-08-10 DIAGNOSIS — N138 Other obstructive and reflux uropathy: Secondary | ICD-10-CM | POA: Diagnosis not present

## 2017-08-10 DIAGNOSIS — I10 Essential (primary) hypertension: Secondary | ICD-10-CM

## 2017-08-10 LAB — COMPREHENSIVE METABOLIC PANEL
ALBUMIN: 4.1 g/dL (ref 3.5–5.2)
ALT: 16 U/L (ref 0–53)
AST: 18 U/L (ref 0–37)
Alkaline Phosphatase: 186 U/L — ABNORMAL HIGH (ref 39–117)
BUN: 14 mg/dL (ref 6–23)
CHLORIDE: 108 meq/L (ref 96–112)
CO2: 31 meq/L (ref 19–32)
Calcium: 9.7 mg/dL (ref 8.4–10.5)
Creatinine, Ser: 0.83 mg/dL (ref 0.40–1.50)
GFR: 116.36 mL/min (ref >60.00)
GLUCOSE: 124 mg/dL — AB (ref 70–99)
Potassium: 4.2 mEq/L (ref 3.5–5.1)
SODIUM: 143 meq/L (ref 135–145)
Total Bilirubin: 0.5 mg/dL (ref 0.2–1.2)
Total Protein: 7 g/dL (ref 6.0–8.3)

## 2017-08-10 LAB — LIPID PANEL
CHOL/HDL RATIO: 4
Cholesterol: 158 mg/dL (ref 0–200)
HDL: 44.3 mg/dL (ref >39.00)
LDL Cholesterol: 92 mg/dL (ref 0–99)
NONHDL: 113.47
Triglycerides: 108 mg/dL (ref 0.0–149.0)
VLDL: 21.6 mg/dL (ref 0.0–40.0)

## 2017-08-10 LAB — HEMOGLOBIN A1C: HEMOGLOBIN A1C: 5.4 % (ref 4.6–6.5)

## 2017-08-10 LAB — CBC
HCT: 40.5 % (ref 39.0–52.0)
Hemoglobin: 13.3 g/dL (ref 13.0–17.0)
MCHC: 32.8 g/dL (ref 30.0–36.0)
MCV: 99.4 fl (ref 78.0–100.0)
Platelets: 292 10*3/uL (ref 150.0–400.0)
RBC: 4.07 Mil/uL — AB (ref 4.22–5.81)
RDW: 13 % (ref 11.5–15.5)
WBC: 4.3 10*3/uL (ref 4.0–10.5)

## 2017-08-10 MED ORDER — TAMSULOSIN HCL 0.4 MG PO CAPS: 0.4000 mg | ORAL_CAPSULE | Freq: Every day | ORAL | 3 refills | Status: DC

## 2017-08-10 MED ORDER — DICLOFENAC SODIUM 1 % TD GEL: 2.0000 g | Freq: Four times a day (QID) | TRANSDERMAL | 3 refills | Status: DC

## 2017-08-10 NOTE — Progress Notes (Signed)
Subjective:  Tanner Griffin is a 76 y.o. year old very pleasant male patient who presents for/with See problem oriented charting ROS- No chest pain or shortness of breath. No headache or blurry vision.    Past Medical History-  Patient Active Problem List   Diagnosis Date Noted  . Focal myoclonus 06/05/2014    Priority: High  . Type II diabetes mellitus, well controlled ([place]) 02/12/2008    Priority: High  . BPH with obstruction/lower urinary tract symptoms 06/05/2012    Priority: Medium  . Hyperlipidemia 06/19/2007    Priority: Medium  . Essential hypertension 06/19/2007    Priority: Medium  . Constipation 01/20/2015    Priority: Low  . Osteoarthritis of knee s/p bilateral knee replacements 03/26/2014    Priority: Low  . Spongiotic dermatitis 03/19/2013    Priority: Low  . Personal history of colonic adenoma 10/30/2012    Priority: Low  . Right inguinal hernia 03/14/2017  . Degenerative disc disease, lumbar 01/11/2017  . Polyarthralgia 04/26/2016  . Pain due to total knee replacement ([place]) 04/26/2016    Medications- reviewed and updated Current Outpatient Prescriptions  Medication Sig Dispense Refill  . amLODipine (NORVASC) 10 MG tablet TAKE 1 TABLET BY MOUTH DAILY 90 tablet 1  . aspirin EC 81 MG tablet Take 81 mg by mouth daily.    [name] atorvastatin (LIPITOR) 40 MG tablet Take 1 tablet (40 mg total) by mouth daily. 90 tablet 3  . Multiple Vitamin (MULTIVITAMIN WITH MINERALS) TABS tablet Take 1 tablet by mouth daily.    . tamsulosin (FLOMAX) 0.4 MG CAPS capsule Take 1 capsule (0.4 mg total) by mouth daily. 90 capsule 3  . diclofenac sodium (VOLTAREN) 1 % GEL Apply 2 g topically 4 (four) times daily. 100 g 3   No current facility-administered medications for this visit.     Objective: BP 130/70 (BP Location: Left Arm, Patient Position: Sitting, Cuff Size: Large)   Pulse 65   Temp 98.4 F (36.9 C) (Oral)   Ht 5\' 8"  (1.727 m)   Wt 171 lb 9.6 oz (77.8 kg)   SpO2 98%    BMI 26.09 kg/m  Gen: NAD, resting comfortably CV: RRR no murmurs rubs or gallops Lungs: CTAB no crackles, wheeze, rhonchi Ext: no edema Skin: warm, dry Neuro: walks with cane  Assessment/Plan:  Right inguinal hernia repair - healed well Advised flu shot in the fall  Patient Instructions  Please stop by lab before you go  If you have had your eye exam within the last year, please sign release of information at check out desk. If you have not had an eye exam within a year, please get one at this time as this is important for your diabetes care  Trial voltaren gel for the knees  Advised flu shot in the fall   Type II diabetes mellitus, well controlled ([place]) S: well controlled. On no medication.  Lab Results  Component Value Date   HGBA1C 5.6 11/09/2016   HGBA1C 5.7 (H) 10/17/2015   HGBA1C 5.9 01/20/2015   A/P:  update a1c today. Needs updated eye exam.   Essential hypertension S: controlled on amlodipine 10mg  and labetalol 200mg  BID.  BP Readings from Last 3 Encounters:  08/10/17 130/70  04/14/17 (!) 159/84  04/11/17 (!) 160/87  A/P: We discussed blood pressure goal of <140/90. Continue current meds  Hyperlipidemia S: well controlled on atorvastatin 40mg  on last check. No myalgias.  Lab Results  Component Value Date   CHOL 143 01/20/2015  HDL 49.10 01/20/2015   LDLCALC 76 01/20/2015   LDLDIRECT 153.2 11/30/2013   TRIG 88.0 01/20/2015   CHOLHDL 3 01/20/2015   A/P: update lipid panel today. LDL goal at least under 100, prefer under 66  BPH with obstruction/lower urinary tract symptoms Continue flomax. refilled  Pain due to total knee replacement ([facility]) S: right knee pain despite knee replacement states has "never felt right". Does not want to return to ortho A/P: trial voltaren gel  Return in about 6 months (around 02/10/2018) for follow up- or sooner if needed.  Orders Placed This Encounter  Procedures  . CBC    [place]  . Comprehensive metabolic panel     [place]    Order Specific Question:   Has the patient fasted?    Answer:   No  . Lipid panel    [place]    Order Specific Question:   Has the patient fasted?    Answer:   No  . Hemoglobin A1c    [place]    Meds ordered this encounter  Medications  . tamsulosin (FLOMAX) 0.4 MG CAPS capsule    Sig: Take 1 capsule (0.4 mg total) by mouth daily.    Dispense:  90 capsule    Refill:  3  . diclofenac sodium (VOLTAREN) 1 % GEL    Sig: Apply 2 g topically 4 (four) times daily.    Dispense:  100 g    Refill:  3    Return precautions advised.  Garret Reddish, MD

## 2017-08-10 NOTE — Assessment & Plan Note (Signed)
S: right knee pain despite knee replacement states has "never felt right". Does not want to return to ortho A/P: trial voltaren gel

## 2017-08-10 NOTE — Assessment & Plan Note (Signed)
S: well controlled on atorvastatin 40mg  on last check. No myalgias.  Lab Results  Component Value Date   CHOL 143 01/20/2015   HDL 49.10 01/20/2015   LDLCALC 76 01/20/2015   LDLDIRECT 153.2 11/30/2013   TRIG 88.0 01/20/2015   CHOLHDL 3 01/20/2015   A/P: update lipid panel today. LDL goal at least under 100, prefer under 70

## 2017-08-10 NOTE — Assessment & Plan Note (Signed)
S: well controlled. On no medication.  Lab Results  Component Value Date   HGBA1C 5.6 11/09/2016   HGBA1C 5.7 (H) 10/17/2015   HGBA1C 5.9 01/20/2015   A/P:  update a1c today. Needs updated eye exam.

## 2017-08-10 NOTE — Assessment & Plan Note (Signed)
Continue flomax. refilled

## 2017-08-10 NOTE — Assessment & Plan Note (Signed)
S: controlled on amlodipine 10mg  and labetalol 200mg  BID.  BP Readings from Last 3 Encounters:  08/10/17 130/70  04/14/17 (!) 159/84  04/11/17 (!) 160/87  A/P: We discussed blood pressure goal of <140/90. Continue current meds

## 2017-08-10 NOTE — Patient Instructions (Addendum)
Please stop by lab before you go  If you have had your eye exam within the last year, please sign release of information at check out desk. If you have not had an eye exam within a year, please get one at this time as this is important for your diabetes care  Trial voltaren gel for the knees  Advised flu shot in the fall

## 2017-08-15 ENCOUNTER — Other Ambulatory Visit: Payer: Self-pay | Admitting: Family Medicine

## 2017-09-12 ENCOUNTER — Other Ambulatory Visit: Payer: Self-pay | Admitting: Family Medicine

## 2017-10-03 ENCOUNTER — Telehealth: Payer: Self-pay | Admitting: Family Medicine

## 2017-10-03 NOTE — Telephone Encounter (Signed)
Scheduled patient for Wednesday, October 06, 2017 @ 08:30am

## 2017-10-03 NOTE — Telephone Encounter (Signed)
Patient's wife called in reference to patient having issues going to restroom. Patient's wife stated OTC medications are not working for patient. Patient's wife would like to know if something stronger can be prescribed or if patient needs to be seen. Please call patients wife and advise ok to leave message.

## 2017-10-06 ENCOUNTER — Ambulatory Visit: Payer: Medicare Other | Admitting: Family Medicine

## 2017-10-19 ENCOUNTER — Encounter: Payer: Self-pay | Admitting: Family Medicine

## 2017-10-19 ENCOUNTER — Ambulatory Visit (INDEPENDENT_AMBULATORY_CARE_PROVIDER_SITE_OTHER): Payer: Medicare Other | Admitting: Family Medicine

## 2017-10-19 VITALS — BP 112/60 | HR 66 | Temp 98.3°F | Ht 68.0 in | Wt 176.6 lb

## 2017-10-19 DIAGNOSIS — Z23 Encounter for immunization: Secondary | ICD-10-CM

## 2017-10-19 DIAGNOSIS — T8484XD Pain due to internal orthopedic prosthetic devices, implants and grafts, subsequent encounter: Secondary | ICD-10-CM | POA: Diagnosis not present

## 2017-10-19 DIAGNOSIS — Z96651 Presence of right artificial knee joint: Secondary | ICD-10-CM

## 2017-10-19 DIAGNOSIS — K5900 Constipation, unspecified: Secondary | ICD-10-CM | POA: Diagnosis not present

## 2017-10-19 MED ORDER — DICLOFENAC SODIUM 1 % TD GEL: 2.0000 g | Freq: Four times a day (QID) | TRANSDERMAL | 3 refills | Status: DC

## 2017-10-19 NOTE — Assessment & Plan Note (Signed)
S: Last BM was the night before last and then Saturday. Having bowel movement at least twice a week. Very firm bowel movements since late august or so. Drinking only 1-2 glasses of water a day. Has tried colace as well. sonds like may have tried senokot  After visit noted in overview "Multiple trials (miralax, senokot-S, metamucil, colace) none particularly help even with BID miralax. Prunes help the most. "  Colonoscopy 10/30/12 with no planned recall unless advised by me per Dr. Carlean Purl.  A/P: I want to retrial miralax- I think issues has been inadequate hydration. Drinks 1-2 glasses of water a day. Encouraged him to get to at least 3-4 glasses a day- including 1 glass with miralax- see AVS. Likely needs more long term. Discussed possible option liek Amitiza. He does not want to drink more fluids due to bladder issues- BPH

## 2017-10-19 NOTE — Progress Notes (Signed)
Subjective:  Tanner Griffin is a 76 y.o. year old very pleasant male patient who presents for/with See problem oriented charting ROS- no nausea or vomiting. No chest pain. Some rectal pain with BM when firm . Has right knee pain  Past Medical History-  Patient Active Problem List   Diagnosis Date Noted  . Focal myoclonus 06/05/2014    Priority: High  . Type II diabetes mellitus, well controlled (Tanner Griffin) 02/12/2008    Priority: High  . BPH with obstruction/lower urinary tract symptoms 06/05/2012    Priority: Medium  . Hyperlipidemia 06/19/2007    Priority: Medium  . Essential hypertension 06/19/2007    Priority: Medium  . Constipation 01/20/2015    Priority: Low  . Osteoarthritis of knee s/p bilateral knee replacements 03/26/2014    Priority: Low  . Spongiotic dermatitis 03/19/2013    Priority: Low  . Personal history of colonic adenoma 10/30/2012    Priority: Low  . Right inguinal hernia 03/14/2017  . Degenerative disc disease, lumbar 01/11/2017  . Polyarthralgia 04/26/2016  . Pain due to total knee replacement (Tanner Griffin) 04/26/2016    Medications- reviewed and updated Current Outpatient Prescriptions  Medication Sig Dispense Refill  . amLODipine (NORVASC) 10 MG tablet TAKE 1 TABLET BY MOUTH DAILY 90 tablet 1  . aspirin EC 81 MG tablet Take 81 mg by mouth daily.    [name] atorvastatin (LIPITOR) 40 MG tablet Take 1 tablet (40 mg total) by mouth daily. 90 tablet 3  . diclofenac sodium (VOLTAREN) 1 % GEL Apply 2 g topically 4 (four) times daily. 100 g 3  . Multiple Vitamin (MULTIVITAMIN WITH MINERALS) TABS tablet Take 1 tablet by mouth daily.    . tamsulosin (FLOMAX) 0.4 MG CAPS capsule Take 1 capsule (0.4 mg total) by mouth daily. 90 capsule 3   No current facility-administered medications for this visit.     Objective: BP 112/60 (BP Location: Left Arm, Patient Position: Sitting, Cuff Size: Large)   Pulse 66   Temp 98.3 F (36.8 C) (Oral)   Ht 5\' 8"  (1.727 m)   Wt 176 lb 9.6 oz  (80.1 kg)   SpO2 98%   BMI 26.85 kg/m  Gen: NAD, resting comfortably CV: RRR no murmurs rubs or gallops Lungs: CTAB no crackles, wheeze, rhonchi Abdomen: soft/nontender/nondistended/normal bowel sounds. No rebound or guarding.  Ext: no edema Skin: warm, dry Neuro: walks with cane and slowly per baseline  Assessment/Plan:  Constipation S: Last BM was the night before last and then Saturday. Having bowel movement at least twice a week. Very firm bowel movements since late august or so. Drinking only 1-2 glasses of water a day. Has tried colace as well. sonds like may have tried senokot  After visit noted in overview "Multiple trials (miralax, senokot-S, metamucil, colace) none particularly help even with BID miralax. Prunes help the most. "  Colonoscopy 10/30/12 with no planned recall unless advised by me per Dr. Carlean Purl.  A/P: I want to retrial miralax- I think issues has been inadequate hydration. Drinks 1-2 glasses of water a day. Encouraged him to get to at least 3-4 glasses a day- including 1 glass with miralax- see AVS. Likely needs more long term. Discussed possible option liek Amitiza. He does not want to drink more fluids due to bladder issues- BPH  Pain due to total knee replacement (Tanner Griffin) Still with right knee pain. Never picked up voltaren gel- resent today. Discussed icy hot alternative if not affordable  Orders Placed This Encounter  Procedures  .  Flu vaccine HIGH DOSE PF    Meds ordered this encounter  Medications  . diclofenac sodium (VOLTAREN) 1 % GEL    Sig: Apply 2 g topically 4 (four) times daily.    Dispense:  100 g    Refill:  3    Return precautions advised.  Garret Reddish, MD

## 2017-10-19 NOTE — Assessment & Plan Note (Signed)
Still with right knee pain. Never picked up voltaren gel- resent today. Discussed icy hot alternative if not affordable

## 2017-10-19 NOTE — Patient Instructions (Signed)
Drink a full glass of water with capful of miralax mixed in daily for 3 days. If you are having daily bowel movements or very loose stool can go to half capful. If still with loose BMs can go to every other day half capful- may want to do this for a while to keep you more regular.   Make sure to drink at least 3-4 glasses of water a day  Try voltaren for the knees (gel I sent to pharmacy). Use icy hot if this is too expensive  Make sure to get your eye exam

## 2017-10-20 ENCOUNTER — Telehealth: Payer: Self-pay | Admitting: Family Medicine

## 2017-10-20 NOTE — Telephone Encounter (Signed)
Patient's wife called in reference to Rx for diclofenac sodium (VOLTAREN) 1 % GEL. Patient's wife stated this and all future prescriptions need to be sent to  [id number] [address], [place] [phone] (Phone) [phone] (Fax)   Please advise.

## 2017-11-04 ENCOUNTER — Ambulatory Visit: Payer: Medicare Other | Admitting: Family Medicine

## 2017-11-07 ENCOUNTER — Telehealth: Payer: Self-pay | Admitting: Family Medicine

## 2017-11-07 NOTE — Telephone Encounter (Signed)
Tayten Bergdoll called to make the patient an appointment for next Tuesday or next Wednesday for constant urination (no pain). I encouraged her to make the appointment for before the holiday however, she declined and stated "he has gone this long he will be okay." Due to the strict scheduling options provided I placed the patient with Dr. Jerline Pain on 11/27 at 10am. Call patient to advise if necessary.

## 2017-11-08 NOTE — Telephone Encounter (Signed)
What about the 3pm with me on Tuesday? That is open  Thanks for encouraging sooner follow up though

## 2017-11-09 NOTE — Telephone Encounter (Signed)
I tried to call Tanner Griffin to see about changing appointment time. It went straight to voicemail and her mail box has not been set up

## 2017-11-15 ENCOUNTER — Encounter: Payer: Self-pay | Admitting: Family Medicine

## 2017-11-15 ENCOUNTER — Ambulatory Visit (INDEPENDENT_AMBULATORY_CARE_PROVIDER_SITE_OTHER): Payer: Medicare Other | Admitting: Family Medicine

## 2017-11-15 VITALS — BP 160/90 | HR 72 | Temp 98.0°F | Ht 68.0 in | Wt 178.2 lb

## 2017-11-15 DIAGNOSIS — R350 Frequency of micturition: Secondary | ICD-10-CM | POA: Diagnosis not present

## 2017-11-15 DIAGNOSIS — I152 Hypertension secondary to endocrine disorders: Secondary | ICD-10-CM

## 2017-11-15 DIAGNOSIS — E1159 Type 2 diabetes mellitus with other circulatory complications: Secondary | ICD-10-CM

## 2017-11-15 DIAGNOSIS — I10 Essential (primary) hypertension: Secondary | ICD-10-CM

## 2017-11-15 DIAGNOSIS — N401 Enlarged prostate with lower urinary tract symptoms: Secondary | ICD-10-CM

## 2017-11-15 DIAGNOSIS — R21 Rash and other nonspecific skin eruption: Secondary | ICD-10-CM

## 2017-11-15 DIAGNOSIS — N138 Other obstructive and reflux uropathy: Secondary | ICD-10-CM

## 2017-11-15 LAB — POC URINALSYSI DIPSTICK (AUTOMATED)
Bilirubin, UA: NEGATIVE
Blood, UA: NEGATIVE
Glucose, UA: NEGATIVE
Ketones, UA: NEGATIVE
LEUKOCYTES UA: NEGATIVE
Nitrite, UA: NEGATIVE
PH UA: 6 (ref 5.0–8.0)
PROTEIN UA: 15
Spec Grav, UA: >=1.03 — AB (ref 1.010–1.025)
UROBILINOGEN UA: 0.2 U/dL

## 2017-11-15 NOTE — Progress Notes (Signed)
Subjective:  Tanner Griffin is a 76 y.o. male who presents today with a chief complaint of urinary frequency.   HPI:  Urinary Frequency, Acute Issue Patient noticed a worsening in his symptoms about 1-2 months. He was diagnosed with constipation about a month ago and was told to drink 3 glass of water per day - he attributes this to his increased frequency. He has a history of BPH and was prescribed flomax - though reports that he stopped taking it a few weeks ago because he thought it was making his symptoms worse. He usually only voids a small amount. He wakes up about 6 times per night to void. No dysuria. No incontinence. No fevers or chills.   Rash, Acute Issue Also started about 2 months ago. Was seen by a dermatologist and prescribed a cream that he does not remember the name of. With the cream, the rash has been improving.  No obvious precipitating factors.  No new soaps, detergents, or lotions.  He has been putting cocoa butter to the area once daily.  Hypertension, Chronic Problem, worsening BP Readings from Last 3 Encounters:  11/15/17 (!) 160/90  10/19/17 112/60  08/10/17 130/70   Current Medications: Amlodipine 10 mg daily, compliant without side effects.  ROS: Per HPI  PMH: Smoking history reviewed.  Never smoker.  Objective:  Physical Exam: BP (!) 160/90 (BP Location: Left Arm, Cuff Size: Normal)   Pulse 72   Temp 98 F (36.7 C) (Oral)   Ht 5\' 8"  (1.727 m)   Wt 178 lb 3.2 oz (80.8 kg)   SpO2 100%   BMI 27.10 kg/m   Gen: NAD, resting comfortably CV: RRR with no murmurs appreciated Pulm: NWOB, CTAB with no crackles, wheezes, or rhonchi GI: Normal bowel sounds present. Soft, Nontender, Nondistended. MSK: No edema, cyanosis, or clubbing noted Skin: Dry, erythematous, hyperpigmented rash along upper legs and flanks.  Skin on abdomen noted to be dry.  No signs of infection. Neuro: Grossly normal, moves all extremities Psych: Normal affect and thought  content  Results for orders placed or performed in visit on 11/15/17 (from the past 72 hour(s))  POCT Urinalysis Dipstick (Automated)     Status: Abnormal   Collection Time: 11/15/17 10:36 AM  Result Value Ref Range   Color, UA Yellow    Clarity, UA Clear    Glucose, UA Negative    Bilirubin, UA Negative    Ketones, UA Negative    Spec Grav, UA >=1.030 (A) 1.010 - 1.025   Blood, UA Negative    pH, UA 6.0 5.0 - 8.0   Protein, UA 15    Urobilinogen, UA 0.2 0.2 or 1.0 E.U./dL   Nitrite, UA Negative    Leukocytes, UA Negative Negative   Assessment/Plan:  BPH with obstruction/lower urinary tract symptoms Likely the source of patient's urinary frequency.  He does not have any signs of urinary retention today.  His UA is negative for infection and glucose.  His A1c's are typically well controlled-do not think the diabetes is significantly playing a role here.  Advised patient to restart Flomax.  Also discussed starting finasteride, however patient deferred.  He will follow-up with his PCP soon to discuss further.  Hypertension associated with diabetes ([place]) Mildly elevated today.  Typically well controlled.  No medication changes today.  Anticipate that his blood pressure will probably drop a few points with him restarting Flomax.  Continue amlodipine 10 mg daily.  Advised follow-up with PCP soon.  Rash  Likely dry skin/eczema.  Given that is improving, we will not change therapy at this point.  Advised patient to continue using daily or twice daily moisturizer.  Also advised patient continue using the cream that he was prescribed by his dermatologist.  Return precautions reviewed.  Algis Greenhouse. Jerline Pain, MD 11/15/2017 1:07 PM

## 2017-11-15 NOTE — Assessment & Plan Note (Signed)
Mildly elevated today.  Typically well controlled.  No medication changes today.  Anticipate that his blood pressure will probably drop a few points with him restarting Flomax.  Continue amlodipine 10 mg daily.  Advised follow-up with PCP soon.

## 2017-11-15 NOTE — Assessment & Plan Note (Signed)
Likely the source of patient's urinary frequency.  He does not have any signs of urinary retention today.  His UA is negative for infection and glucose.  His A1c's are typically well controlled-do not think the diabetes is significantly playing a role here.  Advised patient to restart Flomax.  Also discussed starting finasteride, however patient deferred.  He will follow-up with his PCP soon to discuss further.

## 2017-11-15 NOTE — Patient Instructions (Signed)
Restart the flomax. You can go up to 2 pills if needed.  We could consider starting a medicaiton called proscar in the future.  Your rash is likely due to dry skin - keep using lotion and the other medicaiton prescribed to you.  Take care,  Dr Jerline Pain

## 2017-11-18 NOTE — Telephone Encounter (Signed)
Pt was seen 11/27 with Dr. Jerline Pain.

## 2017-11-30 ENCOUNTER — Telehealth: Payer: Self-pay | Admitting: Family Medicine

## 2017-11-30 ENCOUNTER — Telehealth: Payer: Self-pay

## 2017-11-30 MED ORDER — ATORVASTATIN CALCIUM 40 MG PO TABS: 40.0000 mg | ORAL_TABLET | Freq: Every day | ORAL | 3 refills | Status: DC

## 2017-11-30 NOTE — Addendum Note (Signed)
Addended by: Tarry Kos on: 11/30/2017 05:02 PM   Modules accepted: Orders

## 2017-11-30 NOTE — Telephone Encounter (Signed)
Copied from [place] [id number]. Topic: Quick Communication - See Telephone Encounter >> Nov 30, 2017  8:47 AM Hewitt Shorts wrote: CRM for notification. See Telephone encounter for: pt wife is calling about the status of the atorvastin  this has been going on for several days   Best number [phone]   11/30/17.

## 2017-11-30 NOTE — Telephone Encounter (Signed)
Copied from [place] [phone]. Topic: Quick Communication - See Telephone Encounter >> Nov 30, 2017  8:47 AM Hewitt Shorts wrote: CRM for notification. See Telephone encounter for: pt wife is calling about the status of the atorvastin  this has been going on for several days   Best number [phone]   11/30/17. >> Nov 30, 2017 11:40 AM Oliver Pila B wrote: Pt called to check status

## 2017-12-02 NOTE — Telephone Encounter (Signed)
Called patient but no answer received

## 2017-12-06 NOTE — Telephone Encounter (Signed)
Called and spoke to patient who states that his wife is going to try to pick up his prescription today from [place]. I instructed him that we had sent in his prescription on 11/30/17 to [facility]. Patient confirmed this is the correct pharmacy. He will let us know if medication is available. Patient request to transfer to new [place] location

## 2017-12-06 NOTE — Telephone Encounter (Signed)
im ok with transfer but tell him we will miss him and it was an honor to be his physician

## 2017-12-15 ENCOUNTER — Telehealth: Payer: Self-pay

## 2017-12-15 NOTE — Telephone Encounter (Signed)
Call to the patient to schedule AWV for this year Stated he does not want to do this again and will schedule with Dr. Yong Channel when he feels he needs too Therapist, art

## 2018-01-10 ENCOUNTER — Telehealth: Payer: Self-pay | Admitting: Family Medicine

## 2018-01-10 NOTE — Telephone Encounter (Signed)
SH-Plz see patient's note/he would like to change to [facility] due to the convenience of location/Plz advise if you are in agreement/thx DJ

## 2018-01-10 NOTE — Telephone Encounter (Signed)
Im ok with transfer- please let patient know I will miss him and greatly appreciate him allowing me to care for him the last few years.

## 2018-01-10 NOTE — Telephone Encounter (Signed)
Patient requesting to transfer care to [facility] located on [employer]. Out of courtesy for both practices an approval is needed from Dr. Yong Channel and the [facility] practice. Please respond with an approval or denial of transfer.  I am sending this to [facility] as well to send to their providers due to the patient not specifying which provider.   Copied from [place]. Topic: Appointment Scheduling - Scheduling Inquiry for Clinic >> Jan 10, 2018 11:52 AM Synthia Innocent wrote: Reason for CRM: Would like to transfer from Dr Yong Channel to [facility], closer to his home. Please advise

## 2018-03-02 ENCOUNTER — Ambulatory Visit: Payer: Medicare Other | Admitting: Family Medicine

## 2018-03-06 ENCOUNTER — Ambulatory Visit: Payer: Medicare Other | Admitting: Family Medicine

## 2018-03-10 ENCOUNTER — Other Ambulatory Visit: Payer: Self-pay

## 2018-03-10 MED ORDER — AMLODIPINE BESYLATE 10 MG PO TABS: 10.0000 mg | ORAL_TABLET | Freq: Every day | ORAL | 1 refills | Status: DC

## 2018-03-13 ENCOUNTER — Other Ambulatory Visit: Payer: Self-pay

## 2018-03-13 MED ORDER — AMLODIPINE BESYLATE 10 MG PO TABS: 10.0000 mg | ORAL_TABLET | Freq: Every day | ORAL | 1 refills | Status: DC

## 2018-03-15 ENCOUNTER — Ambulatory Visit (INDEPENDENT_AMBULATORY_CARE_PROVIDER_SITE_OTHER): Payer: Medicare Other | Admitting: Family Medicine

## 2018-03-15 ENCOUNTER — Encounter: Payer: Self-pay | Admitting: Family Medicine

## 2018-03-15 VITALS — BP 130/80 | HR 52 | Ht 68.0 in | Wt 180.4 lb

## 2018-03-15 DIAGNOSIS — N138 Other obstructive and reflux uropathy: Secondary | ICD-10-CM | POA: Diagnosis not present

## 2018-03-15 DIAGNOSIS — R972 Elevated prostate specific antigen [PSA]: Secondary | ICD-10-CM

## 2018-03-15 DIAGNOSIS — N401 Enlarged prostate with lower urinary tract symptoms: Secondary | ICD-10-CM | POA: Diagnosis not present

## 2018-03-15 DIAGNOSIS — E785 Hyperlipidemia, unspecified: Secondary | ICD-10-CM | POA: Diagnosis not present

## 2018-03-15 DIAGNOSIS — E119 Type 2 diabetes mellitus without complications: Secondary | ICD-10-CM

## 2018-03-15 DIAGNOSIS — I10 Essential (primary) hypertension: Secondary | ICD-10-CM

## 2018-03-15 DIAGNOSIS — E1159 Type 2 diabetes mellitus with other circulatory complications: Secondary | ICD-10-CM

## 2018-03-15 DIAGNOSIS — M174 Other bilateral secondary osteoarthritis of knee: Secondary | ICD-10-CM | POA: Diagnosis not present

## 2018-03-15 DIAGNOSIS — K5901 Slow transit constipation: Secondary | ICD-10-CM | POA: Diagnosis not present

## 2018-03-15 LAB — COMPREHENSIVE METABOLIC PANEL
ALT: 18 U/L (ref 0–53)
AST: 18 U/L (ref 0–37)
Albumin: 3.9 g/dL (ref 3.5–5.2)
Alkaline Phosphatase: 176 U/L — ABNORMAL HIGH (ref 39–117)
BUN: 14 mg/dL (ref 6–23)
CHLORIDE: 107 meq/L (ref 96–112)
CO2: 31 meq/L (ref 19–32)
Calcium: 9.6 mg/dL (ref 8.4–10.5)
Creatinine, Ser: 0.81 mg/dL (ref 0.40–1.50)
GFR: 119.49 mL/min (ref >60.00)
Glucose, Bld: 139 mg/dL — ABNORMAL HIGH (ref 70–99)
Potassium: 4.6 mEq/L (ref 3.5–5.1)
Sodium: 142 mEq/L (ref 135–145)
Total Bilirubin: 0.8 mg/dL (ref 0.2–1.2)
Total Protein: 6.8 g/dL (ref 6.0–8.3)

## 2018-03-15 LAB — CBC
HCT: 40.7 % (ref 39.0–52.0)
HEMOGLOBIN: 13.6 g/dL (ref 13.0–17.0)
MCHC: 33.4 g/dL (ref 30.0–36.0)
MCV: 98 fl (ref 78.0–100.0)
Platelets: 236 10*3/uL (ref 150.0–400.0)
RBC: 4.16 Mil/uL — ABNORMAL LOW (ref 4.22–5.81)
RDW: 13.2 % (ref 11.5–15.5)
WBC: 4.5 10*3/uL (ref 4.0–10.5)

## 2018-03-15 LAB — HEMOGLOBIN A1C: HEMOGLOBIN A1C: 5.7 % (ref 4.6–6.5)

## 2018-03-15 LAB — LIPID PANEL
CHOL/HDL RATIO: 3
Cholesterol: 139 mg/dL (ref 0–200)
HDL: 46.3 mg/dL (ref >39.00)
LDL CALC: 77 mg/dL (ref 0–99)
NonHDL: 92.48
TRIGLYCERIDES: 77 mg/dL (ref 0.0–149.0)
VLDL: 15.4 mg/dL (ref 0.0–40.0)

## 2018-03-15 LAB — TSH: TSH: 1.03 u[IU]/mL (ref 0.35–4.50)

## 2018-03-15 LAB — URINALYSIS, ROUTINE W REFLEX MICROSCOPIC
Bilirubin Urine: NEGATIVE
HGB URINE DIPSTICK: NEGATIVE
Ketones, ur: NEGATIVE
Leukocytes, UA: NEGATIVE
NITRITE: NEGATIVE
RBC / HPF: NONE SEEN (ref 0–?)
Specific Gravity, Urine: 1.025 (ref 1.000–1.030)
Total Protein, Urine: NEGATIVE
URINE GLUCOSE: NEGATIVE
Urobilinogen, UA: 0.2 (ref 0.0–1.0)
pH: 6 (ref 5.0–8.0)

## 2018-03-15 LAB — MICROALBUMIN / CREATININE URINE RATIO
Creatinine,U: 166.7 mg/dL
MICROALB UR: 6.7 mg/dL — AB (ref 0.0–1.9)
Microalb Creat Ratio: 4 mg/g (ref 0.0–30.0)

## 2018-03-15 LAB — PSA: PSA: 4.05 ng/mL — ABNORMAL HIGH (ref 0.10–4.00)

## 2018-03-15 MED ORDER — TAMSULOSIN HCL 0.4 MG PO CAPS: 0.4000 mg | ORAL_CAPSULE | Freq: Every day | ORAL | 3 refills | Status: DC

## 2018-03-15 MED ORDER — POLYETHYLENE GLYCOL 3350 17 GM/SCOOP PO POWD: 17.0000 g | Freq: Two times a day (BID) | ORAL | 1 refills | Status: DC | PRN

## 2018-03-15 MED ORDER — AMLODIPINE BESYLATE 10 MG PO TABS: 10.0000 mg | ORAL_TABLET | Freq: Every day | ORAL | 1 refills | Status: DC

## 2018-03-15 MED ORDER — ATORVASTATIN CALCIUM 40 MG PO TABS: 40.0000 mg | ORAL_TABLET | Freq: Every day | ORAL | 3 refills | Status: DC

## 2018-03-15 NOTE — Patient Instructions (Signed)
Constipation, Adult Constipation is when a person:  Poops (has a bowel movement) fewer times in a week than normal.  Has a hard time pooping.  Has poop that is dry, hard, or bigger than normal.  Follow these instructions at home: Eating and drinking   Eat foods that have a lot of fiber, such as: ? Fresh fruits and vegetables. ? Whole grains. ? Beans.  Eat less of foods that are high in fat, low in fiber, or overly processed, such as: ? [place] fries. ? Hamburgers. ? Cookies. ? Candy. ? Soda.  Drink enough fluid to keep your pee (urine) clear or pale yellow. General instructions  Exercise regularly or as told by your doctor.  Go to the restroom when you feel like you need to poop. Do not hold it in.  Take over-the-counter and prescription medicines only as told by your doctor. These include any fiber supplements.  Do pelvic floor retraining exercises, such as: ? Doing deep breathing while relaxing your lower belly (abdomen). ? Relaxing your pelvic floor while pooping.  Watch your condition for any changes.  Keep all follow-up visits as told by your doctor. This is important. Contact a doctor if:  You have pain that gets worse.  You have a fever.  You have not pooped for 4 days.  You throw up (vomit).  You are not hungry.  You lose weight.  You are bleeding from the anus.  You have thin, pencil-like poop (stool). Get help right away if:  You have a fever, and your symptoms suddenly get worse.  You leak poop or have blood in your poop.  Your belly feels hard or bigger than normal (is bloated).  You have very bad belly pain.  You feel dizzy or you faint. This information is not intended to replace advice given to you by your health care provider. Make sure you discuss any questions you have with your health care provider. Document Released: 05/24/2008 Document Revised: 06/25/2016 Document Reviewed: 05/26/2016 Elsevier Interactive Patient Education   2018 [employer].  High-Fiber Diet Fiber, also called dietary fiber, is a type of carbohydrate found in fruits, vegetables, whole grains, and beans. A high-fiber diet can have many health benefits. Your health care provider may recommend a high-fiber diet to help:  Prevent constipation. Fiber can make your bowel movements more regular.  Lower your cholesterol.  Relieve hemorrhoids, uncomplicated diverticulosis, or irritable bowel syndrome.  Prevent overeating as part of a weight-loss plan.  Prevent heart disease, type 2 diabetes, and certain cancers.  What is my plan? The recommended daily intake of fiber includes:  38 grams for men under age 48.  13 grams for men over age 49.  63 grams for women under age 14.  29 grams for women over age 85.  You can get the recommended daily intake of dietary fiber by eating a variety of fruits, vegetables, grains, and beans. Your health care provider may also recommend a fiber supplement if it is not possible to get enough fiber through your diet. What do I need to know about a high-fiber diet?  Fiber supplements have not been widely studied for their effectiveness, so it is better to get fiber through food sources.  Always check the fiber content on thenutrition facts label of any prepackaged food. Look for foods that contain at least 5 grams of fiber per serving.  Ask your dietitian if you have questions about specific foods that are related to your condition, especially if those foods  are not listed in the following section.  Increase your daily fiber consumption gradually. Increasing your intake of dietary fiber too quickly may cause bloating, cramping, or gas.  Drink plenty of water. Water helps you to digest fiber. What foods can I eat? Grains Whole-grain breads. Multigrain cereal. Oats and oatmeal. Brown rice. Barley. Bulgur wheat. [place]. Bran muffins. Popcorn. Rye wafer crackers. Vegetables Sweet potatoes. Spinach. Kale.  Artichokes. Cabbage. Broccoli. Green peas. Carrots. Squash. Fruits Berries. Pears. Apples. Oranges. Avocados. Prunes and raisins. Dried figs. Meats and Other Protein Sources [place], kidney, pinto, and soy beans. Split peas. Lentils. Nuts and seeds. Dairy Fiber-fortified yogurt. Beverages Fiber-fortified soy milk. Fiber-fortified orange juice. Other Fiber bars. The items listed above may not be a complete list of recommended foods or beverages. Contact your dietitian for more options. What foods are not recommended? Grains White bread. Pasta made with refined flour. White rice. Vegetables Fried potatoes. Canned vegetables. Well-cooked vegetables. Fruits Fruit juice. Cooked, strained fruit. Meats and Other Protein Sources Fatty cuts of meat. Fried Sales executive or fried fish. Dairy Milk. Yogurt. Cream cheese. Sour cream. Beverages Soft drinks. Other Cakes and pastries. Butter and oils. The items listed above may not be a complete list of foods and beverages to avoid. Contact your dietitian for more information. What are some tips for including high-fiber foods in my diet?  Eat a wide variety of high-fiber foods.  Make sure that half of all grains consumed each day are whole grains.  Replace breads and cereals made from refined flour or white flour with whole-grain breads and cereals.  Replace white rice with brown rice, bulgur wheat, or millet.  Start the day with a breakfast that is high in fiber, such as a cereal that contains at least 5 grams of fiber per serving.  Use beans in place of meat in soups, salads, or pasta.  Eat high-fiber snacks, such as berries, raw vegetables, nuts, or popcorn. This information is not intended to replace advice given to you by your health care provider. Make sure you discuss any questions you have with your health care provider. Document Released: 12/06/2005 Document Revised: 05/13/2016 Document Reviewed: 05/21/2014 Elsevier Interactive Patient  Education  2018 [employer].  Knee Exercises Ask your health care provider which exercises are safe for you. Do exercises exactly as told by your health care provider and adjust them as directed. It is normal to feel mild stretching, pulling, tightness, or discomfort as you do these exercises, but you should stop right away if you feel sudden pain or your pain gets worse.Do not begin these exercises until told by your health care provider. STRETCHING AND RANGE OF MOTION EXERCISES These exercises warm up your muscles and joints and improve the movement and flexibility of your knee. These exercises also help to relieve pain, numbness, and tingling. Exercise A: Knee Extension, Prone 1. Lie on your abdomen on a bed. 2. Place your left / right knee just beyond the edge of the surface so your knee is not on the bed. You can put a towel under your left / right thigh just above your knee for comfort. 3. Relax your leg muscles and allow gravity to straighten your knee. You should feel a stretch behind your left / right knee. 4. Hold this position for __________ seconds. 5. Scoot up so your knee is supported between repetitions. Repeat __________ times. Complete this stretch __________ times a day. Exercise B: Knee Flexion, Active  1. Lie on your back with both knees straight.  If this causes back discomfort, bend your left / right knee so your foot is flat on the floor. 2. Slowly slide your left / right heel back toward your buttocks until you feel a gentle stretch in the front of your knee or thigh. 3. Hold this position for __________ seconds. 4. Slowly slide your left / right heel back to the starting position. Repeat __________ times. Complete this exercise __________ times a day. Exercise C: Quadriceps, Prone  1. Lie on your abdomen on a firm surface, such as a bed or padded floor. 2. Bend your left / right knee and hold your ankle. If you cannot reach your ankle or pant leg, loop a belt around  your foot and grab the belt instead. 3. Gently pull your heel toward your buttocks. Your knee should not slide out to the side. You should feel a stretch in the front of your thigh and knee. 4. Hold this position for __________ seconds. Repeat __________ times. Complete this stretch __________ times a day. Exercise D: Hamstring, Supine 1. Lie on your back. 2. Loop a belt or towel over the ball of your left / right foot. The ball of your foot is on the walking surface, right under your toes. 3. Straighten your left / right knee and slowly pull on the belt to raise your leg until you feel a gentle stretch behind your knee. ? Do not let your left / right knee bend while you do this. ? Keep your other leg flat on the floor. 4. Hold this position for __________ seconds. Repeat __________ times. Complete this stretch __________ times a day. STRENGTHENING EXERCISES These exercises build strength and endurance in your knee. Endurance is the ability to use your muscles for a long time, even after they get tired. Exercise E: Quadriceps, Isometric  1. Lie on your back with your left / right leg extended and your other knee bent. Put a rolled towel or small pillow under your knee if told by your health care provider. 2. Slowly tense the muscles in the front of your left / right thigh. You should see your kneecap slide up toward your hip or see increased dimpling just above the knee. This motion will push the back of the knee toward the floor. 3. For __________ seconds, keep the muscle as tight as you can without increasing your pain. 4. Relax the muscles slowly and completely. Repeat __________ times. Complete this exercise __________ times a day. Exercise F: Straight Leg Raises - Quadriceps 1. Lie on your back with your left / right leg extended and your other knee bent. 2. Tense the muscles in the front of your left / right thigh. You should see your kneecap slide up or see increased dimpling just above  the knee. Your thigh may even shake a bit. 3. Keep these muscles tight as you raise your leg 4-6 inches (10-15 cm) off the floor. Do not let your knee bend. 4. Hold this position for __________ seconds. 5. Keep these muscles tense as you lower your leg. 6. Relax your muscles slowly and completely after each repetition. Repeat __________ times. Complete this exercise __________ times a day. Exercise G: Hamstring, Isometric 1. Lie on your back on a firm surface. 2. Bend your left / right knee approximately __________ degrees. 3. Dig your left / right heel into the surface as if you are trying to pull it toward your buttocks. Tighten the muscles in the back of your thighs to dig as hard  as you can without increasing any pain. 4. Hold this position for __________ seconds. 5. Release the tension gradually and allow your muscles to relax completely for __________ seconds after each repetition. Repeat __________ times. Complete this exercise __________ times a day. Exercise H: Hamstring Curls  If told by your health care provider, do this exercise while wearing ankle weights. Begin with __________ weights. Then increase the weight by 1 lb (0.5 kg) increments. Do not wear ankle weights that are more than __________. 1. Lie on your abdomen with your legs straight. 2. Bend your left / right knee as far as you can without feeling pain. Keep your hips flat against the floor. 3. Hold this position for __________ seconds. 4. Slowly lower your leg to the starting position.  Repeat __________ times. Complete this exercise __________ times a day. Exercise I: Squats (Quadriceps) 1. Stand in front of a table, with your feet and knees pointing straight ahead. You may rest your hands on the table for balance but not for support. 2. Slowly bend your knees and lower your hips like you are going to sit in a chair. ? Keep your weight over your heels, not over your toes. ? Keep your lower legs upright so they are  parallel with the table legs. ? Do not let your hips go lower than your knees. ? Do not bend lower than told by your health care provider. ? If your knee pain increases, do not bend as low. 3. Hold the squat position for __________ seconds. 4. Slowly push with your legs to return to standing. Do not use your hands to pull yourself to standing. Repeat __________ times. Complete this exercise __________ times a day. Exercise J: Wall Slides (Quadriceps)  1. Lean your back against a smooth wall or door while you walk your feet out 18-24 inches (46-61 cm) from it. 2. Place your feet hip-width apart. 3. Slowly slide down the wall or door until your knees bend __________ degrees. Keep your knees over your heels, not over your toes. Keep your knees in line with your hips. 4. Hold for __________ seconds. Repeat __________ times. Complete this exercise __________ times a day. Exercise K: Straight Leg Raises - Hip Abductors 1. Lie on your side with your left / right leg in the top position. Lie so your head, shoulder, knee, and hip line up. You may bend your bottom knee to help you keep your balance. 2. Roll your hips slightly forward so your hips are stacked directly over each other and your left / right knee is facing forward. 3. Leading with your heel, lift your top leg 4-6 inches (10-15 cm). You should feel the muscles in your outer hip lifting. ? Do not let your foot drift forward. ? Do not let your knee roll toward the ceiling. 4. Hold this position for __________ seconds. 5. Slowly return your leg to the starting position. 6. Let your muscles relax completely after each repetition. Repeat __________ times. Complete this exercise __________ times a day. Exercise L: Straight Leg Raises - Hip Extensors 1. Lie on your abdomen on a firm surface. You can put a pillow under your hips if that is more comfortable. 2. Tense the muscles in your buttocks and lift your left / right leg about 4-6 inches  (10-15 cm). Keep your knee straight as you lift your leg. 3. Hold this position for __________ seconds. 4. Slowly lower your leg to the starting position. 5. Let your leg relax completely after  each repetition. Repeat __________ times. Complete this exercise __________ times a day. This information is not intended to replace advice given to you by your health care provider. Make sure you discuss any questions you have with your health care provider. Document Released: 10/20/2005 Document Revised: 08/30/2016 Document Reviewed: 10/12/2015 Elsevier Interactive Patient Education  2018 [employer].

## 2018-03-15 NOTE — Progress Notes (Addendum)
Subjective:  Patient ID: Tanner Griffin, male    DOB: 02-Feb-1941  Age: 77 y.o. MRN: [id number]  CC: Establish Care   HPI Tanner Griffin presents for follow-up of his elevated cholesterol, hypertension and BPH symptoms.  His cholesterol and blood pressure been well controlled on his current regimen.  There have been affordability problems with the Flomax and patient has been taking it every other day to try to stretch out the prescription.  His urine flow has suffered on account of this.  We discussed good Rx and I showed him how to look up his medicine and a coupon that would make it affordable for him.  He has chronic constipation and has not tried MiraLAX.  He is status post bilateral knee replacement and is needing to see an arthritic doctor he tells me.  He has arthritis in his hands as well.  He is accompanied by his wife today.  He stays active by going through neighborhoods and picking out items that have been discarded.   History Tanner Griffin has a past medical history of Arthritis, Arthrofibrosis of total knee arthroplasty, right (03/26/2014), Constipation, Headache, HYPERLIPIDEMIA, HYPERTENSION, Joint pain, Joint swelling, Nocturia, Osteoarthritis of left knee (03/26/2014), Pneumonia, and Urinary frequency.   He has a past surgical history that includes Total knee arthroplasty (2011); Total knee arthroplasty (Left, 03/26/2014); Total knee arthroplasty (Left, 03/26/2014); Knee Closed Reduction (Right, 03/26/2014); Colonoscopy; Hernia repair; Inguinal hernia repair (Right, 04/14/2017); and Insertion of mesh (Right, 04/14/2017).   His family history includes Brain cancer in his father; Renal Disease in his brother.He reports that he has never smoked. He has never used smokeless tobacco. He reports that he does not drink alcohol or use drugs.  Outpatient Medications Prior to Visit  Medication Sig Dispense Refill  . amLODipine (NORVASC) 10 MG tablet Take 1 tablet (10 mg total) by mouth daily. 90 tablet 1   . aspirin EC 81 MG tablet Take 81 mg by mouth daily.    [name] atorvastatin (LIPITOR) 40 MG tablet Take 1 tablet (40 mg total) by mouth daily. 90 tablet 3  . tamsulosin (FLOMAX) 0.4 MG CAPS capsule Take 1 capsule (0.4 mg total) by mouth daily. 90 capsule 3  . diclofenac sodium (VOLTAREN) 1 % GEL Apply 2 g topically 4 (four) times daily. 100 g 3  . Multiple Vitamin (MULTIVITAMIN WITH MINERALS) TABS tablet Take 1 tablet by mouth daily.     No facility-administered medications prior to visit.     ROS Review of Systems  Constitutional: Negative for activity change, fatigue and fever.  HENT: Negative.   Eyes: Negative.   Respiratory: Negative.   Cardiovascular: Negative.   Gastrointestinal: Negative.   Endocrine: Negative for polyphagia and polyuria.  Genitourinary: Negative for difficulty urinating and urgency.  Musculoskeletal: Positive for arthralgias and gait problem.  Skin: Negative for pallor and rash.  Allergic/Immunologic: Negative for immunocompromised state.  Neurological: Negative for weakness and headaches.  Hematological: Negative.  Does not bruise/bleed easily.  Psychiatric/Behavioral: Negative.     Objective:  BP 130/80 (BP Location: Right Arm, Patient Position: Sitting, Cuff Size: Normal)   Pulse (!) 52   Ht 5\' 8"  (1.727 m)   Wt 180 lb 6 oz (81.8 kg)   SpO2 99%   BMI 27.43 kg/m   Physical Exam  Constitutional: He is oriented to person, place, and time. He appears well-developed and well-nourished. No distress.  HENT:  Head: Normocephalic and atraumatic.  Right Ear: External ear normal.  Left Ear: External  ear normal.  Mouth/Throat: Oropharynx is clear and moist. No oropharyngeal exudate.  Eyes: Pupils are equal, round, and reactive to light. Conjunctivae are normal. Right eye exhibits no discharge. Left eye exhibits no discharge. No scleral icterus.  Neck: Neck supple. No JVD present. No tracheal deviation present. No thyromegaly present.  Cardiovascular: Normal  rate, regular rhythm and normal heart sounds.  Pulmonary/Chest: Effort normal and breath sounds normal. No stridor.  Abdominal: Soft. Bowel sounds are normal. He exhibits no distension. There is no tenderness. There is no rebound and no guarding.  Genitourinary: Rectal exam shows no external hemorrhoid, no internal hemorrhoid, no fissure, no mass, no tenderness, anal tone normal and guaiac negative stool. Prostate is enlarged. Prostate is not tender.  Lymphadenopathy:    He has no cervical adenopathy.  Neurological: He is alert and oriented to person, place, and time.  Skin: Skin is warm and dry. He is not diaphoretic.  Psychiatric: He has a normal mood and affect. His behavior is normal.      Assessment & Plan:   Tanner Griffin was seen today for establish care.  Diagnoses and all orders for this visit:  Hypertension associated with diabetes ([place]) -     amLODipine (NORVASC) 10 MG tablet; Take 1 tablet (10 mg total) by mouth daily. -     CBC -     Comprehensive metabolic panel -     TSH -     Urinalysis, Routine w reflex microscopic  Type II diabetes mellitus, well controlled ([facility]) -     CBC -     Comprehensive metabolic panel -     Urinalysis, Routine w reflex microscopic -     Microalbumin / creatinine urine ratio -     Hemoglobin A1c  Other secondary osteoarthritis of both knees -     Ambulatory referral to Sports Medicine  BPH with obstruction/lower urinary tract symptoms -     tamsulosin (FLOMAX) 0.4 MG CAPS capsule; Take 1 capsule (0.4 mg total) by mouth daily. -     PSA -     Urinalysis, Routine w reflex microscopic  Hyperlipidemia, unspecified hyperlipidemia type -     atorvastatin (LIPITOR) 40 MG tablet; Take 1 tablet (40 mg total) by mouth daily. -     Comprehensive metabolic panel -     Lipid panel  Slow transit constipation -     TSH -     polyethylene glycol powder (GLYCOLAX/MIRALAX) powder; Take 17 g by mouth 2 (two) times daily as needed.  Elevated PSA   I  have discontinued Tanner Griffin. Tanner Griffin's aspirin EC, multivitamin with minerals, and diclofenac sodium. I am also having him start on polyethylene glycol powder. Additionally, I am having him maintain his amLODipine, atorvastatin, and tamsulosin.  Meds ordered this encounter  Medications  . amLODipine (NORVASC) 10 MG tablet    Sig: Take 1 tablet (10 mg total) by mouth daily.    Dispense:  90 tablet    Refill:  1  . atorvastatin (LIPITOR) 40 MG tablet    Sig: Take 1 tablet (40 mg total) by mouth daily.    Dispense:  90 tablet    Refill:  3  . tamsulosin (FLOMAX) 0.4 MG CAPS capsule    Sig: Take 1 capsule (0.4 mg total) by mouth daily.    Dispense:  90 capsule    Refill:  3  . polyethylene glycol powder (GLYCOLAX/MIRALAX) powder    Sig: Take 17 g by mouth 2 (two) times daily as  needed.    Dispense:  3350 g    Refill:  1   PSA came back slightly elevated.  This could be secondary to his untreated BPH.  We will follow-up in 3 months and we will recheck it.  Follow-up: Return in about 3 months (around 06/15/2018).  Libby Maw, MD

## 2018-03-16 DIAGNOSIS — R972 Elevated prostate specific antigen [PSA]: Secondary | ICD-10-CM | POA: Insufficient documentation

## 2018-03-29 ENCOUNTER — Ambulatory Visit (INDEPENDENT_AMBULATORY_CARE_PROVIDER_SITE_OTHER): Payer: Medicare Other | Admitting: Family Medicine

## 2018-03-29 ENCOUNTER — Encounter: Payer: Self-pay | Admitting: Family Medicine

## 2018-03-29 ENCOUNTER — Ambulatory Visit (INDEPENDENT_AMBULATORY_CARE_PROVIDER_SITE_OTHER): Payer: Medicare Other

## 2018-03-29 VITALS — BP 122/66 | HR 62 | Temp 98.4°F | Ht 68.0 in | Wt 176.0 lb

## 2018-03-29 DIAGNOSIS — Z96651 Presence of right artificial knee joint: Secondary | ICD-10-CM | POA: Diagnosis not present

## 2018-03-29 DIAGNOSIS — M25561 Pain in right knee: Secondary | ICD-10-CM

## 2018-03-29 DIAGNOSIS — M79642 Pain in left hand: Secondary | ICD-10-CM

## 2018-03-29 DIAGNOSIS — T8484XD Pain due to internal orthopedic prosthetic devices, implants and grafts, subsequent encounter: Secondary | ICD-10-CM | POA: Diagnosis not present

## 2018-03-29 DIAGNOSIS — M79641 Pain in right hand: Secondary | ICD-10-CM

## 2018-03-29 NOTE — Assessment & Plan Note (Addendum)
Pain is acute on chronic in nature. No dislocation.  - pennsaid  - counseled on HEP  - could consider referral to PT or surgery for consult about revision. Would consider Dr. Mayer Camel.

## 2018-03-29 NOTE — Assessment & Plan Note (Signed)
Pain likely related to OA in the DIP and PIP joints  - provided pennsaid sample  - counseled on supportive care  - f/u PRN

## 2018-03-29 NOTE — Patient Instructions (Addendum)
Please try the exercises  Please try the rub on medicine to help with the pain  Take tylenol 650 mg three times a day is the best evidence based medicine we have for arthritis.   These are other things you can try for arthritis.  Glucosamine sulfate 750mg  twice a day is a supplement that has been shown to help moderate to severe arthritis. Vitamin D 2000 IU daily Fish oil 2 grams daily.  Tumeric 500mg  twice daily.  Capsaicin topically up to four times a day may also help with pain.

## 2018-03-29 NOTE — Progress Notes (Signed)
Tanner Griffin - 77 y.o. male MRN [id number]  Date of birth: 11/20/1941  SUBJECTIVE:  Including CC & ROS.  Chief Complaint  Patient presents with  . Right knee pain  . Bilatral hand pain    Tanner Griffin is a 77 y.o. male that is presenting with right knee pain. Pain has is chronic in nature, increasing over the past two months. He has had left total knee arthoplasty done in 2015, TKA of right in 2011. He ambulates with a cane daily. Pain is located the lateral and medial aspect of his right knee. Pain is constant, described as mild to severe. Admits to decrease range of motion. Denies injury. He takes Tylenol for the pain. He has to wear a brace. He has pain upon standing. He has limited flexion.   Bilateral hand pain is chronic, increasing over the past two months. Admits to weakness in his hands. Pain is described as achy tingling sensation. He has a hard time grasping objects. He states the pain is constant. The pain is occurring in the PIP and DIP of his left and right hand. No injury or trauma. He reports the pain is worse with cold   Independent review of right knee x-ray from 5/8/17shows a total knee replacement with suggestions of internal loose bodies.  Review of Systems  Constitutional: Negative for fever.  HENT: Negative for congestion.   Respiratory: Negative for cough.   Cardiovascular: Negative for chest pain.  Gastrointestinal: Negative for abdominal pain.  Musculoskeletal: Positive for arthralgias and gait problem.  Skin: Negative for color change.  Allergic/Immunologic: Negative for immunocompromised state.  Hematological: Negative for adenopathy.  Psychiatric/Behavioral: Negative for agitation.    HISTORY: Past Medical, Surgical, Social, and Family History Reviewed & Updated per EMR.   Pertinent Historical Findings include:  Past Medical History:  Diagnosis Date  . Arthritis   . Arthrofibrosis of total knee arthroplasty, right 03/26/2014  . Constipation    takes Miralax daily as needed  . Headache    occasional  . HYPERLIPIDEMIA    takes Lipitor daily  . HYPERTENSION    takes Amlodipine and Labetalol daily  . Joint pain   . Joint swelling   . Nocturia   . Osteoarthritis of left knee 03/26/2014  . Pneumonia    hx of-as a child  . Urinary frequency    takes Flomax daily    Past Surgical History:  Procedure Laterality Date  . COLONOSCOPY    . HERNIA REPAIR     inguinal 04/14/17  . INGUINAL HERNIA REPAIR Right 04/14/2017   Procedure: RIGHT INGUINAL HERNIA REPAIR WITH MESH;  Surgeon: Armandina Gemma, MD;  Location: [facility];  Service: General;  Laterality: Right;  . INSERTION OF MESH Right 04/14/2017   Procedure: INSERTION OF MESH;  Surgeon: Armandina Gemma, MD;  Location: [facility];  Service: General;  Laterality: Right;  . KNEE CLOSED REDUCTION Right 03/26/2014   Procedure: CLOSED MANIPULATION KNEE;  Surgeon: [name], MD;  Location: [place];  Service: Orthopedics;  Laterality: Right;  . TOTAL KNEE ARTHROPLASTY  2011   right, Dr. [name] per pt  . TOTAL KNEE ARTHROPLASTY Left 03/26/2014   DR LANDAU  . TOTAL KNEE ARTHROPLASTY Left 03/26/2014   Procedure: TOTAL KNEE ARTHROPLASTY;  Surgeon: [name], MD;  Location: [place];  Service: Orthopedics;  Laterality: Left;    Allergies  Allergen Reactions  . Telmisartan Other (See Comments)    Reaction:  Headache     Family History  Problem Relation Age of Onset  . Brain cancer Father   . Renal Disease Brother        ESRD, unknown cause  . Colon cancer Neg Hx   . Stomach cancer Neg Hx   . Esophageal cancer Neg Hx   . Rectal cancer Neg Hx      Social History   Socioeconomic History  . Marital status: Married    Spouse name: Mary  . Number of children: 5  . Years of education: 9th  . Highest education level: Not on file  Occupational History  . Occupation: retired    Comment: Barista  . Financial resource strain: Not on file  . Food insecurity:    Worry: Not  on file    Inability: Not on file  . Transportation needs:    Medical: Not on file    Non-medical: Not on file  Tobacco Use  . Smoking status: Never Smoker  . Smokeless tobacco: Never Used  Substance and Sexual Activity  . Alcohol use: No    Alcohol/week: 0.0 oz  . Drug use: No  . Sexual activity: Yes  Lifestyle  . Physical activity:    Days per week: Not on file    Minutes per session: Not on file  . Stress: Not on file  Relationships  . Social connections:    Talks on phone: Not on file    Gets together: Not on file    Attends religious service: Not on file    Active member of club or organization: Not on file    Attends meetings of clubs or organizations: Not on file    Relationship status: Not on file  . Intimate partner violence:    Fear of current or ex partner: Not on file    Emotionally abused: Not on file    Physically abused: Not on file    Forced sexual activity: Not on file  Other Topics Concern  . Not on file  Social History Narrative   Married, together 91 years in 2016. 1 son, 2 daughters. Can't count # grandkids at least 32, 91. 1 greatgranddaughter      Grandson and daughter live with them      Retired from Architect work      Hobbies: time with grand kids-play basketball     PHYSICAL EXAM:  VS: BP 122/66 (BP Location: Left Arm, Patient Position: Sitting, Cuff Size: Normal)   Pulse 62   Temp 98.4 F (36.9 C) (Oral)   Ht 5\' 8"  (1.727 m)   Wt 176 lb (79.8 kg)   SpO2 98%   BMI 26.76 kg/m  Physical Exam Gen: NAD, alert, cooperative with exam, well-appearing ENT: normal lips, normal nasal mucosa,  Eye: normal EOM, normal conjunctiva and lids CV:  no edema, +2 pedal pulses   Resp: no accessory muscle use, non-labored,  Skin: no rashes, no areas of induration  Neuro: normal tone, normal sensation to touch Psych:  normal insight, alert and oriented MSK:  Right knee:  Well-healed vertical incision on the anterior patella No obvious  effusion No redness Normal and points valgus and varus stress testing Limited flexion to around 95 Normal extension Normal strength to resistance in flexion and extension Walking with a single-point cane and a limp Significant weakness with hip abduction Right left hand Swelling of the PIP and DIP of each hand. No ulnar deviation No redness  Normal flexion and extension of the digits Normal grip strength No signs of  atrophy Normal wrist range of motion Neurovascularly intact  Limited ultrasound: right knee:  Thickening of the QT  Normal PT  No effusion  Medial and lateral joint line have suggestions of chronic changes post op.   Summary: post op changes  Ultrasound and interpretation by Clearance Coots, MD   ASSESSMENT & PLAN:   Pain in both hands Pain likely related to OA in the DIP and PIP joints  - provided pennsaid sample  - counseled on supportive care  - f/u PRN   Pain due to total knee replacement (HCC) Pain is acute on chronic in nature. No dislocation.  - pennsaid  - counseled on HEP  - could consider referral to PT or surgery for consult about revision. Would consider Dr. Mayer Camel.

## 2018-05-10 ENCOUNTER — Ambulatory Visit: Payer: Self-pay

## 2018-05-10 NOTE — Telephone Encounter (Signed)
Pt's wife called to cancel appt for tomorrow. Triaged pt after finding out that pt had fallen 2 days ago. I asked if I could talk to husband and she stated "Oh honey, I don't want to do that. He will cuss you out." According to wife, pt was walking in the yard barefoot and without his cane. He was trying to fill the bird feeder when he fell and injured his left side of his face and left shoulder. She rendered first aid but the pt is still c/o headache. Advised wife that he needs to go to either the ED or [facility]. Wife refused stating, "he wont go anywhere." Advised wife to get emergency care if he has any difficulty walking, talking, weakness, worsening headache or any worsening.  Answer Assessment - Initial Assessment Questions 1. APPEARANCE of INJURY: "What does the injury look like?"     Wife did first aid 2 days ago. Wife stated that the cut to the left side of the  face looks quarter sized and the cut to his left shoulder is 1.5 inches in length.  2. SIZE: "How large is the cut?"      Face: quarter sized and left shoulder 1.5 inches long 3. BLEEDING: "Is it bleeding now?" If so, ask: "Is it difficult to stop?"      No First aid was rendered 2 days ago 4. LOCATION: "Where is the injury located?"      Left cheek laceration and left shoulder laceration 5. ONSET: "How long ago did the injury occur?"      2 days ago 6. MECHANISM: "Tell me how it happened."      Pt was walking without his cane and without shoes on and he was walking in the yard filling the bird feeder and fell 7. TETANUS: "When was the last tetanus booster?"     2010 8. PREGNANCY: "Is there any chance you are pregnant?" "When was your last menstrual period?"     n/a  Protocols used: [place]

## 2018-05-11 ENCOUNTER — Ambulatory Visit: Payer: Medicare Other | Admitting: Family Medicine

## 2018-05-11 NOTE — Telephone Encounter (Signed)
Fyi, patient canceled appointment for today and patient refused going to urgent care or the ED.

## 2018-05-19 ENCOUNTER — Telehealth: Payer: Self-pay

## 2018-05-19 DIAGNOSIS — M1990 Unspecified osteoarthritis, unspecified site: Secondary | ICD-10-CM

## 2018-05-19 NOTE — Addendum Note (Signed)
Addended by: Kateri Mc E on: 05/19/2018 03:00 PM   Modules accepted: Orders

## 2018-05-19 NOTE — Telephone Encounter (Signed)
Referral placed & patient's wife is aware.

## 2018-05-19 NOTE — Telephone Encounter (Signed)
Okay. Think that his issues are mostly osteoarthritis.

## 2018-05-19 NOTE — Telephone Encounter (Signed)
Okay for referral?    Copied from [place] [id number]. Topic: Referral - Request >> May 19, 2018 11:48 AM Pricilla Handler wrote: Reason for CRM: Patient's wife called wanting Dr. Ethelene Hal to refer patient to Dr. Dr. Sabino Niemann, MD of [facility] [phone]) for Arthritis. Please call patient at [phone].       Thank You!!!

## 2018-05-23 NOTE — Addendum Note (Signed)
Addended by: Kateri Mc E on: 05/23/2018 08:40 AM   Modules accepted: Orders

## 2018-05-23 NOTE — Telephone Encounter (Signed)
Omega from [facility] had called and requested a new referral to be entered.

## 2018-05-25 DIAGNOSIS — R262 Difficulty in walking, not elsewhere classified: Secondary | ICD-10-CM | POA: Diagnosis not present

## 2018-05-25 DIAGNOSIS — M6281 Muscle weakness (generalized): Secondary | ICD-10-CM | POA: Diagnosis not present

## 2018-05-25 DIAGNOSIS — M609 Myositis, unspecified: Secondary | ICD-10-CM | POA: Diagnosis not present

## 2018-05-25 DIAGNOSIS — M1990 Unspecified osteoarthritis, unspecified site: Secondary | ICD-10-CM | POA: Diagnosis not present

## 2018-07-26 ENCOUNTER — Other Ambulatory Visit: Payer: Self-pay

## 2018-07-26 ENCOUNTER — Encounter

## 2018-07-26 ENCOUNTER — Encounter: Payer: Self-pay | Admitting: Neurology

## 2018-07-26 ENCOUNTER — Ambulatory Visit: Payer: Medicare Other | Admitting: Family Medicine

## 2018-07-26 ENCOUNTER — Ambulatory Visit (INDEPENDENT_AMBULATORY_CARE_PROVIDER_SITE_OTHER): Payer: Medicare Other | Admitting: Neurology

## 2018-07-26 VITALS — BP 128/80 | HR 61 | Resp 18 | Ht 68.0 in | Wt 170.5 lb

## 2018-07-26 DIAGNOSIS — M79604 Pain in right leg: Secondary | ICD-10-CM

## 2018-07-26 DIAGNOSIS — T8484XD Pain due to internal orthopedic prosthetic devices, implants and grafts, subsequent encounter: Secondary | ICD-10-CM

## 2018-07-26 DIAGNOSIS — Z96651 Presence of right artificial knee joint: Secondary | ICD-10-CM

## 2018-07-26 DIAGNOSIS — R29898 Other symptoms and signs involving the musculoskeletal system: Secondary | ICD-10-CM | POA: Insufficient documentation

## 2018-07-26 MED ORDER — MELOXICAM 7.5 MG PO TABS: 7.5000 mg | ORAL_TABLET | Freq: Every day | ORAL | 5 refills | Status: DC

## 2018-07-26 NOTE — Progress Notes (Signed)
[facility]  PATIENT: Tanner Griffin DOB: 1941-06-06  REFERRING DOCTOR OR PCP:  Abelino Derrick, Griffin Niemann SOURCE: patient, notes from Dr. Ethelene Hal and Dr. Dossie Der  _________________________________   HISTORICAL  CHIEF COMPLAINT:  Chief Complaint  Patient presents with  . Right Leg Weakness    Tanner Griffin is here with his wife Tanner Griffin for eavl of right leg weakness since right knee replacement 5 yrs. ago/fim    HISTORY OF PRESENT ILLNESS:  I had the pleasure of seeing your patient, Tanner Griffin, at [facility] for neurologic consultation regarding his right leg weakness and difficulty walking.  He is a 77 year old male who had bilateral knee replacements 5 or 6 years ago, first the right (Dr. Gladstone Lighter) and then the left .  The left knee and leg recovered well but he reports that he has had weakness in the right leg ever since the surgery.  He actually notes he did better right after surgery and didn't need a cane for a while.  He felt weaker a year or so later and he now drags the foot.  Additionally, he has a lot of pain in and around the right knee he did many months of physical therapy after the surgery.     He notes numbness in the lateral foot but not the sole.      The knee pain is not present at rest but worsens when he walks.  He denies any foot pain.     He denies any back pain.   He denies neck pain .    He has nocturia x 2 most nights.    He is on tamsulosin.   He was once diagnosed with DM but sugars and recent HgbA1c have been fine.     I reviewed x-rays from 2017.  The knee x-ray 04/26/2016 was negative for acute changes.  There was evidence of the prior arthroplasty but there was no peri-hardware fracture.   Lumbar spine x-rays show severe bilateral foraminal narrowing at L5-S1 and chronic severe degenerative disc changes in the lower lumbar spine.   MRI of the brain 07/12/2014 was normal for age.  He believes he was on an anti-inflammatory in  the past but currently is not taking anything for the pain.  He reports doing physical therapy off and on for about a year between the 2 knee replacements.  He has not come back to see orthopedics anytime recently.  His primary care doctor referred him to rheumatology and he was subsequently referred here.  REVIEW OF SYSTEMS: Constitutional: No fevers, chills, sweats, or change in appetite Eyes: No visual changes, double vision, eye pain Ear, nose and throat: No hearing loss, ear pain, nasal congestion, sore throat Cardiovascular: No chest pain, palpitations Respiratory: No shortness of breath at rest or with exertion.   No wheezes GastrointestinaI: No nausea, vomiting, diarrhea, abdominal pain, fecal incontinence Genitourinary: No dysuria, urinary retention or frequency.  He has nocturia 2-3 times nightly.. Musculoskeletal: No neck pain, back pain.    He has pain in and around the right knee Integumentary: No rash, pruritus, skin lesions Neurological: as above Psychiatric: No depression at this time.  No anxiety Endocrine: No palpitations, diaphoresis, change in appetite, change in weigh or increased thirst Hematologic/Lymphatic: No anemia, purpura, petechiae. Allergic/Immunologic: No itchy/runny eyes, nasal congestion, recent allergic reactions, rashes  ALLERGIES: Allergies  Allergen Reactions  . Telmisartan Other (See Comments)    Reaction:  Headache     HOME MEDICATIONS:  Current Outpatient  Medications:  .  amLODipine (NORVASC) 10 MG tablet, Take 1 tablet (10 mg total) by mouth daily., Disp: 90 tablet, Rfl: 1 .  aspirin EC 81 MG tablet, Take 81 mg by mouth daily., Disp: , Rfl:  .  atorvastatin (LIPITOR) 40 MG tablet, Take 1 tablet (40 mg total) by mouth daily., Disp: 90 tablet, Rfl: 3 .  polyethylene glycol powder (GLYCOLAX/MIRALAX) powder, Take 17 g by mouth 2 (two) times daily as needed., Disp: 3350 g, Rfl: 1 .  tamsulosin (FLOMAX) 0.4 MG CAPS capsule, Take 1 capsule (0.4 mg  total) by mouth daily., Disp: 90 capsule, Rfl: 3  PAST MEDICAL HISTORY: Past Medical History:  Diagnosis Date  . Arthritis   . Arthrofibrosis of total knee arthroplasty, right 03/26/2014  . Constipation    takes Miralax daily as needed  . Headache    occasional  . HYPERLIPIDEMIA    takes Lipitor daily  . HYPERTENSION    takes Amlodipine and Labetalol daily  . Joint pain   . Joint swelling   . Nocturia   . Osteoarthritis of left knee 03/26/2014  . Pneumonia    hx of-as a child  . Urinary frequency    takes Flomax daily    PAST SURGICAL HISTORY: Past Surgical History:  Procedure Laterality Date  . COLONOSCOPY    . HERNIA REPAIR     inguinal 04/14/17  . INGUINAL HERNIA REPAIR Right 04/14/2017   Procedure: RIGHT INGUINAL HERNIA REPAIR WITH MESH;  Surgeon: Armandina Gemma, MD;  Location: [facility];  Service: General;  Laterality: Right;  . INSERTION OF MESH Right 04/14/2017   Procedure: INSERTION OF MESH;  Surgeon: Armandina Gemma, MD;  Location: [facility];  Service: General;  Laterality: Right;  . KNEE CLOSED REDUCTION Right 03/26/2014   Procedure: CLOSED MANIPULATION KNEE;  Surgeon: [name], MD;  Location: [place];  Service: Orthopedics;  Laterality: Right;  . TOTAL KNEE ARTHROPLASTY  2011   right, Dr. [name] per pt  . TOTAL KNEE ARTHROPLASTY Left 03/26/2014   DR LANDAU  . TOTAL KNEE ARTHROPLASTY Left 03/26/2014   Procedure: TOTAL KNEE ARTHROPLASTY;  Surgeon: [name], MD;  Location: [place];  Service: Orthopedics;  Laterality: Left;    FAMILY HISTORY: Family History  Problem Relation Age of Onset  . Brain cancer Father   . Renal Disease Brother        ESRD, unknown cause  . Colon cancer Neg Hx   . Stomach cancer Neg Hx   . Esophageal cancer Neg Hx   . Rectal cancer Neg Hx     SOCIAL HISTORY:  Social History   Socioeconomic History  . Marital status: Married    Spouse name: Tanner Griffin  . Number of children: 5  . Years of education: 9th  . Highest education level: Not on  file  Occupational History  . Occupation: retired    Comment: Barista  . Financial resource strain: Not on file  . Food insecurity:    Worry: Not on file    Inability: Not on file  . Transportation needs:    Medical: Not on file    Non-medical: Not on file  Tobacco Use  . Smoking status: Never Smoker  . Smokeless tobacco: Never Used  Substance and Sexual Activity  . Alcohol use: No    Alcohol/week: 0.0 oz  . Drug use: No  . Sexual activity: Yes  Lifestyle  . Physical activity:    Days per week: Not on file  Minutes per session: Not on file  . Stress: Not on file  Relationships  . Social connections:    Talks on phone: Not on file    Gets together: Not on file    Attends religious service: Not on file    Active member of club or organization: Not on file    Attends meetings of clubs or organizations: Not on file    Relationship status: Not on file  . Intimate partner violence:    Fear of current or ex partner: Not on file    Emotionally abused: Not on file    Physically abused: Not on file    Forced sexual activity: Not on file  Other Topics Concern  . Not on file  Social History Narrative   Married, together 75 years in 2016. 1 son, 2 daughters. Can't count # grandkids at least 103, 73. 1 greatgranddaughter      Grandson and daughter live with them      Retired from Architect work      Hobbies: time with grand kids-play basketball     PHYSICAL EXAM  Vitals:   07/26/18 1038  BP: 128/80  Pulse: 61  Resp: 18  Weight: 170 lb 8 oz (77.3 kg)  Height: 5\' 8"  (1.727 m)    Body mass index is 25.92 kg/m.   General: The patient is well-developed and well-nourished and in no acute distress  Eyes:  Funduscopic exam shows normal optic discs and retinal vessels.  Neck: The neck is supple, no carotid bruits are noted.  The neck is nontender.  Cardiovascular: The heart has a regular rate and rhythm with a normal S1 and S2. There were no  murmurs, gallops or rubs. Lungs are clear to auscultation.  Skin: Extremities are without significant edema.  Musculoskeletal: Back is nontender.  He has surgical scars of the knees.  He has slight tenderness behind the right knee and no tenderness at the left knee.  Range of motion appeared normal in the knee.  Neurologic Exam  Mental status: The patient is alert and oriented x 3 at the time of the examination. The patient has apparent normal recent and remote memory, with an apparently normal attention span and concentration ability.   Speech is normal.  Cranial nerves: Extraocular movements are full. Pupils are equal, round, and reactive to light and accomodation.  Visual fields are full.  Facial symmetry is present. There is good facial sensation to soft touch bilaterally.Facial strength is normal.  Trapezius and sternocleidomastoid strength is normal. No dysarthria is noted.  The tongue is midline, and the patient has symmetric elevation of the soft palate. No obvious hearing deficits are noted.  Motor:  Muscle bulk is normal.   Tone is normal. Strength is  5 / 5 in the arms and proximal legs and 4+/5 in the EHL muscles bilaterally.  He is able to stand on his toes and his heels.  Sensory: He had normal sensation to touch in vibration and temperature in the hands.  He has slight reduced vibration sensation at the toes but fairly normal sensation at the ankles.  He felt sensation was minimally better at the left ankle than the right ankle.  Touch sensation was normal in the feet.  Coordination: Cerebellar testing reveals good finger-nose-finger and heel-to-shin bilaterally.  Gait and station: Station is normal.   The gait is arthritic and he has difficulty doing a tandem gait.  There is a slight right foot drop.. Romberg is negative.  Reflexes: Deep tendon reflexes are symmetric and normal bilaterally.   Plantar responses are flexor.    DIAGNOSTIC DATA (LABS, IMAGING, TESTING) - I  reviewed patient records, labs, notes, testing and imaging myself where available.  Lab Results  Component Value Date   WBC 4.5 03/15/2018   HGB 13.6 03/15/2018   HCT 40.7 03/15/2018   MCV 98.0 03/15/2018   PLT 236.0 03/15/2018      Component Value Date/Time   NA 142 03/15/2018 0953   K 4.6 03/15/2018 0953   CL 107 03/15/2018 0953   CO2 31 03/15/2018 0953   GLUCOSE 139 (H) 03/15/2018 0953   BUN 14 03/15/2018 0953   CREATININE 0.81 03/15/2018 0953   CALCIUM 9.6 03/15/2018 0953   PROT 6.8 03/15/2018 0953   ALBUMIN 3.9 03/15/2018 0953   AST 18 03/15/2018 0953   ALT 18 03/15/2018 0953   ALKPHOS 176 (H) 03/15/2018 0953   BILITOT 0.8 03/15/2018 0953   GFRNONAA >60 04/11/2017 0927   GFRAA >60 04/11/2017 0927   Lab Results  Component Value Date   CHOL 139 03/15/2018   HDL 46.30 03/15/2018   LDLCALC 77 03/15/2018   LDLDIRECT 153.2 11/30/2013   TRIG 77.0 03/15/2018   CHOLHDL 3 03/15/2018   Lab Results  Component Value Date   HGBA1C 5.7 03/15/2018   No results found for: [id number] Lab Results  Component Value Date   TSH 1.03 03/15/2018       ASSESSMENT AND PLAN  Pain due to total right knee replacement, subsequent encounter - Plan: DG Knee Complete 4 Views Right  Right leg weakness - Plan: NCV with EMG(electromyography)  Right leg pain - Plan: NCV with EMG(electromyography)   In summary, Mr. Hanssen is a 77 year old man with gait difficulties and right leg pain.  He noted the issues occurred a few months after he had surgery on the right knee.  His physical examination does not show any major nerve deficit at this time though he did have some subtle sensory findings in the feet.  We need to check an NCV/EMG to better evaluate.  Additionally, I am going to have him get an x-ray of the right knee.  To help with his pain I will place him on meloxicam 7.5 mg daily and this can be increased if he tolerates it well.  I will see him back when he returns for the EMG.  He  should call sooner if he has new or worsening neurologic symptoms.  Thank you for asking to see Mr. Canepa.  Please let me know if I can be of further assistance with him or other patients in the future.   Richard A. Felecia Shelling, MD, [facility] 04/19/1020, 11:73 AM Certified in Neurology, Clinical Neurophysiology, Sleep Medicine, Pain Medicine and Neuroimaging  [facility] [address], [place], [place] [address] [phone]

## 2018-07-27 ENCOUNTER — Ambulatory Visit
Admission: RE | Admit: 2018-07-27 | Discharge: 2018-07-27 | Disposition: A | Discharge status: Home / Self Care | Payer: Medicare Other | Source: Ambulatory Visit | Attending: Neurology | Admitting: Neurology

## 2018-07-27 ENCOUNTER — Telehealth: Payer: Self-pay | Admitting: *Deleted

## 2018-07-27 DIAGNOSIS — T8484XD Pain due to internal orthopedic prosthetic devices, implants and grafts, subsequent encounter: Secondary | ICD-10-CM

## 2018-07-27 DIAGNOSIS — Z96651 Presence of right artificial knee joint: Principal | ICD-10-CM

## 2018-07-27 DIAGNOSIS — M25561 Pain in right knee: Secondary | ICD-10-CM | POA: Diagnosis not present

## 2018-07-27 NOTE — Telephone Encounter (Signed)
Spoke with pt's wife [name]) and reviewed below x-ray results.  She verbalized understanding of same/fim

## 2018-07-27 NOTE — Telephone Encounter (Signed)
-----   Message from Britt Bottom, MD sent at 07/27/2018  5:12 PM EDT ----- Please let him know that the x-ray did not show any fractures or problems with the metal replacement

## 2018-08-08 ENCOUNTER — Telehealth: Payer: Self-pay | Admitting: Family Medicine

## 2018-08-08 NOTE — Telephone Encounter (Signed)
Copied from [place] [id number]. Topic: Quick Communication - See Telephone Encounter >> Aug 08, 2018  2:33 PM Rutherford Nail, [place] wrote: CRM for notification. See Telephone encounter for: 08/08/18. Patient's wife calling and is wanting to know why the pharmacy only gave 5 pills of the tamsulosin (FLOMAX) 0.4 MG CAPS capsule? Would like a call back to discuss. CB#: [phone]

## 2018-08-08 NOTE — Telephone Encounter (Signed)
I called and spoke with patient's wife. Advised her that an Rx for 90 capsules with 3 refills was sent in to patient's preferred pharmacy on 02/2018. Patient's wife verbalized understanding.

## 2018-09-05 ENCOUNTER — Encounter: Payer: Self-pay | Admitting: Neurology

## 2018-09-05 ENCOUNTER — Encounter (INDEPENDENT_AMBULATORY_CARE_PROVIDER_SITE_OTHER): Payer: Self-pay

## 2018-09-05 ENCOUNTER — Ambulatory Visit (INDEPENDENT_AMBULATORY_CARE_PROVIDER_SITE_OTHER): Payer: Medicare Other | Admitting: Neurology

## 2018-09-05 ENCOUNTER — Telehealth: Payer: Self-pay | Admitting: Neurology

## 2018-09-05 DIAGNOSIS — G629 Polyneuropathy, unspecified: Secondary | ICD-10-CM

## 2018-09-05 DIAGNOSIS — M5416 Radiculopathy, lumbar region: Secondary | ICD-10-CM

## 2018-09-05 DIAGNOSIS — M79604 Pain in right leg: Secondary | ICD-10-CM

## 2018-09-05 DIAGNOSIS — R29898 Other symptoms and signs involving the musculoskeletal system: Secondary | ICD-10-CM

## 2018-09-05 DIAGNOSIS — Z0289 Encounter for other administrative examinations: Secondary | ICD-10-CM

## 2018-09-05 NOTE — Telephone Encounter (Signed)
Medicare order sent to GI. No auth they will reach out to the pt to schedule.  °

## 2018-09-05 NOTE — Progress Notes (Signed)
Full Name: Juan Kissoon Gender: Male MRN #: [id number] Date of Birth: [birth year]    Visit Date: 09/05/2018 09:26 Age: 77 Years 60 Months Old Examining Physician: Arlice Colt, MD  Referring Physician: Felecia Shelling, MD    History:  Mr. Abdulloh Ullom is a 77 year old man with right-sided leg weakness and pain and numbness in the toes bilaterally, worse on the right.  Nerve conduction studies: Right peroneal motor response was normal.  The left peroneal motor response had a borderline reduced amplitude with mildly prolonged distal latency and mildly reduced conduction velocity.  Bilateral tibial motor responses had reduced amplitude and reduced conduction velocities, right worse than left,  with a borderline distal latency on the right.  Bilateral tibial F-wave responses were mildly prolonged.  Bilateral sural and superficial peroneal sensory responses were mildly reduced in amplitude with slightly prolonged peak latencies.   Electromyography: Needle EMG of selected muscles of the right leg was performed.  L5 and S1 innervated muscles showed mild chronic denervation.  Distal muscles were slightly worse than the proximal lungs.  There was no acute denervation noted.  Impression: This NCV/EMG study shows electrophysiologic evidence of right chronic L5 and S1 radiculopathies without active features.  There could be a minimal superimposed length dependent sensory and motor polyneuropathy.  Inga Noller A. Felecia Shelling, MD, PhD, FAAN Certified in Neurology, Clinical Neurophysiology, Sleep Medicine, Pain Medicine and Neuroimaging Director, [place] [facility] [address] [place], [place] [address] [phone]        [facility]    Nerve / Sites Muscle Latency Ref. Amplitude Ref. Rel Amp Segments Distance Velocity Ref. Area    ms ms mV mV %  cm m/s m/s mVms  R Peroneal - EDB     Ankle EDB 6.0 ?6.5 4.8 ?2.0 100 Ankle - EDB  9   15.8     Fib head EDB 14.5  4.4  92.1 Fib head - Ankle 33 39 ?44 16.3     Pop fossa EDB 17.6  4.2  94.7 Pop fossa - Fib head 12 40 ?44 15.8         Pop fossa - Ankle      L Peroneal - EDB     Ankle EDB 7.0 ?6.5 2.0 ?2.0 100 Ankle - EDB 9   6.5     Fib head EDB 16.5  1.5  74.9 Fib head - Ankle 33 35 ?44 7.0     Pop fossa EDB 19.9  1.4  91.1 Pop fossa - Fib head 12 35 ?44 6.5         Pop fossa - Ankle      R Tibial - AH     Ankle AH 5.8 ?5.8 1.5 ?4.0 100 Ankle - AH 9   4.6     Pop fossa AH 17.4  1.3  83.2 Pop fossa - Ankle 36 31 ?41 3.9  L Tibial - AH     Ankle AH 5.6 ?5.8 2.4 ?4.0 100 Ankle - AH 9   9.2     Pop fossa AH 16.8  1.6  69.5 Pop fossa - Ankle 36 32 ?41 5.6             SNC    Nerve / Sites Rec. Site Peak Lat Ref.  Amp Ref. Segments Distance    ms ms V V  cm  R Sural - Ankle (Calf)     Calf Ankle  4.8 ?4.4 5 ?6 Calf - Ankle 14  L Sural - Ankle (Calf)     Calf Ankle 4.7 ?4.4 4 ?6 Calf - Ankle 14  R Superficial peroneal - Ankle     Lat leg Ankle 4.4 ?4.4 4 ?6 Lat leg - Ankle 14  L Superficial peroneal - Ankle     Lat leg Ankle 4.7 ?4.4 2 ?6 Lat leg - Ankle 14              F  Wave    Nerve F Lat Ref.   ms ms  R Tibial - AH 59.8 ?56.0  L Tibial - AH 60.8 ?56.0         EMG full       EMG Summary Table    Spontaneous MUAP Recruitment  Muscle IA Fib PSW Fasc Other Amp Dur. Poly Pattern  R. Vastus medialis Normal None None None _______ Normal Normal 1+ Normal  R. Tibialis anterior Normal None None None _______ Increased Increased 1+ Reduced  R. Peroneus longus Normal None None None _______ Normal Increased 1+ Reduced  R. Gastrocnemius (Medial head) Normal None None None _______ Normal Normal 1+ Reduced  R. Dorsal interossei (pedis) Normal None None None _______ Normal Normal 1+ Reduced  R. Gluteus medius Normal None None None _______ Normal Normal 1+ Reduced  R. Iliopsoas Normal None None None _______ Normal Normal Normal Normal

## 2018-09-07 ENCOUNTER — Telehealth: Payer: Self-pay | Admitting: *Deleted

## 2018-09-07 LAB — MULTIPLE MYELOMA PANEL, SERUM
ALPHA 1: 0.2 g/dL (ref 0.0–0.4)
ALPHA2 GLOB SERPL ELPH-MCNC: 0.6 g/dL (ref 0.4–1.0)
Albumin SerPl Elph-Mcnc: 4.1 g/dL (ref 2.9–4.4)
Albumin/Glob SerPl: 1.3 (ref 0.7–1.7)
B-GLOBULIN SERPL ELPH-MCNC: 1.1 g/dL (ref 0.7–1.3)
GLOBULIN, TOTAL: 3.3 g/dL (ref 2.2–3.9)
Gamma Glob SerPl Elph-Mcnc: 1.4 g/dL (ref 0.4–1.8)
IGG (IMMUNOGLOBIN G), SERUM: 1472 mg/dL (ref 700–1600)
IgA/Immunoglobulin A, Serum: 360 mg/dL (ref 61–437)
IgM (Immunoglobulin M), Srm: 49 mg/dL (ref 15–143)
Total Protein: 7.4 g/dL (ref 6.0–8.5)

## 2018-09-07 NOTE — Telephone Encounter (Signed)
Spoke with wife [name] (on hippa) and explained lab work done in our office was ok.  She verbalized understanding of same/fim

## 2018-09-07 NOTE — Telephone Encounter (Signed)
-----   Message from Britt Bottom, MD sent at 09/07/2018  4:38 PM EDT ----- Please let him know the lab work was fine.

## 2018-09-20 ENCOUNTER — Ambulatory Visit
Admission: RE | Admit: 2018-09-20 | Discharge: 2018-09-20 | Disposition: A | Discharge status: Home / Self Care | Payer: Medicare Other | Source: Ambulatory Visit | Attending: Neurology | Admitting: Neurology

## 2018-09-20 DIAGNOSIS — M5416 Radiculopathy, lumbar region: Secondary | ICD-10-CM

## 2018-09-21 ENCOUNTER — Telehealth: Payer: Self-pay | Admitting: Neurology

## 2018-09-21 DIAGNOSIS — R29898 Other symptoms and signs involving the musculoskeletal system: Secondary | ICD-10-CM

## 2018-09-21 DIAGNOSIS — M5416 Radiculopathy, lumbar region: Secondary | ICD-10-CM

## 2018-09-21 DIAGNOSIS — M48061 Spinal stenosis, lumbar region without neurogenic claudication: Secondary | ICD-10-CM

## 2018-09-21 NOTE — Telephone Encounter (Signed)
I called [name] but got voicemail.  A left message that I had called about the MRI.  The MRI does show moderate spinal stenosis from L2-L3 all the way down to L5-S1 and that is likely causing his symptoms.    I would like him to see neurosurgery for the spinal stenosis, pain and difficulty walking.   I will try to reach him again on Monday.

## 2018-09-25 DIAGNOSIS — M48061 Spinal stenosis, lumbar region without neurogenic claudication: Secondary | ICD-10-CM | POA: Insufficient documentation

## 2018-09-25 NOTE — Telephone Encounter (Signed)
I spoke with Tanner Griffin about the MRI.     I would like him to see neurosurgery for the spinal stenosis, pain and difficulty walking.

## 2018-10-11 DIAGNOSIS — G8929 Other chronic pain: Secondary | ICD-10-CM | POA: Diagnosis not present

## 2018-10-11 DIAGNOSIS — Z6826 Body mass index (BMI) 26.0-26.9, adult: Secondary | ICD-10-CM | POA: Diagnosis not present

## 2018-10-11 DIAGNOSIS — M545 Low back pain: Secondary | ICD-10-CM | POA: Diagnosis not present

## 2018-10-24 ENCOUNTER — Encounter: Payer: Self-pay | Admitting: Family Medicine

## 2018-10-24 ENCOUNTER — Ambulatory Visit (INDEPENDENT_AMBULATORY_CARE_PROVIDER_SITE_OTHER): Payer: Medicare Other | Admitting: Behavioral Health

## 2018-10-24 DIAGNOSIS — Z23 Encounter for immunization: Secondary | ICD-10-CM | POA: Diagnosis not present

## 2018-10-24 NOTE — Progress Notes (Signed)
Patient came in clinic today for influenza vaccination. IM injection was given in the left deltoid. Patient tolerated the injection well. No signs or symptoms of a reaction were noted prior to patient leaving the nurse visit. He declined VIS info sheet on influenza.

## 2018-11-03 ENCOUNTER — Other Ambulatory Visit: Payer: Self-pay

## 2018-11-14 ENCOUNTER — Ambulatory Visit (INDEPENDENT_AMBULATORY_CARE_PROVIDER_SITE_OTHER): Payer: Medicare Other | Admitting: Family Medicine

## 2018-11-14 ENCOUNTER — Encounter: Payer: Self-pay | Admitting: Family Medicine

## 2018-11-14 VITALS — BP 132/80 | HR 91 | Ht 68.0 in | Wt 175.1 lb

## 2018-11-14 DIAGNOSIS — R350 Frequency of micturition: Secondary | ICD-10-CM

## 2018-11-14 LAB — POCT URINALYSIS DIPSTICK
BILIRUBIN UA: NEGATIVE
Glucose, UA: NEGATIVE
KETONES UA: NEGATIVE
Leukocytes, UA: NEGATIVE
Nitrite, UA: NEGATIVE
PH UA: 6 (ref 5.0–8.0)
Protein, UA: POSITIVE — AB
RBC UA: NEGATIVE
Spec Grav, UA: 1.025 (ref 1.010–1.025)
UROBILINOGEN UA: 1 U/dL

## 2018-11-14 LAB — PSA: PSA: 5.96 ng/mL — ABNORMAL HIGH (ref 0.10–4.00)

## 2018-11-14 MED ORDER — TAMSULOSIN HCL 0.4 MG PO CAPS: 0.8000 mg | ORAL_CAPSULE | Freq: Every day | ORAL | 2 refills | Status: DC

## 2018-11-14 NOTE — Progress Notes (Signed)
Established Patient Office Visit  Subjective:  Patient ID: Tanner Griffin, male    DOB: 02-28-1941  Age: 77 y.o. MRN: [id number]  CC:  Chief Complaint  Patient presents with  . Urinary Frequency    HPI Tanner Griffin presents for evaluation for urinary frequency. This has been an ongoing problem for 4-5 months. Patient feels like he is constantly getting up to go to the bathroom. He has stopped drinking sodas to see if that helped, but he is still having the same problem.  Patient reports ongoing urinary hesitancy with post void dribbling.  He is experiencing nocturia x4.  He does have issue with what sounds to be urge incontinence.  He has difficulty making it to the bathroom without leaking urine when he feels the urge to urinate.  Flomax seems to help some but it is expensive.  Past Medical History:  Diagnosis Date  . Arthritis   . Arthrofibrosis of total knee arthroplasty, right 03/26/2014  . Constipation    takes Miralax daily as needed  . Headache    occasional  . HYPERLIPIDEMIA    takes Lipitor daily  . HYPERTENSION    takes Amlodipine and Labetalol daily  . Joint pain   . Joint swelling   . Nocturia   . Osteoarthritis of left knee 03/26/2014  . Pneumonia    hx of-as a child  . Urinary frequency    takes Flomax daily    Past Surgical History:  Procedure Laterality Date  . COLONOSCOPY    . HERNIA REPAIR     inguinal 04/14/17  . INGUINAL HERNIA REPAIR Right 04/14/2017   Procedure: RIGHT INGUINAL HERNIA REPAIR WITH MESH;  Surgeon: Armandina Gemma, MD;  Location: [facility];  Service: General;  Laterality: Right;  . INSERTION OF MESH Right 04/14/2017   Procedure: INSERTION OF MESH;  Surgeon: Armandina Gemma, MD;  Location: [facility];  Service: General;  Laterality: Right;  . KNEE CLOSED REDUCTION Right 03/26/2014   Procedure: CLOSED MANIPULATION KNEE;  Surgeon: [name], MD;  Location: [place];  Service: Orthopedics;  Laterality: Right;  . TOTAL KNEE ARTHROPLASTY  2011   right,  Dr. [name] per pt  . TOTAL KNEE ARTHROPLASTY Left 03/26/2014   DR LANDAU  . TOTAL KNEE ARTHROPLASTY Left 03/26/2014   Procedure: TOTAL KNEE ARTHROPLASTY;  Surgeon: [name], MD;  Location: [place];  Service: Orthopedics;  Laterality: Left;    Family History  Problem Relation Age of Onset  . Brain cancer Father   . Renal Disease Brother        ESRD, unknown cause  . Colon cancer Neg Hx   . Stomach cancer Neg Hx   . Esophageal cancer Neg Hx   . Rectal cancer Neg Hx     Social History   Socioeconomic History  . Marital status: Married    Spouse name: Mary  . Number of children: 5  . Years of education: 9th  . Highest education level: Not on file  Occupational History  . Occupation: retired    Comment: Barista  . Financial resource strain: Not on file  . Food insecurity:    Worry: Not on file    Inability: Not on file  . Transportation needs:    Medical: Not on file    Non-medical: Not on file  Tobacco Use  . Smoking status: Never Smoker  . Smokeless tobacco: Never Used  Substance and Sexual Activity  . Alcohol use: No  Alcohol/week: 0.0 standard drinks  . Drug use: No  . Sexual activity: Yes  Lifestyle  . Physical activity:    Days per week: Not on file    Minutes per session: Not on file  . Stress: Not on file  Relationships  . Social connections:    Talks on phone: Not on file    Gets together: Not on file    Attends religious service: Not on file    Active member of club or organization: Not on file    Attends meetings of clubs or organizations: Not on file    Relationship status: Not on file  . Intimate partner violence:    Fear of current or ex partner: Not on file    Emotionally abused: Not on file    Physically abused: Not on file    Forced sexual activity: Not on file  Other Topics Concern  . Not on file  Social History Narrative   Married, together 45 years in 2016. 1 son, 2 daughters. Can't count # grandkids at least  20, 18. 1 greatgranddaughter      Grandson and daughter live with them      Retired from Architect work      Hobbies: time with grand kids-play basketball    Outpatient Medications Prior to Visit  Medication Sig Dispense Refill  . amLODipine (NORVASC) 10 MG tablet Take 1 tablet (10 mg total) by mouth daily. 90 tablet 1  . aspirin EC 81 MG tablet Take 81 mg by mouth daily.    [name] atorvastatin (LIPITOR) 40 MG tablet Take 1 tablet (40 mg total) by mouth daily. 90 tablet 3  . meloxicam (MOBIC) 7.5 MG tablet Take 1 tablet (7.5 mg total) by mouth daily. 30 tablet 5  . polyethylene glycol powder (GLYCOLAX/MIRALAX) powder Take 17 g by mouth 2 (two) times daily as needed. 3350 g 1  . tamsulosin (FLOMAX) 0.4 MG CAPS capsule Take 1 capsule (0.4 mg total) by mouth daily. 90 capsule 3   No facility-administered medications prior to visit.     Allergies  Allergen Reactions  . Telmisartan Other (See Comments)    Reaction:  Headache     ROS Review of Systems  Constitutional: Negative.   Genitourinary: Positive for difficulty urinating, frequency and urgency. Negative for decreased urine volume, discharge, flank pain and hematuria.  Hematological: Does not bruise/bleed easily.  Psychiatric/Behavioral: Negative.       Objective:    Physical Exam  Constitutional: He is oriented to person, place, and time. He appears well-developed and well-nourished. No distress.  HENT:  Head: Normocephalic and atraumatic.  Right Ear: External ear normal.  Left Ear: External ear normal.  Eyes: Right eye exhibits no discharge. Left eye exhibits no discharge. No scleral icterus.  Pulmonary/Chest: Effort normal.  Neurological: He is alert and oriented to person, place, and time.  Skin: Skin is warm and dry. He is not diaphoretic.  Psychiatric: He has a normal mood and affect. His behavior is normal.    BP 132/80   Pulse 91   Ht 5\' 8"  (1.727 m)   Wt 175 lb 2 oz (79.4 kg)   SpO2 99%   BMI 26.63 kg/m   Wt Readings from Last 3 Encounters:  11/14/18 175 lb 2 oz (79.4 kg)  07/26/18 170 lb 8 oz (77.3 kg)  03/29/18 176 lb (79.8 kg)   BP Readings from Last 3 Encounters:  11/14/18 132/80  07/26/18 128/80  03/29/18 122/66   Health Maintenance Due  Topic Date Due  . FOOT EXAM  10/16/2016  . OPHTHALMOLOGY EXAM  11/18/2016  . HEMOGLOBIN A1C  09/15/2018    There are no preventive care reminders to display for this patient.  Lab Results  Component Value Date   TSH 1.03 03/15/2018   Lab Results  Component Value Date   WBC 4.5 03/15/2018   HGB 13.6 03/15/2018   HCT 40.7 03/15/2018   MCV 98.0 03/15/2018   PLT 236.0 03/15/2018   Lab Results  Component Value Date   NA 142 03/15/2018   K 4.6 03/15/2018   CO2 31 03/15/2018   GLUCOSE 139 (H) 03/15/2018   BUN 14 03/15/2018   CREATININE 0.81 03/15/2018   BILITOT 0.8 03/15/2018   ALKPHOS 176 (H) 03/15/2018   AST 18 03/15/2018   ALT 18 03/15/2018   PROT 7.4 09/05/2018   ALBUMIN 3.9 03/15/2018   CALCIUM 9.6 03/15/2018   ANIONGAP 5 04/11/2017   GFR 119.49 03/15/2018   Lab Results  Component Value Date   CHOL 139 03/15/2018   Lab Results  Component Value Date   HDL 46.30 03/15/2018   Lab Results  Component Value Date   LDLCALC 77 03/15/2018   Lab Results  Component Value Date   TRIG 77.0 03/15/2018   Lab Results  Component Value Date   CHOLHDL 3 03/15/2018   Lab Results  Component Value Date   HGBA1C 5.7 03/15/2018      Assessment & Plan:   Problem List Items Addressed This Visit    None    Visit Diagnoses    Urinary frequency    -  Primary   Relevant Medications   tamsulosin (FLOMAX) 0.4 MG CAPS capsule   Other Relevant Orders   POCT urinalysis dipstick (Completed)   Ambulatory referral to Urology   PSA      Meds ordered this encounter  Medications  . tamsulosin (FLOMAX) 0.4 MG CAPS capsule    Sig: Take 2 capsules (0.8 mg total) by mouth daily after supper.    Dispense:  60 capsule    Refill:   2   We will recheck PSA today.  Have increased Flomax to 0.8 mg daily.  If the patient's wife a good Rx card.  He will avoid fluids a couple hours before going to bed at night.  This patient may be requiring a more definitive procedure. Follow-up: Return in about 3 months (around 02/14/2019).

## 2018-11-14 NOTE — Patient Instructions (Signed)
Benign Prostatic Hyperplasia  Benign prostatic hyperplasia (BPH) is an enlarged prostate gland that is caused by the normal aging process and not by cancer. The prostate is a walnut-sized gland that is involved in the production of semen. It is located in front of the rectum and below the bladder. The bladder stores urine and the urethra is the tube that carries the urine out of the body. The prostate may get bigger as a man gets older.  An enlarged prostate can press on the urethra. This can make it harder to pass urine. The build-up of urine in the bladder can cause infection. Back pressure and infection may progress to bladder damage and kidney (renal) failure.  What are the causes?  This condition is part of a normal aging process. However, not all men develop problems from this condition. If the prostate enlarges away from the urethra, urine flow will not be blocked. If it enlarges toward the urethra and compresses it, there will be problems passing urine.  What increases the risk?  This condition is more likely to develop in men over the age of 50 years.  What are the signs or symptoms?  Symptoms of this condition include:  · Getting up often during the night to urinate.  · Needing to urinate frequently during the day.  · Difficulty starting urine flow.  · Decrease in size and strength of your urine stream.  · Leaking (dribbling) after urinating.  · Inability to pass urine. This needs immediate treatment.  · Inability to completely empty your bladder.  · Pain when you pass urine. This is more common if there is also an infection.  · Urinary tract infection (UTI).    How is this diagnosed?  This condition is diagnosed based on your medical history, a physical exam, and your symptoms. Tests will also be done, such as:  · A post-void bladder scan. This measures any amount of urine that may remain in your bladder after you finish urinating.  · A digital rectal exam. In a rectal exam, your health care provider  checks your prostate by putting a lubricated, gloved finger into your rectum to feel the back of your prostate gland. This exam detects the size of your gland and any abnormal lumps or growths.  · An exam of your urine (urinalysis).  · A prostate specific antigen (PSA) screening. This is a blood test used to screen for prostate cancer.  · An ultrasound. This test uses sound waves to electronically produce a picture of your prostate gland.    Your health care provider may refer you to a specialist in kidney and prostate diseases (urologist).  How is this treated?  Once symptoms begin, your health care provider will monitor your condition (active surveillance or watchful waiting). Treatment for this condition will depend on the severity of your condition. Treatment may include:  · Observation and yearly exams. This may be the only treatment needed if your condition and symptoms are mild.  · Medicines to relieve your symptoms, including:  ? Medicines to shrink the prostate.  ? Medicines to relax the muscle of the prostate.  · Surgery in severe cases. Surgery may include:  ? Prostatectomy. In this procedure, the prostate tissue is removed completely through an open incision or with a laparascope or robotics.  ? Transurethral resection of the prostate (TURP). In this procedure, a tool is inserted through the opening at the tip of the penis (urethra). It is used to cut away tissue of   the inner core of the prostate. The pieces are removed through the same opening of the penis. This removes the blockage.  ? Transurethral incision (TUIP). In this procedure, small cuts are made in the prostate. This lessens the prostate's pressure on the urethra.  ? Transurethral microwave thermotherapy (TUMT). This procedure uses microwaves to create heat. The heat destroys and removes a small amount of prostate tissue.  ? Transurethral needle ablation (TUNA). This procedure uses radio frequencies to destroy and remove a small amount of  prostate tissue.  ? Interstitial laser coagulation (ILC). This procedure uses a laser to destroy and remove a small amount of prostate tissue.  ? Transurethral electrovaporization (TUVP). This procedure uses electrodes to destroy and remove a small amount of prostate tissue.  ? Prostatic urethral lift. This procedure inserts an implant to push the lobes of the prostate away from the urethra.    Follow these instructions at home:  · Take over-the-counter and prescription medicines only as told by your health care provider.  · Monitor your symptoms for any changes. Contact your health care provider with any changes.  · Avoid drinking large amounts of liquid before going to bed or out in public.  · Avoid or reduce how much caffeine or alcohol you drink.  · Give yourself time when you urinate.  · Keep all follow-up visits as told by your health care provider. This is important.  Contact a health care provider if:  · You have unexplained back pain.  · Your symptoms do not get better with treatment.  · You develop side effects from the medicine you are taking.  · Your urine becomes very dark or has a bad smell.  · Your lower abdomen becomes distended and you have trouble passing your urine.  Get help right away if:  · You have a fever or chills.  · You suddenly cannot urinate.  · You feel lightheaded, or very dizzy, or you faint.  · There are large amounts of blood or clots in the urine.  · Your urinary problems become hard to manage.  · You develop moderate to severe low back or flank pain. The flank is the side of your body between the ribs and the hip.  These symptoms may represent a serious problem that is an emergency. Do not wait to see if the symptoms will go away. Get medical help right away. Call your local emergency services (911 in the [place] Do not drive yourself to the hospital.  Summary  · Benign prostatic hyperplasia (BPH) is an enlarged prostate that is caused by the normal aging process and not by  cancer.  · An enlarged prostate can press on the urethra. This can make it hard to pass urine.  · This condition is part of a normal aging process and is more likely to develop in men over the age of 50 years.  · Get help right away if you suddenly cannot urinate.  This information is not intended to replace advice given to you by your health care provider. Make sure you discuss any questions you have with your health care provider.  Document Released: 12/06/2005 Document Revised: 01/10/2017 Document Reviewed: 01/10/2017  Elsevier Interactive Patient Education © 2018 [employer] Inc[employer]

## 2018-12-25 ENCOUNTER — Other Ambulatory Visit: Payer: Self-pay

## 2018-12-25 DIAGNOSIS — R350 Frequency of micturition: Secondary | ICD-10-CM

## 2018-12-25 DIAGNOSIS — E1159 Type 2 diabetes mellitus with other circulatory complications: Secondary | ICD-10-CM

## 2018-12-25 DIAGNOSIS — I10 Essential (primary) hypertension: Principal | ICD-10-CM

## 2018-12-25 DIAGNOSIS — E785 Hyperlipidemia, unspecified: Secondary | ICD-10-CM

## 2018-12-25 MED ORDER — ATORVASTATIN CALCIUM 40 MG PO TABS: 40.0000 mg | ORAL_TABLET | Freq: Every day | ORAL | 1 refills | Status: DC

## 2018-12-25 MED ORDER — AMLODIPINE BESYLATE 10 MG PO TABS: 10.0000 mg | ORAL_TABLET | Freq: Every day | ORAL | 1 refills | Status: DC

## 2018-12-25 MED ORDER — TAMSULOSIN HCL 0.4 MG PO CAPS: 0.8000 mg | ORAL_CAPSULE | Freq: Every day | ORAL | 1 refills | Status: DC

## 2019-02-02 ENCOUNTER — Encounter: Payer: Self-pay | Admitting: *Deleted

## 2019-02-12 DIAGNOSIS — C61 Malignant neoplasm of prostate: Secondary | ICD-10-CM | POA: Insufficient documentation

## 2019-02-12 NOTE — Progress Notes (Addendum)
GU Location of Tumor / Histology: prostatic adenocarcinoma  If Prostate Cancer, Gleason Score is (3 + 4) and PSA is (5.96). Prostate volume: 37 cc.   Tanner Griffin was referred by Dr. Abelino Derrick to Dr. Karsten Ro for further evaluation of an elevated PSA. Daughter reports that Dr. Ethelene Hal first told her father his PSA was elevated in January 2020 then it took approximately 3 weeks to obtain an appointment with Dr. Karsten Ro.   Biopsies of prostate (if applicable) revealed:    Past/Anticipated interventions by urology, if any: prostate biopsy, trial of Myrbetriq, prescription for Flomax (patient states, "it doesn't work at all"), referral for consideration of radiotherapy  Past/Anticipated interventions by medical oncology, if any: no  Weight changes, if any: no  Bowel/Bladder complaints, if any: Reports urinary frequency, urgency, and nocturia x 4. SHIM 1. IPSS 15. Reports urinary leakage.   Nausea/Vomiting, if any: no  Pain issues, if any:  Chronic pain related to bilateral knee replacement  SAFETY ISSUES:  Prior radiation? no  Pacemaker/ICD? no  Possible current pregnancy? no, male patient  Is the patient on methotrexate? no  Current Complaints / other details:  78 year old male. NKDA. Married. Interested most in radioactive seed implant. Accompanied today by his daughter. Reports he lives on [name]. Denies any knowledge of cancer in his family. Reports his mother passed in her 53s and her fathers in his 15s.

## 2019-02-13 ENCOUNTER — Ambulatory Visit
Admission: RE | Admit: 2019-02-13 | Discharge: 2019-02-13 | Disposition: A | Discharge status: Home / Self Care | Payer: Medicare Other | Source: Ambulatory Visit | Attending: Radiation Oncology | Admitting: Radiation Oncology

## 2019-02-13 ENCOUNTER — Other Ambulatory Visit: Payer: Self-pay

## 2019-02-13 ENCOUNTER — Encounter: Payer: Self-pay | Admitting: Radiation Oncology

## 2019-02-13 ENCOUNTER — Encounter: Payer: Self-pay | Admitting: Medical Oncology

## 2019-02-13 DIAGNOSIS — K5900 Constipation, unspecified: Secondary | ICD-10-CM | POA: Insufficient documentation

## 2019-02-13 DIAGNOSIS — C61 Malignant neoplasm of prostate: Secondary | ICD-10-CM

## 2019-02-13 DIAGNOSIS — I10 Essential (primary) hypertension: Secondary | ICD-10-CM | POA: Insufficient documentation

## 2019-02-13 DIAGNOSIS — M24661 Ankylosis, right knee: Secondary | ICD-10-CM | POA: Insufficient documentation

## 2019-02-13 DIAGNOSIS — Z79899 Other long term (current) drug therapy: Secondary | ICD-10-CM | POA: Insufficient documentation

## 2019-02-13 DIAGNOSIS — E785 Hyperlipidemia, unspecified: Secondary | ICD-10-CM | POA: Diagnosis not present

## 2019-02-13 DIAGNOSIS — Z7982 Long term (current) use of aspirin: Secondary | ICD-10-CM | POA: Insufficient documentation

## 2019-02-13 DIAGNOSIS — M1712 Unilateral primary osteoarthritis, left knee: Secondary | ICD-10-CM | POA: Diagnosis not present

## 2019-02-13 DIAGNOSIS — Z808 Family history of malignant neoplasm of other organs or systems: Secondary | ICD-10-CM | POA: Diagnosis not present

## 2019-02-13 HISTORY — DX: Malignant neoplasm of prostate: C61

## 2019-02-13 NOTE — Progress Notes (Signed)
See progress note under physician encounter. 

## 2019-02-13 NOTE — Progress Notes (Signed)
Introduced myself to Mr. Rokosz and his daughter as the prostate nurse navigator. He is not interested in having to come multiple days for treatment so he is interested in brachytherapy. We discussed the planning, procedure and post op recovery. I gave his daughter my business card and asked them to call with questions or concerns. She voiced understanding.

## 2019-02-13 NOTE — Progress Notes (Signed)
Radiation Oncology         (336) [phone] ________________________________  Initial Outpatient Consultation  Name: Tanner Griffin MRN: [id number]  Date: 02/13/2019  DOB: [birth year]  UQ:JFHLKT, Mortimer Fries, MD  [name], MD   REFERRING PHYSICIAN: [name], MD  DIAGNOSIS: 78 y.o. gentleman with Stage T1c adenocarcinoma of the prostate with Gleason score of 3+4, and PSA of 5.96.    ICD-10-CM   1. Malignant neoplasm of prostate ([place]) C61     HISTORY OF PRESENT ILLNESS: Tanner Griffin is a 78 y.o. male with a diagnosis of prostate cancer. He was noted to have an elevated PSA of 5.96 in November 2019 by his primary care physician, Dr. Abelino Derrick.  A previous PSA was 4.05 on 02/27/2018.  Accordingly, he was referred for evaluation in urology to Dr. Karsten Ro on 01/02/2019, and a digital rectal examination was performed at that time revealing no prostate nodules.  The patient proceeded to transrectal ultrasound with 12 biopsies of the prostate on 01/23/2019.  The prostate volume measured 37.28 cc.  Out of 12 core biopsies, 4 were positive.  The maximum Gleason score was 3+4, and this was seen in the left mid lateral and left apex lateral.  Additionally, there was Gleason 3+3 disease seen in the left base lateral and right apex lateral.  Biopsies of prostate revealed:    The patient reviewed the biopsy results with his urologist and he has kindly been referred today for discussion of potential radiation treatment options.   PREVIOUS RADIATION THERAPY: No  PAST MEDICAL HISTORY:  Past Medical History:  Diagnosis Date  . Arthritis   . Arthrofibrosis of total knee arthroplasty, right 03/26/2014  . Constipation    takes Miralax daily as needed  . Headache    occasional  . HYPERLIPIDEMIA    takes Lipitor daily  . HYPERTENSION    takes Amlodipine and Labetalol daily  . Joint pain   . Joint swelling   . Nocturia   . Osteoarthritis of left knee 03/26/2014  . Pneumonia    hx of-as  a child  . Prostate cancer ([place])   . Urinary frequency    takes Flomax daily      PAST SURGICAL HISTORY: Past Surgical History:  Procedure Laterality Date  . COLONOSCOPY    . HERNIA REPAIR     inguinal 04/14/17  . INGUINAL HERNIA REPAIR Right 04/14/2017   Procedure: RIGHT INGUINAL HERNIA REPAIR WITH MESH;  Surgeon: Armandina Gemma, MD;  Location: [facility];  Service: General;  Laterality: Right;  . INSERTION OF MESH Right 04/14/2017   Procedure: INSERTION OF MESH;  Surgeon: Armandina Gemma, MD;  Location: [facility];  Service: General;  Laterality: Right;  . KNEE CLOSED REDUCTION Right 03/26/2014   Procedure: CLOSED MANIPULATION KNEE;  Surgeon: [name], MD;  Location: [facility];  Service: Orthopedics;  Laterality: Right;  . TOTAL KNEE ARTHROPLASTY  2011   right, Dr. [name] per pt  . TOTAL KNEE ARTHROPLASTY Left 03/26/2014   DR LANDAU  . TOTAL KNEE ARTHROPLASTY Left 03/26/2014   Procedure: TOTAL KNEE ARTHROPLASTY;  Surgeon: [name], MD;  Location: [facility];  Service: Orthopedics;  Laterality: Left;    FAMILY HISTORY:  Family History  Problem Relation Age of Onset  . Brain cancer Father   . Renal Disease Brother        ESRD, unknown cause  . Stomach cancer Neg Hx   . Esophageal cancer Neg Hx   . Rectal cancer Neg  Hx   . Breast cancer Neg Hx   . Colon cancer Neg Hx   . Pancreatic cancer Neg Hx     SOCIAL HISTORY:  Social History   Socioeconomic History  . Marital status: Married    Spouse name: Mary  . Number of children: 5  . Years of education: 9th  . Highest education level: Not on file  Occupational History  . Occupation: retired    Comment: Barista  . Financial resource strain: Not on file  . Food insecurity:    Worry: Not on file    Inability: Not on file  . Transportation needs:    Medical: Not on file    Non-medical: Not on file  Tobacco Use  . Smoking status: Never Smoker  . Smokeless tobacco: Never Used  Substance and Sexual Activity  .  Alcohol use: No    Alcohol/week: 0.0 standard drinks  . Drug use: No  . Sexual activity: Not Currently  Lifestyle  . Physical activity:    Days per week: Not on file    Minutes per session: Not on file  . Stress: Not on file  Relationships  . Social connections:    Talks on phone: Not on file    Gets together: Not on file    Attends religious service: Not on file    Active member of club or organization: Not on file    Attends meetings of clubs or organizations: Not on file    Relationship status: Not on file  . Intimate partner violence:    Fear of current or ex partner: Not on file    Emotionally abused: Not on file    Physically abused: Not on file    Forced sexual activity: Not on file  Other Topics Concern  . Not on file  Social History Narrative   Married, together 10 years in 2016. 1 son, 2 daughters. Can't count # grandkids at least 10, 11. 1 great-granddaughter.   Resides in [place], on [address].   Grandson and daughter live with them.   Retired from Architect work.   Hobbies: time with grand kids-play basketball.  Accompanied today by daughter.  ALLERGIES: Telmisartan  MEDICATIONS:  Current Outpatient Medications  Medication Sig Dispense Refill  . amLODipine (NORVASC) 10 MG tablet Take 1 tablet (10 mg total) by mouth daily. 90 tablet 1  . atorvastatin (LIPITOR) 40 MG tablet Take 1 tablet (40 mg total) by mouth daily. 90 tablet 1  . tamsulosin (FLOMAX) 0.4 MG CAPS capsule Take 2 capsules (0.8 mg total) by mouth daily after supper. 180 capsule 1  . aspirin EC 81 MG tablet Take 81 mg by mouth daily.    . meloxicam (MOBIC) 7.5 MG tablet Take 1 tablet (7.5 mg total) by mouth daily. (Patient not taking: Reported on 02/13/2019) 30 tablet 5  . polyethylene glycol powder (GLYCOLAX/MIRALAX) powder Take 17 g by mouth 2 (two) times daily as needed. (Patient not taking: Reported on 02/13/2019) 3350 g 1   No current facility-administered medications for this  encounter.     REVIEW OF SYSTEMS:  On review of systems, the patient reports that he is doing well overall. He denies any chest pain, shortness of breath, cough, fevers, chills, night sweats, or unintended weight changes. He denies abdominal pain, nausea or vomiting. He reports baseline constipation. He reports chronic pain related to bilateral knee replacement, but denies any new musculoskeletal or joint aches or pains. He is ambulatory with a  cane. His IPSS was 15, indicating moderate urinary symptoms with frequency, urgency, leakage (UI), and nocturia x4 despite taking Flomax daily. He recently tried a trial of Myrbetriq 25 mg but states this did not help at all.  He denies incomplete bladder emptying, weak stream or straining to void.  His SHIM was 1, indicating he does have severe erectile dysfunction. A complete review of systems is obtained and is otherwise negative.   PHYSICAL EXAM:  Wt Readings from Last 3 Encounters:  02/13/19 169 lb 3.2 oz (76.7 kg)  11/14/18 175 lb 2 oz (79.4 kg)  07/26/18 170 lb 8 oz (77.3 kg)   Temp Readings from Last 3 Encounters:  02/13/19 98.4 F (36.9 C) (Oral)  03/29/18 98.4 F (36.9 C) (Oral)  11/15/17 98 F (36.7 C) (Oral)   BP Readings from Last 3 Encounters:  02/13/19 (!) 134/94  11/14/18 132/80  07/26/18 128/80   Pulse Readings from Last 3 Encounters:  02/13/19 67  11/14/18 91  07/26/18 61   Pain Assessment Pain Score: 0-No pain/10  In general this is a well appearing African-American male in no acute distress. He is alert and oriented x4 and appropriate throughout the examination. HEENT reveals that the patient is normocephalic, atraumatic. EOMs are intact. PERRLA. Skin is intact without any evidence of gross lesions. Cardiovascular exam reveals a regular rate and rhythm, no clicks rubs or murmurs are auscultated. Chest is clear to auscultation bilaterally. Lymphatic assessment is performed and does not reveal any adenopathy in the cervical,  supraclavicular, axillary, or inguinal chains. Abdomen has active bowel sounds in all quadrants and is intact. The abdomen is soft, non tender, non distended. Lower extremities are negative for pretibial pitting edema, deep calf tenderness, cyanosis or clubbing.   KPS = 90  100 - Normal; no complaints; no evidence of disease. 90   - Able to carry on normal activity; minor signs or symptoms of disease. 80   - Normal activity with effort; some signs or symptoms of disease. 23   - Cares for self; unable to carry on normal activity or to do active work. 60   - Requires occasional assistance, but is able to care for most of his personal needs. 50   - Requires considerable assistance and frequent medical care. 29   - Disabled; requires special care and assistance. 58   - Severely disabled; hospital admission is indicated although death not imminent. 55   - Very sick; hospital admission necessary; active supportive treatment necessary. 10   - Moribund; fatal processes progressing rapidly. 0     - Dead  Karnofsky DA, Abelmann Virgil, Craver LS and Burchenal [facility] [phone]) The use of the nitrogen mustards in the palliative treatment of carcinoma: with particular reference to bronchogenic carcinoma Cancer 1 634-56  LABORATORY DATA:  Lab Results  Component Value Date   WBC 4.5 03/15/2018   HGB 13.6 03/15/2018   HCT 40.7 03/15/2018   MCV 98.0 03/15/2018   PLT 236.0 03/15/2018   Lab Results  Component Value Date   NA 142 03/15/2018   K 4.6 03/15/2018   CL 107 03/15/2018   CO2 31 03/15/2018   Lab Results  Component Value Date   ALT 18 03/15/2018   AST 18 03/15/2018   ALKPHOS 176 (H) 03/15/2018   BILITOT 0.8 03/15/2018     RADIOGRAPHY: No results found.    IMPRESSION/PLAN: 1. 78 y.o. gentleman with Stage T1c adenocarcinoma of the prostate with Gleason Score of 3+4, and PSA of 5.96.  We discussed the patient's workup and outlined the nature of prostate cancer in this setting. The patient's T  stage, Gleason's score, and PSA put him into the favorable-intermediate risk group. Accordingly, he is eligible for a variety of potential treatment options including brachytherapy or 5.5 weeks of external radiation. We discussed the available radiation techniques, and focused on the details and logistics and delivery. We discussed and outlined the risks, benefits, short and long-term effects associated with radiotherapy and compared and contrasted these with prostatectomy.  He does have moderate LUTS which appear to be irritative and not obstructive, therefore, we believe he will be a good candidate for brachytherapy.  We discussed the role of SpaceOAR in reducing the rectal toxicity associated with radiotherapy.   At the end of the conversation the patient is interested in moving forward with brachytherapy and use of SpaceOAR to reduce rectal toxicity from radiotherapy.  We will share our discussion with Dr. Karsten Ro and move forward with scheduling his CT [facility] planning appointment in the near future.  The patient met briefly with Romie Jumper in our office who will be working closely with him to coordinate OR scheduling and pre and post procedure appointments.  We will contact the pharmaceutical rep to ensure that [place] is available at the time of procedure.  He will have a prostate MRI following his post-seed CT SIM to confirm appropriate distribution of the [place].  We spent 60 minutes face to face with the patient and more than 50% of that time was spent in counseling and/or coordination of care.    Nicholos Johns, PA-C    Tyler Pita, MD  [facility] Direct Dial: [phone]  Fax: [phone] [email or web address]  Skype  [email or web address]  This document serves as a record of services personally performed by Tyler Pita, MD and Freeman Caldron, PA-C. It was created on their behalf by Rae Lips, a trained medical scribe. The creation of this record is based on the  scribe's personal observations and the providers' statements to them. This document has been checked and approved by the attending providers.

## 2019-02-16 ENCOUNTER — Telehealth: Payer: Self-pay | Admitting: *Deleted

## 2019-02-16 NOTE — Telephone Encounter (Signed)
CALLED PATIENT TO REMIND OF PRE-SEED PLANNING CT FOR 04-12-19 - ARRIVAL TIME- 8:45 AM @ [place], SPOKE WITH PATIENT'S WIFE MARY AND SHE IS AWARE OF THIS APPT.

## 2019-02-21 ENCOUNTER — Telehealth: Payer: Self-pay | Admitting: *Deleted

## 2019-02-21 NOTE — Telephone Encounter (Signed)
CALLED PATIENT TO INFORM OF PRE-SEED PLANNING CT, LVM FOR A RETURN CALL. 

## 2019-03-13 ENCOUNTER — Other Ambulatory Visit: Payer: Self-pay | Admitting: Urology

## 2019-03-13 ENCOUNTER — Telehealth: Payer: Self-pay | Admitting: *Deleted

## 2019-03-13 NOTE — Telephone Encounter (Signed)
Called patient to inform of implant date, spoke with patient's wife- [name] and she is aware of this date.

## 2019-04-06 ENCOUNTER — Telehealth: Payer: Self-pay | Admitting: *Deleted

## 2019-04-06 NOTE — Telephone Encounter (Signed)
Called patient to inform of new pre-seed planning CT and new implant date, lvm for a return call

## 2019-04-12 ENCOUNTER — Ambulatory Visit: Payer: Self-pay | Admitting: Urology

## 2019-04-12 ENCOUNTER — Encounter (HOSPITAL_COMMUNITY): Admission: RE | Admit: 2019-04-12 | Payer: Medicare Other | Source: Ambulatory Visit

## 2019-04-12 ENCOUNTER — Ambulatory Visit: Admission: RE | Admit: 2019-04-12 | Payer: Medicare Other | Source: Ambulatory Visit | Admitting: Radiation Oncology

## 2019-04-17 ENCOUNTER — Telehealth: Payer: Self-pay | Admitting: Family Medicine

## 2019-04-17 NOTE — Telephone Encounter (Signed)
Copied from [place] [id number]. Topic: Quick Communication - Rx Refill/Question >> Apr 17, 2019  8:17 AM Keene Breath wrote: Medication: tamsulosin (FLOMAX) 0.4 MG CAPS capsule  Patient called to request a refill for the above medication.  Patient has been out of meds. For 3 days  Preferred Pharmacy (with phone number or street name): [id number] - [address], [place] AT [facility] [address] [phone] (Phone) [phone] (Fax)

## 2019-04-19 ENCOUNTER — Other Ambulatory Visit: Payer: Self-pay | Admitting: Family Medicine

## 2019-04-19 DIAGNOSIS — I10 Essential (primary) hypertension: Secondary | ICD-10-CM

## 2019-04-19 DIAGNOSIS — E785 Hyperlipidemia, unspecified: Secondary | ICD-10-CM

## 2019-04-19 DIAGNOSIS — E1159 Type 2 diabetes mellitus with other circulatory complications: Secondary | ICD-10-CM

## 2019-04-19 DIAGNOSIS — R350 Frequency of micturition: Secondary | ICD-10-CM

## 2019-04-26 ENCOUNTER — Other Ambulatory Visit: Payer: Self-pay | Admitting: Urology

## 2019-04-30 ENCOUNTER — Other Ambulatory Visit (HOSPITAL_COMMUNITY): Payer: Medicare Other

## 2019-05-17 ENCOUNTER — Ambulatory Visit: Payer: Medicare Other | Admitting: Urology

## 2019-05-17 ENCOUNTER — Encounter (HOSPITAL_COMMUNITY): Admission: RE | Admit: 2019-05-17 | Payer: Medicare Other | Source: Ambulatory Visit

## 2019-05-18 ENCOUNTER — Ambulatory Visit: Payer: Medicare Other | Admitting: Urology

## 2019-05-23 ENCOUNTER — Telehealth: Payer: Self-pay | Admitting: *Deleted

## 2019-05-23 NOTE — Telephone Encounter (Signed)
CALLED PATIENT TO REMIND OF PRE-SEED APPTS. FOR 05-24-19, CONFIRMED APPTS. WITH PATIENT'S WIFE

## 2019-05-24 ENCOUNTER — Other Ambulatory Visit: Payer: Self-pay

## 2019-05-24 ENCOUNTER — Other Ambulatory Visit: Payer: Self-pay | Admitting: Urology

## 2019-05-24 ENCOUNTER — Encounter (HOSPITAL_COMMUNITY)
Admission: RE | Admit: 2019-05-24 | Discharge: 2019-05-24 | Disposition: A | Discharge status: Home / Self Care | Payer: Medicare Other | Source: Ambulatory Visit | Attending: Urology | Admitting: Urology

## 2019-05-24 ENCOUNTER — Ambulatory Visit
Admission: RE | Admit: 2019-05-24 | Discharge: 2019-05-24 | Disposition: A | Discharge status: Home / Self Care | Payer: Medicare Other | Source: Ambulatory Visit | Attending: Urology | Admitting: Urology

## 2019-05-24 ENCOUNTER — Ambulatory Visit (HOSPITAL_COMMUNITY)
Admission: RE | Admit: 2019-05-24 | Discharge: 2019-05-24 | Disposition: A | Discharge status: Home / Self Care | Payer: Medicare Other | Source: Ambulatory Visit | Attending: Urology | Admitting: Urology

## 2019-05-24 DIAGNOSIS — C61 Malignant neoplasm of prostate: Secondary | ICD-10-CM

## 2019-05-24 NOTE — Progress Notes (Signed)
  Radiation Oncology         [phone]) [phone] ________________________________  Name: Tanner Griffin MRN: [id number]  Date: 05/24/2019  DOB: 10-20-1941  SIMULATION AND TREATMENT PLANNING NOTE PUBIC ARCH STUDY  KA:JGOTLX, Mortimer Fries, MD  [name], MD  DIAGNOSIS: 78 y.o. gentleman with Stage T1c adenocarcinoma of the prostate with Gleason score of 3+4, and PSA of 5.96.     ICD-10-CM   1. Malignant neoplasm of prostate ([place]) C61     COMPLEX SIMULATION:  The patient presented today for evaluation for possible prostate seed implant. He was brought to the radiation planning suite and placed supine on the CT couch. A 3-dimensional image study set was obtained in upload to the planning computer. There, on each axial slice, I contoured the prostate gland. Then, using three-dimensional radiation planning tools I reconstructed the prostate in view of the structures from the transperineal needle pathway to assess for possible pubic arch interference. In doing so, I did not appreciate any pubic arch interference. Also, the patient's prostate volume was estimated based on the drawn structure. The volume was 37 cc.  Given the pubic arch appearance and prostate volume, patient remains a good candidate to proceed with prostate seed implant. Today, he freely provided informed written consent to proceed.    PLAN: The patient will undergo prostate seed implant. ________________________________  Sheral Apley. Tammi Klippel, M.D.

## 2019-06-06 ENCOUNTER — Telehealth: Payer: Self-pay | Admitting: *Deleted

## 2019-06-06 NOTE — Telephone Encounter (Signed)
Called patient to inform of lab appt. For 06-12-19 - arrival time- 12:45 pm @ [facility] Admitting, spoke with patient's wife- [name] and she is aware of this appt.

## 2019-06-08 ENCOUNTER — Encounter (HOSPITAL_COMMUNITY): Payer: Medicare Other

## 2019-06-11 ENCOUNTER — Telehealth: Payer: Self-pay | Admitting: *Deleted

## 2019-06-11 NOTE — Telephone Encounter (Signed)
CALLED PATIENT TO REMIND OF LAB APPT. FOR 06-12-19, SPOKE WITH PATIENT'S WIFE- MARY AND SHE IS AWARE OF THIS APPT. - ARRIVAL TIME - 12:45 PM, PATIENT'S WIFE VERIFIED UNDERSTANDING THIS

## 2019-06-12 ENCOUNTER — Encounter (HOSPITAL_COMMUNITY)
Admission: RE | Admit: 2019-06-12 | Discharge: 2019-06-12 | Disposition: A | Discharge status: Home / Self Care | Payer: Medicare Other | Source: Ambulatory Visit | Attending: Urology | Admitting: Urology

## 2019-06-12 ENCOUNTER — Other Ambulatory Visit: Payer: Self-pay

## 2019-06-12 ENCOUNTER — Other Ambulatory Visit (HOSPITAL_COMMUNITY)
Admission: RE | Admit: 2019-06-12 | Discharge: 2019-06-12 | Disposition: A | Discharge status: Home / Self Care | Payer: Medicare Other | Source: Ambulatory Visit | Attending: Urology | Admitting: Urology

## 2019-06-12 DIAGNOSIS — N401 Enlarged prostate with lower urinary tract symptoms: Secondary | ICD-10-CM | POA: Diagnosis not present

## 2019-06-12 DIAGNOSIS — Z1159 Encounter for screening for other viral diseases: Secondary | ICD-10-CM | POA: Insufficient documentation

## 2019-06-12 DIAGNOSIS — I10 Essential (primary) hypertension: Secondary | ICD-10-CM | POA: Diagnosis not present

## 2019-06-12 DIAGNOSIS — Z791 Long term (current) use of non-steroidal anti-inflammatories (NSAID): Secondary | ICD-10-CM | POA: Diagnosis not present

## 2019-06-12 DIAGNOSIS — R3912 Poor urinary stream: Secondary | ICD-10-CM | POA: Diagnosis not present

## 2019-06-12 DIAGNOSIS — C61 Malignant neoplasm of prostate: Secondary | ICD-10-CM | POA: Diagnosis present

## 2019-06-12 DIAGNOSIS — Z7982 Long term (current) use of aspirin: Secondary | ICD-10-CM | POA: Diagnosis not present

## 2019-06-12 DIAGNOSIS — Z79899 Other long term (current) drug therapy: Secondary | ICD-10-CM | POA: Diagnosis not present

## 2019-06-12 DIAGNOSIS — Z01812 Encounter for preprocedural laboratory examination: Secondary | ICD-10-CM | POA: Insufficient documentation

## 2019-06-12 DIAGNOSIS — N138 Other obstructive and reflux uropathy: Secondary | ICD-10-CM | POA: Diagnosis not present

## 2019-06-12 DIAGNOSIS — E7800 Pure hypercholesterolemia, unspecified: Secondary | ICD-10-CM | POA: Diagnosis not present

## 2019-06-12 LAB — COMPREHENSIVE METABOLIC PANEL
ALT: 19 U/L (ref 0–44)
AST: 25 U/L (ref 15–41)
Albumin: 4.1 g/dL (ref 3.5–5.0)
Alkaline Phosphatase: 142 U/L — ABNORMAL HIGH (ref 38–126)
Anion gap: 10 (ref 5–15)
BUN: 18 mg/dL (ref 8–23)
CO2: 25 mmol/L (ref 22–32)
Calcium: 9.4 mg/dL (ref 8.9–10.3)
Chloride: 111 mmol/L (ref 98–111)
Creatinine, Ser: 0.89 mg/dL (ref 0.61–1.24)
GFR calc Af Amer: >60 mL/min (ref >60)
GFR calc non Af Amer: >60 mL/min (ref >60)
Glucose, Bld: 102 mg/dL — ABNORMAL HIGH (ref 70–99)
Potassium: 4 mmol/L (ref 3.5–5.1)
Sodium: 146 mmol/L — ABNORMAL HIGH (ref 135–145)
Total Bilirubin: 1.1 mg/dL (ref 0.3–1.2)
Total Protein: 7.1 g/dL (ref 6.5–8.1)

## 2019-06-12 LAB — PROTIME-INR
INR: 1 (ref 0.8–1.2)
Prothrombin Time: 12.8 seconds (ref 11.4–15.2)

## 2019-06-12 LAB — CBC
HCT: 38.4 % — ABNORMAL LOW (ref 39.0–52.0)
Hemoglobin: 12.4 g/dL — ABNORMAL LOW (ref 13.0–17.0)
MCH: 33 pg (ref 26.0–34.0)
MCHC: 32.3 g/dL (ref 30.0–36.0)
MCV: 102.1 fL — ABNORMAL HIGH (ref 80.0–100.0)
Platelets: 248 10*3/uL (ref 150–400)
RBC: 3.76 MIL/uL — ABNORMAL LOW (ref 4.22–5.81)
RDW: 12.1 % (ref 11.5–15.5)
WBC: 4.8 10*3/uL (ref 4.0–10.5)
nRBC: 0 % (ref 0.0–0.2)

## 2019-06-12 LAB — APTT: aPTT: 27 s (ref 24–36)

## 2019-06-13 ENCOUNTER — Other Ambulatory Visit (HOSPITAL_COMMUNITY)
Admission: RE | Admit: 2019-06-13 | Discharge: 2019-06-13 | Disposition: A | Discharge status: Home / Self Care | Payer: Medicare Other | Source: Ambulatory Visit | Attending: Urology | Admitting: Urology

## 2019-06-13 ENCOUNTER — Encounter (HOSPITAL_BASED_OUTPATIENT_CLINIC_OR_DEPARTMENT_OTHER): Payer: Self-pay | Admitting: Emergency Medicine

## 2019-06-13 DIAGNOSIS — C61 Malignant neoplasm of prostate: Secondary | ICD-10-CM | POA: Diagnosis not present

## 2019-06-13 LAB — SARS CORONAVIRUS 2 (TAT 6-24 HRS)

## 2019-06-13 LAB — SARS CORONAVIRUS 2 BY RT PCR (HOSPITAL ORDER, PERFORMED IN ~~LOC~~ HOSPITAL LAB): SARS Coronavirus 2: NEGATIVE

## 2019-06-13 NOTE — Progress Notes (Signed)
Rapid Covid scheduled 6/24 for inconclusive results.

## 2019-06-13 NOTE — Progress Notes (Signed)
SPOKE WITH: patients wife and DPR , Tanner Griffin  ARRIVAL TIME: 0900 RIDING HOME WITH: wife  [phone] NPO STATUS: after mn  AM MEDICATIONS: amlodipine, atorvastatin  PRE-OP ORDERS: y LABS: completed see epic  COMMENTS/CONCERNS: n/a  ICE,;RN, BSN.

## 2019-06-14 ENCOUNTER — Telehealth: Payer: Self-pay | Admitting: *Deleted

## 2019-06-14 NOTE — Telephone Encounter (Signed)
CALLED PATIENT TO REMIND OF PROCEDURE FOR 06-15-19, SPOKE WITH PATIENT'S WIFE- MARY AND SHE IS AWARE OF THIS PROCEDURE

## 2019-06-14 NOTE — H&P (Signed)
HPI: Tanner Griffin is a 78 year-old male with prostate cancer.  His prostate cancer was diagnosed 01/23/2019. His PSA at his time of diagnosis was 5.96. His cancer was T1c, Gleason 3+4.   Adenocarcinoma the prostate: PSA 11/14/18 - 5.96, DRE - benign  TRUS/Bx 01/23/19: Prostate volume 37 cc  Pathology: Adenocarcinoma 3+4=7 in 2 cores and 3+3=6 in 2 cores, total of 4/12 cores positive.  Stage: T1c     ALLERGIES: No Allergies    MEDICATIONS: Aspirin 325 MG Oral Tablet Oral  Hydrochlorothiazide 25 mg tablet Oral  Ibuprofen 800 mg tablet Oral  Labetalol Hcl 200 mg tablet Oral  Lisinopril-Hydrochlorothiazide 20 mg-25 mg tablet Oral  Meloxicam 15 mg tablet Oral  Methocarbamol 500 mg tablet Oral  Nitroglycerin 0.3 mg tablet, sublingual Sublingual  Simvastatin 40 mg tablet Oral  Tamsulosin HCl - 0.4 MG Oral Capsule 0 Oral     GU PSH: Prostate Needle Biopsy - 01/23/2019      PSH Notes: Knee Replacement   NON-GU PSH: Revise Knee Joint - 2011 Surgical Pathology, Gross And Microscopic Examination For Prostate Needle - 01/23/2019    GU PMH: Elevated PSA (Stable), I have discussed with the patient the possibility of blood per rectum, per urethra and in the ejaculate. He was counseled to contact me if he has any difficulties following his prostate biopsy whatsoever. - 01/23/2019, I noted no abnormality on his DRE. I am going to have him stop his aspirin and he has elected to proceed with a prostate biopsy., - 01/02/2019 Urinary Urgency, He had no sign of infection and was emptying his bladder adequately therefore I am going to place him on a trial of Myrbetriq 25 mg. - 01/02/2019 BPH w/LUTS, Benign prostatic hyperplasia with urinary obstruction - 2014 Inflammatory Disease Prostate, Unspec, Prostatitis - 2014 Weak Urinary Stream, Weak urinary stream - 2014      PMH Notes: BPH with LUTS: The patient noted slowing of his urinary stream after having undergone orthopedic surgery however he was taking pain  medication and was having difficulty with constipation at the time. I felt that it was a constipation that was contributing to his slow urinary stream.     NON-GU PMH: Personal history of other diseases of the circulatory system, History of hypertension - 2014 Personal history of other endocrine, nutritional and metabolic disease, History of hypercholesterolemia - 2014    FAMILY HISTORY: Diabetes - Runs In Family Hypertension - Runs In Family   SOCIAL HISTORY: Marital Status: Married     Notes: Never A Smoker, Marital History - Currently Married, Caffeine Use, Alcohol Use, Tobacco Use   REVIEW OF SYSTEMS:    GU Review Male:   Patient denies frequent urination, hard to postpone urination, burning/ pain with urination, get up at night to urinate, leakage of urine, stream starts and stops, trouble starting your stream, have to strain to urinate , erection problems, and penile pain.  Gastrointestinal (Upper):   Patient denies nausea, vomiting, and indigestion/ heartburn.  Gastrointestinal (Lower):   Patient denies diarrhea and constipation.  Constitutional:   Patient denies fever, night sweats, weight loss, and fatigue.  Skin:   Patient denies skin rash/ lesion and itching.  Eyes:   Patient denies blurred vision and double vision.  Ears/ Nose/ Throat:   Patient denies sore throat and sinus problems.  Hematologic/Lymphatic:   Patient denies swollen glands and easy bruising.  Cardiovascular:   Patient denies leg swelling and chest pains.  Respiratory:   Patient denies cough and shortness  of breath.  Endocrine:   Patient denies excessive thirst.  Musculoskeletal:   Patient denies back pain and joint pain.  Neurological:   Patient denies headaches and dizziness.  Psychologic:   Patient denies depression and anxiety.   VITAL SIGNS:    Weight 178 lb / 80.74 kg  Height 68 in / 172.72 cm  BP 153/75 mmHg  Pulse 69 /min  BMI 27.1 kg/m   GU PHYSICAL EXAMINATION:    Anus and Perineum: No  hemorrhoids. No anal stenosis. No rectal fissure, no anal fissure. No edema, no dimple, no perineal tenderness, no anal tenderness.  Scrotum: No lesions. No edema. No cysts. No warts.  Epididymides: Right: no spermatocele, no masses, no cysts, no tenderness, no induration, no enlargement. Left: no spermatocele, no masses, no cysts, no tenderness, no induration, no enlargement.  Testes: Atrophic left testis. Atrophic right testis. No tenderness, no swelling, no enlargement left testis. No tenderness, no swelling, no enlargement right testis. Normal location left testis. Normal location right testis. No mass, no cyst, no varicocele, no hydrocele left testis. No mass, no cyst, no varicocele, no hydrocele right testis.   Urethral Meatus: Normal size. No lesion, no wart, no discharge, no polyp. Normal location.  Penis: Circumcised, no warts, no cracks. No dorsal Peyronie's plaques, no left corporal Peyronie's plaques, no right corporal Peyronie's plaques, no scarring, no warts. No balanitis, no meatal stenosis.  Prostate: Prostate 2 1/2+ size. Left lobe normal consistency, right lobe normal consistency. Symmetrical lobes. No prostate nodule. Left lobe no tenderness, right lobe no tenderness.   Seminal Vesicles: Nonpalpable.  Sphincter Tone: Normal sphincter. No rectal tenderness. No rectal mass.    MULTI-SYSTEM PHYSICAL EXAMINATION:    Constitutional: Well-nourished. No physical deformities. Normally developed. Good grooming.  Neck: Neck symmetrical, not swollen. Normal tracheal position.  Respiratory: No labored breathing, no use of accessory muscles.   Cardiovascular: Normal temperature, normal extremity pulses, no swelling, no varicosities.  Lymphatic: No enlargement of neck, axillae, groin.  Skin: No paleness, no jaundice, no cyanosis. No lesion, no ulcer, no rash.  Neurologic / Psychiatric: Oriented to time, oriented to place, oriented to person. No depression, no anxiety, no agitation.   Gastrointestinal: No mass, no tenderness, no rigidity, non obese abdomen.  Eyes: Normal conjunctivae. Normal eyelids.  Ears, Nose, Mouth, and Throat: Left ear no scars, no lesions, no masses. Right ear no scars, no lesions, no masses. Nose no scars, no lesions, no masses. Normal hearing. Normal lips.  Musculoskeletal: Normal gait and station of head and neck.    PAST DATA REVIEWED:  Source Of History:  Patient   11/14/18 02/27/18  PSA  Total PSA 5.96 ng/dl 4.05 ng/dl    PROCEDURES: None   ASSESSMENT/PLAN:     ICD-10 Details  1 GU:   Prostate Cancer - I went over his pathology report with him as well as the significance of his Gleason score, number and location of cores positive and percent of cores positive. We then discussed his Partin table results in detail and the significance of these predictions as far as prognosis and need for further workup are concerned. I then discussed with him the various options available including active surveillance and treatment for cure such as radiation and surgery. We briefly discussed the forms of radiation available.  He is interested in and would be a good candidate for radioactive seed implant.

## 2019-06-15 ENCOUNTER — Ambulatory Visit (HOSPITAL_BASED_OUTPATIENT_CLINIC_OR_DEPARTMENT_OTHER)
Admission: RE | Admit: 2019-06-15 | Discharge: 2019-06-15 | Disposition: A | Discharge status: Home / Self Care | Payer: Medicare Other | Attending: Urology | Admitting: Urology

## 2019-06-15 ENCOUNTER — Ambulatory Visit (HOSPITAL_BASED_OUTPATIENT_CLINIC_OR_DEPARTMENT_OTHER): Payer: Medicare Other | Admitting: Certified Registered"

## 2019-06-15 ENCOUNTER — Encounter: Payer: Self-pay | Admitting: Medical Oncology

## 2019-06-15 ENCOUNTER — Encounter (HOSPITAL_BASED_OUTPATIENT_CLINIC_OR_DEPARTMENT_OTHER)
Admission: RE | Disposition: A | Discharge status: Home / Self Care | Payer: Self-pay | Source: Home / Self Care | Attending: Urology

## 2019-06-15 ENCOUNTER — Ambulatory Visit (HOSPITAL_BASED_OUTPATIENT_CLINIC_OR_DEPARTMENT_OTHER): Payer: Medicare Other | Admitting: Physician Assistant

## 2019-06-15 ENCOUNTER — Other Ambulatory Visit: Payer: Self-pay

## 2019-06-15 ENCOUNTER — Encounter (HOSPITAL_BASED_OUTPATIENT_CLINIC_OR_DEPARTMENT_OTHER): Payer: Self-pay | Admitting: Emergency Medicine

## 2019-06-15 ENCOUNTER — Ambulatory Visit (HOSPITAL_COMMUNITY): Payer: Medicare Other

## 2019-06-15 DIAGNOSIS — R3912 Poor urinary stream: Secondary | ICD-10-CM | POA: Insufficient documentation

## 2019-06-15 DIAGNOSIS — N401 Enlarged prostate with lower urinary tract symptoms: Secondary | ICD-10-CM | POA: Diagnosis not present

## 2019-06-15 DIAGNOSIS — Z79899 Other long term (current) drug therapy: Secondary | ICD-10-CM | POA: Insufficient documentation

## 2019-06-15 DIAGNOSIS — N138 Other obstructive and reflux uropathy: Secondary | ICD-10-CM | POA: Insufficient documentation

## 2019-06-15 DIAGNOSIS — E7800 Pure hypercholesterolemia, unspecified: Secondary | ICD-10-CM | POA: Insufficient documentation

## 2019-06-15 DIAGNOSIS — Z791 Long term (current) use of non-steroidal anti-inflammatories (NSAID): Secondary | ICD-10-CM | POA: Insufficient documentation

## 2019-06-15 DIAGNOSIS — Z1159 Encounter for screening for other viral diseases: Secondary | ICD-10-CM | POA: Insufficient documentation

## 2019-06-15 DIAGNOSIS — I10 Essential (primary) hypertension: Secondary | ICD-10-CM | POA: Insufficient documentation

## 2019-06-15 DIAGNOSIS — C61 Malignant neoplasm of prostate: Secondary | ICD-10-CM | POA: Insufficient documentation

## 2019-06-15 DIAGNOSIS — Z7982 Long term (current) use of aspirin: Secondary | ICD-10-CM | POA: Insufficient documentation

## 2019-06-15 HISTORY — PX: RADIOACTIVE SEED IMPLANT: SHX5150

## 2019-06-15 HISTORY — DX: Presence of dental prosthetic device (complete) (partial): Z97.2

## 2019-06-15 HISTORY — PX: SPACE OAR INSTILLATION: SHX6769

## 2019-06-15 HISTORY — DX: Presence of spectacles and contact lenses: Z97.3

## 2019-06-15 SURGERY — INSERTION, RADIATION SOURCE, PROSTATE
Anesthesia: General | Site: Prostate

## 2019-06-15 MED ORDER — CIPROFLOXACIN IN D5W 400 MG/200ML IV SOLN
INTRAVENOUS | Status: AC
  Filled 2019-06-15: qty 200

## 2019-06-15 MED ORDER — LACTATED RINGERS IV SOLN
INTRAVENOUS | Status: DC
  Administered 2019-06-15: 14:00:00 via INTRAVENOUS
  Filled 2019-06-15: qty 1000

## 2019-06-15 MED ORDER — KETOROLAC TROMETHAMINE 15 MG/ML IJ SOLN
INTRAMUSCULAR | Status: DC | PRN
  Administered 2019-06-15: 15 mg via INTRAVENOUS

## 2019-06-15 MED ORDER — PROPOFOL 10 MG/ML IV BOLUS
INTRAVENOUS | Status: DC | PRN
  Administered 2019-06-15: 150 mg via INTRAVENOUS

## 2019-06-15 MED ORDER — FLEET ENEMA 7-19 GM/118ML RE ENEM
1.0000 | ENEMA | Freq: Once | RECTAL | Status: DC
  Filled 2019-06-15: qty 1

## 2019-06-15 MED ORDER — FENTANYL CITRATE (PF) 100 MCG/2ML IJ SOLN
INTRAMUSCULAR | Status: DC | PRN
  Administered 2019-06-15: 12.5 ug via INTRAVENOUS
  Administered 2019-06-15 (×2): 25 ug via INTRAVENOUS
  Administered 2019-06-15: 12.5 ug via INTRAVENOUS
  Administered 2019-06-15: 25 ug via INTRAVENOUS

## 2019-06-15 MED ORDER — SODIUM CHLORIDE (PF) 0.9 % IJ SOLN
INTRAMUSCULAR | Status: DC | PRN
  Administered 2019-06-15: 10 mL

## 2019-06-15 MED ORDER — LIDOCAINE HCL (CARDIAC) PF 100 MG/5ML IV SOSY
PREFILLED_SYRINGE | INTRAVENOUS | Status: DC | PRN
  Administered 2019-06-15: 100 mg via INTRAVENOUS

## 2019-06-15 MED ORDER — ONDANSETRON HCL 4 MG/2ML IJ SOLN
INTRAMUSCULAR | Status: AC
  Filled 2019-06-15: qty 2

## 2019-06-15 MED ORDER — HYDROCODONE-ACETAMINOPHEN 10-325 MG PO TABS: 1.0000 | ORAL_TABLET | ORAL | 0 refills | Status: DC | PRN

## 2019-06-15 MED ORDER — LIDOCAINE 2% (20 MG/ML) 5 ML SYRINGE
INTRAMUSCULAR | Status: AC
  Filled 2019-06-15: qty 5

## 2019-06-15 MED ORDER — MEPERIDINE HCL 25 MG/ML IJ SOLN
6.2500 mg | INTRAMUSCULAR | Status: DC | PRN
  Filled 2019-06-15: qty 1

## 2019-06-15 MED ORDER — FENTANYL CITRATE (PF) 100 MCG/2ML IJ SOLN
25.0000 ug | INTRAMUSCULAR | Status: DC | PRN
  Filled 2019-06-15: qty 1

## 2019-06-15 MED ORDER — PROPOFOL 10 MG/ML IV BOLUS
INTRAVENOUS | Status: AC
  Filled 2019-06-15: qty 20

## 2019-06-15 MED ORDER — GLYCOPYRROLATE PF 0.2 MG/ML IJ SOSY
PREFILLED_SYRINGE | INTRAMUSCULAR | Status: AC
  Filled 2019-06-15: qty 1

## 2019-06-15 MED ORDER — EPHEDRINE SULFATE 50 MG/ML IJ SOLN
INTRAMUSCULAR | Status: DC | PRN
  Administered 2019-06-15: 10 mg via INTRAVENOUS

## 2019-06-15 MED ORDER — METOCLOPRAMIDE HCL 5 MG/ML IJ SOLN
10.0000 mg | Freq: Once | INTRAMUSCULAR | Status: DC | PRN
  Filled 2019-06-15: qty 2

## 2019-06-15 MED ORDER — FENTANYL CITRATE (PF) 100 MCG/2ML IJ SOLN
INTRAMUSCULAR | Status: AC
  Filled 2019-06-15: qty 2

## 2019-06-15 MED ORDER — GLYCOPYRROLATE 0.2 MG/ML IJ SOLN
INTRAMUSCULAR | Status: DC | PRN
  Administered 2019-06-15: 0.2 mg via INTRAVENOUS

## 2019-06-15 MED ORDER — ONDANSETRON HCL 4 MG/2ML IJ SOLN
INTRAMUSCULAR | Status: DC | PRN
  Administered 2019-06-15: 4 mg via INTRAVENOUS

## 2019-06-15 MED ORDER — CIPROFLOXACIN IN D5W 400 MG/200ML IV SOLN
400.0000 mg | INTRAVENOUS | Status: AC
  Administered 2019-06-15: 400 mg via INTRAVENOUS
  Filled 2019-06-15: qty 200

## 2019-06-15 MED ORDER — EPHEDRINE 5 MG/ML INJ
INTRAVENOUS | Status: AC
  Filled 2019-06-15: qty 10

## 2019-06-15 MED ORDER — IOHEXOL 300 MG/ML  SOLN
INTRAMUSCULAR | Status: DC | PRN
  Administered 2019-06-15: 7 mL

## 2019-06-15 MED ORDER — STERILE WATER FOR IRRIGATION IR SOLN
Status: DC | PRN
  Administered 2019-06-15: 500 mL

## 2019-06-15 MED ORDER — LACTATED RINGERS IV SOLN
INTRAVENOUS | Status: DC
  Administered 2019-06-15: 09:00:00 via INTRAVENOUS
  Filled 2019-06-15: qty 1000

## 2019-06-15 MED ORDER — DEXAMETHASONE SODIUM PHOSPHATE 10 MG/ML IJ SOLN
INTRAMUSCULAR | Status: AC
  Filled 2019-06-15: qty 1

## 2019-06-15 MED ORDER — DEXAMETHASONE SODIUM PHOSPHATE 4 MG/ML IJ SOLN
INTRAMUSCULAR | Status: DC | PRN
  Administered 2019-06-15: 10 mg via INTRAVENOUS

## 2019-06-15 SURGICAL SUPPLY — 43 items
BAG URINE DRAINAGE (UROLOGICAL SUPPLIES) ×2 IMPLANT
BLADE CLIPPER SENSICLIP SURGIC (BLADE) ×2 IMPLANT
CATH FOLEY 2WAY SLVR  5CC 16FR (CATHETERS) ×1
CATH FOLEY 2WAY SLVR 5CC 16FR (CATHETERS) ×1 IMPLANT
CATH ROBINSON RED A/P 16FR (CATHETERS) IMPLANT
CATH ROBINSON RED A/P 20FR (CATHETERS) ×2 IMPLANT
CLOTH BEACON ORANGE TIMEOUT ST (SAFETY) ×2 IMPLANT
CONT SPECI 4OZ STER CLIK (MISCELLANEOUS) ×5 IMPLANT
COVER BACK TABLE 60X90IN (DRAPES) ×2 IMPLANT
COVER MAYO STAND STRL (DRAPES) ×2 IMPLANT
COVER WAND RF STERILE (DRAPES) ×1 IMPLANT
DRSG TEGADERM 4X4.75 (GAUZE/BANDAGES/DRESSINGS) ×5 IMPLANT
DRSG TEGADERM 8X12 (GAUZE/BANDAGES/DRESSINGS) ×4 IMPLANT
GAUZE SPONGE 4X4 12PLY STRL LF (GAUZE/BANDAGES/DRESSINGS) ×1 IMPLANT
GLOVE BIO SURGEON STRL SZ 6 (GLOVE) IMPLANT
GLOVE BIO SURGEON STRL SZ 6.5 (GLOVE) IMPLANT
GLOVE BIO SURGEON STRL SZ7 (GLOVE) IMPLANT
GLOVE BIO SURGEON STRL SZ8 (GLOVE) ×2 IMPLANT
GLOVE BIOGEL PI IND STRL 6 (GLOVE) IMPLANT
GLOVE BIOGEL PI IND STRL 6.5 (GLOVE) IMPLANT
GLOVE BIOGEL PI IND STRL 7.0 (GLOVE) IMPLANT
GLOVE BIOGEL PI IND STRL 8 (GLOVE) IMPLANT
GLOVE BIOGEL PI INDICATOR 6 (GLOVE) ×1
GLOVE BIOGEL PI INDICATOR 6.5 (GLOVE)
GLOVE BIOGEL PI INDICATOR 7.0 (GLOVE)
GLOVE BIOGEL PI INDICATOR 8 (GLOVE) ×1
GLOVE ECLIPSE 8.0 STRL XLNG CF (GLOVE) ×4 IMPLANT
GOWN STRL REUS W/TWL XL LVL3 (GOWN DISPOSABLE) ×2 IMPLANT
HOLDER FOLEY CATH W/STRAP (MISCELLANEOUS) IMPLANT
IMPL SPACEOAR SYSTEM 10ML (Spacer) ×1 IMPLANT
IMPLANT SPACEOAR SYSTEM 10ML (Spacer) ×2 IMPLANT
IV NS 1000ML (IV SOLUTION) ×1
IV NS 1000ML BAXH (IV SOLUTION) ×1 IMPLANT
KIT TURNOVER CYSTO (KITS) ×2 IMPLANT
MARKER SKIN DUAL TIP RULER LAB (MISCELLANEOUS) ×2 IMPLANT
PACK CYSTO (CUSTOM PROCEDURE TRAY) ×2 IMPLANT
Radioactive Seed ×76 IMPLANT
SURGILUBE 2OZ TUBE FLIPTOP (MISCELLANEOUS) ×2 IMPLANT
SUT BONE WAX W31G (SUTURE) IMPLANT
SYR 10ML LL (SYRINGE) ×2 IMPLANT
TOWEL OR 17X26 10 PK STRL BLUE (TOWEL DISPOSABLE) ×4 IMPLANT
UNDERPAD 30X30 (UNDERPADS AND DIAPERS) ×4 IMPLANT
WATER STERILE IRR 500ML POUR (IV SOLUTION) ×2 IMPLANT

## 2019-06-15 NOTE — Discharge Instructions (Signed)
DISCHARGE INSTRUCTIONS FOR PROSTATE SEED IMPLANTATION ° °Antibiotics °You may be given a prescription for an antibiotic to take when you arrive home. If so, be sure to take every tablet in the bottle, even if you are feeling better before the prescription is finished. If you begin itching, notice a rash or start to swell on your trunk, arms, legs and/or throat, immediately stop taking the antibiotic and call your Urologist. °Diet °Resume your usual diet when you return home. To keep your bowels moving easily and softly, drink prune, apple and cranberry juice at room temperature. You may also take a stool softener, such as Colace, which is available without prescription at local pharmacies. °Daily activities °? No driving or heavy lifting for at least two days after the implant. °? No bike riding, horseback riding or riding lawn mowers for the first month after the implant. °? Any strenuous physical activity should be approved by your doctor before you resume it. °Sexual relations °You may resume sexual relations two weeks after the procedure. A condom should be used for the first two weeks. Your semen may be dark brown or black; this is normal and is related bleeding that may have occurred during the implant. °Postoperative swelling °Expect swelling and bruising of the scrotum and perineum (the area between the scrotum and anus). Both the swelling and the bruising should resolve in l or 2 weeks. Ice packs and over- the-counter medications such as Tylenol, Advil or Aleve may lessen your discomfort. °Postoperative urination °Most men experience burning on urination and/or urinary frequency. If this becomes bothersome, contact your Urologist.  Medication can be prescribed to relieve these problems.  It is normal to have some blood in your urine for a few days after the implant. °Special instructions related to the seeds °It is unlikely that you will pass an Iodine-125 seed in your urine. The seeds are silver in color  and are about as large as a grain of rice. If you pass a seed, do not handle it with your fingers. Use a spoon to place it in an envelope or jar in place this in base occluded area such as the garage or basement for return to the radiation clinic at your convenience. ° °Contact your doctor for °? Temperature greater than 101 F °? Increasing pain °? Inability to urinate °Follow-up ° You should have follow up with your urologist and radiation oncologist about 3 weeks after the procedure. °General information regarding Iodine seeds °? Iodine-125 is a low energy radioactive material. It is not deeply penetrating and loses energy at short distances. Your prostate will absorb the radiation. Objects that are touched or used by the patient do not become radioactive. °? Body wastes (urine and stool) or body fluids (saliva, tears, semen or blood) are not radioactive. °? The Nuclear Regulatory Commission (NRC) has determined that no radiation precautions are needed for patients undergoing Iodine-125 seed implantation. The NRC states that such patients do not present a risk to the people around them, including young children and pregnant women. However, in keeping with the general principle that radiation exposure should be kept as low reasonably possible, we suggest the following: °? Children and pets should not sit on the patient's lap for the first two (2) weeks after the implant. °? Pregnant (or possibly pregnant) women should avoid prolonged, close contact with the patient for the first two (2) weeks after the implant. °? A distance of three (3) feet is acceptable. °? At a distance of three (3)   with the patient.     Post Anesthesia Home Care Instructions  Activity: Get plenty of rest for the remainder of the day. A responsible individual must stay with you for 24 hours following the procedure.  For the next 24 hours, DO NOT: -Drive a car -Operate machinery -Drink alcoholic  beverages -Take any medication unless instructed by your physician -Make any legal decisions or sign important papers.  Meals: Start with liquid foods such as gelatin or soup. Progress to regular foods as tolerated. Avoid greasy, spicy, heavy foods. If nausea and/or vomiting occur, drink only clear liquids until the nausea and/or vomiting subsides. Call your physician if vomiting continues.  Special Instructions/Symptoms: Your throat may feel dry or sore from the anesthesia or the breathing tube placed in your throat during surgery. If this causes discomfort, gargle with warm salt water. The discomfort should disappear within 24 hours.      

## 2019-06-15 NOTE — Transfer of Care (Signed)
Immediate Anesthesia Transfer of Care Note  Patient: Tanner Griffin  Procedure(s) Performed: Procedure(s) (LRB): RADIOACTIVE SEED IMPLANT/BRACHYTHERAPY IMPLANT (N/A) SPACE OAR INSTILLATION (N/A)  Patient Location: PACU  Anesthesia Type: General  Level of Consciousness: awake, sedated, patient cooperative and responds to stimulation  Airway & Oxygen Therapy: Patient Spontanous Breathing and Patient connected to [place] o2 with soft face mask  Post-op Assessment: Report given to PACU RN, Post -op Vital signs reviewed and stable and Patient moving all extremities  Post vital signs: Reviewed and stable  Complications: No apparent anesthesia complications

## 2019-06-15 NOTE — Anesthesia Postprocedure Evaluation (Signed)
Anesthesia Post Note  Patient: Tanner Griffin  Procedure(s) Performed: RADIOACTIVE SEED IMPLANT/BRACHYTHERAPY IMPLANT (N/A Prostate) SPACE OAR INSTILLATION (N/A Prostate)     Patient location during evaluation: PACU Anesthesia Type: General Level of consciousness: awake and alert Pain management: pain level controlled Vital Signs Assessment: post-procedure vital signs reviewed and stable Respiratory status: spontaneous breathing, nonlabored ventilation, respiratory function stable and patient connected to nasal cannula oxygen Cardiovascular status: blood pressure returned to baseline and stable Postop Assessment: no apparent nausea or vomiting Anesthetic complications: no    Last Vitals:  Vitals:   06/15/19 0843 06/15/19 1300  BP: (!) 162/79 (!) 155/70  Pulse: (!) 50 74  Resp: 16 10  Temp: 36.7 C (!) 36.3 C  SpO2: 99% 100%    Last Pain:  Vitals:   06/15/19 0843  TempSrc: Oral                 Montez Hageman

## 2019-06-15 NOTE — Op Note (Signed)
PATIENT:  Tanner Griffin  PRE-OPERATIVE DIAGNOSIS:  Adenocarcinoma of the prostate  POST-OPERATIVE DIAGNOSIS:  Same  PROCEDURE:  1. I-125 radioactive seed implantation 2. Cystoscopy  3. Placement of SpaceOAR 4. Fluoroscopy use with time less than 1 hour.  SURGEON:  Surgeon(s): Claybon Jabs  Radiation oncologist: Dr. Tyler Pita  ANESTHESIA:  General  EBL:  Minimal  DRAINS: None  INDICATION: Tanner Griffin is a 78 year old male with biopsy-proven adenocarcinoma the prostate, PSA of 5.96 and 2 cores containing 3+4 = 7 and 2 cores with 3+3 adenocarcinoma.  Stage T1c.  The patient presents for I-125 seed implant and SpaceOAR.  Description of procedure: After informed consent the patient was brought to the major OR, placed on the table and administered general anesthesia. He was then moved to the modified lithotomy position with his perineum perpendicular to the floor. His perineum and genitalia were then sterilely prepped. An official timeout was then performed. A 16 French Foley catheter was then placed in the bladder and filled with dilute contrast, a rectal tube was placed in the rectum and the transrectal ultrasound probe was placed in the rectum and affixed to the stand. He was then sterilely draped.  Real time ultrasonography was used along with the seed planning software On[employer] Prostate vs. 4.2.2.4. This was used to develop the seed plan including the number of needles as well as number of seeds required for complete and adequate coverage.  The needles were then preloaded with seeds and spacers according to the previously developed plan.  Real-time ultrasonography was then used along with the previously developed plan to implant a total of 76 seeds using 29 needles. This proceeded without difficulty or complication.   I then proceeded with placement of SpaceOAR by introducing a needle with the bevel angled inferiorly approximately 2 cm superior to the anus. This was angled  downward and under direct ultrasound was placed within the space between the prostatic capsule and rectum. This was confirmed with a small amount of sterile saline injected and this was performed under direct ultrasound. I then attached the SpaceOAR to the needle and injected this in the space between the prostate and rectum with good placement noted.  A Foley catheter was then removed as well as the transrectal ultrasound probe and rectal probe. Flexible cystoscopy was then performed using the 17 French flexible scope which revealed a normal urethra throughout its length down to the sphincter which appeared intact. The prostatic urethra revealed bilobar hypertrophy but no evidence of obstruction, seeds, spacers or lesions. The bladder was then entered and fully and systematically inspected. The ureteral orifices were noted to be of normal configuration and position. The mucosa revealed no evidence of tumors. There were also no stones identified within the bladder. I noted no seeds or spacers on the floor of the bladder and retroflexion of the scope revealed no seeds protruding from the base of the prostate.  C-arm fluoroscopy was then used to evaluate the distribution of seeds placement and aid in determining confirmation of the number of seeds placed.  Real-time fluoroscopy was used with saved images revealing the location of the seeds placed both in AP and oblique views.  The cystoscope was then removed and the patient was awakened and taken to recovery room in stable and satisfactory condition. He tolerated procedure well and there were no intraoperative complications.

## 2019-06-15 NOTE — Anesthesia Procedure Notes (Signed)
Procedure Name: LMA Insertion Date/Time: 06/15/2019 11:26 AM Performed by: Justice Rocher, CRNA Pre-anesthesia Checklist: Patient identified, Emergency Drugs available, Suction available and Patient being monitored Patient Re-evaluated:Patient Re-evaluated prior to induction Oxygen Delivery Method: Circle system utilized Preoxygenation: Pre-oxygenation with 100% oxygen Induction Type: IV induction Ventilation: Mask ventilation without difficulty LMA: LMA inserted LMA Size: 4.0 Number of attempts: 1 Airway Equipment and Method: Bite block Placement Confirmation: positive ETCO2 and breath sounds checked- equal and bilateral Tube secured with: Tape Dental Injury: Teeth and Oropharynx as per pre-operative assessment

## 2019-06-15 NOTE — Anesthesia Preprocedure Evaluation (Signed)
Anesthesia Evaluation  Patient identified by MRN, date of birth, ID band Patient awake    Reviewed: Allergy & Precautions, NPO status , Patient's Chart, lab work & pertinent test results  Airway Mallampati: II  TM Distance: >3 FB Neck ROM: Full    Dental no notable dental hx. (+) Upper Dentures   Pulmonary neg pulmonary ROS,    Pulmonary exam normal breath sounds clear to auscultation       Cardiovascular hypertension, Pt. on medications Normal cardiovascular exam Rhythm:Regular Rate:Normal     Neuro/Psych negative neurological ROS  negative psych ROS   GI/Hepatic negative GI ROS, Neg liver ROS,   Endo/Other  negative endocrine ROS  Renal/GU negative Renal ROS  negative genitourinary   Musculoskeletal negative musculoskeletal ROS (+)   Abdominal   Peds negative pediatric ROS (+)  Hematology negative hematology ROS (+)   Anesthesia Other Findings   Reproductive/Obstetrics negative OB ROS                             Anesthesia Physical Anesthesia Plan  ASA: II  Anesthesia Plan: General   Post-op Pain Management:    Induction: Intravenous  PONV Risk Score and Plan: 2 and Ondansetron and Treatment may vary due to age or medical condition  Airway Management Planned: LMA and Oral ETT  Additional Equipment:   Intra-op Plan:   Post-operative Plan: Extubation in OR  Informed Consent: I have reviewed the patients History and Physical, chart, labs and discussed the procedure including the risks, benefits and alternatives for the proposed anesthesia with the patient or authorized representative who has indicated his/her understanding and acceptance.     Dental advisory given  Plan Discussed with: CRNA  Anesthesia Plan Comments:         Anesthesia Quick Evaluation

## 2019-06-17 NOTE — Progress Notes (Signed)
  Radiation Oncology         ([phone]) [phone] ________________________________  Name: NICANDRO PERRAULT MRN: [id number]  Date: 06/17/2019  DOB: 1941/10/27       Prostate Seed Implant  [id number], Mortimer Fries, MD  No ref. provider found  DIAGNOSIS: 78 y.o. gentleman with Stage T1c adenocarcinoma of the prostate with Gleason score of 3+4, and PSA of 5.96.    ICD-10-CM   1. Prostate cancer ([place])  C61 DG Chest 2 View    DG Chest 2 View    Discharge patient    PROCEDURE: Insertion of radioactive I-125 seeds into the prostate gland.  RADIATION DOSE: 145 Gy, definitive therapy.  TECHNIQUE: JIOVANNY BURDELL was brought to the operating room with the urologist. He was placed in the dorsolithotomy position. He was catheterized and a rectal tube was inserted. The perineum was shaved, prepped and draped. The ultrasound probe was then introduced into the rectum to see the prostate gland.  TREATMENT DEVICE: A needle grid was attached to the ultrasound probe stand and anchor needles were placed.  3D PLANNING: The prostate was imaged in 3D using a sagittal sweep of the prostate probe. These images were transferred to the planning computer. There, the prostate, urethra and rectum were defined on each axial reconstructed image. Then, the software created an optimized 3D plan and a few seed positions were adjusted. The quality of the plan was reviewed using [facility] information for the target and the following two organs at risk:  Urethra and Rectum.  Then the accepted plan was printed and handed off to the radiation therapist.  Under my supervision, the custom loading of the seeds and spacers was carried out and loaded into sealed vicryl sleeves.  These pre-loaded needles were then placed into the needle holder.[name]  PROSTATE VOLUME STUDY:  Using transrectal ultrasound the volume of the prostate was verified to be 41.3 cc.  SPECIAL TREATMENT PROCEDURE/SUPERVISION AND HANDLING: The pre-loaded needles were then  delivered under sagittal guidance. A total of 29 needles were used to deposit 76 seeds in the prostate gland. The individual seed activity was 0.445 mCi.  SpaceOAR:  Yes  COMPLEX SIMULATION: At the end of the procedure, an anterior radiograph of the pelvis was obtained to document seed positioning and count. Cystoscopy was performed to check the urethra and bladder.  MICRODOSIMETRY: At the end of the procedure, the patient was emitting 0.06 mR/hr at 1 meter. Accordingly, he was considered safe for hospital discharge.  PLAN: The patient will return to the radiation oncology clinic for post implant CT dosimetry in three weeks.   ________________________________  Sheral Apley Tammi Klippel, M.D.

## 2019-06-18 ENCOUNTER — Encounter (HOSPITAL_BASED_OUTPATIENT_CLINIC_OR_DEPARTMENT_OTHER): Payer: Self-pay | Admitting: Urology

## 2019-06-19 ENCOUNTER — Telehealth: Payer: Self-pay | Admitting: *Deleted

## 2019-06-19 ENCOUNTER — Telehealth: Payer: Self-pay | Admitting: Radiation Oncology

## 2019-06-19 NOTE — Telephone Encounter (Signed)
Received voicemail message from patient's wife, Tanner Griffin, requesting a return call. Phoned back promptly. Tanner Griffin states, "we need some help he is peeing all the time and everywhere." She endorses the patient had urinary urgency and leakage prior to seed implant but confirms "that was nothing like it is now." She reports he is unable to control his urine and up to the bathroom all day and night. She denies that he complaints of dysuria or hematuria. She reports he was taking tamsulosin bid but went down to one tablet one day ago. She states, "we don't think that medication makes a difference." She denies the patient has any bowel complaints. She denies he has a fever. Tanner Griffin understands I will inform the provider of my findings and phone her back with further directions.   Shared patient with Dr. Karsten Ro. Had seed implant on 06/17/2019. Prostate volume: 41.3. Had spaceoar implanted. 76 seeds were placed.

## 2019-06-19 NOTE — Telephone Encounter (Signed)
Please call and advise patient to call for follow up visit with one of Ottelin's PA/NPs at [facility] to check PVR (post void residual ultrasound of bladder) to r/o overflow incontinence post-op. If they confirm that he is emptying his bladder well, they could then prescribe an anticholinergic to help with urgency/frequency but I would be afraid to do that without knowing that he is emptying his bladder well because that could potentially exacerbate the problem. -Mikki Ziff

## 2019-06-19 NOTE — Telephone Encounter (Signed)
CALLED PATIENT TO INFORM OF POST SEED APPTS. AND MRI FOR 07-05-19, SPOKE WITH PATIENT'S WIFE- MARY AND SHE IS AWARE OF THESE APPTS.

## 2019-06-19 NOTE — Telephone Encounter (Signed)
XXXX 

## 2019-06-20 ENCOUNTER — Telehealth: Payer: Self-pay

## 2019-06-20 NOTE — Telephone Encounter (Signed)
Spoke with Tanner Griffin patient's wife she will schedule an appointment with [facility] Urologist for his urinary issues.  Brandt Loosen RN

## 2019-07-04 ENCOUNTER — Telehealth: Payer: Self-pay | Admitting: *Deleted

## 2019-07-04 NOTE — Telephone Encounter (Signed)
CALLED PATIENT TO REMIND OF POST SEEDS APPTS. AND MRI FOR 07-05-19, SPOKE WITH PATIENT'S WIFE- MARY AND SHE IS AWARE OF THESE APPTS.

## 2019-07-05 ENCOUNTER — Other Ambulatory Visit: Payer: Self-pay

## 2019-07-05 ENCOUNTER — Ambulatory Visit
Admission: RE | Admit: 2019-07-05 | Discharge: 2019-07-05 | Disposition: A | Discharge status: Home / Self Care | Payer: Medicare Other | Source: Ambulatory Visit | Attending: Urology | Admitting: Urology

## 2019-07-05 ENCOUNTER — Ambulatory Visit (HOSPITAL_COMMUNITY): Admission: RE | Admit: 2019-07-05 | Payer: Medicare Other | Source: Ambulatory Visit

## 2019-07-05 ENCOUNTER — Ambulatory Visit
Admission: RE | Admit: 2019-07-05 | Discharge: 2019-07-05 | Disposition: A | Discharge status: Home / Self Care | Payer: Medicare Other | Source: Ambulatory Visit | Attending: Radiation Oncology | Admitting: Radiation Oncology

## 2019-07-05 ENCOUNTER — Encounter: Payer: Self-pay | Admitting: Urology

## 2019-07-05 VITALS — Wt 179.0 lb

## 2019-07-05 DIAGNOSIS — C61 Malignant neoplasm of prostate: Secondary | ICD-10-CM | POA: Diagnosis not present

## 2019-07-05 NOTE — Progress Notes (Signed)
Radiation Oncology         [phone]) [phone] ________________________________  Name: Tanner Griffin MRN: [id number]  Date: 07/05/2019  DOB: 1941-09-21  Post-Seed Follow-Up Visit Note  CC: Libby Maw, MD  [name], MD  Diagnosis:   78 y.o. gentleman with Stage T1c adenocarcinoma of the prostate with Gleason score of 3+4, and PSA of 5.96.    ICD-10-CM   1. Malignant neoplasm of prostate ([facility])  C61     Interval Since Last Radiation:  3 weeks 06/15/19:  Insertion of radioactive I-125 seeds into the prostate gland; 145 Gy, definitive therapy with placement of SpaceOAR gel.  Narrative:  The patient returns today for routine follow-up.  He is complaining of increased urinary frequency, urgency, nocturia x 4-5/night, hesitancy, intermittency and weak stream despite taking Flomax BID. He has occasional small volume incontinence en route to the bathroom due to urgency. He filled out a questionnaire regarding urinary function today providing and overall IPSS score of 13 characterizing his symptoms as moderate.  He reports voiding a fair amount of urine each time but does not feel that he is completely emptying his bladder on voiding, particularly at night. He specifically denies dysuria or gross hematuria.  His pre-implant score was 15 and he has previously failed a trial of Myrbetriq for his irritative LUTS. He denies any abdominal pain or bowel symptoms.  He reports that he feels well in general and has not noted any significant fatigue or decreased appetite.  He is maintaining his weight and denies recent fevers, chills or night sweats.  ALLERGIES:  is allergic to telmisartan.  Meds: Current Outpatient Medications  Medication Sig Dispense Refill  . amLODipine (NORVASC) 10 MG tablet TAKE 1 TABLET BY MOUTH  DAILY 90 tablet 1  . amLODipine (NORVASC) 10 MG tablet     . aspirin EC 81 MG tablet Take 81 mg by mouth daily.    [name] atorvastatin (LIPITOR) 40 MG tablet TAKE 1 TABLET BY MOUTH   DAILY 90 tablet 1  . atorvastatin (LIPITOR) 40 MG tablet     . HYDROcodone-acetaminophen (NORCO) 10-325 MG tablet Take 1-2 tablets by mouth every 4 (four) hours as needed for moderate pain. Maximum dose per 24 hours - 8 pills 10 tablet 0  . levofloxacin (LEVAQUIN) 750 MG tablet     . meloxicam (MOBIC) 7.5 MG tablet Take 1 tablet (7.5 mg total) by mouth daily. 30 tablet 5  . polyethylene glycol powder (GLYCOLAX/MIRALAX) powder Take 17 g by mouth 2 (two) times daily as needed. 3350 g 1  . tamsulosin (FLOMAX) 0.4 MG CAPS capsule TAKE 2 CAPSULES BY MOUTH  DAILY AFTER SUPPER. 180 capsule 1   No current facility-administered medications for this encounter.     Physical Findings: In general this is a well appearing African American male in no acute distress. He's alert and oriented x4 and appropriate throughout the examination. Cardiopulmonary assessment is negative for acute distress and he exhibits normal effort.   Lab Findings: Lab Results  Component Value Date   WBC 4.8 06/12/2019   HGB 12.4 (L) 06/12/2019   HCT 38.4 (L) 06/12/2019   MCV 102.1 (H) 06/12/2019   PLT 248 06/12/2019    Radiographic Findings:  Patient underwent CT imaging in our clinic for post implant dosimetry. The CT will be reviewed by Dr. Tammi Klippel to confirm there is an adequate distribution of radioactive seeds throughout the prostate gland and ensure that there are no seeds in or near the rectum. His scheduled  for prostate MRI today at noon and those images will be fused with his CT images for further evaluation. We suspect the final radiation plan and dosimetry will show appropriate coverage of the prostate gland. He understands that we will call and inform him of any unexpected findings on further review of his imaging and dosimetry.  Impression/Plan: 78 y.o. gentleman with Stage T1c adenocarcinoma of the prostate with Gleason score of 3+4, and PSA of 5.96. The patient is recovering from the effects of radiation. His  urinary symptoms should gradually improve over the next 4-6 months. We talked about this today. He is encouraged by his improvement already and is otherwise pleased with his outcome. We also talked about long-term follow-up for prostate cancer following seed implant. He understands that ongoing PSA determinations and digital rectal exams will help perform surveillance to rule out disease recurrence. He has a follow up appointment scheduled with Dr. Karsten Ro on 07/06/19. He understands what to expect with his PSA measures. Patient was also educated today about some of the long-term effects from radiation including a small risk for rectal bleeding and possibly erectile dysfunction. We talked about some of the general management approaches to these potential complications. However, I did encourage the patient to contact our office or return at any point if he has questions or concerns related to his previous radiation and prostate cancer.    Nicholos Johns, PA-C

## 2019-07-06 ENCOUNTER — Telehealth: Payer: Self-pay | Admitting: *Deleted

## 2019-07-06 NOTE — Telephone Encounter (Signed)
CALLED PATIENT TO INFORM THAT MRI HAS BEEN RESCHEDULED FOR 07-11-19 - ARRIVAL TIME- 11:30 AM @ [facility], NO RESTRICTIONS TO TEST, SPOKE WITH PATIENT'S WIFE AND SHE IS AWARE OF THIS TEST

## 2019-07-08 NOTE — Addendum Note (Signed)
Encounter addended by: Tyler Pita, MD on: 07/08/2019 1:44 PM  Actions taken: Medication List reviewed, Problem List reviewed, Allergies reviewed, Clinical Note Signed

## 2019-07-08 NOTE — Progress Notes (Signed)
  Radiation Oncology         [phone] ________________________________  Name: Tanner Griffin MRN: [id number]  Date: 07/05/2019  DOB: 01/29/1941  COMPLEX SIMULATION NOTE  NARRATIVE:  The patient was brought to the [facility] today following prostate seed implantation approximately one month ago.  Identity was confirmed.  All relevant records and images related to the planned course of therapy were reviewed.  Then, the patient was set-up supine.  CT images were obtained.  The CT images were loaded into the planning software.  Then the prostate and rectum were contoured.  Treatment planning then occurred.  The implanted iodine 125 seeds were identified by the physics staff for projection of radiation distribution  I have requested : 3D Simulation  I have requested a DVH of the following structures: Prostate and rectum.    ________________________________  Sheral Apley Tammi Klippel, M.D.

## 2019-07-11 ENCOUNTER — Ambulatory Visit (HOSPITAL_COMMUNITY): Admission: RE | Admit: 2019-07-11 | Payer: Medicare Other | Source: Ambulatory Visit

## 2019-07-12 ENCOUNTER — Ambulatory Visit (HOSPITAL_COMMUNITY)
Admission: RE | Admit: 2019-07-12 | Discharge: 2019-07-12 | Disposition: A | Discharge status: Home / Self Care | Payer: Medicare Other | Source: Ambulatory Visit | Attending: Urology | Admitting: Urology

## 2019-07-12 ENCOUNTER — Other Ambulatory Visit: Payer: Self-pay

## 2019-07-12 DIAGNOSIS — C61 Malignant neoplasm of prostate: Secondary | ICD-10-CM

## 2019-07-26 ENCOUNTER — Encounter: Payer: Self-pay | Admitting: Radiation Oncology

## 2019-07-26 ENCOUNTER — Ambulatory Visit
Admission: RE | Admit: 2019-07-26 | Discharge: 2019-07-26 | Disposition: A | Discharge status: Home / Self Care | Payer: Medicare Other | Source: Ambulatory Visit | Attending: Radiation Oncology | Admitting: Radiation Oncology

## 2019-07-26 DIAGNOSIS — C61 Malignant neoplasm of prostate: Secondary | ICD-10-CM | POA: Insufficient documentation

## 2019-07-26 DIAGNOSIS — Z510 Encounter for antineoplastic radiation therapy: Secondary | ICD-10-CM | POA: Diagnosis not present

## 2019-08-12 NOTE — Progress Notes (Signed)
  Radiation Oncology         [phone] ________________________________  Name: Tanner Griffin MRN: [id number]  Date: 07/26/2019  DOB: [birth year]  3D Planning Note   Prostate Brachytherapy Post-Implant Dosimetry  Diagnosis: 78 y.o. gentleman with Stage T1c adenocarcinoma of the prostate with Gleason score of 3+4, and PSA of 5.96  Narrative: On a previous date, Tanner Griffin returned following prostate seed implantation for post implant planning. He underwent CT scan complex simulation to delineate the three-dimensional structures of the pelvis and demonstrate the radiation distribution.  Since that time, the seed localization, and complex isodose planning with dose volume histograms have now been completed.  Results:   Prostate Coverage - The dose of radiation delivered to the 90% or more of the prostate gland (D90) was 109.47% of the prescription dose. This exceeds our goal of greater than 90%. Rectal Sparing - The volume of rectal tissue receiving the prescription dose or higher was 0.05 cc. This falls under our thresholds tolerance of 1.0 cc.  Impression: The prostate seed implant appears to show adequate target coverage and appropriate rectal sparing.  Plan:  The patient will continue to follow with urology for ongoing PSA determinations. I would anticipate a high likelihood for local tumor control with minimal risk for rectal morbidity.  ________________________________  Sheral Apley Tammi Klippel, M.D.

## 2019-08-14 ENCOUNTER — Other Ambulatory Visit: Payer: Self-pay | Admitting: Family Medicine

## 2019-08-14 DIAGNOSIS — R350 Frequency of micturition: Secondary | ICD-10-CM

## 2019-09-27 ENCOUNTER — Ambulatory Visit (INDEPENDENT_AMBULATORY_CARE_PROVIDER_SITE_OTHER): Payer: Medicare Other

## 2019-09-27 ENCOUNTER — Other Ambulatory Visit: Payer: Self-pay

## 2019-09-27 ENCOUNTER — Encounter: Payer: Self-pay | Admitting: Family Medicine

## 2019-09-27 DIAGNOSIS — Z23 Encounter for immunization: Secondary | ICD-10-CM | POA: Diagnosis not present

## 2019-09-27 NOTE — Progress Notes (Signed)
Pt came into the office today for high dose flu shot, injection given into left arm and info was given to the pt. Pt tolerated well.

## 2019-11-29 ENCOUNTER — Encounter: Payer: Self-pay | Admitting: Family Medicine

## 2019-11-29 ENCOUNTER — Ambulatory Visit (INDEPENDENT_AMBULATORY_CARE_PROVIDER_SITE_OTHER): Payer: Medicare Other | Admitting: Family Medicine

## 2019-11-29 ENCOUNTER — Other Ambulatory Visit: Payer: Self-pay

## 2019-11-29 ENCOUNTER — Ambulatory Visit (INDEPENDENT_AMBULATORY_CARE_PROVIDER_SITE_OTHER): Payer: Medicare Other

## 2019-11-29 VITALS — BP 118/82 | HR 80 | Temp 98.0°F | Ht 68.0 in | Wt 170.2 lb

## 2019-11-29 DIAGNOSIS — M545 Low back pain, unspecified: Secondary | ICD-10-CM

## 2019-11-29 DIAGNOSIS — E785 Hyperlipidemia, unspecified: Secondary | ICD-10-CM | POA: Diagnosis not present

## 2019-11-29 DIAGNOSIS — C61 Malignant neoplasm of prostate: Secondary | ICD-10-CM

## 2019-11-29 DIAGNOSIS — M1612 Unilateral primary osteoarthritis, left hip: Secondary | ICD-10-CM | POA: Diagnosis not present

## 2019-11-29 DIAGNOSIS — I10 Essential (primary) hypertension: Secondary | ICD-10-CM

## 2019-11-29 LAB — CBC
HCT: 38.4 % — ABNORMAL LOW (ref 39.0–52.0)
Hemoglobin: 12.6 g/dL — ABNORMAL LOW (ref 13.0–17.0)
MCHC: 32.8 g/dL (ref 30.0–36.0)
MCV: 98 fl (ref 78.0–100.0)
Platelets: 243 10*3/uL (ref 150.0–400.0)
RBC: 3.92 Mil/uL — ABNORMAL LOW (ref 4.22–5.81)
RDW: 13.7 % (ref 11.5–15.5)
WBC: 3.9 10*3/uL — ABNORMAL LOW (ref 4.0–10.5)

## 2019-11-29 LAB — LDL CHOLESTEROL, DIRECT: Direct LDL: 84 mg/dL

## 2019-11-29 LAB — COMPREHENSIVE METABOLIC PANEL
ALT: 17 U/L (ref 0–53)
AST: 17 U/L (ref 0–37)
Albumin: 4.1 g/dL (ref 3.5–5.2)
Alkaline Phosphatase: 143 U/L — ABNORMAL HIGH (ref 39–117)
BUN: 20 mg/dL (ref 6–23)
CO2: 26 mEq/L (ref 19–32)
Calcium: 9.8 mg/dL (ref 8.4–10.5)
Chloride: 108 mEq/L (ref 96–112)
Creatinine, Ser: 0.89 mg/dL (ref 0.40–1.50)
GFR: 100.38 mL/min (ref >60.00)
Glucose, Bld: 79 mg/dL (ref 70–99)
Potassium: 3.9 mEq/L (ref 3.5–5.1)
Sodium: 141 mEq/L (ref 135–145)
Total Bilirubin: 0.8 mg/dL (ref 0.2–1.2)
Total Protein: 7.2 g/dL (ref 6.0–8.3)

## 2019-11-29 MED ORDER — METHOCARBAMOL 500 MG PO TABS: 500.0000 mg | ORAL_TABLET | Freq: Three times a day (TID) | ORAL | 1 refills | Status: DC | PRN

## 2019-11-29 MED ORDER — MELOXICAM 7.5 MG PO TABS: 7.5000 mg | ORAL_TABLET | Freq: Every day | ORAL | 0 refills | Status: DC

## 2019-11-29 NOTE — Addendum Note (Signed)
Addended by: Lynnea Ferrier on: 11/29/2019 03:17 PM   Modules accepted: Orders

## 2019-11-29 NOTE — Progress Notes (Addendum)
Established Patient Office Visit  Subjective:  Patient ID: Tanner Griffin, male    DOB: Sep 12, 1941  Age: 78 y.o. MRN: [id number]  CC:  Chief Complaint  Patient presents with  . Hip Pain    left hip extending to butt, pain started yesterday     HPI Tanner Griffin presents for evaluation treatment of a 1 day history of left lower back pain.  Denies recent injury.  This occurred after he had been mowing the leaves in his yard and his neighbors yard.  No history of back injury.  Currently under treatment for prostate cancer with radioactive seeding.  Lost to follow-up for hypertension and elevated cholesterol.  Past Medical History:  Diagnosis Date  . Arthritis   . Arthrofibrosis of total knee arthroplasty, right 03/26/2014  . Constipation    takes Miralax daily as needed  . Headache    occasional  . HYPERLIPIDEMIA    takes Lipitor daily  . HYPERTENSION    takes Amlodipine and Labetalol daily  . Joint pain   . Joint swelling   . Nocturia   . Osteoarthritis of left knee 03/26/2014  . Pneumonia    hx of-as a child  . Prostate cancer ([place])   . Urinary frequency    takes Flomax daily  . Wears eyeglasses   . Wears partial dentures    top     Past Surgical History:  Procedure Laterality Date  . COLONOSCOPY    . HERNIA REPAIR     inguinal 04/14/17  . INGUINAL HERNIA REPAIR Right 04/14/2017   Procedure: RIGHT INGUINAL HERNIA REPAIR WITH MESH;  Surgeon: Armandina Gemma, MD;  Location: [facility];  Service: General;  Laterality: Right;  . INSERTION OF MESH Right 04/14/2017   Procedure: INSERTION OF MESH;  Surgeon: Armandina Gemma, MD;  Location: [facility];  Service: General;  Laterality: Right;  . KNEE CLOSED REDUCTION Right 03/26/2014   Procedure: CLOSED MANIPULATION KNEE;  Surgeon: [name], MD;  Location: [place];  Service: Orthopedics;  Laterality: Right;  . RADIOACTIVE SEED IMPLANT N/A 06/15/2019   Procedure: RADIOACTIVE SEED IMPLANT/BRACHYTHERAPY IMPLANT;  Surgeon: [name], MD;   Location: [facility];  Service: Urology;  Laterality: N/A;  . SPACE OAR INSTILLATION N/A 06/15/2019   Procedure: SPACE OAR INSTILLATION;  Surgeon: [name], MD;  Location: [facility];  Service: Urology;  Laterality: N/A;  . TOTAL KNEE ARTHROPLASTY  2011   right, Dr. [name] per pt  . TOTAL KNEE ARTHROPLASTY Left 03/26/2014   DR LANDAU  . TOTAL KNEE ARTHROPLASTY Left 03/26/2014   Procedure: TOTAL KNEE ARTHROPLASTY;  Surgeon: [name], MD;  Location: [place];  Service: Orthopedics;  Laterality: Left;    Family History  Problem Relation Age of Onset  . Brain cancer Father   . Renal Disease Brother        ESRD, unknown cause  . Stomach cancer Neg Hx   . Esophageal cancer Neg Hx   . Rectal cancer Neg Hx   . Breast cancer Neg Hx   . Colon cancer Neg Hx   . Pancreatic cancer Neg Hx     Social History   Socioeconomic History  . Marital status: Married    Spouse name: Mary  . Number of children: 5  . Years of education: 9th  . Highest education level: Not on file  Occupational History  . Occupation: retired    Comment: Architect  Tobacco Use  . Smoking status: Never  Smoker  . Smokeless tobacco: Never Used  Substance and Sexual Activity  . Alcohol use: No    Alcohol/week: 0.0 standard drinks  . Drug use: No  . Sexual activity: Not Currently  Other Topics Concern  . Not on file  Social History Narrative   Married, together 13 years in 2016. 1 son, 2 daughters. Can't count # grandkids at least 10, 11. 1 great-granddaughter.   Resides in [place], on [place].   Grandson and daughter live with them.   Retired from Architect work.   Hobbies: time with grand kids-play basketball.   Social Determinants of Health   Financial Resource Strain:   . Difficulty of Paying Living Expenses: Not on file  Food Insecurity:   . Worried About Charity fundraiser in the Last Year: Not on file  . Ran Out of Food in the Last Year: Not on  file  Transportation Needs:   . Lack of Transportation (Medical): Not on file  . Lack of Transportation (Non-Medical): Not on file  Physical Activity:   . Days of Exercise per Week: Not on file  . Minutes of Exercise per Session: Not on file  Stress:   . Feeling of Stress : Not on file  Social Connections:   . Frequency of Communication with Friends and Family: Not on file  . Frequency of Social Gatherings with Friends and Family: Not on file  . Attends Religious Services: Not on file  . Active Member of Clubs or Organizations: Not on file  . Attends Archivist Meetings: Not on file  . Marital Status: Not on file  Intimate Partner Violence:   . Fear of Current or Ex-Partner: Not on file  . Emotionally Abused: Not on file  . Physically Abused: Not on file  . Sexually Abused: Not on file    Outpatient Medications Prior to Visit  Medication Sig Dispense Refill  . amLODipine (NORVASC) 10 MG tablet TAKE 1 TABLET BY MOUTH  DAILY 90 tablet 1  . aspirin EC 81 MG tablet Take 81 mg by mouth daily.    [name] atorvastatin (LIPITOR) 40 MG tablet TAKE 1 TABLET BY MOUTH  DAILY 90 tablet 1  . atorvastatin (LIPITOR) 40 MG tablet TAKE 1 TABLET BY MOUTH  DAILY 90 tablet 3  . tamsulosin (FLOMAX) 0.4 MG CAPS capsule TAKE 2 CAPSULES BY MOUTH  DAILY AFTER SUPPER 180 capsule 3  . HYDROcodone-acetaminophen (NORCO) 10-325 MG tablet Take 1-2 tablets by mouth every 4 (four) hours as needed for moderate pain. Maximum dose per 24 hours - 8 pills (Patient not taking: Reported on 11/29/2019) 10 tablet 0  . polyethylene glycol powder (GLYCOLAX/MIRALAX) powder Take 17 g by mouth 2 (two) times daily as needed. (Patient not taking: Reported on 11/29/2019) 3350 g 1  . amLODipine (NORVASC) 10 MG tablet     . amLODipine (NORVASC) 10 MG tablet TAKE 1 TABLET BY MOUTH  DAILY 90 tablet 3  . atorvastatin (LIPITOR) 40 MG tablet     . levofloxacin (LEVAQUIN) 750 MG tablet     . meloxicam (MOBIC) 7.5 MG tablet Take 1  tablet (7.5 mg total) by mouth daily. (Patient not taking: Reported on 11/29/2019) 30 tablet 5   No facility-administered medications prior to visit.    Allergies  Allergen Reactions  . Telmisartan Other (See Comments)    Reaction:  Headache     ROS Review of Systems  Constitutional: Negative for diaphoresis, fatigue, fever and unexpected weight change.  HENT:  Negative.   Eyes: Negative for photophobia and visual disturbance.  Respiratory: Negative.   Cardiovascular: Negative.   Gastrointestinal: Negative.   Endocrine: Negative for polyphagia and polyuria.  Genitourinary: Positive for difficulty urinating and frequency. Negative for urgency.  Musculoskeletal: Positive for back pain and myalgias.  Skin: Negative for pallor and rash.  Allergic/Immunologic: Negative for immunocompromised state.  Neurological: Negative for weakness and numbness.  Hematological: Does not bruise/bleed easily.  Psychiatric/Behavioral: Negative.       Objective:    Physical Exam  Constitutional: He appears well-developed and well-nourished. No distress.  HENT:  Head: Normocephalic and atraumatic.  Right Ear: External ear normal.  Left Ear: External ear normal.  Eyes: Conjunctivae are normal. Right eye exhibits no discharge. Left eye exhibits no discharge. No scleral icterus.  Neck: No JVD present. No tracheal deviation present.  Cardiovascular: Normal rate, regular rhythm and normal heart sounds.  Pulmonary/Chest: Effort normal. No stridor. He has decreased breath sounds. He has no wheezes. He has no rhonchi. He has no rales.  Musculoskeletal:     Lumbar back: No tenderness or bony tenderness.     Right hip: Decreased range of motion.     Left hip: Decreased range of motion.       Legs:  Skin: He is not diaphoretic.    BP 118/82   Pulse 80   Temp 98 F (36.7 C)   Ht '5\' 8"'  (1.727 m)   Wt 170 lb 3.2 oz (77.2 kg)   SpO2 99%   BMI 25.88 kg/m  Wt Readings from Last 3 Encounters:    11/29/19 170 lb 3.2 oz (77.2 kg)  07/05/19 179 lb (81.2 kg)  06/15/19 166 lb 9.6 oz (75.6 kg)     Health Maintenance Due  Topic Date Due  . FOOT EXAM  10/16/2016  . OPHTHALMOLOGY EXAM  11/18/2016  . HEMOGLOBIN A1C  09/15/2018  . URINE MICROALBUMIN  03/16/2019  . TETANUS/TDAP  07/04/2019    There are no preventive care reminders to display for this patient.  Lab Results  Component Value Date   TSH 1.03 03/15/2018   Lab Results  Component Value Date   WBC 4.8 06/12/2019   HGB 12.4 (L) 06/12/2019   HCT 38.4 (L) 06/12/2019   MCV 102.1 (H) 06/12/2019   PLT 248 06/12/2019   Lab Results  Component Value Date   NA 146 (H) 06/12/2019   K 4.0 06/12/2019   CO2 25 06/12/2019   GLUCOSE 102 (H) 06/12/2019   BUN 18 06/12/2019   CREATININE 0.89 06/12/2019   BILITOT 1.1 06/12/2019   ALKPHOS 142 (H) 06/12/2019   AST 25 06/12/2019   ALT 19 06/12/2019   PROT 7.1 06/12/2019   ALBUMIN 4.1 06/12/2019   CALCIUM 9.4 06/12/2019   ANIONGAP 10 06/12/2019   GFR 119.49 03/15/2018   Lab Results  Component Value Date   CHOL 139 03/15/2018   Lab Results  Component Value Date   HDL 46.30 03/15/2018   Lab Results  Component Value Date   LDLCALC 77 03/15/2018   Lab Results  Component Value Date   TRIG 77.0 03/15/2018   Lab Results  Component Value Date   CHOLHDL 3 03/15/2018   Lab Results  Component Value Date   HGBA1C 5.7 03/15/2018      Assessment & Plan:   Problem List Items Addressed This Visit      Cardiovascular and Mediastinum   Essential hypertension - Primary   Relevant Orders   CBC  Comp Met (CMET)   Urinalysis, Routine w reflex microscopic     Genitourinary   Malignant neoplasm of prostate (HCC)     Other   Hyperlipidemia   Relevant Orders   Direct LDL   Acute left-sided low back pain without sciatica   Relevant Medications   methocarbamol (ROBAXIN) 500 MG tablet   meloxicam (MOBIC) 7.5 MG tablet   Other Relevant Orders   DG Hip Unilat W OR  W/O Pelvis 2-3 Views Left      Meds ordered this encounter  Medications  . methocarbamol (ROBAXIN) 500 MG tablet    Sig: Take 1 tablet (500 mg total) by mouth every 8 (eight) hours as needed for muscle spasms.    Dispense:  30 tablet    Refill:  1  . meloxicam (MOBIC) 7.5 MG tablet    Sig: Take 1 tablet (7.5 mg total) by mouth daily.    Dispense:  30 tablet    Refill:  0    Follow-up: Return in about 9 days (around 12/08/2019).   12/16 addendum: Paget's Disease   Libby Maw, MD

## 2019-12-10 ENCOUNTER — Encounter: Payer: Self-pay | Admitting: Family Medicine

## 2019-12-10 ENCOUNTER — Other Ambulatory Visit: Payer: Self-pay

## 2019-12-10 ENCOUNTER — Ambulatory Visit (INDEPENDENT_AMBULATORY_CARE_PROVIDER_SITE_OTHER): Payer: Medicare Other | Admitting: Family Medicine

## 2019-12-10 VITALS — BP 128/68 | HR 69 | Temp 96.2°F | Wt 171.4 lb

## 2019-12-10 DIAGNOSIS — M545 Low back pain, unspecified: Secondary | ICD-10-CM

## 2019-12-10 DIAGNOSIS — M889 Osteitis deformans of unspecified bone: Secondary | ICD-10-CM | POA: Diagnosis not present

## 2019-12-10 MED ORDER — ALENDRONATE SODIUM 70 MG PO TABS: 70.0000 mg | ORAL_TABLET | ORAL | 4 refills | Status: DC

## 2019-12-10 NOTE — Progress Notes (Signed)
Established Patient Office Visit  Subjective:  Patient ID: Tanner Griffin, male    DOB: [birth year]  Age: 78 y.o. MRN: [id number]  CC:  Chief Complaint  Patient presents with  . Follow-up    2 week follow up    HPI Tanner Griffin presents for follow-up of his left lower back pain that is been improved with meloxicam and Robaxin.  Also for discussion about Paget disease discovered in the right pelvis.  Patient is having no discomfort in this area.  Teeth are pending coaching.  He does have some partials in but the teeth are otherwise in good shape.  Past Medical History:  Diagnosis Date  . Arthritis   . Arthrofibrosis of total knee arthroplasty, right 03/26/2014  . Constipation    takes Miralax daily as needed  . Headache    occasional  . HYPERLIPIDEMIA    takes Lipitor daily  . HYPERTENSION    takes Amlodipine and Labetalol daily  . Joint pain   . Joint swelling   . Nocturia   . Osteoarthritis of left knee 03/26/2014  . Pneumonia    hx of-as a child  . Prostate cancer ([place])   . Urinary frequency    takes Flomax daily  . Wears eyeglasses   . Wears partial dentures    top     Past Surgical History:  Procedure Laterality Date  . COLONOSCOPY    . HERNIA REPAIR     inguinal 04/14/17  . INGUINAL HERNIA REPAIR Right 04/14/2017   Procedure: RIGHT INGUINAL HERNIA REPAIR WITH MESH;  Surgeon: Armandina Gemma, MD;  Location: [facility];  Service: General;  Laterality: Right;  . INSERTION OF MESH Right 04/14/2017   Procedure: INSERTION OF MESH;  Surgeon: Armandina Gemma, MD;  Location: [facility];  Service: General;  Laterality: Right;  . KNEE CLOSED REDUCTION Right 03/26/2014   Procedure: CLOSED MANIPULATION KNEE;  Surgeon: [name], MD;  Location: [place];  Service: Orthopedics;  Laterality: Right;  . RADIOACTIVE SEED IMPLANT N/A 06/15/2019   Procedure: RADIOACTIVE SEED IMPLANT/BRACHYTHERAPY IMPLANT;  Surgeon: [name], MD;  Location: [facility];  Service: Urology;   Laterality: N/A;  . SPACE OAR INSTILLATION N/A 06/15/2019   Procedure: SPACE OAR INSTILLATION;  Surgeon: [name], MD;  Location: [facility];  Service: Urology;  Laterality: N/A;  . TOTAL KNEE ARTHROPLASTY  2011   right, Dr. [name] per pt  . TOTAL KNEE ARTHROPLASTY Left 03/26/2014   DR LANDAU  . TOTAL KNEE ARTHROPLASTY Left 03/26/2014   Procedure: TOTAL KNEE ARTHROPLASTY;  Surgeon: [name], MD;  Location: [place];  Service: Orthopedics;  Laterality: Left;    Family History  Problem Relation Age of Onset  . Brain cancer Father   . Renal Disease Brother        ESRD, unknown cause  . Stomach cancer Neg Hx   . Esophageal cancer Neg Hx   . Rectal cancer Neg Hx   . Breast cancer Neg Hx   . Colon cancer Neg Hx   . Pancreatic cancer Neg Hx     Social History   Socioeconomic History  . Marital status: Married    Spouse name: Mary  . Number of children: 5  . Years of education: 9th  . Highest education level: Not on file  Occupational History  . Occupation: retired    Comment: Architect  Tobacco Use  . Smoking status: Never Smoker  . Smokeless tobacco: Never Used  Substance and Sexual Activity  . Alcohol use: No    Alcohol/week: 0.0 standard drinks  . Drug use: No  . Sexual activity: Not Currently  Other Topics Concern  . Not on file  Social History Narrative   Married, together 89 years in 2016. 1 son, 2 daughters. Can't count # grandkids at least 10, 11. 1 great-granddaughter.   Resides in [place], on [place].   Grandson and daughter live with them.   Retired from Architect work.   Hobbies: time with grand kids-play basketball.   Social Determinants of Health   Financial Resource Strain:   . Difficulty of Paying Living Expenses: Not on file  Food Insecurity:   . Worried About Charity fundraiser in the Last Year: Not on file  . Ran Out of Food in the Last Year: Not on file  Transportation Needs:   . Lack of Transportation  (Medical): Not on file  . Lack of Transportation (Non-Medical): Not on file  Physical Activity:   . Days of Exercise per Week: Not on file  . Minutes of Exercise per Session: Not on file  Stress:   . Feeling of Stress : Not on file  Social Connections:   . Frequency of Communication with Friends and Family: Not on file  . Frequency of Social Gatherings with Friends and Family: Not on file  . Attends Religious Services: Not on file  . Active Member of Clubs or Organizations: Not on file  . Attends Archivist Meetings: Not on file  . Marital Status: Not on file  Intimate Partner Violence:   . Fear of Current or Ex-Partner: Not on file  . Emotionally Abused: Not on file  . Physically Abused: Not on file  . Sexually Abused: Not on file    Outpatient Medications Prior to Visit  Medication Sig Dispense Refill  . amLODipine (NORVASC) 10 MG tablet TAKE 1 TABLET BY MOUTH  DAILY 90 tablet 1  . aspirin EC 81 MG tablet Take 81 mg by mouth daily.    [name] atorvastatin (LIPITOR) 40 MG tablet TAKE 1 TABLET BY MOUTH  DAILY 90 tablet 1  . atorvastatin (LIPITOR) 40 MG tablet TAKE 1 TABLET BY MOUTH  DAILY 90 tablet 3  . meloxicam (MOBIC) 7.5 MG tablet Take 1 tablet (7.5 mg total) by mouth daily. 30 tablet 0  . methocarbamol (ROBAXIN) 500 MG tablet Take 1 tablet (500 mg total) by mouth every 8 (eight) hours as needed for muscle spasms. 30 tablet 1  . polyethylene glycol powder (GLYCOLAX/MIRALAX) powder Take 17 g by mouth 2 (two) times daily as needed. 3350 g 1  . tamsulosin (FLOMAX) 0.4 MG CAPS capsule TAKE 2 CAPSULES BY MOUTH  DAILY AFTER SUPPER 180 capsule 3  . HYDROcodone-acetaminophen (NORCO) 10-325 MG tablet Take 1-2 tablets by mouth every 4 (four) hours as needed for moderate pain. Maximum dose per 24 hours - 8 pills (Patient not taking: Reported on 11/29/2019) 10 tablet 0   No facility-administered medications prior to visit.    Allergies  Allergen Reactions  . Telmisartan Other (See  Comments)    Reaction:  Headache     ROS Review of Systems  Constitutional: Negative.   Respiratory: Negative.   Cardiovascular: Negative.   Gastrointestinal: Negative.   Musculoskeletal: Positive for arthralgias, back pain and gait problem.  Allergic/Immunologic: Negative for immunocompromised state.  Neurological: Negative for syncope and speech difficulty.  Hematological: Does not bruise/bleed easily.  Psychiatric/Behavioral: Negative.  Objective:    Physical Exam  Constitutional: He is oriented to person, place, and time. He appears well-developed and well-nourished. No distress.  HENT:  Head: Normocephalic and atraumatic.  Right Ear: External ear normal.  Left Ear: External ear normal.  Eyes: Right eye exhibits no discharge. Left eye exhibits no discharge. No scleral icterus.  Pulmonary/Chest: Effort normal.  Neurological: He is alert and oriented to person, place, and time.  Skin: Skin is warm and dry. He is not diaphoretic.  Psychiatric: He has a normal mood and affect. His behavior is normal.    BP 128/68   Pulse 69   Temp (!) 96.2 F (35.7 C) (Tympanic)   Wt 171 lb 6.4 oz (77.7 kg)   SpO2 100%   BMI 26.06 kg/m  Wt Readings from Last 3 Encounters:  12/10/19 171 lb 6.4 oz (77.7 kg)  11/29/19 170 lb 3.2 oz (77.2 kg)  07/05/19 179 lb (81.2 kg)     Health Maintenance Due  Topic Date Due  . FOOT EXAM  10/16/2016  . OPHTHALMOLOGY EXAM  11/18/2016  . HEMOGLOBIN A1C  09/15/2018  . URINE MICROALBUMIN  03/16/2019  . TETANUS/TDAP  07/04/2019    There are no preventive care reminders to display for this patient.  Lab Results  Component Value Date   TSH 1.03 03/15/2018   Lab Results  Component Value Date   WBC 3.9 (L) 11/29/2019   HGB 12.6 (L) 11/29/2019   HCT 38.4 (L) 11/29/2019   MCV 98.0 11/29/2019   PLT 243.0 11/29/2019   Lab Results  Component Value Date   NA 141 11/29/2019   K 3.9 11/29/2019   CO2 26 11/29/2019   GLUCOSE 79 11/29/2019     BUN 20 11/29/2019   CREATININE 0.89 11/29/2019   BILITOT 0.8 11/29/2019   ALKPHOS 143 (H) 11/29/2019   AST 17 11/29/2019   ALT 17 11/29/2019   PROT 7.2 11/29/2019   ALBUMIN 4.1 11/29/2019   CALCIUM 9.8 11/29/2019   ANIONGAP 10 06/12/2019   GFR 100.38 11/29/2019   Lab Results  Component Value Date   CHOL 139 03/15/2018   Lab Results  Component Value Date   HDL 46.30 03/15/2018   Lab Results  Component Value Date   LDLCALC 77 03/15/2018   Lab Results  Component Value Date   TRIG 77.0 03/15/2018   Lab Results  Component Value Date   CHOLHDL 3 03/15/2018   Lab Results  Component Value Date   HGBA1C 5.7 03/15/2018      Assessment & Plan:   Problem List Items Addressed This Visit      Musculoskeletal and Integument   Paget's disease of the bone - Primary   Relevant Medications   alendronate (FOSAMAX) 70 MG tablet     Other   Acute left-sided low back pain without sciatica      Meds ordered this encounter  Medications  . alendronate (FOSAMAX) 70 MG tablet    Sig: Take 1 tablet (70 mg total) by mouth every 7 (seven) days. Take with a full glass of water on an empty stomach.    Dispense:  4 tablet    Refill:  4    Follow-up: Return in about 3 months (around 03/09/2020).  Will start Fosamax weekly.  Follow alkaline phosphatase levels which hopefully will respond to treatment.  Spent 15 minutes discussing the Paget's disease in the treatment.  Patient agrees to move forward.  He was given information on Fosamax and Paget's disease.  Gwyndolyn Saxon  Krystal Clark, MD

## 2019-12-10 NOTE — Patient Instructions (Signed)
Paget's Disease of Bone Paget's disease is a condition that makes the bones grow faster than normal. Healthy bones rebuild themselves by breaking down old bone and replacing it with new bone tissue (bone turnover) on a regular basis. This process normally slows down with age. However, the process speeds up and becomes abnormal when you have Paget's disease. This may cause bones to:  Be larger and weaker than normal.  Have abnormal shapes (deformities).  Break more easily than healthy bones. Paget's disease may affect just a few bones, or it may affect bones all over the body. Bones in the arms, legs, spine, pelvis, and skull are often affected. What are the causes? The cause of this condition is not known. What increases the risk? This condition is more likely to develop in:  People who have a family history of the disease.  People who are 17 or older.  Males.  People of European descent. What are the signs or symptoms? Symptoms of this condition include:  Bone pain.  Neck pain.  Headache.  Joint pain or stiffness.  Tingling or numbness.  Legs that bend outward from the hips to the ankles (bowed legs).  Bones that break easily.  A feeling of warmth in areas of skin that are over the affected bone.  Loss of height.  Changes in the shape of the skull or other bones.  Hearing loss, if the skull bones are affected. Some people have no symptoms. How is this diagnosed? This condition may be diagnosed based on:  A physical exam.  Your medical history.  Blood and urine tests.  Imaging tests, such as X-rays and bone scans.  Removal and testing of a bone sample (bone biopsy). This is rarely needed. How is this treated? Treatment for this condition depends on your symptoms and which areas of your body are affected. If you have no symptoms, you may not need treatment. However, you may need treatment if the condition is causing symptoms, affecting your skull, or putting  you at risk for broken bones (fractures). The goals of treatment are to relieve bone pain and to prevent the condition from getting worse. Treatment may include:  Medicines for pain, such as NSAIDs.  Medicines that slow down abnormal bone turnover (bisphosphonates or calcitonin).  Calcium and vitamin D supplements.  Devices to help you move around (assistive devices or mobility aids), such as a cane, walker, or brace.  Surgery to treat problems that are caused by the disease. Surgery is rarely used. Follow these instructions at home: Medicines  Take over-the-counter and prescription medicines only as told by your health care provider.  Take calcium and vitamin D supplements as told by your health care provider.  If you are taking a bisphosphonate by mouth: ? Take the medicine with a large glass of water. ? Take it in the morning. ? Do not eat or drink anything for 30 minutes after taking the medicine. ? Stay upright for 30 minutes after taking the medicine. Avoid lying down during this time. Preventing falls and fractures   This condition may put you at greater risk for falls and fractures. To help prevent falls: ? Use mobility aids as needed, such as a cane or walker. ? Wear closed-toe shoes that fit well and support your feet. Wear shoes that have rubber soles or low heels. ? Remove clutter and tripping hazards from all walkways. ? Use good lighting in all rooms. Use a night-light to help you see during the night. ? Install  and use handrails on stairways and in the bathroom. ? Have your eyes checked regularly. If you wear glasses, wear them at all times.  Ask your health care provider what activities are safe for you. You may need to avoid activities that put you at risk for falls or injuries, such as contact sports. General instructions   Wear braces as told by your health care provider.  You may need to have regular blood tests to monitor your treatment.  Keep all  follow-up visits as told by your health care provider. This is important. Contact a health care provider if you:  Have bone pain or joint pain that gets worse.  Have hearing loss.  Have swelling in your legs or ankles.  Have abdominal pain.  Lose your appetite.  Are not able to have a bowel movement (constipated). Get help right away if you:  Think that you might have a broken bone.  Fall.  Have fatigue or weakness.  Have chest pain.  Have shortness of breath.  Have numbness or an inability to move. Summary  Paget's disease is a condition that makes the bones grow faster than normal. This causes bones to be larger and weaker than normal, have abnormal shapes (deformities), and break more easily than healthy bones.  This condition is more likely to develop in people who have a family history of the disease, are 26 or older, are male, or are of European descent.  The goals of treatment are to relieve bone pain and to prevent the condition from getting worse. Treatment may include pain medicine, medicines that slow down abnormal bone turnover (bisphosphonates or calcitonin), and in rare cases, surgery.  You may need to have regular blood tests to monitor your treatment. Keep all follow-up visits as told by your health care provider. This is important. This information is not intended to replace advice given to you by your health care provider. Make sure you discuss any questions you have with your health care provider. Document Released: 05/28/2002 Document Revised: 12/26/2017 Document Reviewed: 12/26/2017 Elsevier Patient Education  [place]. Alendronate weekly tablets What is this medicine? ALENDRONATE (a LEN droe nate) slows calcium loss from bones. It helps to make healthy bone and to slow bone loss in people with osteoporosis. It may be used to treat Paget's disease. This medicine may be used for other purposes; ask your health care provider or pharmacist if you  have questions. COMMON BRAND NAME(S): Fosamax What should I tell my health care provider before I take this medicine? They need to know if you have any of these conditions:  esophagus, stomach, or intestine problems, like acid-reflux or GERD  dental disease  kidney disease  low blood calcium  low vitamin D  problems swallowing  problems sitting or standing for 30 minutes  an unusual or allergic reaction to alendronate, other medicines, foods, dyes, or preservatives  pregnant or trying to get pregnant  breast-feeding How should I use this medicine? You must take this medicine exactly as directed or you will lower the amount of medicine you absorb into your body or you may cause yourself harm. Take your dose by mouth first thing in the morning, after you are up for the day. Do not eat or drink anything before you take this medicine. Swallow your medicine with a full glass (6 to 8 fluid ounces) of plain water. Do not take this tablet with any other drink. Do not chew or crush the tablet. After taking this medicine,  do not eat breakfast, drink, or take any medicines or vitamins for at least 30 minutes. Stand or sit up for at least 30 minutes after you take this medicine; do not lie down. Take this medicine on the same day every week. Do not take your medicine more often than directed. Talk to your pediatrician regarding the use of this medicine in children. Special care may be needed. Overdosage: If you think you have taken too much of this medicine contact a poison control center or emergency room at once. NOTE: This medicine is only for you. Do not share this medicine with others. What if I miss a dose? If you miss a dose, take the dose on the morning after you remember. Then take your next dose on your regular day of the week. Never take 2 tablets on the same day. Do not take double or extra doses. What may interact with this medicine?  aluminum  hydroxide  antacids  aspirin  calcium supplements  drugs for inflammation like ibuprofen, naproxen, and others  iron supplements  magnesium supplements  vitamins with minerals This list may not describe all possible interactions. Give your health care provider a list of all the medicines, herbs, non-prescription drugs, or dietary supplements you use. Also tell them if you smoke, drink alcohol, or use illegal drugs. Some items may interact with your medicine. What should I watch for while using this medicine? Visit your doctor or health care professional for regular checks ups. It may be some time before you see benefit from this medicine. Do not stop taking your medication except on your doctor's advice. Your doctor or health care professional may order blood tests and other tests to see how you are doing. You should make sure you get enough calcium and vitamin D while you are taking this medicine, unless your doctor tells you not to. Discuss the foods you eat and the vitamins you take with your health care professional. Some people who take this medicine have severe bone, joint, and/or muscle pain. This medicine may also increase your risk for a broken thigh bone. Tell your doctor right away if you have pain in your upper leg or groin. Tell your doctor if you have any pain that does not go away or that gets worse. This medicine can make you more sensitive to the sun. If you get a rash while taking this medicine, sunlight may cause the rash to get worse. Keep out of the sun. If you cannot avoid being in the sun, wear protective clothing and use sunscreen. Do not use sun lamps or tanning beds/booths. What side effects may I notice from receiving this medicine? Side effects that you should report to your doctor or health care professional as soon as possible:  allergic reactions like skin rash, itching or hives, swelling of the face, lips, or tongue  black or tarry stools  bone, muscle or  joint pain  changes in vision  chest pain  heartburn or stomach pain  jaw pain, especially after dental work  pain or trouble when swallowing  redness, blistering, peeling or loosening of the skin, including inside the mouth Side effects that usually do not require medical attention (report to your doctor or health care professional if they continue or are bothersome):  changes in taste  diarrhea or constipation  eye pain or itching  headache  nausea or vomiting  stomach gas or fullness This list may not describe all possible side effects. Call your doctor for medical  advice about side effects. You may report side effects to FDA at 1-800-FDA-1088. Where should I keep my medicine? Keep out of the reach of children. Store at room temperature of 15 and 30 degrees C (59 and 86 degrees F). Throw away any unused medicine after the expiration date. NOTE: This sheet is a summary. It may not cover all possible information. If you have questions about this medicine, talk to your doctor, pharmacist, or health care provider.  2020 Elsevier/Gold Standard (2011-10-21 09:02:42)

## 2020-03-31 ENCOUNTER — Ambulatory Visit (INDEPENDENT_AMBULATORY_CARE_PROVIDER_SITE_OTHER): Payer: Medicare Other | Admitting: Family Medicine

## 2020-03-31 ENCOUNTER — Other Ambulatory Visit: Payer: Self-pay

## 2020-03-31 ENCOUNTER — Encounter: Payer: Self-pay | Admitting: Family Medicine

## 2020-03-31 VITALS — BP 132/64 | HR 55 | Temp 98.1°F | Ht 68.0 in | Wt 164.6 lb

## 2020-03-31 DIAGNOSIS — M889 Osteitis deformans of unspecified bone: Secondary | ICD-10-CM | POA: Diagnosis not present

## 2020-03-31 DIAGNOSIS — D539 Nutritional anemia, unspecified: Secondary | ICD-10-CM

## 2020-03-31 DIAGNOSIS — M545 Low back pain, unspecified: Secondary | ICD-10-CM

## 2020-03-31 DIAGNOSIS — M25561 Pain in right knee: Secondary | ICD-10-CM

## 2020-03-31 DIAGNOSIS — C61 Malignant neoplasm of prostate: Secondary | ICD-10-CM

## 2020-03-31 DIAGNOSIS — I10 Essential (primary) hypertension: Secondary | ICD-10-CM

## 2020-03-31 MED ORDER — TRAMADOL HCL 50 MG PO TABS: 50.0000 mg | ORAL_TABLET | Freq: Two times a day (BID) | ORAL | 0 refills | Status: AC | PRN

## 2020-03-31 MED ORDER — ALENDRONATE SODIUM 70 MG PO TABS: 70.0000 mg | ORAL_TABLET | ORAL | 4 refills | Status: DC

## 2020-03-31 NOTE — Progress Notes (Signed)
Established Patient Office Visit  Subjective:  Patient ID: Tanner Griffin, male    DOB: 12/05/1941  Age: 79 y.o. MRN: [id number]  CC:  Chief Complaint  Patient presents with  . Leg Pain    both legs hurting x 2 -3 weeks, urine frequency becoming worse.     HPI Tanner Griffin presents for follow-up of his Paget's disease and hypertension.  He is complaining of right knee pain.  He has to use a walker where he feels as though he may fall.  Knee has bothered him since he had a knee replacement some years ago.  He has had difficulty following up status post brachytherapy for his prostate cancer.  He was never able to start Fosamax or his patches disease.  He tells me that if I send in another prescription he will take it.  Past Medical History:  Diagnosis Date  . Arthritis   . Arthrofibrosis of total knee arthroplasty, right 03/26/2014  . Constipation    takes Miralax daily as needed  . Headache    occasional  . HYPERLIPIDEMIA    takes Lipitor daily  . HYPERTENSION    takes Amlodipine and Labetalol daily  . Joint pain   . Joint swelling   . Nocturia   . Osteoarthritis of left knee 03/26/2014  . Pneumonia    hx of-as a child  . Prostate cancer ([place])   . Urinary frequency    takes Flomax daily  . Wears eyeglasses   . Wears partial dentures    top     Past Surgical History:  Procedure Laterality Date  . COLONOSCOPY    . HERNIA REPAIR     inguinal 04/14/17  . INGUINAL HERNIA REPAIR Right 04/14/2017   Procedure: RIGHT INGUINAL HERNIA REPAIR WITH MESH;  Surgeon: Armandina Gemma, MD;  Location: [facility];  Service: General;  Laterality: Right;  . INSERTION OF MESH Right 04/14/2017   Procedure: INSERTION OF MESH;  Surgeon: Armandina Gemma, MD;  Location: [facility];  Service: General;  Laterality: Right;  . KNEE CLOSED REDUCTION Right 03/26/2014   Procedure: CLOSED MANIPULATION KNEE;  Surgeon: [name], MD;  Location: [facility];  Service: Orthopedics;  Laterality: Right;  . RADIOACTIVE SEED  IMPLANT N/A 06/15/2019   Procedure: RADIOACTIVE SEED IMPLANT/BRACHYTHERAPY IMPLANT;  Surgeon: [name], MD;  Location: [facility];  Service: Urology;  Laterality: N/A;  . SPACE OAR INSTILLATION N/A 06/15/2019   Procedure: SPACE OAR INSTILLATION;  Surgeon: [name], MD;  Location: [facility];  Service: Urology;  Laterality: N/A;  . TOTAL KNEE ARTHROPLASTY  2011   right, Dr. [name] per pt  . TOTAL KNEE ARTHROPLASTY Left 03/26/2014   DR LANDAU  . TOTAL KNEE ARTHROPLASTY Left 03/26/2014   Procedure: TOTAL KNEE ARTHROPLASTY;  Surgeon: [name], MD;  Location: [facility];  Service: Orthopedics;  Laterality: Left;    Family History  Problem Relation Age of Onset  . Brain cancer Father   . Renal Disease Brother        ESRD, unknown cause  . Stomach cancer Neg Hx   . Esophageal cancer Neg Hx   . Rectal cancer Neg Hx   . Breast cancer Neg Hx   . Colon cancer Neg Hx   . Pancreatic cancer Neg Hx     Social History   Socioeconomic History  . Marital status: Married    Spouse name: Mary  . Number of children: 5  . Years of  education: 9th  . Highest education level: Not on file  Occupational History  . Occupation: retired    Comment: Architect  Tobacco Use  . Smoking status: Never Smoker  . Smokeless tobacco: Never Used  Substance and Sexual Activity  . Alcohol use: No    Alcohol/week: 0.0 standard drinks  . Drug use: No  . Sexual activity: Not Currently  Other Topics Concern  . Not on file  Social History Narrative   Married, together 29 years in 2016. 1 son, 2 daughters. Can't count # grandkids at least 10, 11. 1 great-granddaughter.   Resides in [place], on [address].   Grandson and daughter live with them.   Retired from Architect work.   Hobbies: time with grand kids-play basketball.   Social Determinants of Health   Financial Resource Strain:   . Difficulty of Paying Living Expenses:   Food Insecurity:   .  Worried About Charity fundraiser in the Last Year:   . Arboriculturist in the Last Year:   Transportation Needs:   . Film/video editor (Medical):   [name] Lack of Transportation (Non-Medical):   Physical Activity:   . Days of Exercise per Week:   . Minutes of Exercise per Session:   Stress:   . Feeling of Stress :   Social Connections:   . Frequency of Communication with Friends and Family:   . Frequency of Social Gatherings with Friends and Family:   . Attends Religious Services:   . Active Member of Clubs or Organizations:   . Attends Archivist Meetings:   [name] Marital Status:   Intimate Partner Violence:   . Fear of Current or Ex-Partner:   . Emotionally Abused:   [name] Physically Abused:   . Sexually Abused:     Outpatient Medications Prior to Visit  Medication Sig Dispense Refill  . amLODipine (NORVASC) 10 MG tablet TAKE 1 TABLET BY MOUTH  DAILY 90 tablet 1  . aspirin EC 81 MG tablet Take 81 mg by mouth daily.    [name] atorvastatin (LIPITOR) 40 MG tablet TAKE 1 TABLET BY MOUTH  DAILY 90 tablet 1  . atorvastatin (LIPITOR) 40 MG tablet TAKE 1 TABLET BY MOUTH  DAILY 90 tablet 3  . oxybutynin (DITROPAN XL) 15 MG 24 hr tablet Take 15 mg by mouth daily.    . polyethylene glycol powder (GLYCOLAX/MIRALAX) powder Take 17 g by mouth 2 (two) times daily as needed. 3350 g 1  . tamsulosin (FLOMAX) 0.4 MG CAPS capsule TAKE 2 CAPSULES BY MOUTH  DAILY AFTER SUPPER 180 capsule 3  . HYDROcodone-acetaminophen (NORCO) 10-325 MG tablet Take 1-2 tablets by mouth every 4 (four) hours as needed for moderate pain. Maximum dose per 24 hours - 8 pills (Patient not taking: Reported on 11/29/2019) 10 tablet 0  . meloxicam (MOBIC) 7.5 MG tablet Take 1 tablet (7.5 mg total) by mouth daily. (Patient not taking: Reported on 03/31/2020) 30 tablet 0  . methocarbamol (ROBAXIN) 500 MG tablet Take 1 tablet (500 mg total) by mouth every 8 (eight) hours as needed for muscle spasms. (Patient not taking: Reported on  03/31/2020) 30 tablet 1  . alendronate (FOSAMAX) 70 MG tablet Take 1 tablet (70 mg total) by mouth every 7 (seven) days. Take with a full glass of water on an empty stomach. (Patient not taking: Reported on 03/31/2020) 4 tablet 4   No facility-administered medications prior to visit.    Allergies  Allergen Reactions  .  Telmisartan Other (See Comments)    Reaction:  Headache     ROS Review of Systems  Constitutional: Negative.   Respiratory: Negative.   Cardiovascular: Negative.   Gastrointestinal: Negative.   Musculoskeletal: Positive for arthralgias and gait problem.  Neurological: Negative for light-headedness and headaches.  Psychiatric/Behavioral: Negative.       Objective:    Physical Exam  Constitutional: He is oriented to person, place, and time. He appears well-developed and well-nourished. No distress.  HENT:  Head: Normocephalic and atraumatic.  Right Ear: External ear normal.  Left Ear: External ear normal.  Eyes: Conjunctivae are normal. Right eye exhibits no discharge. Left eye exhibits no discharge. No scleral icterus.  Neck: No JVD present. No tracheal deviation present.  Cardiovascular: Normal rate, regular rhythm and normal heart sounds.  Pulmonary/Chest: Effort normal and breath sounds normal. No stridor.  Musculoskeletal:     Lumbar back: No tenderness or bony tenderness.       Back:     Right knee: Swelling and laceration (healed mitline surgical scar cw ho tkr. ) present. No effusion or ecchymosis. Decreased range of motion.  Neurological: He is alert and oriented to person, place, and time.  Skin: Skin is dry. He is not diaphoretic.  Psychiatric: He has a normal mood and affect. His behavior is normal.    BP 132/64   Pulse (!) 55   Temp 98.1 F (36.7 C) (Tympanic)   Ht 5\' 8"  (1.727 m)   Wt 164 lb 9.6 oz (74.7 kg)   SpO2 95%   BMI 25.03 kg/m  Wt Readings from Last 3 Encounters:  03/31/20 164 lb 9.6 oz (74.7 kg)  12/10/19 171 lb 6.4 oz (77.7  kg)  11/29/19 170 lb 3.2 oz (77.2 kg)     Health Maintenance Due  Topic Date Due  . FOOT EXAM  10/16/2016  . OPHTHALMOLOGY EXAM  11/18/2016  . HEMOGLOBIN A1C  09/15/2018  . URINE MICROALBUMIN  03/16/2019  . TETANUS/TDAP  07/04/2019    There are no preventive care reminders to display for this patient.  Lab Results  Component Value Date   TSH 1.03 03/15/2018   Lab Results  Component Value Date   WBC 3.9 (L) 11/29/2019   HGB 12.6 (L) 11/29/2019   HCT 38.4 (L) 11/29/2019   MCV 98.0 11/29/2019   PLT 243.0 11/29/2019   Lab Results  Component Value Date   NA 141 11/29/2019   K 3.9 11/29/2019   CO2 26 11/29/2019   GLUCOSE 79 11/29/2019   BUN 20 11/29/2019   CREATININE 0.89 11/29/2019   BILITOT 0.8 11/29/2019   ALKPHOS 143 (H) 11/29/2019   AST 17 11/29/2019   ALT 17 11/29/2019   PROT 7.2 11/29/2019   ALBUMIN 4.1 11/29/2019   CALCIUM 9.8 11/29/2019   ANIONGAP 10 06/12/2019   GFR 100.38 11/29/2019   Lab Results  Component Value Date   CHOL 139 03/15/2018   Lab Results  Component Value Date   HDL 46.30 03/15/2018   Lab Results  Component Value Date   LDLCALC 77 03/15/2018   Lab Results  Component Value Date   TRIG 77.0 03/15/2018   Lab Results  Component Value Date   CHOLHDL 3 03/15/2018   Lab Results  Component Value Date   HGBA1C 5.7 03/15/2018      Assessment & Plan:   Problem List Items Addressed This Visit      Cardiovascular and Mediastinum   Essential hypertension   Relevant Orders  Comprehensive metabolic panel     Musculoskeletal and Integument   Paget's disease of the bone - Primary   Relevant Medications   alendronate (FOSAMAX) 70 MG tablet   Other Relevant Orders   Comprehensive metabolic panel   CBC   Alkaline phosphatase, bone specific     Genitourinary   Malignant neoplasm of prostate ([place])   Relevant Orders   Ambulatory referral to Urology     Other   Macrocytic anemia   Relevant Orders   Vitamin B12   CBC      Other Visit Diagnoses    Right knee pain, unspecified chronicity       Relevant Medications   traMADol (ULTRAM) 50 MG tablet      Meds ordered this encounter  Medications  . alendronate (FOSAMAX) 70 MG tablet    Sig: Take 1 tablet (70 mg total) by mouth every 7 (seven) days. Take with a full glass of water on an empty stomach.    Dispense:  12 tablet    Refill:  4  . traMADol (ULTRAM) 50 MG tablet    Sig: Take 1 tablet (50 mg total) by mouth every 12 (twelve) hours as needed for up to 5 days.    Dispense:  60 tablet    Refill:  0    Follow-up: Return in about 3 months (around 06/30/2020).  Assures me that he will go ahead and start the Fosamax.  Tramadol to be used as needed.  Drowsy precautions given. Libby Maw, MD

## 2020-04-01 LAB — COMPREHENSIVE METABOLIC PANEL
ALT: 21 U/L (ref 0–53)
AST: 25 U/L (ref 0–37)
Albumin: 4.2 g/dL (ref 3.5–5.2)
Alkaline Phosphatase: 149 U/L — ABNORMAL HIGH (ref 39–117)
BUN: 15 mg/dL (ref 6–23)
CO2: 25 mEq/L (ref 19–32)
Calcium: 9.8 mg/dL (ref 8.4–10.5)
Chloride: 107 mEq/L (ref 96–112)
Creatinine, Ser: 0.8 mg/dL (ref 0.40–1.50)
GFR: 113.42 mL/min (ref >60.00)
Glucose, Bld: 89 mg/dL (ref 70–99)
Potassium: 4.2 mEq/L (ref 3.5–5.1)
Sodium: 140 mEq/L (ref 135–145)
Total Bilirubin: 1 mg/dL (ref 0.2–1.2)
Total Protein: 6.8 g/dL (ref 6.0–8.3)

## 2020-04-01 LAB — CBC
HCT: 37.7 % — ABNORMAL LOW (ref 39.0–52.0)
Hemoglobin: 12.5 g/dL — ABNORMAL LOW (ref 13.0–17.0)
MCHC: 33.1 g/dL (ref 30.0–36.0)
MCV: 98.8 fl (ref 78.0–100.0)
Platelets: 266 10*3/uL (ref 150.0–400.0)
RBC: 3.82 Mil/uL — ABNORMAL LOW (ref 4.22–5.81)
RDW: 13.4 % (ref 11.5–15.5)
WBC: 6 10*3/uL (ref 4.0–10.5)

## 2020-04-01 LAB — VITAMIN B12: Vitamin B-12: 856 pg/mL (ref 211–911)

## 2020-04-03 LAB — ALKALINE PHOSPHATASE, BONE SPECIFIC: Tandem-R Ostase: 36.6 ug/L — ABNORMAL HIGH (ref 7.6–24.4)

## 2020-04-14 ENCOUNTER — Telehealth: Payer: Self-pay | Admitting: Family Medicine

## 2020-04-14 NOTE — Telephone Encounter (Signed)
Patient spouse is calling and wanted to speak to someone regarding medication. CB is [phone]

## 2020-04-14 NOTE — Telephone Encounter (Signed)
Spoke with patients wife who was calling to see if any medications were sent to the pharmacy for patient. Mrs. Riechers verbally understood medication was sent via mail order if they do not receive any within the next few days give [place] a or them a call. Wife agrees and will do so.

## 2020-04-16 ENCOUNTER — Telehealth: Payer: Self-pay | Admitting: Family Medicine

## 2020-04-16 NOTE — Progress Notes (Signed)
  Chronic Care Management   Note  04/16/2020 Name: Tanner Griffin MRN: [id number] DOB: 1941-06-24  Tanner Griffin is a 79 y.o. year old male who is a primary care patient of Giorgi, [place], MD. I reached out to [name] by phone today in response to a referral sent by Tanner Griffin's PCP, Libby Maw, MD.   Tanner Griffin was given information about Chronic Care Management services today including:  1. CCM service includes personalized support from designated clinical staff supervised by his physician, including individualized plan of care and coordination with other care providers 2. 24/7 contact phone numbers for assistance for urgent and routine care needs. 3. Service will only be billed when office clinical staff spend 20 minutes or more in a month to coordinate care. 4. Only one practitioner may furnish and bill the service in a calendar month. 5. The patient may stop CCM services at any time (effective at the end of the month) by phone call to the office staff.   Patient agreed to services and verbal consent obtained.   This note is not being shared with the patient for the following reason: To respect privacy (The patient or proxy has requested that the information not be shared).  Follow up plan:   Earney Hamburg Upstream Scheduler

## 2020-04-23 ENCOUNTER — Emergency Department (HOSPITAL_COMMUNITY)
Admission: EM | Admit: 2020-04-23 | Discharge: 2020-04-23 | Disposition: A | Discharge status: Home / Self Care | Payer: Medicare Other | Attending: Emergency Medicine | Admitting: Emergency Medicine

## 2020-04-23 DIAGNOSIS — I10 Essential (primary) hypertension: Secondary | ICD-10-CM | POA: Diagnosis not present

## 2020-04-23 DIAGNOSIS — R531 Weakness: Secondary | ICD-10-CM | POA: Diagnosis not present

## 2020-04-23 DIAGNOSIS — M6281 Muscle weakness (generalized): Secondary | ICD-10-CM | POA: Insufficient documentation

## 2020-04-23 DIAGNOSIS — Z743 Need for continuous supervision: Secondary | ICD-10-CM | POA: Diagnosis not present

## 2020-04-23 DIAGNOSIS — Z5321 Procedure and treatment not carried out due to patient leaving prior to being seen by health care provider: Secondary | ICD-10-CM | POA: Diagnosis not present

## 2020-04-23 DIAGNOSIS — R29810 Facial weakness: Secondary | ICD-10-CM | POA: Diagnosis not present

## 2020-04-23 DIAGNOSIS — I491 Atrial premature depolarization: Secondary | ICD-10-CM | POA: Diagnosis not present

## 2020-04-23 LAB — CBC
HCT: 36.3 % — ABNORMAL LOW (ref 39.0–52.0)
Hemoglobin: 11.9 g/dL — ABNORMAL LOW (ref 13.0–17.0)
MCH: 32.8 pg (ref 26.0–34.0)
MCHC: 32.8 g/dL (ref 30.0–36.0)
MCV: 100 fL (ref 80.0–100.0)
Platelets: 253 10*3/uL (ref 150–400)
RBC: 3.63 MIL/uL — ABNORMAL LOW (ref 4.22–5.81)
RDW: 12.2 % (ref 11.5–15.5)
WBC: 4.5 10*3/uL (ref 4.0–10.5)
nRBC: 0 % (ref 0.0–0.2)

## 2020-04-23 LAB — BASIC METABOLIC PANEL
Anion gap: 9 (ref 5–15)
BUN: 10 mg/dL (ref 8–23)
CO2: 24 mmol/L (ref 22–32)
Calcium: 9.4 mg/dL (ref 8.9–10.3)
Chloride: 109 mmol/L (ref 98–111)
Creatinine, Ser: 0.94 mg/dL (ref 0.61–1.24)
GFR calc Af Amer: >60 mL/min (ref >60)
GFR calc non Af Amer: >60 mL/min (ref >60)
Glucose, Bld: 103 mg/dL — ABNORMAL HIGH (ref 70–99)
Potassium: 4.1 mmol/L (ref 3.5–5.1)
Sodium: 142 mmol/L (ref 135–145)

## 2020-04-23 MED ORDER — SODIUM CHLORIDE 0.9% FLUSH: 3.0000 mL | Freq: Once | INTRAVENOUS | Status: DC
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
## 2020-04-23 NOTE — ED Notes (Signed)
No call for reassessment or room.

## 2020-04-23 NOTE — ED Notes (Signed)
No response for vitals  

## 2020-04-25 NOTE — Addendum Note (Signed)
Addended by: Lynda Rainwater on: 04/25/2020 02:02 PM   Modules accepted: Orders

## 2020-04-30 ENCOUNTER — Ambulatory Visit: Payer: Medicare Other

## 2020-04-30 ENCOUNTER — Other Ambulatory Visit: Payer: Self-pay

## 2020-04-30 ENCOUNTER — Telehealth: Payer: Self-pay

## 2020-04-30 DIAGNOSIS — R350 Frequency of micturition: Secondary | ICD-10-CM

## 2020-04-30 DIAGNOSIS — E785 Hyperlipidemia, unspecified: Secondary | ICD-10-CM

## 2020-04-30 DIAGNOSIS — I10 Essential (primary) hypertension: Secondary | ICD-10-CM

## 2020-04-30 NOTE — Telephone Encounter (Signed)
Patient requesting refill of Alendronate 70 mg weekly to [facility]. Can you please put that in for him?  Thanks, Doristine Section Clinical Pharmacist Velora Heckler at [facility]  [phone]
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
## 2020-04-30 NOTE — Patient Instructions (Addendum)
Visit Information It was great speaking with you today!  Please let me know if you have any questions about our visit.  Goals Addressed            This Visit's Progress   . Chronic Care Management       CARE PLAN ENTRY  Current Barriers:  . Chronic Disease Management support, education, and care coordination needs related to Hypertension and Hyperlipidemia   Hypertension . Pharmacist Clinical Goal(s): o Over the next 30 days, patient will work with PharmD and providers to maintain BP goal <140/90 . Current regimen:  o Amlodipine 10 mg  . Patient self care activities - Over the next 30 days, patient will: o Check blood pressure 2-3 times weekly, document, and provide at future appointments o Ensure daily salt intake < 2300 mg/day  Hyperlipidemia . Pharmacist Clinical Goal(s): o Over the next 90 days, patient will work with PharmD and providers to maintain LDL goal < 100 . Current regimen:  o Atorvastatin 40 mg   Medication management . Pharmacist Clinical Goal(s): o Over the next 90 days, patient will work with PharmD and providers to achieve optimal medication adherence . Current pharmacy: [facility] . Interventions o Comprehensive medication review performed. o Continue current medication management strategy . Patient self care activities - Over the next 90 days, patient will: o Take medications as prescribed o Report any questions or concerns to PharmD and/or provider(s)        Mr. Sayani was given information about Chronic Care Management services today including:  1. CCM service includes personalized support from designated clinical staff supervised by his physician, including individualized plan of care and coordination with other care providers 2. 24/7 contact phone numbers for assistance for urgent and routine care needs. 3. Standard insurance, coinsurance, copays and deductibles apply for chronic care management only during months in which we provide at  least 20 minutes of these services. Most insurances cover these services at 100%, however patients may be responsible for any copay, coinsurance and/or deductible if applicable. This service may help you avoid the need for more expensive face-to-face services. 4. Only one practitioner may furnish and bill the service in a calendar month. 5. The patient may stop CCM services at any time (effective at the end of the month) by phone call to the office staff.  Patient agreed to services and verbal consent obtained.   The patient verbalized understanding of instructions provided today and agreed to receive a mailed copy of patient instruction and/or educational materials. Telephone follow up appointment with pharmacy team member scheduled for: 07/30/20 at 3:00 PM  [place] at [facility]  [phone]

## 2020-04-30 NOTE — Telephone Encounter (Signed)
Refill request last office visit 04/16/20 patient reports taken different strength Okay to refill medication pending for your review. Please advise

## 2020-05-01 ENCOUNTER — Other Ambulatory Visit: Payer: Self-pay

## 2020-05-01 DIAGNOSIS — R350 Frequency of micturition: Secondary | ICD-10-CM

## 2020-05-01 MED ORDER — TAMSULOSIN HCL 0.4 MG PO CAPS: 0.4000 mg | ORAL_CAPSULE | Freq: Every day | ORAL | 0 refills | Status: DC

## 2020-05-01 NOTE — Telephone Encounter (Signed)
Refill sent in, called patient to see if he have seen urologist as of now and to inform of refill. No answer unable to leave message.

## 2020-05-01 NOTE — Telephone Encounter (Signed)
Please send in 3 months of med. He is seeing or should be seeing urology now. Not sure what dose that are using.

## 2020-05-05 ENCOUNTER — Ambulatory Visit (INDEPENDENT_AMBULATORY_CARE_PROVIDER_SITE_OTHER): Payer: Medicare Other

## 2020-05-05 ENCOUNTER — Other Ambulatory Visit: Payer: Self-pay

## 2020-05-05 ENCOUNTER — Ambulatory Visit (INDEPENDENT_AMBULATORY_CARE_PROVIDER_SITE_OTHER): Payer: Medicare Other | Admitting: Nurse Practitioner

## 2020-05-05 ENCOUNTER — Encounter: Payer: Self-pay | Admitting: Nurse Practitioner

## 2020-05-05 VITALS — BP 136/76 | HR 77 | Temp 98.0°F | Ht 68.0 in | Wt 166.0 lb

## 2020-05-05 DIAGNOSIS — W19XXXA Unspecified fall, initial encounter: Secondary | ICD-10-CM

## 2020-05-05 DIAGNOSIS — S52572A Other intraarticular fracture of lower end of left radius, initial encounter for closed fracture: Secondary | ICD-10-CM

## 2020-05-05 DIAGNOSIS — M25531 Pain in right wrist: Secondary | ICD-10-CM

## 2020-05-05 MED ORDER — TRAMADOL HCL 50 MG PO TABS: 50.0000 mg | ORAL_TABLET | Freq: Three times a day (TID) | ORAL | 0 refills | Status: AC | PRN

## 2020-05-05 NOTE — Patient Instructions (Addendum)
X-ray indicates distal radial fracture, displaced. Entered urgent referral to ortho and sent tramadol rx for pain. He is schedule to see ortho on Wednesday.

## 2020-05-05 NOTE — Progress Notes (Signed)
Subjective:  Patient ID: Tanner Griffin, male    DOB: [birth year]  Age: 79 y.o. MRN: [id number]  CC: Pain (in right hand and wrist//fell last Tuesday//put ice on it an helps)  Wrist Injury  The incident occurred 5 to 7 days ago. The incident occurred at home. The injury mechanism was a fall. The pain is present in the right wrist. The quality of the pain is described as aching. Radiates to: radiate to elbow. The pain is at a severity of 8/10. The pain has been constant since the incident. Pertinent negatives include no numbness or tingling. The symptoms are aggravated by movement, lifting and palpation. He has tried ice and rest for the symptoms. The treatment provided no relief.  due to unsteady gait, he uses walker. Unable to use walker properly due to wrist pain He denies any other joint pain  Reviewed past Medical, Social and Family history today.  Outpatient Medications Prior to Visit  Medication Sig Dispense Refill  . alendronate (FOSAMAX) 70 MG tablet Take 1 tablet (70 mg total) by mouth every 7 (seven) days. Take with a full glass of water on an empty stomach. 12 tablet 4  . amLODipine (NORVASC) 10 MG tablet TAKE 1 TABLET BY MOUTH  DAILY 90 tablet 1  . atorvastatin (LIPITOR) 40 MG tablet TAKE 1 TABLET BY MOUTH  DAILY 90 tablet 1  . atorvastatin (LIPITOR) 40 MG tablet TAKE 1 TABLET BY MOUTH  DAILY 90 tablet 3  . meloxicam (MOBIC) 7.5 MG tablet Take 1 tablet (7.5 mg total) by mouth daily. 30 tablet 0  . methocarbamol (ROBAXIN) 500 MG tablet Take 1 tablet (500 mg total) by mouth every 8 (eight) hours as needed for muscle spasms. 30 tablet 1  . oxybutynin (DITROPAN XL) 15 MG 24 hr tablet Take 15 mg by mouth daily.    . polyethylene glycol powder (GLYCOLAX/MIRALAX) powder Take 17 g by mouth 2 (two) times daily as needed. 3350 g 1  . tamsulosin (FLOMAX) 0.4 MG CAPS capsule Take 1 capsule (0.4 mg total) by mouth daily after supper. 90 capsule 0  . HYDROcodone-acetaminophen (NORCO) 10-325  MG tablet Take 1-2 tablets by mouth every 4 (four) hours as needed for moderate pain. Maximum dose per 24 hours - 8 pills 10 tablet 0  . aspirin EC 81 MG tablet Take 81 mg by mouth daily.     No facility-administered medications prior to visit.    ROS See HPI  Objective:  BP 136/76   Pulse 77   Temp 98 F (36.7 C) (Tympanic)   Ht 5\' 8"  (1.727 m)   Wt 166 lb (75.3 kg)   SpO2 98%   BMI 25.24 kg/m   BP Readings from Last 3 Encounters:  05/05/20 136/76  04/23/20 121/70  03/31/20 132/64    Wt Readings from Last 3 Encounters:  05/05/20 166 lb (75.3 kg)  03/31/20 164 lb 9.6 oz (74.7 kg)  12/10/19 171 lb 6.4 oz (77.7 kg)    Physical Exam Vitals reviewed.  Pulmonary:     Effort: Pulmonary effort is normal.  Musculoskeletal:        General: Swelling, tenderness and signs of injury present.     Right elbow: Normal.     Right forearm: Normal.     Right wrist: Swelling, effusion, tenderness and bony tenderness present. No snuff box tenderness. Decreased range of motion. Normal pulse.     Right hand: Normal.  Neurological:     Mental Status: He is alert  and oriented to person, place, and time.     Lab Results  Component Value Date   WBC 4.5 04/23/2020   HGB 11.9 (L) 04/23/2020   HCT 36.3 (L) 04/23/2020   PLT 253 04/23/2020   GLUCOSE 103 (H) 04/23/2020   CHOL 139 03/15/2018   TRIG 77.0 03/15/2018   HDL 46.30 03/15/2018   LDLDIRECT 84.0 11/29/2019   LDLCALC 77 03/15/2018   ALT 21 03/31/2020   AST 25 03/31/2020   NA 142 04/23/2020   K 4.1 04/23/2020   CL 109 04/23/2020   CREATININE 0.94 04/23/2020   BUN 10 04/23/2020   CO2 24 04/23/2020   TSH 1.03 03/15/2018   PSA 5.96 (H) 11/14/2018   INR 1.0 06/12/2019   HGBA1C 5.7 03/15/2018   MICROALBUR 6.7 (H) 03/15/2018    Assessment & Plan:  This visit occurred during the SARS-CoV-2 public health emergency.  Safety protocols were in place, including screening questions prior to the visit, additional usage of staff PPE,  and extensive cleaning of exam room while observing appropriate contact time as indicated for disinfecting solutions.   Garwin was seen today for pain.  Diagnoses and all orders for this visit:  Fall, initial encounter -     DG Wrist Complete Right -     traMADol (ULTRAM) 50 MG tablet; Take 1 tablet (50 mg total) by mouth every 8 (eight) hours as needed for up to 5 days for severe pain. -     Ambulatory referral to Orthopedic Surgery  Other closed intra-articular fracture of distal end of left radius, initial encounter -     DG Wrist Complete Right -     traMADol (ULTRAM) 50 MG tablet; Take 1 tablet (50 mg total) by mouth every 8 (eight) hours as needed for up to 5 days for severe pain. -     Ambulatory referral to Orthopedic Surgery  X-ray indicates distal radial fracture, displaced. Entered urgent referral to ortho and sent tramadol rx for pain. He is schedule to see ortho on Wednesday.  I have discontinued Princess Bruins. Cozzens's HYDROcodone-acetaminophen. I am also having him start on traMADol. Additionally, I am having him maintain his polyethylene glycol powder, aspirin EC, atorvastatin, amLODipine, atorvastatin, methocarbamol, meloxicam, oxybutynin, alendronate, and tamsulosin.  Meds ordered this encounter  Medications  . traMADol (ULTRAM) 50 MG tablet    Sig: Take 1 tablet (50 mg total) by mouth every 8 (eight) hours as needed for up to 5 days for severe pain.    Dispense:  15 tablet    Refill:  0    Order Specific Question:   Supervising Provider    Answer:   Ronnald Nian [id number]    Problem List Items Addressed This Visit    None    Visit Diagnoses    Fall, initial encounter    -  Primary   Relevant Medications   traMADol (ULTRAM) 50 MG tablet   Other Relevant Orders   DG Wrist Complete Right (Completed)   Ambulatory referral to Orthopedic Surgery   Other closed intra-articular fracture of distal end of left radius, initial encounter       Relevant Medications    traMADol (ULTRAM) 50 MG tablet   Other Relevant Orders   DG Wrist Complete Right (Completed)   Ambulatory referral to Orthopedic Surgery       Follow-up: No follow-ups on file.  Wilfred Lacy, NP

## 2020-05-07 ENCOUNTER — Ambulatory Visit: Payer: Medicare Other | Admitting: Family Medicine

## 2020-05-12 NOTE — Progress Notes (Signed)
He is scheduled to see me in July, otherwise he can make an appointment sooner if he would like.

## 2020-05-14 ENCOUNTER — Telehealth: Payer: Self-pay | Admitting: Family Medicine

## 2020-05-14 ENCOUNTER — Emergency Department (HOSPITAL_COMMUNITY)
Admission: EM | Admit: 2020-05-14 | Discharge: 2020-05-14 | Disposition: A | Discharge status: Home / Self Care | Payer: Medicare Other | Attending: Emergency Medicine | Admitting: Emergency Medicine

## 2020-05-14 ENCOUNTER — Other Ambulatory Visit: Payer: Self-pay

## 2020-05-14 ENCOUNTER — Encounter (HOSPITAL_COMMUNITY): Payer: Self-pay

## 2020-05-14 ENCOUNTER — Emergency Department (HOSPITAL_COMMUNITY): Payer: Medicare Other

## 2020-05-14 DIAGNOSIS — Y939 Activity, unspecified: Secondary | ICD-10-CM | POA: Diagnosis not present

## 2020-05-14 DIAGNOSIS — Y929 Unspecified place or not applicable: Secondary | ICD-10-CM | POA: Insufficient documentation

## 2020-05-14 DIAGNOSIS — E119 Type 2 diabetes mellitus without complications: Secondary | ICD-10-CM | POA: Diagnosis not present

## 2020-05-14 DIAGNOSIS — I10 Essential (primary) hypertension: Secondary | ICD-10-CM | POA: Insufficient documentation

## 2020-05-14 DIAGNOSIS — Z96653 Presence of artificial knee joint, bilateral: Secondary | ICD-10-CM | POA: Insufficient documentation

## 2020-05-14 DIAGNOSIS — Z7982 Long term (current) use of aspirin: Secondary | ICD-10-CM | POA: Diagnosis not present

## 2020-05-14 DIAGNOSIS — Y999 Unspecified external cause status: Secondary | ICD-10-CM | POA: Insufficient documentation

## 2020-05-14 DIAGNOSIS — Z7984 Long term (current) use of oral hypoglycemic drugs: Secondary | ICD-10-CM | POA: Diagnosis not present

## 2020-05-14 DIAGNOSIS — W010XXA Fall on same level from slipping, tripping and stumbling without subsequent striking against object, initial encounter: Secondary | ICD-10-CM | POA: Insufficient documentation

## 2020-05-14 DIAGNOSIS — Z79899 Other long term (current) drug therapy: Secondary | ICD-10-CM | POA: Diagnosis not present

## 2020-05-14 DIAGNOSIS — M25561 Pain in right knee: Secondary | ICD-10-CM | POA: Diagnosis not present

## 2020-05-14 DIAGNOSIS — Z743 Need for continuous supervision: Secondary | ICD-10-CM | POA: Diagnosis not present

## 2020-05-14 DIAGNOSIS — W19XXXA Unspecified fall, initial encounter: Secondary | ICD-10-CM | POA: Diagnosis not present

## 2020-05-14 MED ORDER — LIDOCAINE 5 % EX PTCH: 1.0000 | MEDICATED_PATCH | CUTANEOUS | 0 refills | Status: DC

## 2020-05-14 NOTE — Telephone Encounter (Signed)
Tanner Griffin is calling and requesting a refill for flomax sent to [facility]. CB is [phone]

## 2020-05-14 NOTE — ED Provider Notes (Signed)
[facility] DEPT Provider Note   CSN: [id number] Arrival date & time: 05/14/20  1944     History Chief Complaint  Patient presents with  . Fall    Tanner Griffin is a 79 y.o. male.  HPI      Had knee replacement, now having pain in his knee 2-3 months ago, describes it as "locking up" Could walk for a mile before but can't now Reports he didn't fall but called EMS because he couldn't move because of pain in the knee No weakness, because of pain couldn't stand up on the porch Then reports he did fall down because he was having difficulty standing up, didn't hit head or get knocked out Today was breaking down an air conditioner, working outside, then knee locked up with pain Pain just stays in knee.   No numbness.  No fever Has not see orthopedics regarding knee Stuck straight and can't bend it  Past Medical History:  Diagnosis Date  . Arthritis   . Arthrofibrosis of total knee arthroplasty, right 03/26/2014  . Constipation    takes Miralax daily as needed  . Headache    occasional  . HYPERLIPIDEMIA    takes Lipitor daily  . HYPERTENSION    takes Amlodipine and Labetalol daily  . Joint pain   . Joint swelling   . Nocturia   . Osteoarthritis of left knee 03/26/2014  . Pneumonia    hx of-as a child  . Prostate cancer ([place])   . Urinary frequency    takes Flomax daily  . Wears eyeglasses   . Wears partial dentures    top     Patient Active Problem List   Diagnosis Date Noted  . Macrocytic anemia 03/31/2020  . Paget's disease of the bone 12/10/2019  . Acute left-sided low back pain without sciatica 11/29/2019  . Malignant neoplasm of prostate ([place]) 02/12/2019  . Lumbar spinal stenosis 09/25/2018  . Right lumbar radiculopathy 09/05/2018  . Polyneuropathy 09/05/2018  . Right leg weakness 07/26/2018  . Pain in both hands 03/29/2018  . Elevated PSA 03/16/2018  . Right inguinal hernia 03/14/2017  . Degenerative disc disease,  lumbar 01/11/2017  . Polyarthralgia 04/26/2016  . Pain due to total knee replacement ([place]) 04/26/2016  . Constipation 01/20/2015  . Focal myoclonus 06/05/2014  . Osteoarthritis of knee s/p bilateral knee replacements 03/26/2014  . Spongiotic dermatitis 03/19/2013  . Personal history of colonic adenoma 10/30/2012  . BPH with obstruction/lower urinary tract symptoms 06/05/2012  . Right leg pain 04/17/2011  . Type II diabetes mellitus, well controlled ([place]) 02/12/2008  . Hyperlipidemia 06/19/2007  . Essential hypertension 06/19/2007    Past Surgical History:  Procedure Laterality Date  . COLONOSCOPY    . HERNIA REPAIR     inguinal 04/14/17  . INGUINAL HERNIA REPAIR Right 04/14/2017   Procedure: RIGHT INGUINAL HERNIA REPAIR WITH MESH;  Surgeon: Armandina Gemma, MD;  Location: [facility];  Service: General;  Laterality: Right;  . INSERTION OF MESH Right 04/14/2017   Procedure: INSERTION OF MESH;  Surgeon: Armandina Gemma, MD;  Location: [facility];  Service: General;  Laterality: Right;  . KNEE CLOSED REDUCTION Right 03/26/2014   Procedure: CLOSED MANIPULATION KNEE;  Surgeon: [name], MD;  Location: [facility];  Service: Orthopedics;  Laterality: Right;  . RADIOACTIVE SEED IMPLANT N/A 06/15/2019   Procedure: RADIOACTIVE SEED IMPLANT/BRACHYTHERAPY IMPLANT;  Surgeon: [name], MD;  Location: [facility];  Service: Urology;  Laterality: N/A;  .  SPACE OAR INSTILLATION N/A 06/15/2019   Procedure: SPACE OAR INSTILLATION;  Surgeon: [name], MD;  Location: [facility];  Service: Urology;  Laterality: N/A;  . TOTAL KNEE ARTHROPLASTY  2011   right, Dr. [name] per pt  . TOTAL KNEE ARTHROPLASTY Left 03/26/2014   DR LANDAU  . TOTAL KNEE ARTHROPLASTY Left 03/26/2014   Procedure: TOTAL KNEE ARTHROPLASTY;  Surgeon: [name], MD;  Location: [facility];  Service: Orthopedics;  Laterality: Left;       Family History  Problem Relation Age of Onset  . Brain cancer Father   .  Renal Disease Brother        ESRD, unknown cause  . Stomach cancer Neg Hx   . Esophageal cancer Neg Hx   . Rectal cancer Neg Hx   . Breast cancer Neg Hx   . Colon cancer Neg Hx   . Pancreatic cancer Neg Hx     Social History   Tobacco Use  . Smoking status: Never Smoker  . Smokeless tobacco: Never Used  Substance Use Topics  . Alcohol use: No    Alcohol/week: 0.0 standard drinks  . Drug use: No    Home Medications Prior to Admission medications   Medication Sig Start Date End Date Taking? Authorizing Provider  alendronate (FOSAMAX) 70 MG tablet Take 1 tablet (70 mg total) by mouth every 7 (seven) days. Take with a full glass of water on an empty stomach. 03/31/20   Libby Maw, MD  amLODipine (NORVASC) 10 MG tablet TAKE 1 TABLET BY MOUTH  DAILY 04/19/19   Libby Maw, MD  aspirin EC 81 MG tablet Take 81 mg by mouth daily.    [provider]  atorvastatin (LIPITOR) 40 MG tablet TAKE 1 TABLET BY MOUTH  DAILY 04/19/19   Libby Maw, MD  atorvastatin (LIPITOR) 40 MG tablet TAKE 1 TABLET BY MOUTH  DAILY 08/14/19   Libby Maw, MD  lidocaine (LIDODERM) 5 % Place 1 patch onto the skin daily. Remove & Discard patch within 12 hours or as directed by MD 05/14/20   Gareth Morgan, MD  meloxicam (MOBIC) 7.5 MG tablet Take 1 tablet (7.5 mg total) by mouth daily. 11/29/19   Libby Maw, MD  methocarbamol (ROBAXIN) 500 MG tablet Take 1 tablet (500 mg total) by mouth every 8 (eight) hours as needed for muscle spasms. 11/29/19   Libby Maw, MD  oxybutynin (DITROPAN XL) 15 MG 24 hr tablet Take 15 mg by mouth daily. 03/13/20   [provider]  polyethylene glycol powder (GLYCOLAX/MIRALAX) powder Take 17 g by mouth 2 (two) times daily as needed. 03/15/18   Libby Maw, MD  tamsulosin (FLOMAX) 0.4 MG CAPS capsule Take 1 capsule (0.4 mg total) by mouth daily after supper. 05/01/20   Libby Maw, MD     Allergies    Telmisartan  Review of Systems   Review of Systems  Constitutional: Negative for fever.  Eyes: Negative for visual disturbance.  Respiratory: Negative for cough and shortness of breath.   Cardiovascular: Negative for chest pain.  Gastrointestinal: Negative for abdominal pain, nausea and vomiting.  Musculoskeletal: Positive for arthralgias.  Neurological: Negative for syncope and headaches.    Physical Exam Updated Vital Signs BP (!) 146/65   Pulse 75   Temp 98.2 F (36.8 C) (Oral)   Resp 16   Ht 5' 7.5" (1.715 m)   Wt 75.8 kg   SpO2 96%   BMI  25.77 kg/m   Physical Exam Vitals and nursing note reviewed.  Constitutional:      General: He is not in acute distress.    Appearance: Normal appearance. He is not ill-appearing, toxic-appearing or diaphoretic.  HENT:     Head: Normocephalic.  Eyes:     Conjunctiva/sclera: Conjunctivae normal.  Cardiovascular:     Rate and Rhythm: Normal rate and regular rhythm.     Pulses: Normal pulses.  Pulmonary:     Effort: Pulmonary effort is normal. No respiratory distress.  Musculoskeletal:        General: Tenderness (Right knee) present. No swelling, deformity or signs of injury.     Cervical back: No rigidity.     Comments: Normal ROM of knee NO asymmetric swelling Normal pulses No erythema  Skin:    General: Skin is warm and dry.     Coloration: Skin is not jaundiced or pale.  Neurological:     General: No focal deficit present.     Mental Status: He is alert and oriented to person, place, and time.     Cranial Nerves: No cranial nerve deficit.     Sensory: No sensory deficit.     Motor: No weakness.     ED Results / Procedures / Treatments   Labs (all labs ordered are listed, but only abnormal results are displayed) Labs Reviewed - No data to display  EKG None  Radiology DG Knee Complete 4 Views Right  Result Date: 05/14/2020 CLINICAL DATA:  Right knee pain, fell EXAM: RIGHT KNEE - COMPLETE 4+  VIEW COMPARISON:  07/27/2018 FINDINGS: Frontal, bilateral oblique, lateral views of the right knee demonstrate 3 component right knee arthroplasty in stable position. No acute displaced fractures. No joint effusion. IMPRESSION: 1. Stable right knee arthroplasty. 2. No acute fracture. Electronically Signed   By: Randa Ngo M.D.   On: 05/14/2020 21:07    Procedures Procedures (including critical care time)  Medications Ordered in ED Medications - No data to display  ED Course  I have reviewed the triage vital signs and the nursing notes.  Pertinent labs & imaging results that were available during my care of the patient were reviewed by me and considered in my medical decision making (see chart for details).    MDM Rules/Calculators/A&P                      79 year old male with history of arthritis, hypertension, hyperlipidemia, prostate cancer, right lumbar radiculopathy, lumbar spinal stenosis , focal myoclonus, DM, presents via EMS due to mechanical fall from right knee pain and right knee locking up.  X-ray shows no evidence of acute abnormalities.  Exam is not consistent with septic arthritis.  He has good arterial pulses, no sign of acute arterial occlusion.  No asymmetric swelling to suggest DVT.  No sign of cellulitis.  He has equal strength on exam, and history and physical are not consistent with CVA.  Episode of right knee locking up is not consistent with focal seizure.  No pain from fall or concern for intracranial or spinal injury.  Likely pain secondary to arthritis. Given rx for lidocaine patch, recommend tylenol for main and follow up with his Orthopedic surgeon.    Final Clinical Impression(s) / ED Diagnoses Final diagnoses:  Acute pain of right knee    Rx / DC Orders ED Discharge Orders         Ordered    lidocaine (LIDODERM) 5 %  Every 24 hours  05/14/20 2125           Gareth Morgan, MD 05/16/20 [phone]

## 2020-05-14 NOTE — ED Triage Notes (Signed)
Patient arrived via [facility] due to a mechanical fall, patient refused transport then fell again. Patient denies any use of blood thinners, LOC, or hitting his head when he fell. Patient has complaints of right knee/leg pain. States he has been working outside since [employer] today.

## 2020-05-20 ENCOUNTER — Other Ambulatory Visit: Payer: Self-pay

## 2020-05-20 DIAGNOSIS — R350 Frequency of micturition: Secondary | ICD-10-CM

## 2020-05-20 MED ORDER — TAMSULOSIN HCL 0.4 MG PO CAPS: 0.4000 mg | ORAL_CAPSULE | Freq: Every day | ORAL | 0 refills | Status: DC

## 2020-05-21 DIAGNOSIS — T8484XA Pain due to internal orthopedic prosthetic devices, implants and grafts, initial encounter: Secondary | ICD-10-CM | POA: Diagnosis not present

## 2020-05-22 ENCOUNTER — Ambulatory Visit: Payer: Medicare Other | Admitting: Family Medicine

## 2020-05-29 ENCOUNTER — Other Ambulatory Visit: Payer: Self-pay

## 2020-05-30 ENCOUNTER — Encounter: Payer: Self-pay | Admitting: Family Medicine

## 2020-05-30 ENCOUNTER — Ambulatory Visit (INDEPENDENT_AMBULATORY_CARE_PROVIDER_SITE_OTHER): Payer: Medicare Other | Admitting: Family Medicine

## 2020-05-30 VITALS — BP 100/64 | HR 70 | Temp 97.1°F | Ht 67.5 in | Wt 158.0 lb

## 2020-05-30 DIAGNOSIS — S52572A Other intraarticular fracture of lower end of left radius, initial encounter for closed fracture: Secondary | ICD-10-CM | POA: Diagnosis not present

## 2020-05-30 DIAGNOSIS — M889 Osteitis deformans of unspecified bone: Secondary | ICD-10-CM | POA: Diagnosis not present

## 2020-05-30 DIAGNOSIS — S52501A Unspecified fracture of the lower end of right radius, initial encounter for closed fracture: Secondary | ICD-10-CM | POA: Insufficient documentation

## 2020-05-30 MED ORDER — ALENDRONATE SODIUM 70 MG PO TABS: 70.0000 mg | ORAL_TABLET | ORAL | 4 refills | Status: DC

## 2020-05-30 NOTE — Progress Notes (Signed)
Established Patient Office Visit  Subjective:  Patient ID: Tanner Griffin, male    DOB: April 19, 1941  Age: 80 y.o. MRN: [id number]  CC:  Chief Complaint  Patient presents with  . Wrist Pain    Pt was here to [facility] in May due to a fall.  [facility] put a referral in for him to be seen at the orthopedic surgery and he wasn't able to make appointment    HPI Tanner Griffin presents for follow-up of his wrist injury from the 17th of last month.  It was a [place] injury.  X-rays confirmed a nondisplaced radial head fracture.  Had been referred to orthopedics but could not make the appointment because his wife became ill.  Pain is better now but the wrist is stiff and there is a lump in it.  Has not filled the Fosamax for his Paget's disease.  He said that   Past Medical History:  Diagnosis Date  . Arthritis   . Arthrofibrosis of total knee arthroplasty, right 03/26/2014  . Constipation    takes Miralax daily as needed  . Headache    occasional  . HYPERLIPIDEMIA    takes Lipitor daily  . HYPERTENSION    takes Amlodipine and Labetalol daily  . Joint pain   . Joint swelling   . Nocturia   . Osteoarthritis of left knee 03/26/2014  . Pneumonia    hx of-as a child  . Prostate cancer ([place])   . Urinary frequency    takes Flomax daily  . Wears eyeglasses   . Wears partial dentures    top     Past Surgical History:  Procedure Laterality Date  . COLONOSCOPY    . HERNIA REPAIR     inguinal 04/14/17  . INGUINAL HERNIA REPAIR Right 04/14/2017   Procedure: RIGHT INGUINAL HERNIA REPAIR WITH MESH;  Surgeon: Armandina Gemma, MD;  [facility];  Service: General;  Laterality: Right;  . INSERTION OF MESH Right 04/14/2017   Procedure: INSERTION OF MESH;  Surgeon: Armandina Gemma, MD;  [facility];  Service: General;  Laterality: Right;  . KNEE CLOSED REDUCTION Right 03/26/2014   Procedure: CLOSED MANIPULATION KNEE;  Surgeon: [name], MD;  Location: [facility];  Service: Orthopedics;  Laterality: Right;    . RADIOACTIVE SEED IMPLANT N/A 06/15/2019   Procedure: RADIOACTIVE SEED IMPLANT/BRACHYTHERAPY IMPLANT;  Surgeon: [name], MD;  Location: [facility];  Service: Urology;  Laterality: N/A;  . SPACE OAR INSTILLATION N/A 06/15/2019   Procedure: SPACE OAR INSTILLATION;  Surgeon: [name], MD;  Location: [facility];  Service: Urology;  Laterality: N/A;  . TOTAL KNEE ARTHROPLASTY  2011   right, Dr. [name] per pt  . TOTAL KNEE ARTHROPLASTY Left 03/26/2014   DR LANDAU  . TOTAL KNEE ARTHROPLASTY Left 03/26/2014   Procedure: TOTAL KNEE ARTHROPLASTY;  Surgeon: [name], MD;  Location: [facility];  Service: Orthopedics;  Laterality: Left;    Family History  Problem Relation Age of Onset  . Brain cancer Father   . Renal Disease Brother        ESRD, unknown cause  . Stomach cancer Neg Hx   . Esophageal cancer Neg Hx   . Rectal cancer Neg Hx   . Breast cancer Neg Hx   . Colon cancer Neg Hx   . Pancreatic cancer Neg Hx     Social History   Socioeconomic History  . Marital status: Married    Spouse name:  Mary  . Number of children: 5  . Years of education: 9th  . Highest education level: Not on file  Occupational History  . Occupation: retired    Comment: Architect  Tobacco Use  . Smoking status: Never Smoker  . Smokeless tobacco: Never Used  Vaping Use  . Vaping Use: Never used  Substance and Sexual Activity  . Alcohol use: No    Alcohol/week: 0.0 standard drinks  . Drug use: No  . Sexual activity: Not Currently  Other Topics Concern  . Not on file  Social History Narrative   Married, together 73 years in 2016. 1 son, 2 daughters. Can't count # grandkids at least 10, 11. 1 great-granddaughter.   Resides in [place], on [address].   Grandson and daughter live with them.   Retired from Architect work.   Hobbies: time with grand kids-play basketball.   Social Determinants of Health   Financial Resource Strain: Low Risk   .  Difficulty of Paying Living Expenses: Not hard at all  Food Insecurity:   . Worried About Charity fundraiser in the Last Year:   . Arboriculturist in the Last Year:   Transportation Needs:   . Film/video editor (Medical):   [name] Lack of Transportation (Non-Medical):   Physical Activity: Inactive  . Days of Exercise per Week: 0 days  . Minutes of Exercise per Session: 0 min  Stress:   . Feeling of Stress :   Social Connections:   . Frequency of Communication with Friends and Family:   . Frequency of Social Gatherings with Friends and Family:   . Attends Religious Services:   . Active Member of Clubs or Organizations:   . Attends Archivist Meetings:   [name] Marital Status:   Intimate Partner Violence:   . Fear of Current or Ex-Partner:   . Emotionally Abused:   [name] Physically Abused:   . Sexually Abused:     Outpatient Medications Prior to Visit  Medication Sig Dispense Refill  . amLODipine (NORVASC) 10 MG tablet TAKE 1 TABLET BY MOUTH  DAILY 90 tablet 1  . aspirin EC 81 MG tablet Take 81 mg by mouth daily.    [name] atorvastatin (LIPITOR) 40 MG tablet TAKE 1 TABLET BY MOUTH  DAILY 90 tablet 1  . oxybutynin (DITROPAN XL) 15 MG 24 hr tablet Take 15 mg by mouth daily.    . polyethylene glycol powder (GLYCOLAX/MIRALAX) powder Take 17 g by mouth 2 (two) times daily as needed. 3350 g 1  . tamsulosin (FLOMAX) 0.4 MG CAPS capsule Take 1 capsule (0.4 mg total) by mouth daily after supper. 90 capsule 0  . lidocaine (LIDODERM) 5 % Place 1 patch onto the skin daily. Remove & Discard patch within 12 hours or as directed by MD 30 patch 0  . meloxicam (MOBIC) 7.5 MG tablet Take 1 tablet (7.5 mg total) by mouth daily. (Patient not taking: Reported on 05/30/2020) 30 tablet 0  . methocarbamol (ROBAXIN) 500 MG tablet Take 1 tablet (500 mg total) by mouth every 8 (eight) hours as needed for muscle spasms. (Patient not taking: Reported on 05/30/2020) 30 tablet 1  . alendronate (FOSAMAX) 70 MG tablet  Take 1 tablet (70 mg total) by mouth every 7 (seven) days. Take with a full glass of water on an empty stomach. 12 tablet 4  . atorvastatin (LIPITOR) 40 MG tablet TAKE 1 TABLET BY MOUTH  DAILY 90 tablet 3   No facility-administered  medications prior to visit.    Allergies  Allergen Reactions  . Telmisartan Other (See Comments)    Reaction:  Headache     ROS Review of Systems  Constitutional: Negative.   Respiratory: Negative.   Cardiovascular: Negative.   Gastrointestinal: Negative.   Genitourinary: Negative.   Musculoskeletal: Positive for arthralgias and joint swelling.  Neurological: Negative for weakness.  Psychiatric/Behavioral: Negative.       Objective:    Physical Exam Vitals and nursing note reviewed.  Constitutional:      General: He is not in acute distress.    Appearance: Normal appearance. He is normal weight. He is not ill-appearing or diaphoretic.  HENT:     Head: Normocephalic and atraumatic.  Pulmonary:     Effort: Pulmonary effort is normal.  Musculoskeletal:     Right wrist: Swelling, tenderness and bony tenderness present. No deformity or effusion. Decreased range of motion.  Neurological:     Mental Status: He is alert and oriented to person, place, and time.  Psychiatric:        Mood and Affect: Mood normal.        Behavior: Behavior normal.     BP 100/64 (BP Location: Left Arm, Patient Position: Sitting, Cuff Size: Normal)   Pulse 70   Temp (!) 97.1 F (36.2 C) (Temporal)   Ht 5' 7.5" (1.715 m)   Wt 158 lb (71.7 kg)   SpO2 97%   BMI 24.38 kg/m  Wt Readings from Last 3 Encounters:  05/30/20 158 lb (71.7 kg)  05/14/20 167 lb (75.8 kg)  05/05/20 166 lb (75.3 kg)     Health Maintenance Due  Topic Date Due  . Hepatitis C Screening  Never done  . COVID-19 Vaccine (1) Never done  . FOOT EXAM  10/16/2016  . OPHTHALMOLOGY EXAM  11/18/2016  . HEMOGLOBIN A1C  09/15/2018  . URINE MICROALBUMIN  03/16/2019  . TETANUS/TDAP  07/04/2019     There are no preventive care reminders to display for this patient.  Lab Results  Component Value Date   TSH 1.03 03/15/2018   Lab Results  Component Value Date   WBC 4.5 04/23/2020   HGB 11.9 (L) 04/23/2020   HCT 36.3 (L) 04/23/2020   MCV 100.0 04/23/2020   PLT 253 04/23/2020   Lab Results  Component Value Date   NA 142 04/23/2020   K 4.1 04/23/2020   CO2 24 04/23/2020   GLUCOSE 103 (H) 04/23/2020   BUN 10 04/23/2020   CREATININE 0.94 04/23/2020   BILITOT 1.0 03/31/2020   ALKPHOS 149 (H) 03/31/2020   AST 25 03/31/2020   ALT 21 03/31/2020   PROT 6.8 03/31/2020   ALBUMIN 4.2 03/31/2020   CALCIUM 9.4 04/23/2020   ANIONGAP 9 04/23/2020   GFR 113.42 03/31/2020   Lab Results  Component Value Date   CHOL 139 03/15/2018   Lab Results  Component Value Date   HDL 46.30 03/15/2018   Lab Results  Component Value Date   LDLCALC 77 03/15/2018   Lab Results  Component Value Date   TRIG 77.0 03/15/2018   Lab Results  Component Value Date   CHOLHDL 3 03/15/2018   Lab Results  Component Value Date   HGBA1C 5.7 03/15/2018      Assessment & Plan:   Problem List Items Addressed This Visit      Musculoskeletal and Integument   Paget's disease of the bone   Relevant Medications   alendronate (FOSAMAX) 70 MG tablet  Closed fracture of left distal radius - Primary   Relevant Orders   Ambulatory referral to Sports Medicine      Meds ordered this encounter  Medications  . alendronate (FOSAMAX) 70 MG tablet    Sig: Take 1 tablet (70 mg total) by mouth every 7 (seven) days. Take with a full glass of water on an empty stomach.    Dispense:  12 tablet    Refill:  4    Follow-up: No follow-ups on file.   Patient will obtain a cock-up wrist splint at the pharmacy and work on range of motion.  I demonstrated that in the exam room with him. Libby Maw, MD

## 2020-06-01 IMAGING — CR DG KNEE COMPLETE 4+V*R*
4 series · 4 of 4 positions shown · non-contrast
Comparison: Right knee series of April 26, 2016

CLINICAL DATA: Two years of right knee pain since total joint
replacement.

EXAM:
RIGHT KNEE - COMPLETE 4+ VIEW

[w knee ap right]
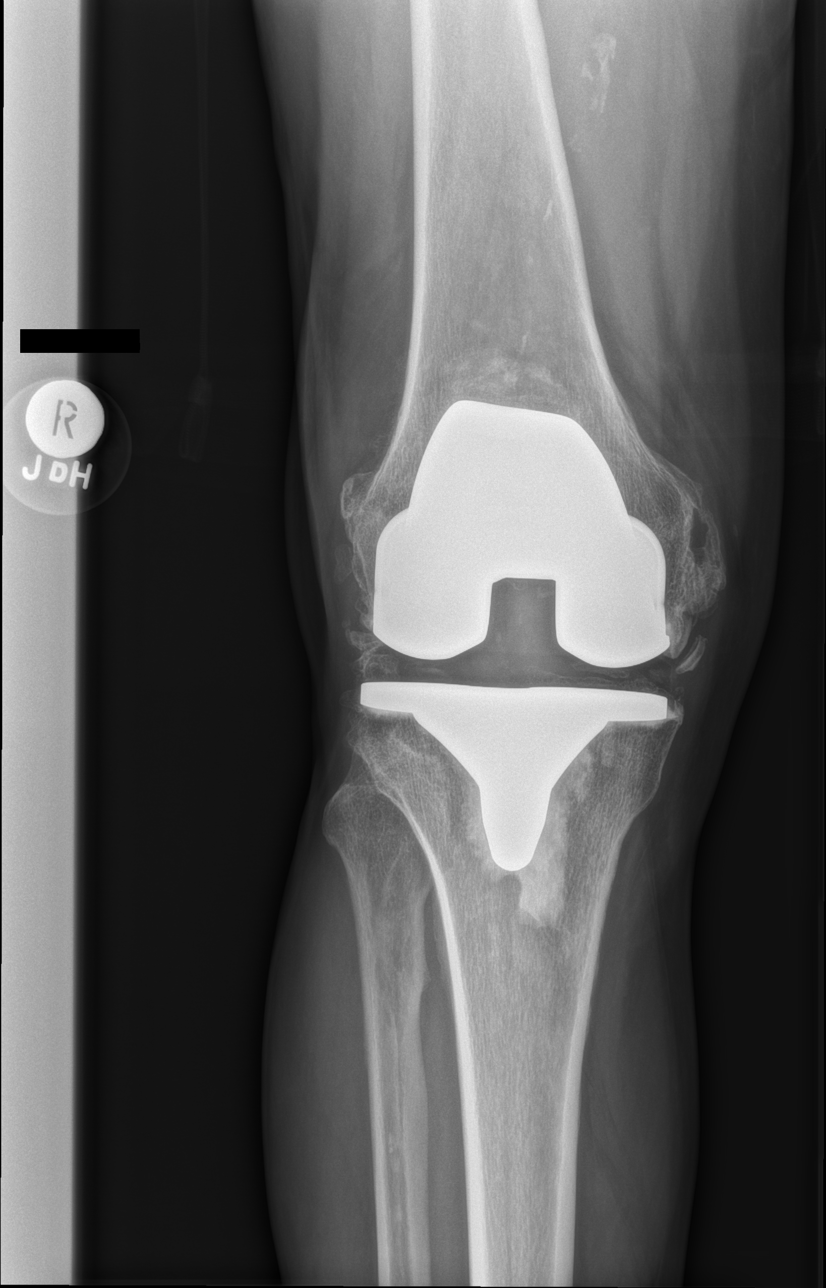

[w knee lat right]
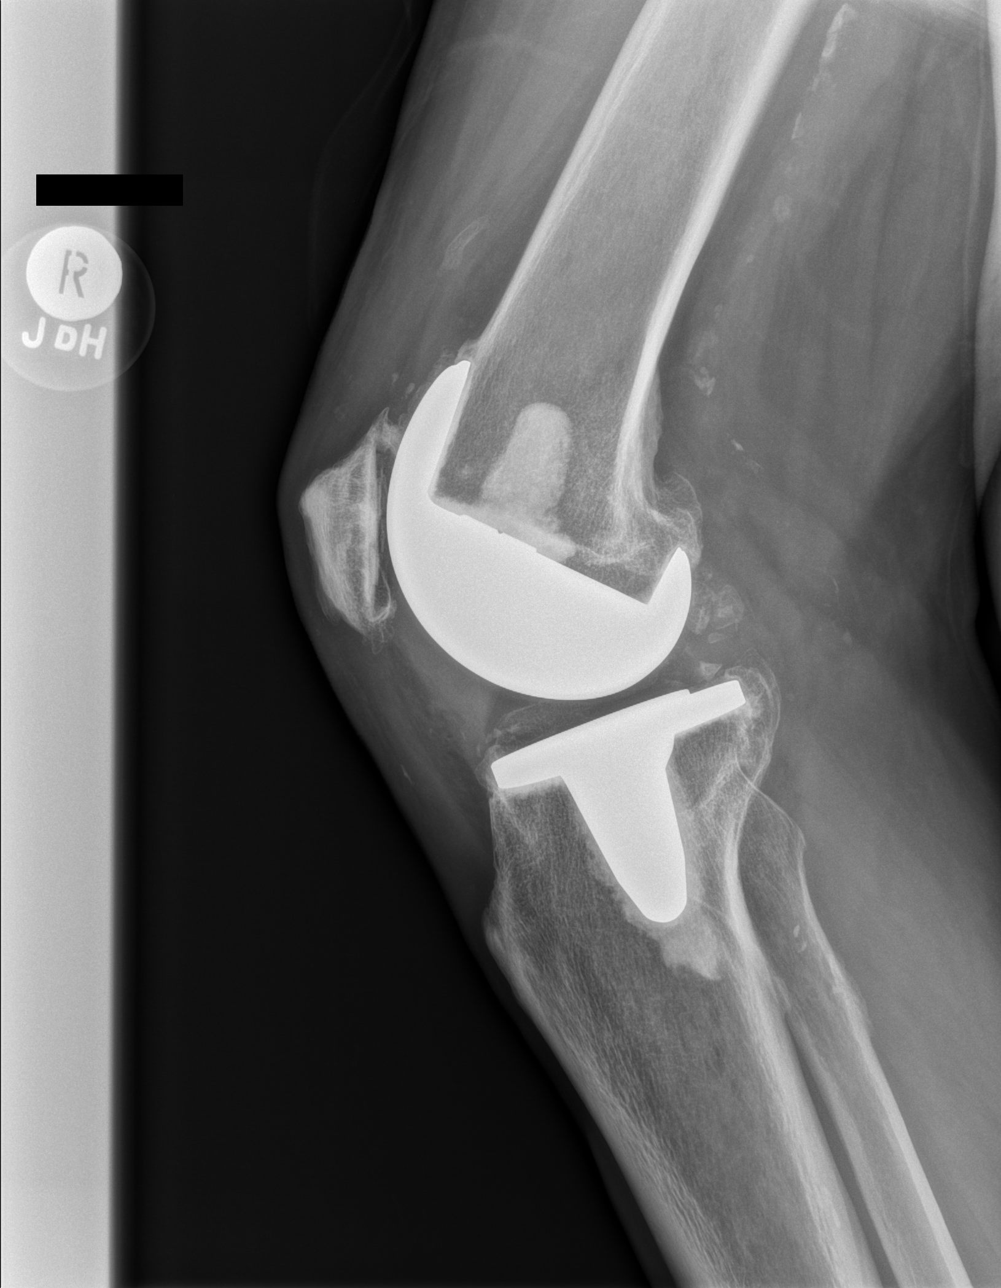

[x knee tunnel right]
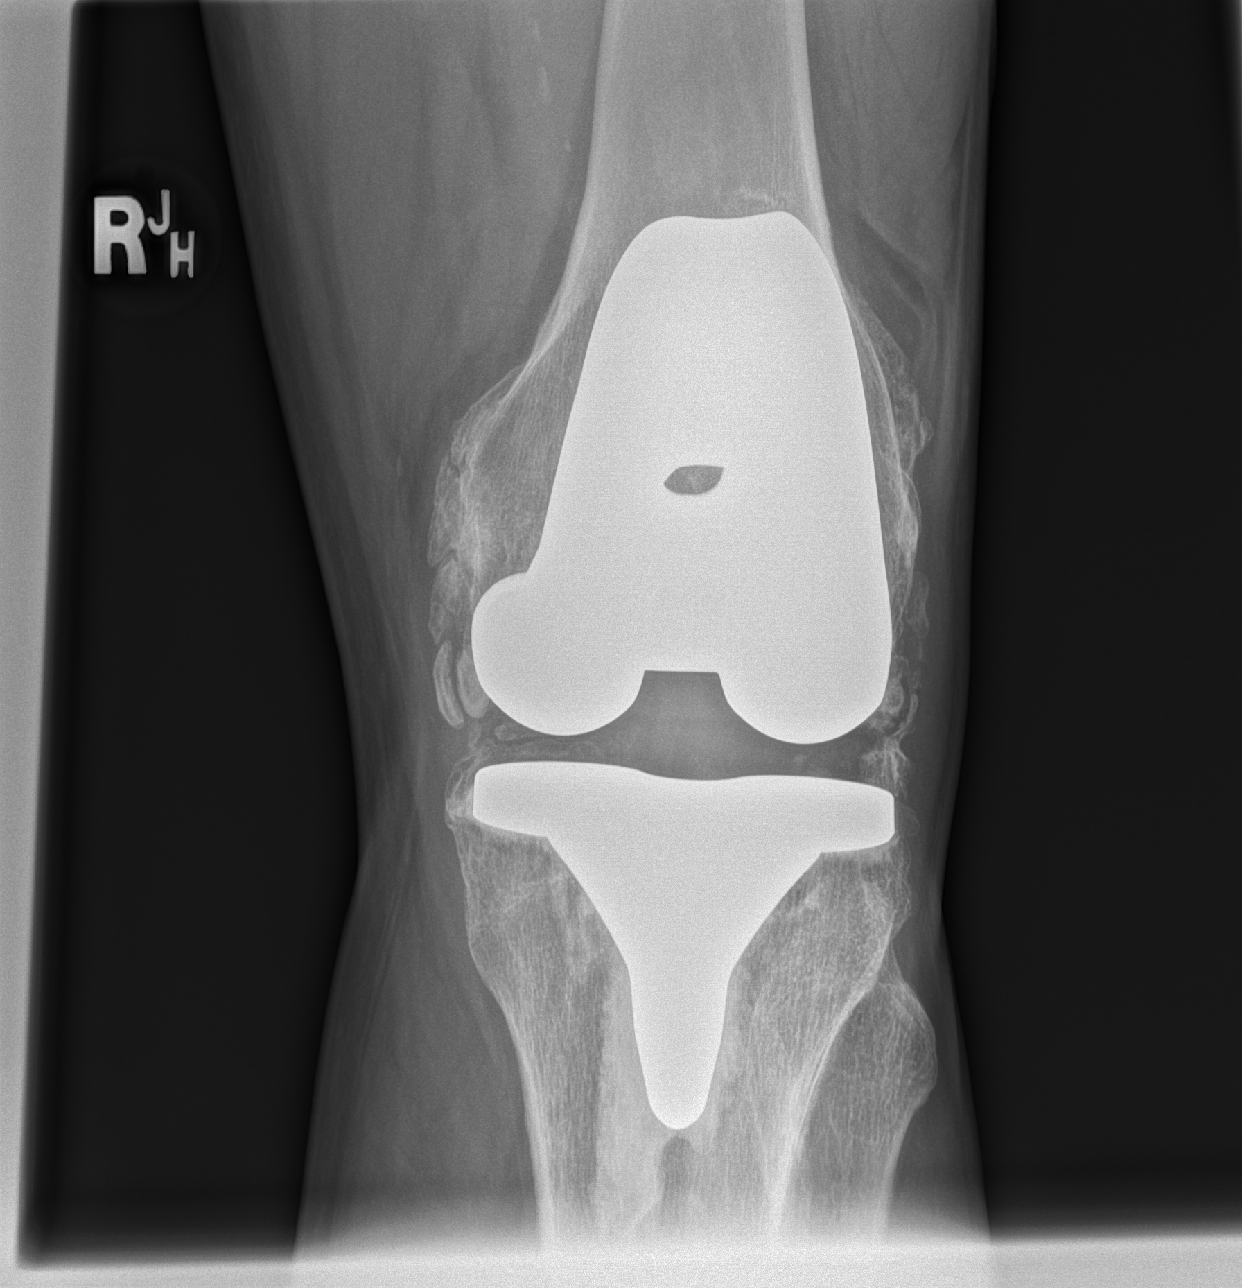

[x knee sunrise right]
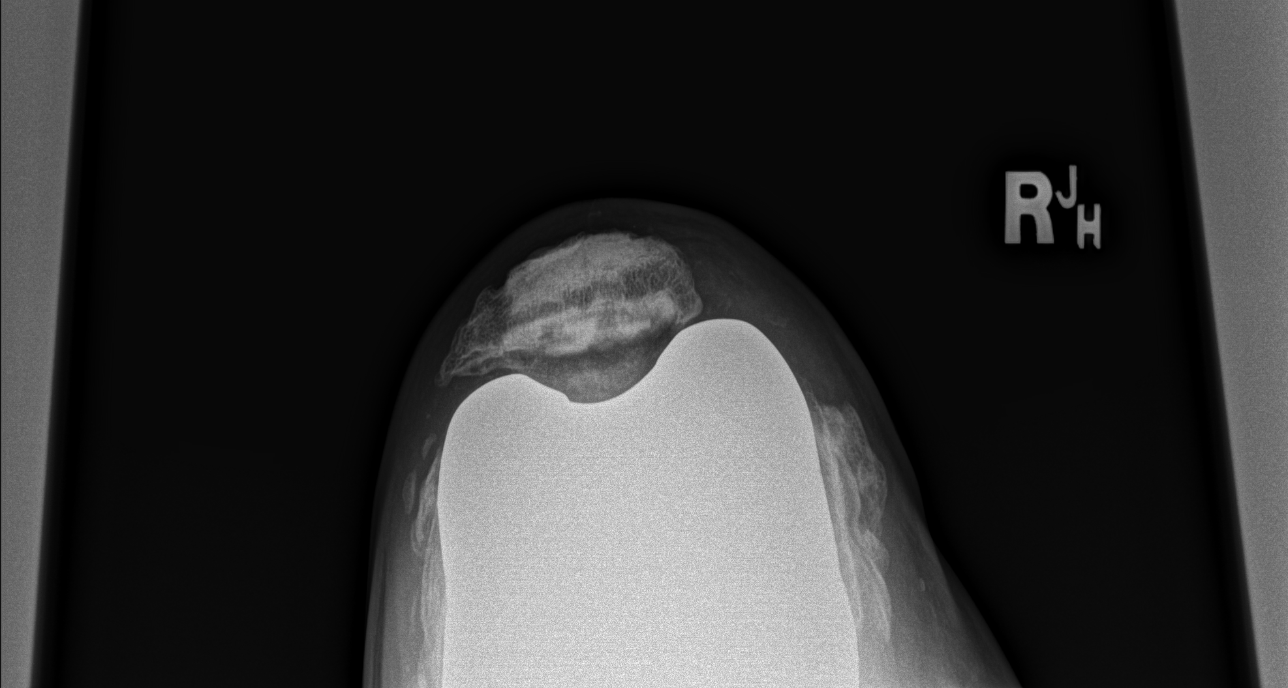

[4 of 4 positions shown; findings below may reference images not displayed]

FINDINGS: The native bone is subjectively mildly osteopenic. The interface of
the native bone with prosthetic components appears good. Positioning
of the prosthetic devices appears stable. There is no joint
effusion.
IMPRESSION: No acute abnormality of the native bone nor of the prosthesis.

## 2020-06-05 ENCOUNTER — Encounter: Payer: Self-pay | Admitting: Family Medicine

## 2020-06-05 ENCOUNTER — Ambulatory Visit (HOSPITAL_BASED_OUTPATIENT_CLINIC_OR_DEPARTMENT_OTHER)
Admission: RE | Admit: 2020-06-05 | Discharge: 2020-06-05 | Disposition: A | Discharge status: Home / Self Care | Payer: Medicare Other | Source: Ambulatory Visit | Attending: Family Medicine | Admitting: Family Medicine

## 2020-06-05 ENCOUNTER — Other Ambulatory Visit: Payer: Self-pay

## 2020-06-05 ENCOUNTER — Ambulatory Visit: Payer: Medicare Other | Admitting: Family Medicine

## 2020-06-05 VITALS — BP 124/67 | HR 69 | Ht 68.0 in | Wt 160.0 lb

## 2020-06-05 DIAGNOSIS — S52571A Other intraarticular fracture of lower end of right radius, initial encounter for closed fracture: Secondary | ICD-10-CM | POA: Insufficient documentation

## 2020-06-05 DIAGNOSIS — S52591A Other fractures of lower end of right radius, initial encounter for closed fracture: Secondary | ICD-10-CM | POA: Diagnosis not present

## 2020-06-05 NOTE — Patient Instructions (Signed)
Good to see you Please use the brace when you are active or sometimes at night if painful.  Please try ice as needed.  Please try the range of motion movements  I will call with the xray results from today  Please send me a message in MyChart with any questions or updates.  Please see me back in 4 weeks.   --Dr. Raeford Razor

## 2020-06-05 NOTE — Assessment & Plan Note (Signed)
Injury occurred on 5/17.  Has some limitation with his range of motion. -Counseled on home exercise therapy and supportive care. -X-ray. -Wrist brace. -Follow-up in 4 weeks.

## 2020-06-05 NOTE — Progress Notes (Signed)
Tanner Griffin - 79 y.o. male MRN [id number]  Date of birth: 26-May-1941  SUBJECTIVE:  Including CC & ROS.  Chief Complaint  Patient presents with  . Wrist Injury    right x 1 month    Tanner Griffin is a 79 y.o. male that is presenting with right wrist pain.  He had a fall onto his outstretched hand about a month ago.  Since that time he has had swelling and pain of the distal radius.  Has had some limitation with his range of motion..  Independent review of the right wrist x-ray from 5/17 shows a mildly displaced distal radius fracture that is intra-articular   Review of Systems See HPI   HISTORY: Past Medical, Surgical, Social, and Family History Reviewed & Updated per EMR.   Pertinent Historical Findings include:  Past Medical History:  Diagnosis Date  . Arthritis   . Arthrofibrosis of total knee arthroplasty, right 03/26/2014  . Constipation    takes Miralax daily as needed  . Headache    occasional  . HYPERLIPIDEMIA    takes Lipitor daily  . HYPERTENSION    takes Amlodipine and Labetalol daily  . Joint pain   . Joint swelling   . Nocturia   . Osteoarthritis of left knee 03/26/2014  . Pneumonia    hx of-as a child  . Prostate cancer ([place])   . Urinary frequency    takes Flomax daily  . Wears eyeglasses   . Wears partial dentures    top     Past Surgical History:  Procedure Laterality Date  . COLONOSCOPY    . HERNIA REPAIR     inguinal 04/14/17  . INGUINAL HERNIA REPAIR Right 04/14/2017   Procedure: RIGHT INGUINAL HERNIA REPAIR WITH MESH;  Surgeon: Armandina Gemma, MD;  [facility];  Service: General;  Laterality: Right;  . INSERTION OF MESH Right 04/14/2017   Procedure: INSERTION OF MESH;  Surgeon: Armandina Gemma, MD;  [facility];  Service: General;  Laterality: Right;  . KNEE CLOSED REDUCTION Right 03/26/2014   Procedure: CLOSED MANIPULATION KNEE;  Surgeon: [name], MD;  Location: [place];  Service: Orthopedics;  Laterality: Right;  . RADIOACTIVE SEED  IMPLANT N/A 06/15/2019   Procedure: RADIOACTIVE SEED IMPLANT/BRACHYTHERAPY IMPLANT;  Surgeon: [name], MD;  Location: [facility];  Service: Urology;  Laterality: N/A;  . SPACE OAR INSTILLATION N/A 06/15/2019   Procedure: SPACE OAR INSTILLATION;  Surgeon: [name], MD;  Location: [facility];  Service: Urology;  Laterality: N/A;  . TOTAL KNEE ARTHROPLASTY  2011   right, Dr. [name] per pt  . TOTAL KNEE ARTHROPLASTY Left 03/26/2014   DR LANDAU  . TOTAL KNEE ARTHROPLASTY Left 03/26/2014   Procedure: TOTAL KNEE ARTHROPLASTY;  Surgeon: [name], MD;  Location: [place];  Service: Orthopedics;  Laterality: Left;    Family History  Problem Relation Age of Onset  . Brain cancer Father   . Renal Disease Brother        ESRD, unknown cause  . Stomach cancer Neg Hx   . Esophageal cancer Neg Hx   . Rectal cancer Neg Hx   . Breast cancer Neg Hx   . Colon cancer Neg Hx   . Pancreatic cancer Neg Hx     Social History   Socioeconomic History  . Marital status: Married    Spouse name: Mary  . Number of children: 5  . Years of education: 9th  . Highest  education level: Not on file  Occupational History  . Occupation: retired    Comment: Architect  Tobacco Use  . Smoking status: Never Smoker  . Smokeless tobacco: Never Used  Vaping Use  . Vaping Use: Never used  Substance and Sexual Activity  . Alcohol use: No    Alcohol/week: 0.0 standard drinks  . Drug use: No  . Sexual activity: Not Currently  Other Topics Concern  . Not on file  Social History Narrative   Married, together 29 years in 2016. 1 son, 2 daughters. Can't count # grandkids at least 10, 11. 1 great-granddaughter.   Resides in [place], on [address].   Grandson and daughter live with them.   Retired from Architect work.   Hobbies: time with grand kids-play basketball.   Social Determinants of Health   Financial Resource Strain: Low Risk   . Difficulty of Paying  Living Expenses: Not hard at all  Food Insecurity:   . Worried About Charity fundraiser in the Last Year:   . Arboriculturist in the Last Year:   Transportation Needs:   . Film/video editor (Medical):   [name] Lack of Transportation (Non-Medical):   Physical Activity: Inactive  . Days of Exercise per Week: 0 days  . Minutes of Exercise per Session: 0 min  Stress:   . Feeling of Stress :   Social Connections:   . Frequency of Communication with Friends and Family:   . Frequency of Social Gatherings with Friends and Family:   . Attends Religious Services:   . Active Member of Clubs or Organizations:   . Attends Archivist Meetings:   [name] Marital Status:   Intimate Partner Violence:   . Fear of Current or Ex-Partner:   . Emotionally Abused:   [name] Physically Abused:   . Sexually Abused:      PHYSICAL EXAM:  VS: BP 124/67   Pulse 69   Ht 5\' 8"  (1.727 m)   Wt 160 lb (72.6 kg)   BMI 24.33 kg/m  Physical Exam Gen: NAD, alert, cooperative with exam, well-appearing MSK:  Right wrist: Swelling of the distal radius. Tenderness palpation over the distal radius. Limited wrist flexion extension. Normal grip strength. Neurovascular intact     ASSESSMENT & PLAN:   Distal radius fracture, right Injury occurred on 5/17.  Has some limitation with his range of motion. -Counseled on home exercise therapy and supportive care. -X-ray. -Wrist brace. -Follow-up in 4 weeks.

## 2020-06-06 ENCOUNTER — Telehealth: Payer: Self-pay | Admitting: Family Medicine

## 2020-06-06 NOTE — Telephone Encounter (Signed)
Informed of results.   Rosemarie Ax, MD [facility] 06/06/2020, 9:52 AM

## 2020-06-18 DIAGNOSIS — T8484XD Pain due to internal orthopedic prosthetic devices, implants and grafts, subsequent encounter: Secondary | ICD-10-CM | POA: Diagnosis not present

## 2020-06-19 DIAGNOSIS — R3915 Urgency of urination: Secondary | ICD-10-CM | POA: Diagnosis not present

## 2020-07-02 ENCOUNTER — Encounter: Payer: Self-pay | Admitting: Family Medicine

## 2020-07-02 ENCOUNTER — Ambulatory Visit (INDEPENDENT_AMBULATORY_CARE_PROVIDER_SITE_OTHER): Payer: Medicare Other | Admitting: Family Medicine

## 2020-07-02 ENCOUNTER — Other Ambulatory Visit: Payer: Self-pay

## 2020-07-02 VITALS — BP 132/70 | HR 69 | Temp 97.8°F | Ht 68.0 in | Wt 163.6 lb

## 2020-07-02 DIAGNOSIS — D539 Nutritional anemia, unspecified: Secondary | ICD-10-CM

## 2020-07-02 DIAGNOSIS — M889 Osteitis deformans of unspecified bone: Secondary | ICD-10-CM

## 2020-07-02 DIAGNOSIS — I10 Essential (primary) hypertension: Secondary | ICD-10-CM | POA: Diagnosis not present

## 2020-07-02 MED ORDER — ALENDRONATE SODIUM 70 MG PO TABS: 70.0000 mg | ORAL_TABLET | ORAL | 4 refills | Status: DC

## 2020-07-02 NOTE — Progress Notes (Signed)
Established Patient Office Visit  Subjective:  Patient ID: Tanner Griffin, male    DOB: 1941-05-31  Age: 79 y.o. MRN: [id number]  CC:  Chief Complaint  Patient presents with   Follow-up    3 month follow up    HPI [name] presents for hypertension and Paget's disease.  Blood pressures well controlled with amlodipine.  Continues to have difficulty obtaining the Fosamax.  He would like for the Fosamax to be sent to [facility] on the [address] of [address]. continues follow-up with orthopedics for right knee arthrosis.  Currently using a 4 post walker.  Past Medical History:  Diagnosis Date   Arthritis    Arthrofibrosis of total knee arthroplasty, right 03/26/2014   Constipation    takes Miralax daily as needed   Headache    occasional   HYPERLIPIDEMIA    takes Lipitor daily   HYPERTENSION    takes Amlodipine and Labetalol daily   Joint pain    Joint swelling    Nocturia    Osteoarthritis of left knee 03/26/2014   Pneumonia    hx of-as a child   Prostate cancer ([place])    Urinary frequency    takes Flomax daily   Wears eyeglasses    Wears partial dentures    top     Past Surgical History:  Procedure Laterality Date   COLONOSCOPY     HERNIA REPAIR     inguinal 04/14/17   INGUINAL HERNIA REPAIR Right 04/14/2017   Procedure: RIGHT INGUINAL HERNIA REPAIR WITH MESH;  Surgeon: Armandina Gemma, MD;  [facility];  Service: General;  Laterality: Right;   INSERTION OF MESH Right 04/14/2017   Procedure: INSERTION OF MESH;  Surgeon: Armandina Gemma, MD;  [facility];  Service: General;  Laterality: Right;   KNEE CLOSED REDUCTION Right 03/26/2014   Procedure: CLOSED MANIPULATION KNEE;  Surgeon: [name], MD;  Location: [place];  Service: Orthopedics;  Laterality: Right;   RADIOACTIVE SEED IMPLANT N/A 06/15/2019   Procedure: RADIOACTIVE SEED IMPLANT/BRACHYTHERAPY IMPLANT;  Surgeon: [name], MD;  Location: [place];   Service: Urology;  Laterality: N/A;   SPACE OAR INSTILLATION N/A 06/15/2019   Procedure: SPACE OAR INSTILLATION;  Surgeon: [name], MD;  Location: [facility];  Service: Urology;  Laterality: N/A;   TOTAL KNEE ARTHROPLASTY  2011   right, Dr. [name] per pt   TOTAL KNEE ARTHROPLASTY Left 03/26/2014   DR LANDAU   TOTAL KNEE ARTHROPLASTY Left 03/26/2014   Procedure: TOTAL KNEE ARTHROPLASTY;  Surgeon: [name], MD;  Location: [place];  Service: Orthopedics;  Laterality: Left;    Family History  Problem Relation Age of Onset   Brain cancer Father    Renal Disease Brother        ESRD, unknown cause   Stomach cancer Neg Hx    Esophageal cancer Neg Hx    Rectal cancer Neg Hx    Breast cancer Neg Hx    Colon cancer Neg Hx    Pancreatic cancer Neg Hx     Social History   Socioeconomic History   Marital status: Married    Spouse name: Lamont   Number of children: 5   Years of education: 9th   Highest education level: Not on file  Occupational History   Occupation: retired    Comment: Architect  Tobacco Use   Smoking status: Never Smoker   Smokeless tobacco: Never Used  Electronics engineer  Use   Vaping Use: Never used  Substance and Sexual Activity   Alcohol use: No    Alcohol/week: 0.0 standard drinks   Drug use: No   Sexual activity: Not Currently  Other Topics Concern   Not on file  Social History Narrative   Married, together 43 years in 2016. 1 son, 2 daughters. Can't count # grandkids at least 10, 11. 1 great-granddaughter.   Resides in [place], on [address].   Grandson and daughter live with them.   Retired from Architect work.   Hobbies: time with grand kids-play basketball.   Social Determinants of Health   Financial Resource Strain: Low Risk    Difficulty of Paying Living Expenses: Not hard at all  Food Insecurity:    Worried About Charity fundraiser in the Last Year:    Arboriculturist in the Last Year:     Transportation Needs:    Film/video editor (Medical):    Lack of Transportation (Non-Medical):   Physical Activity: Inactive   Days of Exercise per Week: 0 days   Minutes of Exercise per Session: 0 min  Stress:    Feeling of Stress :   Social Connections:    Frequency of Communication with Friends and Family:    Frequency of Social Gatherings with Friends and Family:    Attends Religious Services:    Active Member of Clubs or Organizations:    Attends Music therapist:    Marital Status:   Intimate Partner Violence:    Fear of Current or Ex-Partner:    Emotionally Abused:    Physically Abused:    Sexually Abused:     Outpatient Medications Prior to Visit  Medication Sig Dispense Refill   amLODipine (NORVASC) 10 MG tablet TAKE 1 TABLET BY MOUTH  DAILY 90 tablet 1   aspirin EC 81 MG tablet Take 81 mg by mouth daily.     atorvastatin (LIPITOR) 40 MG tablet TAKE 1 TABLET BY MOUTH  DAILY 90 tablet 1   lidocaine (LIDODERM) 5 % Place 1 patch onto the skin daily. Remove & Discard patch within 12 hours or as directed by MD 30 patch 0   tamsulosin (FLOMAX) 0.4 MG CAPS capsule Take 1 capsule (0.4 mg total) by mouth daily after supper. 90 capsule 0   alendronate (FOSAMAX) 70 MG tablet Take 1 tablet (70 mg total) by mouth every 7 (seven) days. Take with a full glass of water on an empty stomach. 12 tablet 4   meloxicam (MOBIC) 7.5 MG tablet Take 1 tablet (7.5 mg total) by mouth daily. (Patient not taking: Reported on 05/30/2020) 30 tablet 0   methocarbamol (ROBAXIN) 500 MG tablet Take 1 tablet (500 mg total) by mouth every 8 (eight) hours as needed for muscle spasms. (Patient not taking: Reported on 05/30/2020) 30 tablet 1   oxybutynin (DITROPAN XL) 15 MG 24 hr tablet Take 15 mg by mouth daily. (Patient not taking: Reported on 07/02/2020)     polyethylene glycol powder (GLYCOLAX/MIRALAX) powder Take 17 g by mouth 2 (two) times daily as needed. (Patient  not taking: Reported on 07/02/2020) 3350 g 1   No facility-administered medications prior to visit.    Allergies  Allergen Reactions   Telmisartan Other (See Comments)    Reaction:  Headache     ROS Review of Systems  Constitutional: Negative.   HENT: Negative.   Respiratory: Negative.   Cardiovascular: Negative.   Musculoskeletal: Positive for arthralgias and gait  problem.  Neurological: Negative for tremors and speech difficulty.  Hematological: Does not bruise/bleed easily.  Psychiatric/Behavioral: Negative.       Objective:    Physical Exam Vitals and nursing note reviewed.  Constitutional:      General: He is not in acute distress.    Appearance: Normal appearance. He is normal weight. He is not ill-appearing, toxic-appearing or diaphoretic.  HENT:     Head: Normocephalic and atraumatic.     Right Ear: External ear normal.     Left Ear: External ear normal.  Eyes:     General: No scleral icterus.       Right eye: No discharge.        Left eye: No discharge.     Conjunctiva/sclera: Conjunctivae normal.  Cardiovascular:     Rate and Rhythm: Normal rate and regular rhythm.  Pulmonary:     Effort: Pulmonary effort is normal.     Breath sounds: Normal breath sounds.  Abdominal:     General: Bowel sounds are normal.  Musculoskeletal:       Legs:  Skin:    General: Skin is warm and dry.  Neurological:     Mental Status: He is alert.  Psychiatric:        Mood and Affect: Mood normal.        Behavior: Behavior normal.     BP 132/70    Pulse 69    Temp 97.8 F (36.6 C) (Tympanic)    Ht '5\' 8"'$  (1.727 m)    Wt 163 lb 9.6 oz (74.2 kg)    SpO2 96%    BMI 24.88 kg/m  Wt Readings from Last 3 Encounters:  07/02/20 163 lb 9.6 oz (74.2 kg)  06/05/20 160 lb (72.6 kg)  05/30/20 158 lb (71.7 kg)     Health Maintenance Due  Topic Date Due   Hepatitis C Screening  Never done   COVID-19 Vaccine (1) Never done   FOOT EXAM  10/16/2016   OPHTHALMOLOGY EXAM   11/18/2016   HEMOGLOBIN A1C  09/15/2018   URINE MICROALBUMIN  03/16/2019   TETANUS/TDAP  07/04/2019    There are no preventive care reminders to display for this patient.  Lab Results  Component Value Date   TSH 1.03 03/15/2018   Lab Results  Component Value Date   WBC 4.5 04/23/2020   HGB 11.9 (L) 04/23/2020   HCT 36.3 (L) 04/23/2020   MCV 100.0 04/23/2020   PLT 253 04/23/2020   Lab Results  Component Value Date   NA 142 04/23/2020   K 4.1 04/23/2020   CO2 24 04/23/2020   GLUCOSE 103 (H) 04/23/2020   BUN 10 04/23/2020   CREATININE 0.94 04/23/2020   BILITOT 1.0 03/31/2020   ALKPHOS 149 (H) 03/31/2020   AST 25 03/31/2020   ALT 21 03/31/2020   PROT 6.8 03/31/2020   ALBUMIN 4.2 03/31/2020   CALCIUM 9.4 04/23/2020   ANIONGAP 9 04/23/2020   GFR 113.42 03/31/2020   Lab Results  Component Value Date   CHOL 139 03/15/2018   Lab Results  Component Value Date   HDL 46.30 03/15/2018   Lab Results  Component Value Date   LDLCALC 77 03/15/2018   Lab Results  Component Value Date   TRIG 77.0 03/15/2018   Lab Results  Component Value Date   CHOLHDL 3 03/15/2018   Lab Results  Component Value Date   HGBA1C 5.7 03/15/2018      Assessment & Plan:   Problem List  Items Addressed This Visit      Cardiovascular and Mediastinum   Essential hypertension     Musculoskeletal and Integument   Paget's disease of the bone - Primary   Relevant Medications   alendronate (FOSAMAX) 70 MG tablet     Other   Macrocytic anemia      Meds ordered this encounter  Medications   alendronate (FOSAMAX) 70 MG tablet    Sig: Take 1 tablet (70 mg total) by mouth every 7 (seven) days. Take with a full glass of water on an empty stomach.    Dispense:  12 tablet    Refill:  4    Follow-up: Return in about 3 months (around 10/02/2020).   Continue Norvasc for blood pressure please start Fosamax for Paget's disease.  Double check the pharmacy with him.  Recheck CBC, Alk  phos in 3 months. Libby Maw, MD

## 2020-07-03 ENCOUNTER — Ambulatory Visit: Payer: Medicare Other | Admitting: Family Medicine

## 2020-07-03 NOTE — Progress Notes (Deleted)
Tanner Griffin - 79 y.o. male MRN [id number]  Date of birth: 1941/06/21  SUBJECTIVE:  Including CC & ROS.  No chief complaint on file.   Tanner Griffin is a 79 y.o. male that is  ***.  ***   Review of Systems See HPI   HISTORY: Past Medical, Surgical, Social, and Family History Reviewed & Updated per EMR.   Pertinent Historical Findings include:  Past Medical History:  Diagnosis Date   Arthritis    Arthrofibrosis of total knee arthroplasty, right 03/26/2014   Constipation    takes Miralax daily as needed   Headache    occasional   HYPERLIPIDEMIA    takes Lipitor daily   HYPERTENSION    takes Amlodipine and Labetalol daily   Joint pain    Joint swelling    Nocturia    Osteoarthritis of left knee 03/26/2014   Pneumonia    hx of-as a child   Prostate cancer ([place])    Urinary frequency    takes Flomax daily   Wears eyeglasses    Wears partial dentures    top     Past Surgical History:  Procedure Laterality Date   COLONOSCOPY     HERNIA REPAIR     inguinal 04/14/17   INGUINAL HERNIA REPAIR Right 04/14/2017   Procedure: RIGHT INGUINAL HERNIA REPAIR WITH MESH;  Surgeon: Armandina Gemma, MD;  [facility];  Service: General;  Laterality: Right;   INSERTION OF MESH Right 04/14/2017   Procedure: INSERTION OF MESH;  Surgeon: Armandina Gemma, MD;  [facility];  Service: General;  Laterality: Right;   KNEE CLOSED REDUCTION Right 03/26/2014   Procedure: CLOSED MANIPULATION KNEE;  Surgeon: [name], MD;  Location: [place];  Service: Orthopedics;  Laterality: Right;   RADIOACTIVE SEED IMPLANT N/A 06/15/2019   Procedure: RADIOACTIVE SEED IMPLANT/BRACHYTHERAPY IMPLANT;  Surgeon: [name], MD;  Location: [place];  Service: Urology;  Laterality: N/A;   SPACE OAR INSTILLATION N/A 06/15/2019   Procedure: SPACE OAR INSTILLATION;  Surgeon: [name], MD;  Location: [facility];  Service: Urology;  Laterality: N/A;   TOTAL  KNEE ARTHROPLASTY  2011   right, Dr. [name] per pt   TOTAL KNEE ARTHROPLASTY Left 03/26/2014   DR LANDAU   TOTAL KNEE ARTHROPLASTY Left 03/26/2014   Procedure: TOTAL KNEE ARTHROPLASTY;  Surgeon: [name], MD;  Location: [place];  Service: Orthopedics;  Laterality: Left;    Family History  Problem Relation Age of Onset   Brain cancer Father    Renal Disease Brother        ESRD, unknown cause   Stomach cancer Neg Hx    Esophageal cancer Neg Hx    Rectal cancer Neg Hx    Breast cancer Neg Hx    Colon cancer Neg Hx    Pancreatic cancer Neg Hx     Social History   Socioeconomic History   Marital status: Married    Spouse name: Grayson   Number of children: 5   Years of education: 9th   Highest education level: Not on file  Occupational History   Occupation: retired    Comment: Architect  Tobacco Use   Smoking status: Never Smoker   Smokeless tobacco: Never Used  Scientific laboratory technician Use: Never used  Substance and Sexual Activity   Alcohol use: No    Alcohol/week: 0.0 standard drinks   Drug use: No   Sexual activity: Not Currently  Other  Topics Concern   Not on file  Social History Narrative   Married, together 34 years in 2016. 1 son, 2 daughters. Can't count # grandkids at least 10, 11. 1 great-granddaughter.   Resides in [place], on [address].   Grandson and daughter live with them.   Retired from Architect work.   Hobbies: time with grand kids-play basketball.   Social Determinants of Health   Financial Resource Strain: Low Risk    Difficulty of Paying Living Expenses: Not hard at all  Food Insecurity:    Worried About Charity fundraiser in the Last Year:    Arboriculturist in the Last Year:   Transportation Needs:    Film/video editor (Medical):    Lack of Transportation (Non-Medical):   Physical Activity: Inactive   Days of Exercise per Week: 0 days   Minutes of Exercise per Session: 0 min  Stress:     Feeling of Stress :   Social Connections:    Frequency of Communication with Friends and Family:    Frequency of Social Gatherings with Friends and Family:    Attends Religious Services:    Active Member of Clubs or Organizations:    Attends Music therapist:    Marital Status:   Intimate Partner Violence:    Fear of Current or Ex-Partner:    Emotionally Abused:    Physically Abused:    Sexually Abused:      PHYSICAL EXAM:  VS: There were no vitals taken for this visit. Physical Exam Gen: NAD, alert, cooperative with exam, well-appearing MSK:  ***      ASSESSMENT & PLAN:   No problem-specific Assessment & Plan notes found for this encounter.

## 2020-07-29 ENCOUNTER — Telehealth: Payer: Self-pay

## 2020-07-29 NOTE — Progress Notes (Signed)
Spoke with patient to confirmed telephone appointment on 07/30/2020 at 3:30 PM  with Junius Argyle the Clinical pharmacist.   Patient Verbalized understanding  [place] Pharmacist Assistant [phone]

## 2020-07-30 ENCOUNTER — Telehealth: Payer: Medicare Other

## 2020-07-30 ENCOUNTER — Ambulatory Visit: Payer: Medicare Other

## 2020-07-30 DIAGNOSIS — I10 Essential (primary) hypertension: Secondary | ICD-10-CM

## 2020-07-30 DIAGNOSIS — E785 Hyperlipidemia, unspecified: Secondary | ICD-10-CM
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
## 2020-08-01 DIAGNOSIS — R3915 Urgency of urination: Secondary | ICD-10-CM | POA: Diagnosis not present

## 2020-08-01 NOTE — Patient Instructions (Signed)
Visit Information It was great speaking with you today!  Please let me know if you have any questions about our visit. Goals Addressed            This Visit's Progress    Chronic Care Management   On track    CARE PLAN ENTRY  Current Barriers:   Chronic Disease Management support, education, and care coordination needs related to Hypertension and Hyperlipidemia   Hypertension  Pharmacist Clinical Goal(s): o Over the next 30 days, patient will work with PharmD and providers to maintain BP goal <140/90  Current regimen:  o Amlodipine 10 mg   Patient self care activities - Over the next 30 days, patient will: o Check blood pressure 2-3 times weekly, document, and provide at future appointments o Ensure daily salt intake < 2300 mg/day  Hyperlipidemia  Pharmacist Clinical Goal(s): o Over the next 90 days, patient will work with PharmD and providers to maintain LDL goal < 100  Current regimen:  o Atorvastatin 40 mg   Medication management  Pharmacist Clinical Goal(s): o Over the next 90 days, patient will work with PharmD and providers to achieve optimal medication adherence  Current pharmacy: [facility]  Interventions o Comprehensive medication review performed. o Continue current medication management strategy  Patient self care activities - Over the next 90 days, patient will: o Take medications as prescribed o Report any questions or concerns to PharmD and/or provider(s)        The patient verbalized understanding of instructions provided today and agreed to receive a mailed copy of patient instruction and/or educational materials.  Telephone follow up appointment with pharmacy team member scheduled for: 07/27/21 at 1:00 PM  [facility] at [facility]  [phone]

## 2020-08-27 ENCOUNTER — Telehealth: Payer: Self-pay | Admitting: Family Medicine

## 2020-08-27 ENCOUNTER — Other Ambulatory Visit: Payer: Self-pay

## 2020-08-27 DIAGNOSIS — M889 Osteitis deformans of unspecified bone: Secondary | ICD-10-CM

## 2020-08-27 MED ORDER — ALENDRONATE SODIUM 70 MG PO TABS: 70.0000 mg | ORAL_TABLET | ORAL | 4 refills | Status: DC

## 2020-08-27 NOTE — Telephone Encounter (Signed)
Patients wife [name] is calling to get a refill on patients Alendronate 70mg . If approved, please send to [facility] on [address] and call her at [phone] to let her know it has been sent in.

## 2020-08-27 NOTE — Telephone Encounter (Signed)
Refill request for pending medication, last office visit 07/02/20. Please advise.

## 2020-08-28 ENCOUNTER — Telehealth: Payer: Self-pay | Admitting: Family Medicine

## 2020-08-28 NOTE — Telephone Encounter (Signed)
Patient spouse is calling and requesting a refill for Oxybutynin sent to [facility], please advise. CB is [phone]

## 2020-08-28 NOTE — Telephone Encounter (Signed)
Patient aware that Rx sent in

## 2020-08-29 NOTE — Telephone Encounter (Signed)
Awaiting call back for Tanner Griffin or is wife with which medication patient is needing a refill on.

## 2020-08-29 NOTE — Telephone Encounter (Signed)
Called and spoke with both patient and his wife, calling to see if patient is currently taking the Oxybutynin due to last visit patient stated that he was no taking this medication. Neither of which was sure if patient was taking medication they will check and let us know.

## 2020-08-29 NOTE — Telephone Encounter (Signed)
Not sure that I understand why they are asking for refill if they are not sure he is taking it?

## 2020-09-01 DIAGNOSIS — R3914 Feeling of incomplete bladder emptying: Secondary | ICD-10-CM | POA: Diagnosis not present

## 2020-09-04 NOTE — Telephone Encounter (Signed)
Requested Rx was not prescribed by Dr. Ethelene Hal per wife the medication was prescribed by patient old doctor that retired. Appointment scheduled for follow up on medication.

## 2020-09-04 NOTE — Telephone Encounter (Signed)
Patients wife is returning a call to the office regarding patients Oxybutynin. Please give her a call back at [phone].

## 2020-09-09 ENCOUNTER — Ambulatory Visit: Payer: Medicare Other | Admitting: Family Medicine

## 2020-09-11 ENCOUNTER — Ambulatory Visit: Payer: Medicare Other | Admitting: Family Medicine

## 2020-09-23 ENCOUNTER — Ambulatory Visit (INDEPENDENT_AMBULATORY_CARE_PROVIDER_SITE_OTHER): Payer: Medicare Other | Admitting: Nurse Practitioner

## 2020-09-23 ENCOUNTER — Encounter: Payer: Self-pay | Admitting: Nurse Practitioner

## 2020-09-23 DIAGNOSIS — M25561 Pain in right knee: Secondary | ICD-10-CM

## 2020-09-23 DIAGNOSIS — G8929 Other chronic pain: Secondary | ICD-10-CM

## 2020-09-23 MED ORDER — MELOXICAM 7.5 MG PO TABS: 7.5000 mg | ORAL_TABLET | Freq: Every day | ORAL | 0 refills | Status: DC

## 2020-09-23 NOTE — Patient Instructions (Signed)
Start meloxicam. Schedule f/up with pcp to discuss chronic pain management.

## 2020-09-23 NOTE — Progress Notes (Signed)
Virtual Visit via Telephone Note  I connected with Tanner Griffin on 09/23/20 at 11:00 AM EDT by telephone and verified that I am speaking with the correct person using two identifiers.   Location: Patient:home Provider:office Participants: patient, wife, and provider  I discussed the limitations, risks, security and privacy concerns of performing an evaluation and management service by telephone and the availability of in person appointments. I also discussed with the patient that there may be a patient responsible charge related to this service. The patient expressed understanding and agreed to proceed.  CC: chronic right knee pain Pt c/o right knee pain x2 weeks, pt states he fell last friday but the pain started before his fall.   History of Present Illness: Patient and his wife decline any injury from recent fall. Tanner Griffin reports ongoing right knee pain for several years, onset after knee replacement surgery. Worse in last 2weeks. Pain is constant, worse with weight-bearing, and limited ROM since surgery. He denies any swelling or redness. No change in gait. No improvement with OTC ibuprofen. He did not get meloxicam previous ly prescribed by pcp. Patient's wife declined video and face to face appt.   Observations/Objective: Unable to eval due to telephone appt Alert and oriented, normal speech  Assessment and Plan: Tanner Griffin was seen today for acute visit.  Diagnoses and all orders for this visit:  Chronic pain of right knee -     meloxicam (MOBIC) 7.5 MG tablet; Take 1 tablet (7.5 mg total) by mouth daily.   Follow Up Instructions: Start meloxicam. Schedule f/up with pcp to discuss chronic pain management.   I discussed the assessment and treatment plan with the patient. The patient was provided an opportunity to ask questions and all were answered. The patient agreed with the plan and demonstrated an understanding of the instructions.   The patient was advised to call  back or seek an in-person evaluation if the symptoms worsen or if the condition fails to improve as anticipated.  I provided 10 minutes of non-face-to-face time during this encounter.  Wilfred Lacy, NP

## 2020-10-02 ENCOUNTER — Ambulatory Visit: Payer: Medicare Other | Admitting: Family Medicine

## 2020-10-02 ENCOUNTER — Other Ambulatory Visit: Payer: Self-pay | Admitting: Family Medicine

## 2020-10-02 DIAGNOSIS — E785 Hyperlipidemia, unspecified: Secondary | ICD-10-CM

## 2020-10-02 DIAGNOSIS — I152 Hypertension secondary to endocrine disorders: Secondary | ICD-10-CM

## 2020-10-02 DIAGNOSIS — E1159 Type 2 diabetes mellitus with other circulatory complications: Secondary | ICD-10-CM

## 2020-10-06 ENCOUNTER — Other Ambulatory Visit: Payer: Self-pay

## 2020-10-06 ENCOUNTER — Telehealth: Payer: Self-pay

## 2020-10-06 DIAGNOSIS — E785 Hyperlipidemia, unspecified: Secondary | ICD-10-CM

## 2020-10-06 DIAGNOSIS — E1159 Type 2 diabetes mellitus with other circulatory complications: Secondary | ICD-10-CM

## 2020-10-06 MED ORDER — ATORVASTATIN CALCIUM 40 MG PO TABS: 40.0000 mg | ORAL_TABLET | Freq: Every day | ORAL | 0 refills | Status: DC

## 2020-10-06 MED ORDER — AMLODIPINE BESYLATE 10 MG PO TABS: 10.0000 mg | ORAL_TABLET | Freq: Every day | ORAL | 0 refills | Status: DC

## 2020-10-06 NOTE — Progress Notes (Signed)
I have attempted without success to contact this patient by phone three times to do his Hypertension  Disease State call. I left a Voice message for patient to return my call.  [place] Pharmacist Assistant [phone]

## 2020-10-06 NOTE — Telephone Encounter (Signed)
Contacted pharmacy to verify they didn't received RX then resent to pharmacy due to E-scribe error on 10/03/20.   Dm/cma

## 2020-10-10 NOTE — Telephone Encounter (Signed)
error 

## 2020-10-14 ENCOUNTER — Ambulatory Visit (INDEPENDENT_AMBULATORY_CARE_PROVIDER_SITE_OTHER): Payer: Medicare Other

## 2020-10-14 ENCOUNTER — Ambulatory Visit: Payer: Medicare Other

## 2020-10-14 ENCOUNTER — Other Ambulatory Visit: Payer: Self-pay

## 2020-10-14 DIAGNOSIS — Z23 Encounter for immunization: Secondary | ICD-10-CM

## 2020-10-14 NOTE — Progress Notes (Signed)
Per orders of Dr Kremer, injection of Influenza given by Yomara Toothman, cma.  Patient tolerated injection well.   

## 2020-10-16 ENCOUNTER — Ambulatory Visit (INDEPENDENT_AMBULATORY_CARE_PROVIDER_SITE_OTHER): Payer: Medicare Other

## 2020-10-16 ENCOUNTER — Telehealth: Payer: Medicare Other | Admitting: Nurse Practitioner

## 2020-10-16 VITALS — Ht 68.0 in | Wt 169.0 lb

## 2020-10-16 DIAGNOSIS — R3914 Feeling of incomplete bladder emptying: Secondary | ICD-10-CM | POA: Diagnosis not present

## 2020-10-16 DIAGNOSIS — Z0000 Encounter for general adult medical examination without abnormal findings: Secondary | ICD-10-CM

## 2020-10-16 NOTE — Patient Instructions (Signed)
[name] , Thank you for taking time to complete your Medicare Wellness Visit. I appreciate your ongoing commitment to your health goals. Please review the following plan we discussed and let me know if I can assist you in the future.   Screening recommendations/referrals: Colonoscopy: No longer required Recommended yearly ophthalmology/optometry visit for glaucoma screening and checkup Recommended yearly dental visit for hygiene and checkup  Vaccinations: Influenza vaccine: Up yo Date Pneumococcal vaccine: Completed vaccines Tdap vaccine: Discuss with pharmacy Shingles vaccine: Discuss with pharmacy  Covid-19: Completed vaccines. Specific dates unknown. Please bring documentation to your next visit.  Advanced directives: Information mailed today.  Conditions/risks identified: See problem list  Next appointment: Follow up in one year for your annual wellness visit.   Preventive Care 14 Years and Older, Male Preventive care refers to lifestyle choices and visits with your health care provider that can promote health and wellness. What does preventive care include?  A yearly physical exam. This is also called an annual well check.  Dental exams once or twice a year.  Routine eye exams. Ask your health care provider how often you should have your eyes checked.  Personal lifestyle choices, including:  Daily care of your teeth and gums.  Regular physical activity.  Eating a healthy diet.  Avoiding tobacco and drug use.  Limiting alcohol use.  Practicing safe sex.  Taking low doses of aspirin every day.  Taking vitamin and mineral supplements as recommended by your health care provider. What happens during an annual well check? The services and screenings done by your health care provider during your annual well check will depend on your age, overall health, lifestyle risk factors, and family history of disease. Counseling  Your health care provider may ask you questions  about your:  Alcohol use.  Tobacco use.  Drug use.  Emotional well-being.  Home and relationship well-being.  Sexual activity.  Eating habits.  History of falls.  Memory and ability to understand (cognition).  Work and work Statistician. Screening  You may have the following tests or measurements:  Height, weight, and BMI.  Blood pressure.  Lipid and cholesterol levels. These may be checked every 5 years, or more frequently if you are over 67 years old.  Skin check.  Lung cancer screening. You may have this screening every year starting at age 56 if you have a 30-pack-year history of smoking and currently smoke or have quit within the past 15 years.  Fecal occult blood test (FOBT) of the stool. You may have this test every year starting at age 9.  Flexible sigmoidoscopy or colonoscopy. You may have a sigmoidoscopy every 5 years or a colonoscopy every 10 years starting at age 40.  Prostate cancer screening. Recommendations will vary depending on your family history and other risks.  Hepatitis C blood test.  Hepatitis B blood test.  Sexually transmitted disease (STD) testing.  Diabetes screening. This is done by checking your blood sugar (glucose) after you have not eaten for a while (fasting). You may have this done every 1-3 years.  Abdominal aortic aneurysm (AAA) screening. You may need this if you are a current or former smoker.  Osteoporosis. You may be screened starting at age 67 if you are at high risk. Talk with your health care provider about your test results, treatment options, and if necessary, the need for more tests. Vaccines  Your health care provider may recommend certain vaccines, such as:  Influenza vaccine. This is recommended every year.  Tetanus,  diphtheria, and acellular pertussis (Tdap, Td) vaccine. You may need a Td booster every 10 years.  Zoster vaccine. You may need this after age 15.  Pneumococcal 13-valent conjugate (PCV13)  vaccine. One dose is recommended after age 3.  Pneumococcal polysaccharide (PPSV23) vaccine. One dose is recommended after age 75. Talk to your health care provider about which screenings and vaccines you need and how often you need them. This information is not intended to replace advice given to you by your health care provider. Make sure you discuss any questions you have with your health care provider. Document Released: 01/02/2016 Document Revised: 08/25/2016 Document Reviewed: 10/07/2015 Elsevier Interactive Patient Education  2017 [employer] Prevention in the Home Falls can cause injuries. They can happen to people of all ages. There are many things you can do to make your home safe and to help prevent falls. What can I do on the outside of my home?  Regularly fix the edges of walkways and driveways and fix any cracks.  Remove anything that might make you trip as you walk through a door, such as a raised step or threshold.  Trim any bushes or trees on the path to your home.  Use bright outdoor lighting.  Clear any walking paths of anything that might make someone trip, such as rocks or tools.  Regularly check to see if handrails are loose or broken. Make sure that both sides of any steps have handrails.  Any raised decks and porches should have guardrails on the edges.  Have any leaves, snow, or ice cleared regularly.  Use sand or salt on walking paths during winter.  Clean up any spills in your garage right away. This includes oil or grease spills. What can I do in the bathroom?  Use night lights.  Install grab bars by the toilet and in the tub and shower. Do not use towel bars as grab bars.  Use non-skid mats or decals in the tub or shower.  If you need to sit down in the shower, use a plastic, non-slip stool.  Keep the floor dry. Clean up any water that spills on the floor as soon as it happens.  Remove soap buildup in the tub or shower  regularly.  Attach bath mats securely with double-sided non-slip rug tape.  Do not have throw rugs and other things on the floor that can make you trip. What can I do in the bedroom?  Use night lights.  Make sure that you have a light by your bed that is easy to reach.  Do not use any sheets or blankets that are too big for your bed. They should not hang down onto the floor.  Have a firm chair that has side arms. You can use this for support while you get dressed.  Do not have throw rugs and other things on the floor that can make you trip. What can I do in the kitchen?  Clean up any spills right away.  Avoid walking on wet floors.  Keep items that you use a lot in easy-to-reach places.  If you need to reach something above you, use a strong step stool that has a grab bar.  Keep electrical cords out of the way.  Do not use floor polish or wax that makes floors slippery. If you must use wax, use non-skid floor wax.  Do not have throw rugs and other things on the floor that can make you trip. What can I do with  my stairs?  Do not leave any items on the stairs.  Make sure that there are handrails on both sides of the stairs and use them. Fix handrails that are broken or loose. Make sure that handrails are as long as the stairways.  Check any carpeting to make sure that it is firmly attached to the stairs. Fix any carpet that is loose or worn.  Avoid having throw rugs at the top or bottom of the stairs. If you do have throw rugs, attach them to the floor with carpet tape.  Make sure that you have a light switch at the top of the stairs and the bottom of the stairs. If you do not have them, ask someone to add them for you. What else can I do to help prevent falls?  Wear shoes that:  Do not have high heels.  Have rubber bottoms.  Are comfortable and fit you well.  Are closed at the toe. Do not wear sandals.  If you use a stepladder:  Make sure that it is fully  opened. Do not climb a closed stepladder.  Make sure that both sides of the stepladder are locked into place.  Ask someone to hold it for you, if possible.  Clearly mark and make sure that you can see:  Any grab bars or handrails.  First and last steps.  Where the edge of each step is.  Use tools that help you move around (mobility aids) if they are needed. These include:  Canes.  Walkers.  Scooters.  Crutches.  Turn on the lights when you go into a dark area. Replace any light bulbs as soon as they burn out.  Set up your furniture so you have a clear path. Avoid moving your furniture around.  If any of your floors are uneven, fix them.  If there are any pets around you, be aware of where they are.  Review your medicines with your doctor. Some medicines can make you feel dizzy. This can increase your chance of falling. Ask your doctor what other things that you can do to help prevent falls. This information is not intended to replace advice given to you by your health care provider. Make sure you discuss any questions you have with your health care provider. Document Released: 10/02/2009 Document Revised: 05/13/2016 Document Reviewed: 01/10/2015 Elsevier Interactive Patient Education  2017 [employer].

## 2020-10-16 NOTE — Progress Notes (Signed)
Subjective:   Tanner Griffin is a 79 y.o. male who presents for Medicare Annual/Subsequent preventive examination.  I connected with Tanner Griffin today by telephone and verified that I am speaking with the correct person using two identifiers. Location patient: home Location provider: work Persons participating in the virtual visit: patient, [name].    I discussed the limitations, risks, security and privacy concerns of performing an evaluation and management service by telephone and the availability of in person appointments. I also discussed with the patient that there may be a patient responsible charge related to this service. The patient expressed understanding and verbally consented to this telephonic visit.    Interactive audio and video telecommunications were attempted between this provider and patient, however failed, due to patient having technical difficulties OR patient did not have access to video capability.  We continued and completed visit with audio only.  Some vital signs may be absent or patient reported.   Time Spent with patient on telephone encounter: 25 minutes  Review of Systems     Cardiac Risk Factors include: advanced age (>73men, >37 women);diabetes mellitus;hypertension;dyslipidemia;male gender     Objective:    Today's Vitals   10/16/20 1546  Weight: 169 lb (76.7 kg)  Height: 5\' 8"  (1.727 m)   Body mass index is 25.7 kg/m.  Advanced Directives 10/16/2020 05/14/2020 07/05/2019 06/15/2019 04/11/2017 12/21/2016 10/29/2014  Does Patient Have a Medical Advance Directive? No No No No No No No  Would patient like information on creating a medical advance directive? Yes (MAU/Ambulatory/Procedural Areas - Information given) No - Patient declined No - Patient declined No - Patient declined - - No - patient declined information  Pre-existing out of facility DNR order (yellow form or pink MOST form) - - - - - - -    Current Medications (verified) Outpatient Encounter  Medications as of 10/16/2020  Medication Sig  . amLODipine (NORVASC) 10 MG tablet Take 1 tablet (10 mg total) by mouth daily.  [name] atorvastatin (LIPITOR) 40 MG tablet Take 1 tablet (40 mg total) by mouth daily.  . meloxicam (MOBIC) 7.5 MG tablet Take 1 tablet (7.5 mg total) by mouth daily.  . tamsulosin (FLOMAX) 0.4 MG CAPS capsule Take 1 capsule (0.4 mg total) by mouth daily after supper.  [name] alendronate (FOSAMAX) 70 MG tablet Take 1 tablet (70 mg total) by mouth every 7 (seven) days. Take with a full glass of water on an empty stomach. (Patient not taking: Reported on 10/16/2020)   No facility-administered encounter medications on file as of 10/16/2020.    Allergies (verified) Telmisartan   History: Past Medical History:  Diagnosis Date  . Arthritis   . Arthrofibrosis of total knee arthroplasty, right 03/26/2014  . Constipation    takes Miralax daily as needed  . Headache    occasional  . HYPERLIPIDEMIA    takes Lipitor daily  . HYPERTENSION    takes Amlodipine and Labetalol daily  . Joint pain   . Joint swelling   . Nocturia   . Osteoarthritis of left knee 03/26/2014  . Pneumonia    hx of-as a child  . Prostate cancer ([name])   . Urinary frequency    takes Flomax daily  . Wears eyeglasses   . Wears partial dentures    top    Past Surgical History:  Procedure Laterality Date  . COLONOSCOPY    . HERNIA REPAIR     inguinal 04/14/17  . INGUINAL HERNIA REPAIR Right 04/14/2017   Procedure: RIGHT  INGUINAL HERNIA REPAIR WITH MESH;  Surgeon: Armandina Gemma, MD;  Location: [facility];  Service: General;  Laterality: Right;  . INSERTION OF MESH Right 04/14/2017   Procedure: INSERTION OF MESH;  Surgeon: Armandina Gemma, MD;  Location: [facility];  Service: General;  Laterality: Right;  . KNEE CLOSED REDUCTION Right 03/26/2014   Procedure: CLOSED MANIPULATION KNEE;  Surgeon: [name], MD;  Location: [facility];  Service: Orthopedics;  Laterality: Right;  . RADIOACTIVE SEED IMPLANT N/A 06/15/2019    Procedure: RADIOACTIVE SEED IMPLANT/BRACHYTHERAPY IMPLANT;  Surgeon: [name], MD;  [facility];  Service: Urology;  Laterality: N/A;  . SPACE OAR INSTILLATION N/A 06/15/2019   Procedure: SPACE OAR INSTILLATION;  Surgeon: [name], MD;  Location: [facility];  Service: Urology;  Laterality: N/A;  . TOTAL KNEE ARTHROPLASTY  2011   right, Dr. [name] per pt  . TOTAL KNEE ARTHROPLASTY Left 03/26/2014   DR LANDAU  . TOTAL KNEE ARTHROPLASTY Left 03/26/2014   Procedure: TOTAL KNEE ARTHROPLASTY;  Surgeon: [name], MD;  Location: [facility];  Service: Orthopedics;  Laterality: Left;   Family History  Problem Relation Age of Onset  . Brain cancer Father   . Renal Disease Brother        ESRD, unknown cause  . Stomach cancer Neg Hx   . Esophageal cancer Neg Hx   . Rectal cancer Neg Hx   . Breast cancer Neg Hx   . Colon cancer Neg Hx   . Pancreatic cancer Neg Hx    Social History   Socioeconomic History  . Marital status: Married    Spouse name: Mary  . Number of children: 5  . Years of education: 9th  . Highest education level: Not on file  Occupational History  . Occupation: retired    Comment: Architect  Tobacco Use  . Smoking status: Never Smoker  . Smokeless tobacco: Never Used  Vaping Use  . Vaping Use: Never used  Substance and Sexual Activity  . Alcohol use: No    Alcohol/week: 0.0 standard drinks  . Drug use: No  . Sexual activity: Not Currently  Other Topics Concern  . Not on file  Social History Narrative   Married, together 81 years in 2016. 1 son, 2 daughters. Can't count # grandkids at least 10, 11. 1 great-granddaughter.   [place] in [place] [address]   Grandson and daughter live with them.   Retired from Architect work.   Hobbies: time with grand kids-play basketball.   Social Determinants of Health   Financial Resource Strain: Low Risk   . Difficulty of Paying Living Expenses: Not hard at  all  Food Insecurity: No Food Insecurity  . Worried About Charity fundraiser in the Last Year: Never true  . Ran Out of Food in the Last Year: Never true  Transportation Needs: No Transportation Needs  . Lack of Transportation (Medical): No  . Lack of Transportation (Non-Medical): No  Physical Activity: Inactive  . Days of Exercise per Week: 0 days  . Minutes of Exercise per Session: 0 min  Stress: No Stress Concern Present  . Feeling of Stress : Not at all  Social Connections: Moderately Integrated  . Frequency of Communication with Friends and Family: More than three times a week  . Frequency of Social Gatherings with Friends and Family: Once a week  . Attends Religious Services: 1 to 4 times per year  . Active Member of Clubs  or Organizations: No  . Attends Archivist Meetings: Never  . Marital Status: Married    Tobacco Counseling Counseling given: Not Answered   Clinical Intake:  Pre-visit preparation completed: Yes  Pain : 0-10 Pain Type: Acute pain Pain Location: Leg Pain Orientation: Right Pain Onset: In the past 7 days     Nutritional Status: BMI 25 -29 Overweight Nutritional Risks: None Diabetes: Yes CBG done?: No Did pt. bring in CBG monitor from home?: No (phone visit)  How often do you need to have someone help you when you read instructions, pamphlets, or other written materials from your doctor or pharmacy?: 1 - Never What is the last grade level you completed in school?: 10th grade  Diabetes:  Is the patient diabetic?  Yes  If diabetic, was a CBG obtained today?  No  Did the patient bring in their glucometer from home?  No phone visit How often do you monitor your CBG's? never.   Financial Strains and Diabetes Management:  Are you having any financial strains with the device, your supplies or your medication? No .  Does the patient want to be seen by Chronic Care Management for management of their diabetes?  No  Would the patient  like to be referred to a Nutritionist or for Diabetic Management?  No   Diabetic Exams:  Diabetic Eye Exam: Completed 2020-per patient. Patient does not know the name of the eye doctor.   Diabetic Foot Exam:  Pt has been advised about the importance in completing this exam. To be completed by PCP.     Interpreter Needed?: No  Information entered by :: Caroleen Hamman LPN   Activities of Daily Living In your present state of health, do you have any difficulty performing the following activities: 10/16/2020  Hearing? N  Vision? N  Difficulty concentrating or making decisions? N  Walking or climbing stairs? N  Dressing or bathing? N  Doing errands, shopping? N  Preparing Food and eating ? N  Using the Toilet? N  In the past six months, have you accidently leaked urine? Y  Do you have problems with loss of bowel control? N  Managing your Medications? N  Managing your Finances? N  Housekeeping or managing your Housekeeping? N  Some recent data might be hidden    Patient Care Team: Libby Maw, MD as PCP - General (Family Medicine) Cira Rue, RN Nurse Navigator as Registered Nurse ([place]) Germaine Pomfret, [facility] as Pharmacist (Pharmacist)  Indicate any recent Medical Services you may have received from other than [facility] providers in the past year (date may be approximate).     Assessment:   This is a routine wellness examination for Gwyndolyn Saxon.  Hearing/Vision screen  Hearing Screening   125Hz  250Hz  500Hz  1000Hz  2000Hz  3000Hz  4000Hz  6000Hz  8000Hz   Right ear:           Left ear:           Comments: Ears feel stopped up  Vision Screening Comments: Wears glasses Last eye exam-2020  Dietary issues and exercise activities discussed: Current Exercise Habits: Home exercise routine, Type of exercise: strength training/weights, Time (Minutes): 20, Frequency (Times/Week): 7, Weekly Exercise (Minutes/Week): 140, Intensity: Mild, Exercise limited by: Other - see  comments (impaired balance)  Goals    . Chronic Care Management     CARE PLAN ENTRY  Current Barriers:  . Chronic Disease Management support, education, and care coordination needs related to Hypertension and Hyperlipidemia   Hypertension .  Pharmacist Clinical Goal(s): o Over the next 30 days, patient will work with PharmD and providers to maintain BP goal <140/90 . Current regimen:  o Amlodipine 10 mg  . Patient self care activities - Over the next 30 days, patient will: o Check blood pressure 2-3 times weekly, document, and provide at future appointments o Ensure daily salt intake < 2300 mg/day  Hyperlipidemia . Pharmacist Clinical Goal(s): o Over the next 90 days, patient will work with PharmD and providers to maintain LDL goal < 100 . Current regimen:  o Atorvastatin 40 mg   Medication management . Pharmacist Clinical Goal(s): o Over the next 90 days, patient will work with PharmD and providers to achieve optimal medication adherence . Current pharmacy: [facility] . Interventions o Comprehensive medication review performed. o Continue current medication management strategy . Patient self care activities - Over the next 90 days, patient will: o Take medications as prescribed o Report any questions or concerns to PharmD and/or provider(s)     . Patient Stated     Maintain or improve current health       Depression Screen PHQ 2/9 Scores 10/16/2020 12/21/2016 11/09/2016 06/02/2015 01/23/2014  PHQ - 2 Score 0 0 0 0 0    Fall Risk Fall Risk  10/16/2020 03/31/2020 11/03/2018 12/21/2016 11/26/2016  Falls in the past year? 1 1 1  Yes Yes  Comment - - Emmi Telephone Survey: data to providers prior to load - Emmi Telephone Survey: data to providers prior to load  Number falls in past yr: 1 1 1 2  or more 2 or more  Comment - - Emmi Telephone Survey Actual Response = 12 - Emmi Telephone Survey Actual Response = 45  Injury with Fall? 0 0 0 - No  Risk Factor Category  - - - - -    Risk for fall due to : Impaired balance/gait;History of fall(s) - - Impaired balance/gait -  Risk for fall due to: Comment - - - secondary to knee pain in right -  Follow up Falls prevention discussed - - Education provided -    Any stairs in or around the home? No  Home free of loose throw rugs in walkways, pet beds, electrical cords, etc? Yes  Adequate lighting in your home to reduce risk of falls? Yes   ASSISTIVE DEVICES UTILIZED TO PREVENT FALLS:  Life alert? No  Use of a cane, walker or w/c? Yes  Grab bars in the bathroom? No  Shower chair or bench in shower? No  Elevated toilet seat or a handicapped toilet? No   TIMED UP AND GO:  Was the test performed? No . Phone visit  Cognitive Function:No cognitive impairment noted MMSE - Mini Mental State Exam 12/21/2016  Not completed: (No Data)        Immunizations Immunization History  Administered Date(s) Administered  . Fluad Quad(high Dose 65+) 09/27/2019, 10/14/2020  . Influenza Split 09/13/2011, 10/02/2012  . Influenza Whole 09/26/2008, 10/03/2009  . Influenza, High Dose Seasonal PF 09/25/2014, 10/21/2016, 10/19/2017, 10/24/2018  . Influenza,inj,Quad PF,6+ Mos 09/04/2013, 10/17/2015  . Pneumococcal Conjugate-13 10/17/2015  . Pneumococcal Polysaccharide-23 08/18/2010  . Td 12/21/1995, 07/03/2009    TDAP status: Due, Education has been provided regarding the importance of this vaccine. Advised may receive this vaccine at local pharmacy or Health Dept. Aware to provide a copy of the vaccination record if obtained from local pharmacy or Health Dept. Verbalized acceptance and understanding.   Flu Vaccine status: Up to date   Pneumococcal  vaccine status: Up to date   Covid-19 vaccine status: Completed vaccines Per patient. Patient to bring his card with dates to next visit.  Qualifies for Shingles Vaccine? Yes   Zostavax completed No   Shingrix Completed?: No.    Education has been provided regarding the importance of  this vaccine. Patient has been advised to call insurance company to determine out of pocket expense if they have not yet received this vaccine. Advised may also receive vaccine at local pharmacy or Health Dept. Verbalized acceptance and understanding.  Screening Tests Health Maintenance  Topic Date Due  . Hepatitis C Screening  Never done  . COVID-19 Vaccine (1) Never done  . FOOT EXAM  10/16/2016  . OPHTHALMOLOGY EXAM  11/18/2016  . HEMOGLOBIN A1C  09/15/2018  . URINE MICROALBUMIN  03/16/2019  . TETANUS/TDAP  07/04/2019  . INFLUENZA VACCINE  Completed  . PNA vac Low Risk Adult  Completed    Health Maintenance  Health Maintenance Due  Topic Date Due  . Hepatitis C Screening  Never done  . COVID-19 Vaccine (1) Never done  . FOOT EXAM  10/16/2016  . OPHTHALMOLOGY EXAM  11/18/2016  . HEMOGLOBIN A1C  09/15/2018  . URINE MICROALBUMIN  03/16/2019  . TETANUS/TDAP  07/04/2019    Colorectal cancer screening: No longer required.   Lung Cancer Screening: (Low Dose CT Chest recommended if Age 5-80 years, 30 pack-year currently smoking OR have quit w/in 15years.) does not qualify.    Additional Screening:  Hepatitis C Screening: does qualify; phone visit  Vision Screening: Recommended annual ophthalmology exams for early detection of glaucoma and other disorders of the eye. Is the patient up to date with their annual eye exam?  Yes  Who is the provider or what is the name of the office in which the patient attends annual eye exams? Unsure of name  Dental Screening: Recommended annual dental exams for proper oral hygiene  Community Resource Referral / Chronic Care Management: CRR required this visit?  No   CCM required this visit?  No      Plan:     I have personally reviewed and noted the following in the patient's chart:   . Medical and social history . Use of alcohol, tobacco or illicit drugs  . Current medications and supplements . Functional ability and  status . Nutritional status . Physical activity . Advanced directives . List of other physicians . Hospitalizations, surgeries, and ER visits in previous 12 months . Vitals . Screenings to include cognitive, depression, and falls . Referrals and appointments  In addition, I have reviewed and discussed with patient certain preventive protocols, quality metrics, and best practice recommendations. A written personalized care plan for preventive services as well as general preventive health recommendations were provided to patient.    Due to this being a telephonic visit, the after visit summary with patients personalized plan was offered to patient via mail or my-chart. Per request, patient was mailed a copy of [place], LPN   [id number]  Nurse Health Advisor  Nurse Notes: Patient states he has been having loose stools for the past couple of weeks. He has an appt to see Dr. Ethelene Hal on 10/20/20. Patient advised to go to urgent care or the ER if symptoms worsen before his appt. Patient voiced understanding.

## 2020-10-20 ENCOUNTER — Encounter: Payer: Self-pay | Admitting: Family Medicine

## 2020-10-20 ENCOUNTER — Telehealth: Payer: Self-pay | Admitting: Family Medicine

## 2020-10-20 ENCOUNTER — Telehealth (INDEPENDENT_AMBULATORY_CARE_PROVIDER_SITE_OTHER): Payer: Medicare Other | Admitting: Family Medicine

## 2020-10-20 VITALS — Ht 68.0 in | Wt 169.0 lb

## 2020-10-20 DIAGNOSIS — G8929 Other chronic pain: Secondary | ICD-10-CM

## 2020-10-20 DIAGNOSIS — M25561 Pain in right knee: Secondary | ICD-10-CM | POA: Diagnosis not present

## 2020-10-20 DIAGNOSIS — M889 Osteitis deformans of unspecified bone: Secondary | ICD-10-CM

## 2020-10-20 MED ORDER — ALENDRONATE SODIUM 70 MG PO TABS: 70.0000 mg | ORAL_TABLET | ORAL | 4 refills | Status: DC

## 2020-10-20 MED ORDER — MELOXICAM 7.5 MG PO TABS: 7.5000 mg | ORAL_TABLET | Freq: Every day | ORAL | 0 refills | Status: DC

## 2020-10-20 NOTE — Progress Notes (Signed)
Established Patient Office Visit  Subjective:  Patient ID: Tanner Griffin, male    DOB: 03/07/1941  Age: 79 y.o. MRN: [id number]  CC:  Chief Complaint  Patient presents with  . Follow-up    follow up on leg pain. Patient states that he no longer has diarrhea but he would like a refill on Meloxicam    HPI Tanner Griffin presents for follow up of chronic right knee pain. Meloxicam helps. Not receiving refills of fosamax for Pagets. No problems taking either.   Past Medical History:  Diagnosis Date  . Arthritis   . Arthrofibrosis of total knee arthroplasty, right 03/26/2014  . Constipation    takes Miralax daily as needed  . Headache    occasional  . HYPERLIPIDEMIA    takes Lipitor daily  . HYPERTENSION    takes Amlodipine and Labetalol daily  . Joint pain   . Joint swelling   . Nocturia   . Osteoarthritis of left knee 03/26/2014  . Pneumonia    hx of-as a child  . Prostate cancer ([place])   . Urinary frequency    takes Flomax daily  . Wears eyeglasses   . Wears partial dentures    top     Past Surgical History:  Procedure Laterality Date  . COLONOSCOPY    . HERNIA REPAIR     inguinal 04/14/17  . INGUINAL HERNIA REPAIR Right 04/14/2017   Procedure: RIGHT INGUINAL HERNIA REPAIR WITH MESH;  Surgeon: Armandina Gemma, MD;  Location: [facility];  Service: General;  Laterality: Right;  . INSERTION OF MESH Right 04/14/2017   Procedure: INSERTION OF MESH;  Surgeon: Armandina Gemma, MD;  Location: [facility];  Service: General;  Laterality: Right;  . KNEE CLOSED REDUCTION Right 03/26/2014   Procedure: CLOSED MANIPULATION KNEE;  Surgeon: [name], MD;  Location: [facility];  Service: Orthopedics;  Laterality: Right;  . RADIOACTIVE SEED IMPLANT N/A 06/15/2019   Procedure: RADIOACTIVE SEED IMPLANT/BRACHYTHERAPY IMPLANT;  Surgeon: [name], MD;  Location: [facility];  Service: Urology;  Laterality: N/A;  . SPACE OAR INSTILLATION N/A 06/15/2019   Procedure: SPACE OAR INSTILLATION;   Surgeon: [name], MD;  Location: [facility];  Service: Urology;  Laterality: N/A;  . TOTAL KNEE ARTHROPLASTY  2011   right, Dr. [name] per pt  . TOTAL KNEE ARTHROPLASTY Left 03/26/2014   DR LANDAU  . TOTAL KNEE ARTHROPLASTY Left 03/26/2014   Procedure: TOTAL KNEE ARTHROPLASTY;  Surgeon: [name], MD;  Location: [facility];  Service: Orthopedics;  Laterality: Left;    Family History  Problem Relation Age of Onset  . Brain cancer Father   . Renal Disease Brother        ESRD, unknown cause  . Stomach cancer Neg Hx   . Esophageal cancer Neg Hx   . Rectal cancer Neg Hx   . Breast cancer Neg Hx   . Colon cancer Neg Hx   . Pancreatic cancer Neg Hx     Social History   Socioeconomic History  . Marital status: Married    Spouse name: Mary  . Number of children: 5  . Years of education: 9th  . Highest education level: Not on file  Occupational History  . Occupation: retired    Comment: Architect  Tobacco Use  . Smoking status: Never Smoker  . Smokeless tobacco: Never Used  Vaping Use  . Vaping Use: Never used  Substance and Sexual Activity  . Alcohol use: No  Alcohol/week: 0.0 standard drinks  . Drug use: No  . Sexual activity: Not Currently  Other Topics Concern  . Not on file  Social History Narrative   Married, together 34 years in 2016. 1 son, 2 daughters. Can't count # grandkids at least 10, 11. 1 great-granddaughter.   Resides in [place], on [address] [place].   Grandson and daughter live with them.   Retired from Architect work.   Hobbies: time with grand kids-play basketball.   Social Determinants of Health   Financial Resource Strain: Low Risk   . Difficulty of Paying Living Expenses: Not hard at all  Food Insecurity: No Food Insecurity  . Worried About Charity fundraiser in the Last Year: Never true  . Ran Out of Food in the Last Year: Never true  Transportation Needs: No Transportation Needs  . Lack of Transportation  (Medical): No  . Lack of Transportation (Non-Medical): No  Physical Activity: Inactive  . Days of Exercise per Week: 0 days  . Minutes of Exercise per Session: 0 min  Stress: No Stress Concern Present  . Feeling of Stress : Not at all  Social Connections: Moderately Integrated  . Frequency of Communication with Friends and Family: More than three times a week  . Frequency of Social Gatherings with Friends and Family: Once a week  . Attends Religious Services: 1 to 4 times per year  . Active Member of Clubs or Organizations: No  . Attends Archivist Meetings: Never  . Marital Status: Married  Human resources officer Violence: Not At Risk  . Fear of Current or Ex-Partner: No  . Emotionally Abused: No  . Physically Abused: No  . Sexually Abused: No    Outpatient Medications Prior to Visit  Medication Sig Dispense Refill  . amLODipine (NORVASC) 10 MG tablet Take 1 tablet (10 mg total) by mouth daily. 90 tablet 0  . atorvastatin (LIPITOR) 40 MG tablet Take 1 tablet (40 mg total) by mouth daily. 90 tablet 0  . tamsulosin (FLOMAX) 0.4 MG CAPS capsule Take 1 capsule (0.4 mg total) by mouth daily after supper. 90 capsule 0  . alendronate (FOSAMAX) 70 MG tablet Take 1 tablet (70 mg total) by mouth every 7 (seven) days. Take with a full glass of water on an empty stomach. 12 tablet 4  . meloxicam (MOBIC) 7.5 MG tablet Take 1 tablet (7.5 mg total) by mouth daily. 30 tablet 0   No facility-administered medications prior to visit.    Allergies  Allergen Reactions  . Telmisartan Other (See Comments)    Reaction:  Headache     ROS Review of Systems  Constitutional: Negative.   Respiratory: Negative.   Gastrointestinal: Negative for abdominal pain, nausea and vomiting.  Genitourinary: Negative.   Musculoskeletal: Positive for arthralgias and gait problem.  Psychiatric/Behavioral: Negative.       Objective:    Physical Exam Vitals and nursing note reviewed.  Constitutional:       General: He is not in acute distress. Pulmonary:     Effort: Pulmonary effort is normal.  Neurological:     Mental Status: He is oriented to person, place, and time.  Psychiatric:        Mood and Affect: Mood normal.        Behavior: Behavior normal.     Ht 5\' 8"  (1.727 m)   Wt 169 lb (76.7 kg)   BMI 25.70 kg/m  Wt Readings from Last 3 Encounters:  10/20/20 169 lb (76.7 kg)  10/16/20 169 lb (76.7 kg)  09/23/20 169 lb (76.7 kg)     Health Maintenance Due  Topic Date Due  . Hepatitis C Screening  Never done  . COVID-19 Vaccine (1) Never done  . FOOT EXAM  10/16/2016  . OPHTHALMOLOGY EXAM  11/18/2016  . HEMOGLOBIN A1C  09/15/2018  . URINE MICROALBUMIN  03/16/2019  . TETANUS/TDAP  07/04/2019    There are no preventive care reminders to display for this patient.  Lab Results  Component Value Date   TSH 1.03 03/15/2018   Lab Results  Component Value Date   WBC 4.5 04/23/2020   HGB 11.9 (L) 04/23/2020   HCT 36.3 (L) 04/23/2020   MCV 100.0 04/23/2020   PLT 253 04/23/2020   Lab Results  Component Value Date   NA 142 04/23/2020   K 4.1 04/23/2020   CO2 24 04/23/2020   GLUCOSE 103 (H) 04/23/2020   BUN 10 04/23/2020   CREATININE 0.94 04/23/2020   BILITOT 1.0 03/31/2020   ALKPHOS 149 (H) 03/31/2020   AST 25 03/31/2020   ALT 21 03/31/2020   PROT 6.8 03/31/2020   ALBUMIN 4.2 03/31/2020   CALCIUM 9.4 04/23/2020   ANIONGAP 9 04/23/2020   GFR 113.42 03/31/2020   Lab Results  Component Value Date   CHOL 139 03/15/2018   Lab Results  Component Value Date   HDL 46.30 03/15/2018   Lab Results  Component Value Date   LDLCALC 77 03/15/2018   Lab Results  Component Value Date   TRIG 77.0 03/15/2018   Lab Results  Component Value Date   CHOLHDL 3 03/15/2018   Lab Results  Component Value Date   HGBA1C 5.7 03/15/2018      Assessment & Plan:   Problem List Items Addressed This Visit      Musculoskeletal and Integument   Paget's disease of the bone  - Primary   Relevant Medications   alendronate (FOSAMAX) 70 MG tablet     Other   Right knee pain   Relevant Medications   meloxicam (MOBIC) 7.5 MG tablet      Meds ordered this encounter  Medications  . alendronate (FOSAMAX) 70 MG tablet    Sig: Take 1 tablet (70 mg total) by mouth every 7 (seven) days. Take with a full glass of water on an empty stomach.    Dispense:  12 tablet    Refill:  4  . meloxicam (MOBIC) 7.5 MG tablet    Sig: Take 1 tablet (7.5 mg total) by mouth daily. AS NEEDED WITH FOOD.    Dispense:  90 tablet    Refill:  0    Follow-up: Return in about 3 months (around 01/20/2021).    Libby Maw, MD   Virtual Visit via Video Note  I connected with Tanner Griffin on 10/20/20 at  3:00 PM EDT by a video enabled telemedicine application and verified that I am speaking with the correct person using two identifiers.  Location: Patient: HOME Provider: HOME   I discussed the limitations of evaluation and management by telemedicine and the availability of in person appointments. The patient expressed understanding and agreed to proceed.  History of Present Illness:    Observations/Objective:   Assessment and Plan:   Follow Up Instructions:    I discussed the assessment and treatment plan with the patient. The patient was provided an opportunity to ask questions and all were answered. The patient agreed with the plan and demonstrated an understanding of the instructions.  The patient was advised to call back or seek an in-person evaluation if the symptoms worsen or if the condition fails to improve as anticipated.  I provided 20 minutes of non-face-to-face time during this encounter.   Libby Maw, MD

## 2020-10-20 NOTE — Telephone Encounter (Signed)
Pt was no show for appt 10/02/2020. Pt is rescheduled for video visit 10/20/2020. First occurrence. Fee waived. Letter mailed.

## 2020-10-27 ENCOUNTER — Telehealth: Payer: Self-pay

## 2020-10-27 NOTE — Progress Notes (Signed)
  I have attempted without success to contact this patient by phone three times to do his hypertension Disease State call. I left a Voice message for patient to return my call.  [place] Pharmacist Assistant [phone]

## 2020-11-12 ENCOUNTER — Encounter: Payer: Self-pay | Admitting: Family Medicine

## 2020-11-18 ENCOUNTER — Telehealth: Payer: Self-pay

## 2020-11-18 NOTE — Progress Notes (Signed)
    Chronic Care Management Pharmacy Assistant   Name: Tanner Griffin  MRN: [id number] DOB: 10/15/1941  Reason for Encounter: Medication Review   PCP : Libby Maw, MD  Allergies:   Allergies  Allergen Reactions  . Telmisartan Other (See Comments)    Reaction:  Headache     Medications: Outpatient Encounter Medications as of 11/18/2020  Medication Sig  . alendronate (FOSAMAX) 70 MG tablet Take 1 tablet (70 mg total) by mouth every 7 (seven) days. Take with a full glass of water on an empty stomach.  [name] amLODipine (NORVASC) 10 MG tablet Take 1 tablet (10 mg total) by mouth daily.  [name] atorvastatin (LIPITOR) 40 MG tablet Take 1 tablet (40 mg total) by mouth daily.  . meloxicam (MOBIC) 7.5 MG tablet Take 1 tablet (7.5 mg total) by mouth daily. AS NEEDED WITH FOOD.  [name] tamsulosin (FLOMAX) 0.4 MG CAPS capsule Take 1 capsule (0.4 mg total) by mouth daily after supper.   No facility-administered encounter medications on file as of 11/18/2020.    Current Diagnosis: Patient Active Problem List   Diagnosis Date Noted  . Distal radius fracture, right 05/30/2020  . Macrocytic anemia 03/31/2020  . Paget's disease of the bone 12/10/2019  . Acute left-sided low back pain without sciatica 11/29/2019  . Malignant neoplasm of prostate ([place]) 02/12/2019  . Lumbar spinal stenosis 09/25/2018  . Right lumbar radiculopathy 09/05/2018  . Polyneuropathy 09/05/2018  . Right leg weakness 07/26/2018  . Pain in both hands 03/29/2018  . Elevated PSA 03/16/2018  . Right inguinal hernia 03/14/2017  . Degenerative disc disease, lumbar 01/11/2017  . Polyarthralgia 04/26/2016  . Pain due to total knee replacement ([place]) 04/26/2016  . Constipation 01/20/2015  . Focal myoclonus 06/05/2014  . Osteoarthritis of knee s/p bilateral knee replacements 03/26/2014  . Spongiotic dermatitis 03/19/2013  . Personal history of colonic adenoma 10/30/2012  . BPH with obstruction/lower urinary tract symptoms  06/05/2012  . Right knee pain 04/17/2011  . Type II diabetes mellitus, well controlled ([place]) 02/12/2008  . Hyperlipidemia 06/19/2007  . Essential hypertension 06/19/2007     Follow-Up:  Medication Cost Review and Pharmacist Review   Performed cost analysis for patient, estimated yearly medication cost of $0.00 with [facility] or $31.24 with [facility] depending on which pharmacy the patient is using.  [place] Pharmacist Assistant [phone]

## 2020-11-26 ENCOUNTER — Other Ambulatory Visit: Payer: Self-pay | Admitting: Family Medicine

## 2020-11-26 DIAGNOSIS — G8929 Other chronic pain: Secondary | ICD-10-CM

## 2020-11-26 MED ORDER — MELOXICAM 7.5 MG PO TABS: 7.5000 mg | ORAL_TABLET | Freq: Every day | ORAL | 0 refills | Status: DC

## 2020-11-26 NOTE — Telephone Encounter (Signed)
Refill request for pending medication, last OV for knee pain 09/23/20 with Baldo Ash, last refill from Dr. Ethelene Hal 10/20/20 with 90 day supply used PRN no refills. Please advise.

## 2020-11-26 NOTE — Telephone Encounter (Signed)
Patients wife is calling to get a refill on patients Meloxicam. If approved, please send to [facility] on [address] and call her at [phone] to let her know that it has been sent in. Patient is completely out of his medication.

## 2020-11-28 ENCOUNTER — Telehealth: Payer: Self-pay

## 2020-11-28 NOTE — Progress Notes (Signed)
Chronic Care Management Pharmacy Assistant   Name: Tanner Griffin  MRN: [id number] DOB: 07-Jan-1941  Reason for [place] Call.    PCP : Libby Maw, MD  Allergies:   Allergies  Allergen Reactions  . Telmisartan Other (See Comments)    Reaction:  Headache     Medications: Outpatient Encounter Medications as of 11/28/2020  Medication Sig  . alendronate (FOSAMAX) 70 MG tablet Take 1 tablet (70 mg total) by mouth every 7 (seven) days. Take with a full glass of water on an empty stomach.  [name] amLODipine (NORVASC) 10 MG tablet Take 1 tablet (10 mg total) by mouth daily.  [name] atorvastatin (LIPITOR) 40 MG tablet Take 1 tablet (40 mg total) by mouth daily.  . meloxicam (MOBIC) 7.5 MG tablet Take 1 tablet (7.5 mg total) by mouth daily. AS NEEDED WITH FOOD.  [name] tamsulosin (FLOMAX) 0.4 MG CAPS capsule Take 1 capsule (0.4 mg total) by mouth daily after supper.   No facility-administered encounter medications on file as of 11/28/2020.    Current Diagnosis: Patient Active Problem List   Diagnosis Date Noted  . Distal radius fracture, right 05/30/2020  . Macrocytic anemia 03/31/2020  . Paget's disease of the bone 12/10/2019  . Acute left-sided low back pain without sciatica 11/29/2019  . Malignant neoplasm of prostate ([place]) 02/12/2019  . Lumbar spinal stenosis 09/25/2018  . Right lumbar radiculopathy 09/05/2018  . Polyneuropathy 09/05/2018  . Right leg weakness 07/26/2018  . Pain in both hands 03/29/2018  . Elevated PSA 03/16/2018  . Right inguinal hernia 03/14/2017  . Degenerative disc disease, lumbar 01/11/2017  . Polyarthralgia 04/26/2016  . Pain due to total knee replacement ([place]) 04/26/2016  . Constipation 01/20/2015  . Focal myoclonus 06/05/2014  . Osteoarthritis of knee s/p bilateral knee replacements 03/26/2014  . Spongiotic dermatitis 03/19/2013  . Personal history of colonic adenoma 10/30/2012  . BPH with obstruction/lower urinary  tract symptoms 06/05/2012  . Right knee pain 04/17/2011  . Type II diabetes mellitus, well controlled ([place]) 02/12/2008  . Hyperlipidemia 06/19/2007  . Essential hypertension 06/19/2007    Follow-Up:  Pharmacist Review   Reviewed chart prior to disease state call. Spoke with patient regarding BP  Recent Office Vitals: BP Readings from Last 3 Encounters:  09/23/20 120/62  07/02/20 132/70  06/05/20 124/67   Pulse Readings from Last 3 Encounters:  07/02/20 69  06/05/20 69  05/30/20 70    Wt Readings from Last 3 Encounters:  10/20/20 169 lb (76.7 kg)  10/16/20 169 lb (76.7 kg)  09/23/20 169 lb (76.7 kg)     Kidney Function Lab Results  Component Value Date/Time   CREATININE 0.94 04/23/2020 04:19 PM   CREATININE 0.80 03/31/2020 03:16 PM   GFR 113.42 03/31/2020 03:16 PM   GFRNONAA >60 04/23/2020 04:19 PM   GFRAA >60 04/23/2020 04:19 PM    BMP Latest Ref Rng & Units 04/23/2020 03/31/2020 11/29/2019  Glucose 70 - 99 mg/dL 103(H) 89 79  BUN 8 - 23 mg/dL 10 15 20   Creatinine 0.61 - 1.24 mg/dL 0.94 0.80 0.89  Sodium 135 - 145 mmol/L 142 140 141  Potassium 3.5 - 5.1 mmol/L 4.1 4.2 3.9  Chloride 98 - 111 mmol/L 109 107 108  CO2 22 - 32 mmol/L 24 25 26   Calcium 8.9 - 10.3 mg/dL 9.4 9.8 9.8    . Current antihypertensive regimen:  ? Amlodipine 10 mg  . How often are you checking your Blood Pressure? Patient states  he checks his blood pressure at home and it is normal. . Current home BP readings: Patient states he is unsure why im asking him about his blood pressure and he is busy at the moment. . What recent interventions/DTPs have been made by any provider to improve Blood Pressure control since last CPP Visit: None ID . Any recent hospitalizations or ED visits since last visit with CPP? No . What diet changes have been made to improve Blood Pressure Control?  o None ID . What exercise is being done to improve your Blood Pressure Control?  o None ID  Adherence Review: Is the  patient currently on ACE/ARB medication? No Does the patient have >5 day gap between last estimated fill dates? Yes   [place] Pharmacist Assistant [phone]

## 2020-12-18 ENCOUNTER — Telehealth: Payer: Self-pay

## 2020-12-18 NOTE — Progress Notes (Signed)
    Chronic Care Management Pharmacy Assistant   Name: Tanner Griffin  MRN: [id number] DOB: May 15, 1941  Reason for Encounter: Medication Review   PCP : Mliss Sax, MD  Allergies:   Allergies  Allergen Reactions  . Telmisartan Other (See Comments)    Reaction:  Headache     Medications: Outpatient Encounter Medications as of 12/18/2020  Medication Sig  . alendronate (FOSAMAX) 70 MG tablet Take 1 tablet (70 mg total) by mouth every 7 (seven) days. Take with a full glass of water on an empty stomach.  [name] amLODipine (NORVASC) 10 MG tablet Take 1 tablet (10 mg total) by mouth daily.  [name] atorvastatin (LIPITOR) 40 MG tablet Take 1 tablet (40 mg total) by mouth daily.  . meloxicam (MOBIC) 7.5 MG tablet Take 1 tablet (7.5 mg total) by mouth daily. AS NEEDED WITH FOOD.  [name] tamsulosin (FLOMAX) 0.4 MG CAPS capsule Take 1 capsule (0.4 mg total) by mouth daily after supper.   No facility-administered encounter medications on file as of 12/18/2020.    Current Diagnosis: Patient Active Problem List   Diagnosis Date Noted  . Distal radius fracture, right 05/30/2020  . Macrocytic anemia 03/31/2020  . Paget's disease of the bone 12/10/2019  . Acute left-sided low back pain without sciatica 11/29/2019  . Malignant neoplasm of prostate ([facility]) 02/12/2019  . Lumbar spinal stenosis 09/25/2018  . Right lumbar radiculopathy 09/05/2018  . Polyneuropathy 09/05/2018  . Right leg weakness 07/26/2018  . Pain in both hands 03/29/2018  . Elevated PSA 03/16/2018  . Right inguinal hernia 03/14/2017  . Degenerative disc disease, lumbar 01/11/2017  . Polyarthralgia 04/26/2016  . Pain due to total knee replacement ([facility]) 04/26/2016  . Constipation 01/20/2015  . Focal myoclonus 06/05/2014  . Osteoarthritis of knee s/p bilateral knee replacements 03/26/2014  . Spongiotic dermatitis 03/19/2013  . Personal history of colonic adenoma 10/30/2012  . BPH with obstruction/lower urinary tract symptoms  06/05/2012  . Right knee pain 04/17/2011  . Type II diabetes mellitus, well controlled ([facility]) 02/12/2008  . Hyperlipidemia 06/19/2007  . Essential hypertension 06/19/2007    Goals Addressed   None     Reviewed chart and adherence measures. Per insurance data patient is 100 % adherent to Atorvastatin.   Follow-Up:  Pharmacist Review   [place] Clinical Pharmacist Assistant [phone]

## 2020-12-26 ENCOUNTER — Other Ambulatory Visit: Payer: Self-pay | Admitting: Family Medicine

## 2020-12-26 DIAGNOSIS — E785 Hyperlipidemia, unspecified: Secondary | ICD-10-CM

## 2020-12-26 DIAGNOSIS — I152 Hypertension secondary to endocrine disorders: Secondary | ICD-10-CM

## 2020-12-26 DIAGNOSIS — E1159 Type 2 diabetes mellitus with other circulatory complications: Secondary | ICD-10-CM

## 2021-01-01 ENCOUNTER — Ambulatory Visit (INDEPENDENT_AMBULATORY_CARE_PROVIDER_SITE_OTHER): Payer: Medicare Other | Admitting: Family Medicine

## 2021-01-01 ENCOUNTER — Encounter: Payer: Self-pay | Admitting: Family Medicine

## 2021-01-01 ENCOUNTER — Other Ambulatory Visit: Payer: Self-pay

## 2021-01-01 DIAGNOSIS — G8929 Other chronic pain: Secondary | ICD-10-CM | POA: Diagnosis not present

## 2021-01-01 DIAGNOSIS — M25561 Pain in right knee: Secondary | ICD-10-CM

## 2021-01-01 MED ORDER — MELOXICAM 15 MG PO TABS: 15.0000 mg | ORAL_TABLET | Freq: Every day | ORAL | 1 refills | Status: DC

## 2021-01-01 MED ORDER — TRAMADOL HCL 50 MG PO TABS: 50.0000 mg | ORAL_TABLET | Freq: Two times a day (BID) | ORAL | 0 refills | Status: AC | PRN

## 2021-01-01 NOTE — Progress Notes (Signed)
Tanner Griffin is a 80 y.o. male  Chief Complaint  Patient presents with  . Acute Visit    C/o having  RT leg pain x 1-2 weeks.      HPI: Tanner Griffin is a 80 y.o. male patient of Dr. Ethelene Hal who complains of Rt knee pain x few months. He states he has had pain in his Rt leg even since he had his Rt TKA. Current pain is worse first thing in the AM and then improves somewhat as day goes on. Also worse if he sits for a long time and then tries to get up. No change from baseline swelling.  He has a long h/o B/L knee issues and has had B/L knee replacements. He states he saw ortho at [facility] about 6 mo ago and was told surgery is not an option.  He just started taking meloxicam 7.5mg  daily qAM about 2 wks ago. He states tylenol arthritis "does nothing".    Past Medical History:  Diagnosis Date  . Arthritis   . Arthrofibrosis of total knee arthroplasty, right 03/26/2014  . Constipation    takes Miralax daily as needed  . Headache    occasional  . HYPERLIPIDEMIA    takes Lipitor daily  . HYPERTENSION    takes Amlodipine and Labetalol daily  . Joint pain   . Joint swelling   . Nocturia   . Osteoarthritis of left knee 03/26/2014  . Pneumonia    hx of-as a child  . Prostate cancer ([place])   . Urinary frequency    takes Flomax daily  . Wears eyeglasses   . Wears partial dentures    top     Past Surgical History:  Procedure Laterality Date  . COLONOSCOPY    . HERNIA REPAIR     inguinal 04/14/17  . INGUINAL HERNIA REPAIR Right 04/14/2017   Procedure: RIGHT INGUINAL HERNIA REPAIR WITH MESH;  Surgeon: Armandina Gemma, MD;  Location: [facility];  Service: General;  Laterality: Right;  . INSERTION OF MESH Right 04/14/2017   Procedure: INSERTION OF MESH;  Surgeon: Armandina Gemma, MD;  Location: [facility];  Service: General;  Laterality: Right;  . KNEE CLOSED REDUCTION Right 03/26/2014   Procedure: CLOSED MANIPULATION KNEE;  Surgeon: [name], MD;  Location: [place];  Service: Orthopedics;   Laterality: Right;  . RADIOACTIVE SEED IMPLANT N/A 06/15/2019   Procedure: RADIOACTIVE SEED IMPLANT/BRACHYTHERAPY IMPLANT;  Surgeon: [name], MD;  Location: [facility];  Service: Urology;  Laterality: N/A;  . SPACE OAR INSTILLATION N/A 06/15/2019   Procedure: SPACE OAR INSTILLATION;  Surgeon: [name], MD;  Location[facility];  Service: Urology;  Laterality: N/A;  . TOTAL KNEE ARTHROPLASTY  2011   right, Dr. [name] per pt  . TOTAL KNEE ARTHROPLASTY Left 03/26/2014   DR LANDAU  . TOTAL KNEE ARTHROPLASTY Left 03/26/2014   Procedure: TOTAL KNEE ARTHROPLASTY;  Surgeon: [name], MD;  Location: [place];  Service: Orthopedics;  Laterality: Left;    Social History   Socioeconomic History  . Marital status: Married    Spouse name: Mary  . Number of children: 5  . Years of education: 9th  . Highest education level: Not on file  Occupational History  . Occupation: retired    Comment: Architect  Tobacco Use  . Smoking status: Never Smoker  . Smokeless tobacco: Never Used  Vaping Use  . Vaping Use: Never used  Substance and Sexual Activity  .  Alcohol use: No    Alcohol/week: 0.0 standard drinks  . Drug use: No  . Sexual activity: Not Currently  Other Topics Concern  . Not on file  Social History Narrative   Married, together 92 years in 2016. 1 son, 2 daughters. Can't count # grandkids at least 10, 11. 1 great-granddaughter.   Resides in [place], [place] [address].   Grandson and daughter live with them.   Retired from Architect work.   Hobbies: time with grand kids-play basketball.   Social Determinants of Health   Financial Resource Strain: Low Risk   . Difficulty of Paying Living Expenses: Not hard at all  Food Insecurity: No Food Insecurity  . Worried About Charity fundraiser in the Last Year: Never true  . Ran Out of Food in the Last Year: Never true  Transportation Needs: No Transportation Needs  . Lack of  Transportation (Medical): No  . Lack of Transportation (Non-Medical): No  Physical Activity: Inactive  . Days of Exercise per Week: 0 days  . Minutes of Exercise per Session: 0 min  Stress: No Stress Concern Present  . Feeling of Stress : Not at all  Social Connections: Moderately Integrated  . Frequency of Communication with Friends and Family: More than three times a week  . Frequency of Social Gatherings with Friends and Family: Once a week  . Attends Religious Services: 1 to 4 times per year  . Active Member of Clubs or Organizations: No  . Attends Archivist Meetings: Never  . Marital Status: Married  Human resources officer Violence: Not At Risk  . Fear of Current or Ex-Partner: No  . Emotionally Abused: No  . Physically Abused: No  . Sexually Abused: No    Family History  Problem Relation Age of Onset  . Brain cancer Father   . Renal Disease Brother        ESRD, unknown cause  . Stomach cancer Neg Hx   . Esophageal cancer Neg Hx   . Rectal cancer Neg Hx   . Breast cancer Neg Hx   . Colon cancer Neg Hx   . Pancreatic cancer Neg Hx      Immunization History  Administered Date(s) Administered  . Fluad Quad(high Dose 65+) 09/27/2019, 10/14/2020  . Influenza Split 09/13/2011, 10/02/2012  . Influenza Whole 09/26/2008, 10/03/2009  . Influenza, High Dose Seasonal PF 09/25/2014, 10/21/2016, 10/19/2017, 10/24/2018  . Influenza,inj,Quad PF,6+ Mos 09/04/2013, 10/17/2015  . Pneumococcal Conjugate-13 10/17/2015  . Pneumococcal Polysaccharide-23 08/18/2010  . Td 12/21/1995, 07/03/2009    Outpatient Encounter Medications as of 01/01/2021  Medication Sig  . alendronate (FOSAMAX) 70 MG tablet Take 1 tablet (70 mg total) by mouth every 7 (seven) days. Take with a full glass of water on an empty stomach.  [name] amLODipine (NORVASC) 10 MG tablet TAKE 1 TABLET BY MOUTH  DAILY  . atorvastatin (LIPITOR) 40 MG tablet TAKE 1 TABLET BY MOUTH  DAILY  . meloxicam (MOBIC) 7.5 MG tablet Take  1 tablet (7.5 mg total) by mouth daily. AS NEEDED WITH FOOD.  [name] tamsulosin (FLOMAX) 0.4 MG CAPS capsule Take 1 capsule (0.4 mg total) by mouth daily after supper.   No facility-administered encounter medications on file as of 01/01/2021.     ROS: Pertinent positives and negatives noted in HPI. Remainder of ROS non-contributory    Allergies  Allergen Reactions  . Telmisartan Other (See Comments)    Reaction:  Headache     BP 132/70   Pulse  70   Temp 98.7 F (37.1 C) (Temporal)   Ht 5\' 8"  (1.727 m)   Wt 160 lb 12.8 oz (72.9 kg)   SpO2 99%   BMI 24.45 kg/m   Physical Exam Constitutional:      General: He is not in acute distress.    Appearance: Normal appearance.  Cardiovascular:     Pulses: Normal pulses.  Musculoskeletal:     Right knee: Swelling, bony tenderness and crepitus present. No erythema or ecchymosis. Decreased range of motion. Tenderness present over the medial joint line and lateral joint line.     Right lower leg: No edema.     Left lower leg: No edema.  Neurological:     Mental Status: He is alert and oriented to person, place, and time.  Psychiatric:        Mood and Affect: Mood normal.        Behavior: Behavior normal.      A/P:  1. Chronic pain of right knee Increase: - meloxicam (MOBIC) 15 MG tablet; Take 1 tablet (15 mg total) by mouth daily.  Dispense: 90 tablet; Refill: 1 Rx: - traMADol (ULTRAM) 50 MG tablet; Take 1 tablet (50 mg total) by mouth every 12 (twelve) hours as needed for up to 5 days.  Dispense: 30 tablet; Refill: 0 - f/u in 3-4 wks if no/minimal improvement with above   This visit occurred during the SARS-CoV-2 public health emergency.  Safety protocols were in place, including screening questions prior to the visit, additional usage of staff PPE, and extensive cleaning of exam room while observing appropriate contact time as indicated for disinfecting solutions.

## 2021-01-29 ENCOUNTER — Telehealth: Payer: Self-pay

## 2021-01-29 NOTE — Progress Notes (Signed)
Chronic Care Management Pharmacy Assistant   Name: Tanner Griffin  MRN: [id number] DOB: 07-03-1941  Reason for [place] Call.  Patient Questions:  1.  Have you seen any other providers since your last visit? Yes, 01/01/2021 PCP Mary Cirigliano  2.  Any changes in your medicines or health? Yes, 01/01/2021 PCP Mary Cirigliano Increase melozicam 15mg  Daily, tramadol 50 mg PRN     PCP : Libby Maw, MD  Allergies:   Allergies  Allergen Reactions  . Telmisartan Other (See Comments)    Reaction:  Headache     Medications: Outpatient Encounter Medications as of 01/29/2021  Medication Sig  . alendronate (FOSAMAX) 70 MG tablet Take 1 tablet (70 mg total) by mouth every 7 (seven) days. Take with a full glass of water on an empty stomach.  [name] amLODipine (NORVASC) 10 MG tablet TAKE 1 TABLET BY MOUTH  DAILY  . atorvastatin (LIPITOR) 40 MG tablet TAKE 1 TABLET BY MOUTH  DAILY  . meloxicam (MOBIC) 15 MG tablet Take 1 tablet (15 mg total) by mouth daily.  . tamsulosin (FLOMAX) 0.4 MG CAPS capsule Take 1 capsule (0.4 mg total) by mouth daily after supper.   No facility-administered encounter medications on file as of 01/29/2021.    Current Diagnosis: Patient Active Problem List   Diagnosis Date Noted  . Distal radius fracture, right 05/30/2020  . Macrocytic anemia 03/31/2020  . Paget's disease of the bone 12/10/2019  . Acute left-sided low back pain without sciatica 11/29/2019  . Malignant neoplasm of prostate ([place]) 02/12/2019  . Lumbar spinal stenosis 09/25/2018  . Right lumbar radiculopathy 09/05/2018  . Polyneuropathy 09/05/2018  . Right leg weakness 07/26/2018  . Pain in both hands 03/29/2018  . Elevated PSA 03/16/2018  . Right inguinal hernia 03/14/2017  . Degenerative disc disease, lumbar 01/11/2017  . Polyarthralgia 04/26/2016  . Pain due to total knee replacement ([place]) 04/26/2016  . Constipation 01/20/2015  . Focal myoclonus  06/05/2014  . Osteoarthritis of knee s/p bilateral knee replacements 03/26/2014  . Spongiotic dermatitis 03/19/2013  . Personal history of colonic adenoma 10/30/2012  . BPH with obstruction/lower urinary tract symptoms 06/05/2012  . Right knee pain 04/17/2011  . Type II diabetes mellitus, well controlled ([place]) 02/12/2008  . Hyperlipidemia 06/19/2007  . Essential hypertension 06/19/2007    Goals Addressed   None    Reviewed chart prior to disease state call. Spoke with patient regarding BP  Recent Office Vitals: BP Readings from Last 3 Encounters:  01/01/21 132/70  09/23/20 120/62  07/02/20 132/70   Pulse Readings from Last 3 Encounters:  01/01/21 70  07/02/20 69  06/05/20 69    Wt Readings from Last 3 Encounters:  01/01/21 160 lb 12.8 oz (72.9 kg)  10/20/20 169 lb (76.7 kg)  10/16/20 169 lb (76.7 kg)     Kidney Function Lab Results  Component Value Date/Time   CREATININE 0.94 04/23/2020 04:19 PM   CREATININE 0.80 03/31/2020 03:16 PM   GFR 113.42 03/31/2020 03:16 PM   GFRNONAA >60 04/23/2020 04:19 PM   GFRAA >60 04/23/2020 04:19 PM    BMP Latest Ref Rng & Units 04/23/2020 03/31/2020 11/29/2019  Glucose 70 - 99 mg/dL 103(H) 89 79  BUN 8 - 23 mg/dL 10 15 20   Creatinine 0.61 - 1.24 mg/dL 0.94 0.80 0.89  Sodium 135 - 145 mmol/L 142 140 141  Potassium 3.5 - 5.1 mmol/L 4.1 4.2 3.9  Chloride 98 - 111 mmol/L 109 107 108  CO2 22 - 32 mmol/L 24 25 26   Calcium 8.9 - 10.3 mg/dL 9.4 9.8 9.8    . Current antihypertensive regimen:  ? Amlodipine 10 mg  . How often are you checking your Blood Pressure? infrequently . Current home BP readings: o Patient states his blood pressure is good, and does not have readings. . What recent interventions/DTPs have been made by any provider to improve Blood Pressure control since last CPP Visit: None ID  . Any recent hospitalizations or ED visits since last visit with CPP? No . What diet changes have been made to improve Blood Pressure  Control?  o Patient reports he has no changes to his diet. . What exercise is being done to improve your Blood Pressure Control?  o Patient states he "walks as much as he can in the day"  Adherence Review: Is the patient currently on ACE/ARB medication? No Does the patient have >5 day gap between last estimated fill dates? Yes   Maryjean Ka  Follow-Up:  Pharmacist Review   Anderson [place] Clinical Pharmacist Assistant [phone] (25 minutes)

## 2021-02-05 ENCOUNTER — Other Ambulatory Visit: Payer: Self-pay

## 2021-02-05 ENCOUNTER — Encounter: Payer: Self-pay | Admitting: Family Medicine

## 2021-02-05 ENCOUNTER — Ambulatory Visit (INDEPENDENT_AMBULATORY_CARE_PROVIDER_SITE_OTHER): Payer: Medicare Other | Admitting: Family Medicine

## 2021-02-05 VITALS — BP 160/82 | HR 83 | Temp 97.6°F | Ht 68.0 in | Wt 159.0 lb

## 2021-02-05 DIAGNOSIS — Z8546 Personal history of malignant neoplasm of prostate: Secondary | ICD-10-CM | POA: Diagnosis not present

## 2021-02-05 DIAGNOSIS — N138 Other obstructive and reflux uropathy: Secondary | ICD-10-CM

## 2021-02-05 DIAGNOSIS — N401 Enlarged prostate with lower urinary tract symptoms: Secondary | ICD-10-CM

## 2021-02-05 DIAGNOSIS — R350 Frequency of micturition: Secondary | ICD-10-CM | POA: Diagnosis not present

## 2021-02-05 MED ORDER — TAMSULOSIN HCL 0.4 MG PO CAPS: 0.4000 mg | ORAL_CAPSULE | Freq: Two times a day (BID) | ORAL | 1 refills | Status: DC

## 2021-02-05 NOTE — Progress Notes (Signed)
Tanner Griffin is a 80 y.o. male  Chief Complaint  Patient presents with  . Acute Visit    C/o having frequent urination x 2 months.  He states last night he had to urinate all night.      HPI: Tanner Griffin is a 80 y.o. male patient of Dr. Ethelene Hal who complains of urinary frequency x 2 mo and "maybe a lot longer". Last night, he states he was up "all night" having to urinate.  No dysuria. No gross hematuria. He endorses urinary urgency which is not new.  No fever, chills, n/v, lower abdominal pain, new/worsening back pain.   Pt has a h/o adenocarcinoma of the prostate (s/p brachytherapy in 01/2019) , BPH, urinary frequency. He is Rx'd flomax but has not been taking it.  He follows with [facility] and was last seen in 09/2020 by Dr. Claudia Desanctis. At [place] with urology in 06/2020 and 07/2019 he complained of nocturia 5-6x/night, urinary urgency, urinary incontinence. He was Rx'd flomax BID and trospium 20mg  daily at 06/2020 OV. Insurance did not cover trospium and he was tried again on myrbetriq (samples given to him at 07/2020 OV). He has urodynamic testing and bladder botox injections. He does not feel this was effective. He states he cannot afford to $45 per visit copay "but just may have to call them to see what they can do"  Past Medical History:  Diagnosis Date  . Arthritis   . Arthrofibrosis of total knee arthroplasty, right 03/26/2014  . Constipation    takes Miralax daily as needed  . Headache    occasional  . HYPERLIPIDEMIA    takes Lipitor daily  . HYPERTENSION    takes Amlodipine and Labetalol daily  . Joint pain   . Joint swelling   . Nocturia   . Osteoarthritis of left knee 03/26/2014  . Pneumonia    hx of-as a child  . Prostate cancer ([place])   . Urinary frequency    takes Flomax daily  . Wears eyeglasses   . Wears partial dentures    top     Past Surgical History:  Procedure Laterality Date  . COLONOSCOPY    . HERNIA REPAIR     inguinal 04/14/17  . INGUINAL HERNIA  REPAIR Right 04/14/2017   Procedure: RIGHT INGUINAL HERNIA REPAIR WITH MESH;  Surgeon: Armandina Gemma, MD;  [facility];  Service: General;  Laterality: Right;  . INSERTION OF MESH Right 04/14/2017   Procedure: INSERTION OF MESH;  Surgeon: Armandina Gemma, MD;  [facility];  Service: General;  Laterality: Right;  . KNEE CLOSED REDUCTION Right 03/26/2014   Procedure: CLOSED MANIPULATION KNEE;  Surgeon: [name], MD;  Location: [place];  Service: Orthopedics;  Laterality: Right;  . RADIOACTIVE SEED IMPLANT N/A 06/15/2019   Procedure: RADIOACTIVE SEED IMPLANT/BRACHYTHERAPY IMPLANT;  Surgeon: [name], MD;  Location[facility];  Service: Urology;  Laterality: N/A;  . SPACE OAR INSTILLATION N/A 06/15/2019   Procedure: SPACE OAR INSTILLATION;  Surgeon: [name], MD;  Location: [facility];  Service: Urology;  Laterality: N/A;  . TOTAL KNEE ARTHROPLASTY  2011   right, Dr. [name] per pt  . TOTAL KNEE ARTHROPLASTY Left 03/26/2014   DR LANDAU  . TOTAL KNEE ARTHROPLASTY Left 03/26/2014   Procedure: TOTAL KNEE ARTHROPLASTY;  Surgeon: [name], MD;  Location: [place];  Service: Orthopedics;  Laterality: Left;    Social History   Socioeconomic History  . Marital status:  Married    Spouse name: Zoua Caporaso  . Number of children: 5  . Years of education: 9th  . Highest education level: Not on file  Occupational History  . Occupation: retired    Comment: Architect  Tobacco Use  . Smoking status: Never Smoker  . Smokeless tobacco: Never Used  Vaping Use  . Vaping Use: Never used  Substance and Sexual Activity  . Alcohol use: No    Alcohol/week: 0.0 standard drinks  . Drug use: No  . Sexual activity: Not Currently  Other Topics Concern  . Not on file  Social History Narrative   Married, together 85 years in 2016. 1 son, 2 daughters. Can't count # grandkids at least 10, 11. 1 great-granddaughter.   Resides in [place], on [address].    Grandson and daughter live with them.   Retired from Architect work.   Hobbies: time with grand kids-play basketball.   Social Determinants of Health   Financial Resource Strain: Low Risk   . Difficulty of Paying Living Expenses: Not hard at all  Food Insecurity: No Food Insecurity  . Worried About Charity fundraiser in the Last Year: Never true  . Ran Out of Food in the Last Year: Never true  Transportation Needs: No Transportation Needs  . Lack of Transportation (Medical): No  . Lack of Transportation (Non-Medical): No  Physical Activity: Inactive  . Days of Exercise per Week: 0 days  . Minutes of Exercise per Session: 0 min  Stress: No Stress Concern Present  . Feeling of Stress : Not at all  Social Connections: Moderately Integrated  . Frequency of Communication with Friends and Family: More than three times a week  . Frequency of Social Gatherings with Friends and Family: Once a week  . Attends Religious Services: 1 to 4 times per year  . Active Member of Clubs or Organizations: No  . Attends Archivist Meetings: Never  . Marital Status: Married  Human resources officer Violence: Not At Risk  . Fear of Current or Ex-Partner: No  . Emotionally Abused: No  . Physically Abused: No  . Sexually Abused: No    Family History  Problem Relation Age of Onset  . Brain cancer Father   . Renal Disease Brother        ESRD, unknown cause  . Stomach cancer Neg Hx   . Esophageal cancer Neg Hx   . Rectal cancer Neg Hx   . Breast cancer Neg Hx   . Colon cancer Neg Hx   . Pancreatic cancer Neg Hx      Immunization History  Administered Date(s) Administered  . Fluad Quad(high Dose 65+) 09/27/2019, 10/14/2020  . Influenza Split 09/13/2011, 10/02/2012  . Influenza Whole 09/26/2008, 10/03/2009  . Influenza, High Dose Seasonal PF 09/25/2014, 10/21/2016, 10/19/2017, 10/24/2018  . Influenza,inj,Quad PF,6+ Mos 09/04/2013, 10/17/2015  . Pneumococcal Conjugate-13 10/17/2015  .  Pneumococcal Polysaccharide-23 08/18/2010  . Td 12/21/1995, 07/03/2009    Outpatient Encounter Medications as of 02/05/2021  Medication Sig  . alendronate (FOSAMAX) 70 MG tablet Take 1 tablet (70 mg total) by mouth every 7 (seven) days. Take with a full glass of water on an empty stomach.  [name] amLODipine (NORVASC) 10 MG tablet TAKE 1 TABLET BY MOUTH  DAILY  . atorvastatin (LIPITOR) 40 MG tablet TAKE 1 TABLET BY MOUTH  DAILY  . meloxicam (MOBIC) 15 MG tablet Take 1 tablet (15 mg total) by mouth daily.  . tamsulosin (FLOMAX) 0.4 MG CAPS capsule  Take 1 capsule (0.4 mg total) by mouth in the morning and at bedtime.  . [DISCONTINUED] tamsulosin (FLOMAX) 0.4 MG CAPS capsule Take 1 capsule (0.4 mg total) by mouth daily after supper. (Patient not taking: Reported on 02/05/2021)   No facility-administered encounter medications on file as of 02/05/2021.     ROS: Pertinent positives and negatives noted in HPI. Remainder of ROS non-contributory   Allergies  Allergen Reactions  . Telmisartan Other (See Comments)    Reaction:  Headache     BP (!) 160/82   Pulse 83   Temp 97.6 F (36.4 C) (Temporal)   Ht 5\' 8"  (1.727 m)   Wt 159 lb (72.1 kg)   SpO2 99%   BMI 24.18 kg/m   Wt Readings from Last 3 Encounters:  02/05/21 159 lb (72.1 kg)  01/01/21 160 lb 12.8 oz (72.9 kg)  10/20/20 169 lb (76.7 kg)   Temp Readings from Last 3 Encounters:  02/05/21 97.6 F (36.4 C) (Temporal)  01/01/21 98.7 F (37.1 C) (Temporal)  07/02/20 97.8 F (36.6 C) (Tympanic)   BP Readings from Last 3 Encounters:  02/05/21 (!) 160/82  01/01/21 132/70  09/23/20 120/62   Pulse Readings from Last 3 Encounters:  02/05/21 83  01/01/21 70  07/02/20 69     Physical Exam Constitutional:      General: He is not in acute distress.    Appearance: Normal appearance. He is not ill-appearing.  Abdominal:     General: Bowel sounds are normal. There is no distension.     Palpations: Abdomen is soft.     Tenderness:  There is no abdominal tenderness. There is no right CVA tenderness or left CVA tenderness.  Musculoskeletal:     Right lower leg: No edema.     Left lower leg: No edema.  Neurological:     Mental Status: He is alert and oriented to person, place, and time.  Psychiatric:        Behavior: Behavior normal.      A/P:  1. Urinary frequency 2. BPH with obstruction/lower urinary tract symptoms 3. Personal history of prostate cancer - follows with [facility] - last OV in 09/2020 - POCT Urinalysis Dipstick - pt was unable to provide a urine sample despite 2 attempts and drinking water here in the office Restart: - tamsulosin (FLOMAX) 0.4 MG CAPS capsule; Take 1 capsule (0.4 mg total) by mouth in the morning and at bedtime.  Dispense: 180 capsule; Refill: 1 - pt was rx'd BID by Dr. Claudia Desanctis in 06/2020 but is not taking - has failed multiple meds for OAB as well as botox injections. Pt needs f/u with urology and advised him to call that office to schedule appt   This visit occurred during the SARS-CoV-2 public health emergency.  Safety protocols were in place, including screening questions prior to the visit, additional usage of staff PPE, and extensive cleaning of exam room while observing appropriate contact time as indicated for disinfecting solutions.

## 2021-02-05 NOTE — Patient Instructions (Signed)
You should be taking flomax (tamsulosin) 0.4mg  1 cap twice per day as prescribed in 06/2020 by Dr. Claudia Desanctis at [facility]

## 2021-02-09 ENCOUNTER — Telehealth: Payer: Self-pay | Admitting: Family Medicine

## 2021-02-09 NOTE — Telephone Encounter (Signed)
Pt wife called and said that husband is still having frequent urination and that the medicine prescribed 02/05/21 by Dr Bryan Lemma doesn't seem to be helping and wanted to know what other options they might have. Callback [phone]  Sending to the Doc of the day

## 2021-02-09 NOTE — Telephone Encounter (Signed)
Spoke with patients wife and patient both verbally understood message below. And agrees to call and have appointment scheduled with urologist.

## 2021-02-17 ENCOUNTER — Other Ambulatory Visit: Payer: Self-pay

## 2021-02-17 ENCOUNTER — Encounter (HOSPITAL_COMMUNITY): Payer: Self-pay | Admitting: Student

## 2021-02-17 ENCOUNTER — Emergency Department (HOSPITAL_COMMUNITY)
Admission: EM | Admit: 2021-02-17 | Discharge: 2021-02-17 | Disposition: A | Discharge status: Home / Self Care | Payer: Medicare Other | Attending: Emergency Medicine | Admitting: Emergency Medicine

## 2021-02-17 ENCOUNTER — Emergency Department (HOSPITAL_COMMUNITY): Payer: Medicare Other

## 2021-02-17 DIAGNOSIS — Z8546 Personal history of malignant neoplasm of prostate: Secondary | ICD-10-CM | POA: Insufficient documentation

## 2021-02-17 DIAGNOSIS — S8002XA Contusion of left knee, initial encounter: Secondary | ICD-10-CM | POA: Insufficient documentation

## 2021-02-17 DIAGNOSIS — Y92410 Unspecified street and highway as the place of occurrence of the external cause: Secondary | ICD-10-CM | POA: Diagnosis not present

## 2021-02-17 DIAGNOSIS — I10 Essential (primary) hypertension: Secondary | ICD-10-CM | POA: Insufficient documentation

## 2021-02-17 DIAGNOSIS — Z96652 Presence of left artificial knee joint: Secondary | ICD-10-CM | POA: Diagnosis not present

## 2021-02-17 DIAGNOSIS — I70202 Unspecified atherosclerosis of native arteries of extremities, left leg: Secondary | ICD-10-CM | POA: Diagnosis not present

## 2021-02-17 DIAGNOSIS — S8992XA Unspecified injury of left lower leg, initial encounter: Secondary | ICD-10-CM | POA: Diagnosis present

## 2021-02-17 DIAGNOSIS — E119 Type 2 diabetes mellitus without complications: Secondary | ICD-10-CM | POA: Diagnosis not present

## 2021-02-17 DIAGNOSIS — Z79899 Other long term (current) drug therapy: Secondary | ICD-10-CM | POA: Insufficient documentation

## 2021-02-17 DIAGNOSIS — R6889 Other general symptoms and signs: Secondary | ICD-10-CM | POA: Diagnosis not present

## 2021-02-17 DIAGNOSIS — Z743 Need for continuous supervision: Secondary | ICD-10-CM | POA: Diagnosis not present

## 2021-02-17 DIAGNOSIS — I708 Atherosclerosis of other arteries: Secondary | ICD-10-CM | POA: Diagnosis not present
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
## 2021-02-17 NOTE — Discharge Instructions (Signed)
X-ray was normal today.  However for the next few days you may be very sore with places that do not even hurt right now.  You can take extra strength Tylenol for this.
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
## 2021-02-17 NOTE — ED Notes (Signed)
Pt in bed , family at bedside. Denies any needs at this time.

## 2021-02-23 ENCOUNTER — Other Ambulatory Visit: Payer: Self-pay

## 2021-02-24 ENCOUNTER — Ambulatory Visit (INDEPENDENT_AMBULATORY_CARE_PROVIDER_SITE_OTHER): Payer: Medicare Other | Admitting: Family Medicine

## 2021-02-24 ENCOUNTER — Encounter: Payer: Self-pay | Admitting: Family Medicine

## 2021-02-24 VITALS — BP 138/80 | HR 70 | Temp 97.2°F | Ht 68.0 in | Wt 160.6 lb

## 2021-02-24 DIAGNOSIS — S8000XD Contusion of unspecified knee, subsequent encounter: Secondary | ICD-10-CM | POA: Diagnosis not present

## 2021-02-24 DIAGNOSIS — S81002D Unspecified open wound, left knee, subsequent encounter: Secondary | ICD-10-CM | POA: Diagnosis not present
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
## 2021-02-27 DIAGNOSIS — N3281 Overactive bladder: Secondary | ICD-10-CM | POA: Diagnosis not present

## 2021-02-27 DIAGNOSIS — R8271 Bacteriuria: Secondary | ICD-10-CM | POA: Diagnosis not present

## 2021-02-27 DIAGNOSIS — N3000 Acute cystitis without hematuria: Secondary | ICD-10-CM | POA: Diagnosis not present

## 2021-03-10 ENCOUNTER — Encounter: Payer: Self-pay | Admitting: Nurse Practitioner

## 2021-03-10 ENCOUNTER — Other Ambulatory Visit: Payer: Self-pay

## 2021-03-10 ENCOUNTER — Ambulatory Visit (INDEPENDENT_AMBULATORY_CARE_PROVIDER_SITE_OTHER): Payer: Medicare Other | Admitting: Nurse Practitioner

## 2021-03-10 VITALS — BP 126/80 | HR 89 | Temp 97.0°F | Wt 158.2 lb

## 2021-03-10 DIAGNOSIS — M2550 Pain in unspecified joint: Secondary | ICD-10-CM | POA: Diagnosis not present
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
## 2021-03-10 NOTE — Patient Instructions (Signed)
Continue use of meloxicam as prescribed You have a refill at the pharmacy.  Joint Pain  Joint pain can be caused by many things. It is likely to go away if you follow instructions from your doctor for taking care of yourself at home. Sometimes, you may need more treatment. Follow these instructions at home: Managing pain, stiffness, and swelling  If told, put ice on the painful area. To do this: ? If you have a removable elastic bandage, sling, or splint, take it off as told by your doctor. ? Put ice in a plastic bag. ? Place a towel between your skin and the bag. ? Leave the ice on for 20 minutes, 2-3 times a day. ? Take off the ice if your skin turns bright red. This is very important. If you cannot feel pain, heat, or cold, you have a greater risk of damage to the area.  Move your fingers or toes below the painful joint often.  Raise the painful joint above the level of your heart while you are sitting or lying down.  If told, put heat on the painful area. Do this as often as told by your doctor. Use the heat source that your doctor recommends, such as a moist heat pack or a heating pad. ? Place a towel between your skin and the heat source. ? Leave the heat on for 20-30 minutes. ? Take off the heat if your skin gets bright red. This is especially important if you are unable to feel pain, heat, or cold. You may have a greater risk of getting burned.      Activity  Rest the painful joint for as long as told by your doctor. Do not do things that cause pain or make your pain worse.  Begin exercising or stretching the affected area, as told by your doctor. Ask your doctor what types of exercise are safe for you.  Return to your normal activities when your doctor says that it is safe. If you have an elastic bandage, sling, or splint:  Wear it as told by your doctor. Take it only as told by your doctor.  Loosen it your fingers or toes below the joint: ? Tingle. ? Become  numb. ? Get cold and blue.  Keep it clean.  Ask your doctor if you should take it off before bathing.  If it is not waterproof: ? Do not let it get wet. ? Cover it with a watertight covering when you take a bath or shower. General instructions  Take over-the-counter and prescription medicines only as told by your doctor. This may include medicines taken by mouth or applied to the skin.  Do not smoke or use any products that contain nicotine or tobacco. If you need help quitting, ask your doctor.  Keep all follow-up visits as told by your doctor. This is important. Contact a doctor if:  You have pain that gets worse and does not get better with medicine.  Your joint pain does not get better in 3 days.  You have more bruising or swelling.  You have a fever.  You lose 10 lb (4.5 kg) or more without trying. Get help right away if:  You cannot move the joint.  Your fingers or toes tingle, become numb. or get cold and blue.  You have a fever along with a joint that is red, warm, and swollen. Summary  Joint pain can be caused by many things. It often goes away if you follow instructions from  your doctor for taking care of yourself at home.  Rest the painful joint for as long as told. Do not do things that cause pain or make your pain worse.  Take over-the-counter and prescription medicines only as told by your doctor. This information is not intended to replace advice given to you by your health care provider. Make sure you discuss any questions you have with your health care provider. Document Revised: 03/19/2020 Document Reviewed: 03/19/2020 Elsevier Patient Education  2021 [employer].

## 2021-03-23 ENCOUNTER — Other Ambulatory Visit: Payer: Self-pay

## 2021-03-25 ENCOUNTER — Other Ambulatory Visit: Payer: Self-pay | Admitting: Nurse Practitioner

## 2021-03-25 ENCOUNTER — Ambulatory Visit: Payer: Medicare Other | Admitting: Nurse Practitioner

## 2021-03-25 DIAGNOSIS — Z0289 Encounter for other administrative examinations: Secondary | ICD-10-CM

## 2021-03-25 DIAGNOSIS — G8929 Other chronic pain: Secondary | ICD-10-CM

## 2021-03-31 ENCOUNTER — Ambulatory Visit: Payer: Medicare Other | Admitting: Nurse Practitioner

## 2021-03-31 ENCOUNTER — Other Ambulatory Visit: Payer: Self-pay

## 2021-04-01 ENCOUNTER — Ambulatory Visit (INDEPENDENT_AMBULATORY_CARE_PROVIDER_SITE_OTHER): Payer: Medicare Other | Admitting: Family Medicine

## 2021-04-01 ENCOUNTER — Telehealth: Payer: Self-pay

## 2021-04-01 ENCOUNTER — Ambulatory Visit: Payer: Medicare Other | Admitting: Nurse Practitioner

## 2021-04-01 ENCOUNTER — Ambulatory Visit (INDEPENDENT_AMBULATORY_CARE_PROVIDER_SITE_OTHER): Payer: Medicare Other

## 2021-04-01 ENCOUNTER — Encounter: Payer: Self-pay | Admitting: Family Medicine

## 2021-04-01 VITALS — BP 122/70 | HR 72 | Temp 97.5°F | Ht 68.0 in | Wt 156.6 lb

## 2021-04-01 DIAGNOSIS — M25521 Pain in right elbow: Secondary | ICD-10-CM

## 2021-04-01 DIAGNOSIS — M778 Other enthesopathies, not elsewhere classified: Secondary | ICD-10-CM | POA: Diagnosis not present

## 2021-04-01 DIAGNOSIS — N401 Enlarged prostate with lower urinary tract symptoms: Secondary | ICD-10-CM

## 2021-04-01 DIAGNOSIS — N138 Other obstructive and reflux uropathy: Secondary | ICD-10-CM
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
## 2021-04-01 NOTE — Progress Notes (Signed)
    Chronic Care Management Pharmacy Assistant   Name: Tanner Griffin  MRN: [id number] DOB: 11/14/1941  Reason for Encounter: Medication Review/General Adherence Call.   Recent office visits:   02/05/2021 PCP Office [name] Change tamsulosin (FLOMAX) 0.4 MG CAPS capsule to one capsule in the morning and bedtime.  02/24/2021 PCP Office Arlester Marker  03/10/2021 PCP Office Wilfred Lacy  Recent consult visits:  No recent [facility] visits:  Medication Reconciliation was completed by comparing discharge summary, patient's EMR and Pharmacy list, and upon discussion with patient.  Admitted to the hospital on 02/17/2021 due to Contusion on left knee. Discharge date was 02/17/2021. Discharged from [facility]?Medications Started at [facility] Discharge:?? -started none  Medication Changes at Hospital Discharge: -Changed none  Medications Discontinued at Hospital Discharge: -Stopped none   Medications that remain the same after Hospital Discharge:??  -All other medications will remain the same.    Medications: Outpatient Encounter Medications as of 04/01/2021  Medication Sig  . alendronate (FOSAMAX) 70 MG tablet Take 1 tablet (70 mg total) by mouth every 7 (seven) days. Take with a full glass of water on an empty stomach.  [name] amLODipine (NORVASC) 10 MG tablet TAKE 1 TABLET BY MOUTH  DAILY  . atorvastatin (LIPITOR) 40 MG tablet TAKE 1 TABLET BY MOUTH  DAILY  . meloxicam (MOBIC) 15 MG tablet Take 1 tablet (15 mg total) by mouth daily.  . tamsulosin (FLOMAX) 0.4 MG CAPS capsule Take 1 capsule (0.4 mg total) by mouth in the morning and at bedtime.  . traMADol (ULTRAM) 50 MG tablet Take 50 mg by mouth 2 (two) times daily as needed.   No facility-administered encounter medications on file as of 04/01/2021.   Star Rating Drugs:  Atorvastatin 40 mg last filled on 01/22/2021 for 90 day supply.  Called patient and discussed medication  adherence  with patient, no issues at this time with current medication.   Patient denies ED visit since his last CPP follow up.  Patient denies any side effects with his medication. Patient denies any problems with his current pharmacy     Bessie Pelham Pharmacist Assistant [phone]

## 2021-04-01 NOTE — Patient Instructions (Signed)
Recommend physical therapy. Patient to check with his wife and decide if he wants to proceed with this.

## 2021-04-06 ENCOUNTER — Encounter: Payer: Self-pay | Admitting: Family Medicine

## 2021-04-06 DIAGNOSIS — M19021 Primary osteoarthritis, right elbow: Secondary | ICD-10-CM | POA: Insufficient documentation

## 2021-04-07 ENCOUNTER — Telehealth: Payer: Self-pay | Admitting: Family Medicine

## 2021-04-07 NOTE — Telephone Encounter (Signed)
[name] @[phone], was advised that patient has an appointment 04/13/21 to see Dr Ward Chatters & to have the foley cath changed.  Afer that appt they will set up home health to go out to do this for him.  Patient's wife, Tanner Griffin was advised of this when they spoke to her on 03/02/21.   Called Tanner Griffin (Wife) back to advise of this information. She asked what time the appt was with them and I advised her to call them I didn't get the time of the appt.  She will call them.  Dm/cma

## 2021-04-07 NOTE — Telephone Encounter (Signed)
Pt's wife(Mary) is calling in needing help with husband's foley bag. She was under the impression a nurse was goning to come out once a week to help with this matter.  Please advise at [phone].

## 2021-04-13 DIAGNOSIS — R3914 Feeling of incomplete bladder emptying: Secondary | ICD-10-CM | POA: Diagnosis not present

## 2021-04-19 DIAGNOSIS — Z87440 Personal history of urinary (tract) infections: Secondary | ICD-10-CM | POA: Diagnosis not present

## 2021-04-19 DIAGNOSIS — N138 Other obstructive and reflux uropathy: Secondary | ICD-10-CM | POA: Diagnosis not present

## 2021-04-19 DIAGNOSIS — N3281 Overactive bladder: Secondary | ICD-10-CM | POA: Diagnosis not present

## 2021-04-19 DIAGNOSIS — Z466 Encounter for fitting and adjustment of urinary device: Secondary | ICD-10-CM | POA: Diagnosis not present

## 2021-04-19 DIAGNOSIS — I10 Essential (primary) hypertension: Secondary | ICD-10-CM | POA: Diagnosis not present

## 2021-04-19 DIAGNOSIS — E785 Hyperlipidemia, unspecified: Secondary | ICD-10-CM | POA: Diagnosis not present

## 2021-04-19 DIAGNOSIS — R338 Other retention of urine: Secondary | ICD-10-CM | POA: Diagnosis not present

## 2021-04-19 DIAGNOSIS — G8929 Other chronic pain: Secondary | ICD-10-CM | POA: Diagnosis not present

## 2021-04-19 DIAGNOSIS — R3914 Feeling of incomplete bladder emptying: Secondary | ICD-10-CM | POA: Diagnosis not present

## 2021-04-19 DIAGNOSIS — R3912 Poor urinary stream: Secondary | ICD-10-CM | POA: Diagnosis not present

## 2021-04-21 NOTE — Addendum Note (Signed)
Addended by: Abelino Derrick A on: 04/21/2021 01:08 PM   Modules accepted: Orders

## 2021-05-12 ENCOUNTER — Ambulatory Visit (INDEPENDENT_AMBULATORY_CARE_PROVIDER_SITE_OTHER): Payer: Medicare Other | Admitting: Family Medicine

## 2021-05-12 ENCOUNTER — Other Ambulatory Visit: Payer: Self-pay

## 2021-05-12 ENCOUNTER — Encounter: Payer: Self-pay | Admitting: Family Medicine

## 2021-05-12 VITALS — BP 136/82 | HR 58 | Temp 97.6°F | Ht 68.0 in | Wt 154.6 lb

## 2021-05-12 DIAGNOSIS — Z96653 Presence of artificial knee joint, bilateral: Secondary | ICD-10-CM | POA: Diagnosis not present

## 2021-05-12 DIAGNOSIS — R29898 Other symptoms and signs involving the musculoskeletal system: Secondary | ICD-10-CM

## 2021-05-12 NOTE — Progress Notes (Signed)
[facility] [address] [place] [address] Dept: [phone] Dept Fax: [phone]  Office Visit  Subjective:    Patient ID: Tanner Griffin, male    DOB: April 10, 1941, 80 y.o..   MRN: [id number]  Chief Complaint  Patient presents with  . Acute Visit    F/u pain in both legs x 4 days. He states that he couldn't move either legs and had to call the EMS to help him get in the house.      History of Present Illness:  Patient is in today noting a recent episode (4 days ago) where he was unable to stand on his own. He notes that this had happened one time before several months ago. On this occasion, he had gone out driving in his truck. When he returned home, he sat down on a bench near the front door. He was then unable to get back up. He did not feel he was having pain, but describes this as his knees locking up. His wife called and a fireman came and picked him up to get in the house. He notes he slept for 2 hours and then was okay. Mr. Kuzniar has had the right knee joint replaced in 2011 and the left replaced in 2015. At the time of the left knee replacement, he had developed scar tissue around the right knee joint, so had manipulation of the right knee joint to free this up. Mr. Quinley uses a wheeled walker to assist ambulation. He notes that he can stand with the walker for a bit of time, but eventually he fatigues from this.  Past Medical History: Patient Active Problem List   Diagnosis Date Noted  . Degenerative arthritis of right elbow 04/06/2021  . Distal radius fracture, right 05/30/2020  . Macrocytic anemia 03/31/2020  . Paget's disease of the bone 12/10/2019  . Acute left-sided low back pain without sciatica 11/29/2019  . Malignant neoplasm of prostate ([place]) 02/12/2019  . Lumbar spinal stenosis 09/25/2018  . Right lumbar radiculopathy 09/05/2018  . Polyneuropathy 09/05/2018  . Right leg weakness 07/26/2018  . Pain in  both hands 03/29/2018  . Elevated PSA 03/16/2018  . Right inguinal hernia 03/14/2017  . Degenerative disc disease, lumbar 01/11/2017  . Polyarthralgia 04/26/2016  . Pain due to total knee replacement ([place]) 04/26/2016  . Constipation 01/20/2015  . Focal myoclonus 06/05/2014  . Osteoarthritis of knee s/p bilateral knee replacements 03/26/2014  . Spongiotic dermatitis 03/19/2013  . Personal history of colonic adenoma 10/30/2012  . BPH with obstruction/lower urinary tract symptoms 06/05/2012  . Right knee pain 04/17/2011  . Type II diabetes mellitus, well controlled ([place]) 02/12/2008  . Hyperlipidemia 06/19/2007  . Essential hypertension 06/19/2007   Past Surgical History:  Procedure Laterality Date  . COLONOSCOPY    . HERNIA REPAIR     inguinal 04/14/17  . INGUINAL HERNIA REPAIR Right 04/14/2017   Procedure: RIGHT INGUINAL HERNIA REPAIR WITH MESH;  Surgeon: Armandina Gemma, MD;  [facility];  Service: General;  Laterality: Right;  . INSERTION OF MESH Right 04/14/2017   Procedure: INSERTION OF MESH;  Surgeon: Armandina Gemma, MD;  [facility];  Service: General;  Laterality: Right;  . KNEE CLOSED REDUCTION Right 03/26/2014   Procedure: CLOSED MANIPULATION KNEE;  Surgeon: [name], MD;  Location: [facility];  Service: Orthopedics;  Laterality: Right;  . RADIOACTIVE SEED IMPLANT N/A 06/15/2019   Procedure: RADIOACTIVE SEED IMPLANT/BRACHYTHERAPY IMPLANT;  Surgeon: [name], MD;  Location: [facility];  Service: Urology;  Laterality: N/A;  . SPACE OAR INSTILLATION N/A 06/15/2019   Procedure: SPACE OAR INSTILLATION;  Surgeon: [name], MD;  Location: [facility];  Service: Urology;  Laterality: N/A;  . TOTAL KNEE ARTHROPLASTY  2011   right, Dr. [name] per pt  . TOTAL KNEE ARTHROPLASTY Left 03/26/2014   DR LANDAU  . TOTAL KNEE ARTHROPLASTY Left 03/26/2014   Procedure: TOTAL KNEE ARTHROPLASTY;  Surgeon: [name], MD;  Location: [facility];  Service:  Orthopedics;  Laterality: Left;   Family History  Problem Relation Age of Onset  . Brain cancer Father   . Renal Disease Brother        ESRD, unknown cause  . Stomach cancer Neg Hx   . Esophageal cancer Neg Hx   . Rectal cancer Neg Hx   . Breast cancer Neg Hx   . Colon cancer Neg Hx   . Pancreatic cancer Neg Hx    Outpatient Medications Prior to Visit  Medication Sig Dispense Refill  . alendronate (FOSAMAX) 70 MG tablet Take 1 tablet (70 mg total) by mouth every 7 (seven) days. Take with a full glass of water on an empty stomach. 12 tablet 4  . amLODipine (NORVASC) 10 MG tablet TAKE 1 TABLET BY MOUTH  DAILY 90 tablet 3  . atorvastatin (LIPITOR) 40 MG tablet TAKE 1 TABLET BY MOUTH  DAILY 90 tablet 3  . meloxicam (MOBIC) 15 MG tablet Take 1 tablet (15 mg total) by mouth daily. 90 tablet 1  . tamsulosin (FLOMAX) 0.4 MG CAPS capsule Take 1 capsule (0.4 mg total) by mouth in the morning and at bedtime. 180 capsule 1  . traMADol (ULTRAM) 50 MG tablet Take 50 mg by mouth 2 (two) times daily as needed.     No facility-administered medications prior to visit.   Allergies  Allergen Reactions  . Telmisartan Other (See Comments)    Reaction:  Headache     Objective:   Today's Vitals   05/12/21 1129  BP: 136/82  Pulse: (!) 58  Temp: 97.6 F (36.4 C)  TempSrc: Temporal  SpO2: 99%  Weight: 154 lb 9.6 oz (70.1 kg)  Height: 5\' 8"  (1.727 m)   Body mass index is 23.51 kg/m.   General: Thin, elderly male. No acute distress. Extremities: There is muscular atrophy of both hands (esp. 1st dorsal interosseous muscles). The right knee has some mild  crepitance, despite the artificial joint. There is no peripheral edema. Neuro:Patient is able to get up to standing with assistance by using his arms. His gait is a bit wide spaced. Psych: Alert and oriented. Normal mood and affect.  Health Maintenance Due  Topic Date Due  . Hepatitis C Screening  Never done  . FOOT EXAM  10/16/2016  .  OPHTHALMOLOGY EXAM  11/18/2016  . HEMOGLOBIN A1C  09/15/2018  . URINE MICROALBUMIN  03/16/2019  . TETANUS/TDAP  07/04/2019  . COVID-19 Vaccine (4 - Booster for Pfizer series) 02/12/2021     Assessment & Plan:   1. Muscular deconditioning I suspect there is an aspect of muscular deconditioning over time related to aging, neuropathy, and arthritis. I will refer him to physical therapy for evaluation and strengthening to help him maintain independence.  - Ambulatory referral to Physical Therapy  2. Knee joint replacement status, bilateral I will also have him reassessed by Dr. Mardelle Matte to assure his artifical joints are still funcitoning wella nd that there has not been  recurrence of restricting scar tissue.  - Ambulatory referral to Physical Therapy - Ambulatory referral to Orthopedic Surgery  Haydee Salter, MD

## 2021-05-14 DIAGNOSIS — E785 Hyperlipidemia, unspecified: Secondary | ICD-10-CM | POA: Diagnosis not present

## 2021-05-14 DIAGNOSIS — G8929 Other chronic pain: Secondary | ICD-10-CM | POA: Diagnosis not present

## 2021-05-14 DIAGNOSIS — N3281 Overactive bladder: Secondary | ICD-10-CM | POA: Diagnosis not present

## 2021-05-14 DIAGNOSIS — Z466 Encounter for fitting and adjustment of urinary device: Secondary | ICD-10-CM | POA: Diagnosis not present

## 2021-05-14 DIAGNOSIS — N138 Other obstructive and reflux uropathy: Secondary | ICD-10-CM | POA: Diagnosis not present

## 2021-05-14 DIAGNOSIS — R338 Other retention of urine: Secondary | ICD-10-CM | POA: Diagnosis not present

## 2021-05-14 DIAGNOSIS — R3912 Poor urinary stream: Secondary | ICD-10-CM | POA: Diagnosis not present

## 2021-05-14 DIAGNOSIS — R3914 Feeling of incomplete bladder emptying: Secondary | ICD-10-CM | POA: Diagnosis not present

## 2021-05-14 DIAGNOSIS — Z87440 Personal history of urinary (tract) infections: Secondary | ICD-10-CM | POA: Diagnosis not present

## 2021-05-14 DIAGNOSIS — I10 Essential (primary) hypertension: Secondary | ICD-10-CM | POA: Diagnosis not present

## 2021-06-14 DIAGNOSIS — Z87440 Personal history of urinary (tract) infections: Secondary | ICD-10-CM | POA: Diagnosis not present

## 2021-06-14 DIAGNOSIS — I10 Essential (primary) hypertension: Secondary | ICD-10-CM | POA: Diagnosis not present

## 2021-06-14 DIAGNOSIS — R3914 Feeling of incomplete bladder emptying: Secondary | ICD-10-CM | POA: Diagnosis not present

## 2021-06-14 DIAGNOSIS — G8929 Other chronic pain: Secondary | ICD-10-CM | POA: Diagnosis not present

## 2021-06-14 DIAGNOSIS — R3912 Poor urinary stream: Secondary | ICD-10-CM | POA: Diagnosis not present

## 2021-06-14 DIAGNOSIS — N138 Other obstructive and reflux uropathy: Secondary | ICD-10-CM | POA: Diagnosis not present

## 2021-06-14 DIAGNOSIS — E785 Hyperlipidemia, unspecified: Secondary | ICD-10-CM | POA: Diagnosis not present

## 2021-06-14 DIAGNOSIS — N3281 Overactive bladder: Secondary | ICD-10-CM | POA: Diagnosis not present

## 2021-06-14 DIAGNOSIS — Z466 Encounter for fitting and adjustment of urinary device: Secondary | ICD-10-CM | POA: Diagnosis not present

## 2021-06-14 DIAGNOSIS — R338 Other retention of urine: Secondary | ICD-10-CM | POA: Diagnosis not present

## 2021-06-20 ENCOUNTER — Other Ambulatory Visit: Payer: Self-pay | Admitting: Family Medicine

## 2021-06-20 DIAGNOSIS — M25561 Pain in right knee: Secondary | ICD-10-CM

## 2021-07-15 ENCOUNTER — Ambulatory Visit: Payer: Medicare Other | Admitting: Family Medicine

## 2021-07-16 ENCOUNTER — Telehealth: Payer: Self-pay | Admitting: Family Medicine

## 2021-07-16 NOTE — Telephone Encounter (Signed)
Spoke to Tanner Griffin, patient's wife,  after looking into the chart I was able to find a note that stated that [facility] was going to set up the home health for him. Advised her to call them and she stated that she had received a call from a Mr [name] that was trying to reach them to come out to the house.  No further questions. Dm/cma

## 2021-07-16 NOTE — Telephone Encounter (Signed)
[employer] is wanting a call placed to pt's wife, she needs to know the name of the [employer]  [facility] pt uses. The Home health care did not come this month. She is wanting to call them. Please advise pt's wife(Mary) at [phone].

## 2021-07-17 DIAGNOSIS — R3914 Feeling of incomplete bladder emptying: Secondary | ICD-10-CM | POA: Diagnosis not present

## 2021-07-17 DIAGNOSIS — I10 Essential (primary) hypertension: Secondary | ICD-10-CM | POA: Diagnosis not present

## 2021-07-17 DIAGNOSIS — Z87440 Personal history of urinary (tract) infections: Secondary | ICD-10-CM | POA: Diagnosis not present

## 2021-07-17 DIAGNOSIS — R3912 Poor urinary stream: Secondary | ICD-10-CM | POA: Diagnosis not present

## 2021-07-17 DIAGNOSIS — R338 Other retention of urine: Secondary | ICD-10-CM | POA: Diagnosis not present

## 2021-07-17 DIAGNOSIS — N3281 Overactive bladder: Secondary | ICD-10-CM | POA: Diagnosis not present

## 2021-07-17 DIAGNOSIS — E785 Hyperlipidemia, unspecified: Secondary | ICD-10-CM | POA: Diagnosis not present

## 2021-07-17 DIAGNOSIS — N138 Other obstructive and reflux uropathy: Secondary | ICD-10-CM | POA: Diagnosis not present

## 2021-07-17 DIAGNOSIS — G8929 Other chronic pain: Secondary | ICD-10-CM | POA: Diagnosis not present

## 2021-07-17 DIAGNOSIS — Z466 Encounter for fitting and adjustment of urinary device: Secondary | ICD-10-CM | POA: Diagnosis not present

## 2021-07-24 ENCOUNTER — Telehealth: Payer: Self-pay

## 2021-07-24 NOTE — Progress Notes (Signed)
Unable to leave a voice message due to no mailbox to  confirmed patient telephone appointment on 07/27/2021 for CCM at 1:00 pm with Junius Argyle the Clinical pharmacist.   [place] Pharmacist Assistant [phone]

## 2021-07-27 ENCOUNTER — Telehealth: Payer: Medicare Other

## 2021-08-05 ENCOUNTER — Telehealth: Payer: Self-pay | Admitting: Family Medicine

## 2021-08-05 NOTE — Telephone Encounter (Signed)
Spoke to patient spouse and spouse stated patient has no swelling, redness, or discharge at catheter site, no blood in urine. Explained I will notify pt provider of urinary catheter discomfort and cb when the provider has confirmed next steps, also explained if patient has signs of infection that it is recommended to go to urgent care or ED if symptoms worsens. Spouse voiced understanding. Sw, cma

## 2021-08-05 NOTE — Telephone Encounter (Signed)
Pts wife called this morning, Mr Sliker is having issues with his catheter. It is making him uncomfortable. They have Home Health that comes and changes it, but she cannot remember who it is. Would like to speak with a nurse.

## 2021-08-05 NOTE — Telephone Encounter (Signed)
Pt wife called back and I transferred to [facility]

## 2021-08-12 DIAGNOSIS — N138 Other obstructive and reflux uropathy: Secondary | ICD-10-CM | POA: Diagnosis not present

## 2021-08-12 DIAGNOSIS — Z87440 Personal history of urinary (tract) infections: Secondary | ICD-10-CM | POA: Diagnosis not present

## 2021-08-12 DIAGNOSIS — Z466 Encounter for fitting and adjustment of urinary device: Secondary | ICD-10-CM | POA: Diagnosis not present

## 2021-08-12 DIAGNOSIS — I10 Essential (primary) hypertension: Secondary | ICD-10-CM | POA: Diagnosis not present

## 2021-08-12 DIAGNOSIS — R338 Other retention of urine: Secondary | ICD-10-CM | POA: Diagnosis not present

## 2021-08-12 DIAGNOSIS — E785 Hyperlipidemia, unspecified: Secondary | ICD-10-CM | POA: Diagnosis not present

## 2021-08-12 DIAGNOSIS — N3281 Overactive bladder: Secondary | ICD-10-CM | POA: Diagnosis not present

## 2021-08-12 DIAGNOSIS — G8929 Other chronic pain: Secondary | ICD-10-CM | POA: Diagnosis not present

## 2021-08-12 DIAGNOSIS — R3914 Feeling of incomplete bladder emptying: Secondary | ICD-10-CM | POA: Diagnosis not present

## 2021-08-12 DIAGNOSIS — R3912 Poor urinary stream: Secondary | ICD-10-CM | POA: Diagnosis not present

## 2021-08-27 DIAGNOSIS — R3914 Feeling of incomplete bladder emptying: Secondary | ICD-10-CM | POA: Diagnosis not present

## 2021-08-27 DIAGNOSIS — R3912 Poor urinary stream: Secondary | ICD-10-CM | POA: Diagnosis not present

## 2021-08-27 DIAGNOSIS — R338 Other retention of urine: Secondary | ICD-10-CM | POA: Diagnosis not present

## 2021-08-27 DIAGNOSIS — G8929 Other chronic pain: Secondary | ICD-10-CM | POA: Diagnosis not present

## 2021-08-27 DIAGNOSIS — Z466 Encounter for fitting and adjustment of urinary device: Secondary | ICD-10-CM | POA: Diagnosis not present

## 2021-08-27 DIAGNOSIS — E7800 Pure hypercholesterolemia, unspecified: Secondary | ICD-10-CM | POA: Diagnosis not present

## 2021-08-27 DIAGNOSIS — Z87440 Personal history of urinary (tract) infections: Secondary | ICD-10-CM | POA: Diagnosis not present

## 2021-08-27 DIAGNOSIS — I10 Essential (primary) hypertension: Secondary | ICD-10-CM | POA: Diagnosis not present

## 2021-08-27 DIAGNOSIS — N138 Other obstructive and reflux uropathy: Secondary | ICD-10-CM | POA: Diagnosis not present

## 2021-08-27 DIAGNOSIS — N3281 Overactive bladder: Secondary | ICD-10-CM | POA: Diagnosis not present

## 2021-09-16 ENCOUNTER — Other Ambulatory Visit: Payer: Self-pay

## 2021-09-16 ENCOUNTER — Ambulatory Visit (INDEPENDENT_AMBULATORY_CARE_PROVIDER_SITE_OTHER): Payer: Medicare Other

## 2021-09-16 DIAGNOSIS — Z23 Encounter for immunization: Secondary | ICD-10-CM

## 2021-09-16 NOTE — Progress Notes (Signed)
After obtaining informed consent, the immunization is given by Marchia Bond, [place]. Pt tolerated injection well.

## 2021-10-08 DIAGNOSIS — G8929 Other chronic pain: Secondary | ICD-10-CM | POA: Diagnosis not present

## 2021-10-08 DIAGNOSIS — Z466 Encounter for fitting and adjustment of urinary device: Secondary | ICD-10-CM | POA: Diagnosis not present

## 2021-10-08 DIAGNOSIS — R3912 Poor urinary stream: Secondary | ICD-10-CM | POA: Diagnosis not present

## 2021-10-08 DIAGNOSIS — R3914 Feeling of incomplete bladder emptying: Secondary | ICD-10-CM | POA: Diagnosis not present

## 2021-10-08 DIAGNOSIS — E7800 Pure hypercholesterolemia, unspecified: Secondary | ICD-10-CM | POA: Diagnosis not present

## 2021-10-08 DIAGNOSIS — R338 Other retention of urine: Secondary | ICD-10-CM | POA: Diagnosis not present

## 2021-10-08 DIAGNOSIS — N3281 Overactive bladder: Secondary | ICD-10-CM | POA: Diagnosis not present

## 2021-10-08 DIAGNOSIS — N138 Other obstructive and reflux uropathy: Secondary | ICD-10-CM | POA: Diagnosis not present

## 2021-10-08 DIAGNOSIS — Z87440 Personal history of urinary (tract) infections: Secondary | ICD-10-CM | POA: Diagnosis not present

## 2021-10-08 DIAGNOSIS — I10 Essential (primary) hypertension: Secondary | ICD-10-CM | POA: Diagnosis not present

## 2021-10-14 DIAGNOSIS — N138 Other obstructive and reflux uropathy: Secondary | ICD-10-CM | POA: Diagnosis not present

## 2021-10-14 DIAGNOSIS — I10 Essential (primary) hypertension: Secondary | ICD-10-CM | POA: Diagnosis not present

## 2021-10-14 DIAGNOSIS — E7800 Pure hypercholesterolemia, unspecified: Secondary | ICD-10-CM | POA: Diagnosis not present

## 2021-10-14 DIAGNOSIS — R3914 Feeling of incomplete bladder emptying: Secondary | ICD-10-CM | POA: Diagnosis not present

## 2021-10-14 DIAGNOSIS — R3912 Poor urinary stream: Secondary | ICD-10-CM | POA: Diagnosis not present

## 2021-10-14 DIAGNOSIS — Z87440 Personal history of urinary (tract) infections: Secondary | ICD-10-CM | POA: Diagnosis not present

## 2021-10-14 DIAGNOSIS — G8929 Other chronic pain: Secondary | ICD-10-CM | POA: Diagnosis not present

## 2021-10-14 DIAGNOSIS — R338 Other retention of urine: Secondary | ICD-10-CM | POA: Diagnosis not present

## 2021-10-14 DIAGNOSIS — N3281 Overactive bladder: Secondary | ICD-10-CM | POA: Diagnosis not present

## 2021-10-14 DIAGNOSIS — Z466 Encounter for fitting and adjustment of urinary device: Secondary | ICD-10-CM | POA: Diagnosis not present

## 2021-10-25 DIAGNOSIS — R3912 Poor urinary stream: Secondary | ICD-10-CM | POA: Diagnosis not present

## 2021-10-25 DIAGNOSIS — R3914 Feeling of incomplete bladder emptying: Secondary | ICD-10-CM | POA: Diagnosis not present

## 2021-10-25 DIAGNOSIS — Z87440 Personal history of urinary (tract) infections: Secondary | ICD-10-CM | POA: Diagnosis not present

## 2021-10-25 DIAGNOSIS — R338 Other retention of urine: Secondary | ICD-10-CM | POA: Diagnosis not present

## 2021-10-25 DIAGNOSIS — N3281 Overactive bladder: Secondary | ICD-10-CM | POA: Diagnosis not present

## 2021-10-25 DIAGNOSIS — E7800 Pure hypercholesterolemia, unspecified: Secondary | ICD-10-CM | POA: Diagnosis not present

## 2021-10-25 DIAGNOSIS — N138 Other obstructive and reflux uropathy: Secondary | ICD-10-CM | POA: Diagnosis not present

## 2021-10-25 DIAGNOSIS — Z466 Encounter for fitting and adjustment of urinary device: Secondary | ICD-10-CM | POA: Diagnosis not present

## 2021-10-25 DIAGNOSIS — I10 Essential (primary) hypertension: Secondary | ICD-10-CM | POA: Diagnosis not present

## 2021-10-25 DIAGNOSIS — G8929 Other chronic pain: Secondary | ICD-10-CM | POA: Diagnosis not present

## 2021-10-27 ENCOUNTER — Ambulatory Visit: Payer: Medicare Other | Admitting: Nurse Practitioner

## 2021-10-28 ENCOUNTER — Ambulatory Visit (INDEPENDENT_AMBULATORY_CARE_PROVIDER_SITE_OTHER): Payer: Medicare Other | Admitting: Nurse Practitioner

## 2021-10-28 ENCOUNTER — Other Ambulatory Visit: Payer: Self-pay

## 2021-10-28 VITALS — BP 114/66 | HR 68 | Temp 97.7°F | Resp 14 | Ht 68.0 in | Wt 158.1 lb

## 2021-10-28 DIAGNOSIS — Z23 Encounter for immunization: Secondary | ICD-10-CM | POA: Insufficient documentation

## 2021-10-28 DIAGNOSIS — H6123 Impacted cerumen, bilateral: Secondary | ICD-10-CM | POA: Diagnosis not present

## 2021-10-28 NOTE — Progress Notes (Signed)
Acute Office Visit  Subjective:    Patient ID: Tanner Griffin, male    DOB: 08-20-1941, 80 y.o.   MRN: [id number]  Chief Complaint  Patient presents with   decreased hearing    Feels like he has a lot of wax. No pain or ringing in the ears.    HPI Patient is in today for Decreased hearing  States that he needs his ears cleaned Happens every year. Does endorse some decreased hearing. No other complaints  Would also like the shingles vaccine. Has not had the vaccine  Past Medical History:  Diagnosis Date   Arthritis    Arthrofibrosis of total knee arthroplasty, right 03/26/2014   Constipation    takes Miralax daily as needed   Headache    occasional   HYPERLIPIDEMIA    takes Lipitor daily   HYPERTENSION    takes Amlodipine and Labetalol daily   Joint pain    Joint swelling    Nocturia    Osteoarthritis of left knee 03/26/2014   Pneumonia    hx of-as a child   Prostate cancer ([place])    Urinary frequency    takes Flomax daily   Wears eyeglasses    Wears partial dentures    top     Past Surgical History:  Procedure Laterality Date   COLONOSCOPY     HERNIA REPAIR     inguinal 04/14/17   INGUINAL HERNIA REPAIR Right 04/14/2017   Procedure: RIGHT INGUINAL HERNIA REPAIR WITH MESH;  Surgeon: Armandina Gemma, MD;  Location: [facility];  Service: General;  Laterality: Right;   INSERTION OF MESH Right 04/14/2017   Procedure: INSERTION OF MESH;  Surgeon: Armandina Gemma, MD;  Location: [facility];  Service: General;  Laterality: Right;   KNEE CLOSED REDUCTION Right 03/26/2014   Procedure: CLOSED MANIPULATION KNEE;  Surgeon: [name], MD;  Location: [place];  Service: Orthopedics;  Laterality: Right;   RADIOACTIVE SEED IMPLANT N/A 06/15/2019   Procedure: RADIOACTIVE SEED IMPLANT/BRACHYTHERAPY IMPLANT;  Surgeon: [name], MD;  Location: [place];  Service: Urology;  Laterality: N/A;   SPACE OAR INSTILLATION N/A 06/15/2019   Procedure: SPACE OAR INSTILLATION;  Surgeon:  [name], MD;  Location[facility];  Service: Urology;  Laterality: N/A;   TOTAL KNEE ARTHROPLASTY  2011   right, Dr. [name] per pt   TOTAL KNEE ARTHROPLASTY Left 03/26/2014   Procedure: TOTAL KNEE ARTHROPLASTY;  Surgeon: [name], MD;  Location: [place];  Service: Orthopedics;  Laterality: Left;    Family History  Problem Relation Age of Onset   Brain cancer Father    Renal Disease Brother        ESRD, unknown cause   Stomach cancer Neg Hx    Esophageal cancer Neg Hx    Rectal cancer Neg Hx    Breast cancer Neg Hx    Colon cancer Neg Hx    Pancreatic cancer Neg Hx     Social History   Socioeconomic History   Marital status: Married    Spouse name: [place]   Number of children: 5   Years of education: 9th   Highest education level: Not on file  Occupational History   Occupation: retired    Comment: Architect  Tobacco Use   Smoking status: Never   Smokeless tobacco: Never  Vaping Use   Vaping Use: Never used  Substance and Sexual Activity   Alcohol use: No    Alcohol/week: 0.0 standard drinks  Drug use: No   Sexual activity: Not Currently  Other Topics Concern   Not on file  Social History Narrative   Married, together 43 years in 2016. 1 son, 2 daughters. Can't count # grandkids at least 10, 11. 1 great-granddaughter.   Resides in [place], on [place].   Grandson and daughter live with them.   Retired from Architect work.   Hobbies: time with grand kids-play basketball.   Social Determinants of Health   Financial Resource Strain: Not on file  Food Insecurity: Not on file  Transportation Needs: Not on file  Physical Activity: Not on file  Stress: Not on file  Social Connections: Not on file  Intimate Partner Violence: Not on file    Outpatient Medications Prior to Visit  Medication Sig Dispense Refill   alendronate (FOSAMAX) 70 MG tablet Take 1 tablet (70 mg total) by mouth every 7 (seven) days. Take with a full  glass of water on an empty stomach. 12 tablet 4   amLODipine (NORVASC) 10 MG tablet TAKE 1 TABLET BY MOUTH  DAILY 90 tablet 3   atorvastatin (LIPITOR) 40 MG tablet TAKE 1 TABLET BY MOUTH  DAILY 90 tablet 3   meloxicam (MOBIC) 15 MG tablet TAKE 1 TABLET(15 MG) BY MOUTH DAILY 90 tablet 1   tamsulosin (FLOMAX) 0.4 MG CAPS capsule Take 1 capsule (0.4 mg total) by mouth in the morning and at bedtime. 180 capsule 1   traMADol (ULTRAM) 50 MG tablet Take 50 mg by mouth 2 (two) times daily as needed.     No facility-administered medications prior to visit.    Allergies  Allergen Reactions   Telmisartan Other (See Comments)    Reaction:  Headache     Review of Systems  Constitutional:  Negative for chills and fever.       Patient uses a walker   HENT:  Negative for ear discharge, ear pain and tinnitus (decreased hearing).   Respiratory:  Negative for cough and shortness of breath.   Cardiovascular:  Negative for chest pain.  Gastrointestinal:  Negative for diarrhea, nausea and vomiting.      Objective:    Physical Exam Vitals and nursing note reviewed.  Constitutional:      Appearance: Normal appearance.  HENT:     Right Ear: External ear normal. There is impacted cerumen.     Left Ear: External ear normal. There is impacted cerumen.     Ears:     Comments: After obtaining verbal consent patient was prepared for irrigation using earwax softening eardrops after they were in the canal and rested per office policy mixture of water and hydrogen peroxide was used to irrigate patient's ears patient tolerated semiwell did complain of some discomfort while irrigation was going on.  Unable to clear cerumen impaction laterally. Cardiovascular:     Rate and Rhythm: Normal rate and regular rhythm.  Pulmonary:     Effort: Pulmonary effort is normal.     Breath sounds: Normal breath sounds.  Abdominal:     General: Bowel sounds are normal. There is no distension.     Tenderness: There is no  abdominal tenderness.  Lymphadenopathy:     Cervical: No cervical adenopathy.  Neurological:     Mental Status: He is alert.  Psychiatric:        Mood and Affect: Mood normal.        Behavior: Behavior normal.        Thought Content: Thought content normal.  Judgment: Judgment normal.    BP 114/66   Pulse 68   Temp 97.7 F (36.5 C)   Resp 14   Ht 5\' 8"  (1.727 m)   Wt 158 lb 2 oz (71.7 kg)   SpO2 99%   BMI 24.04 kg/m  Wt Readings from Last 3 Encounters:  10/28/21 158 lb 2 oz (71.7 kg)  05/12/21 154 lb 9.6 oz (70.1 kg)  04/01/21 156 lb 9.6 oz (71 kg)    Health Maintenance Due  Topic Date Due   Hepatitis C Screening  Never done   Zoster Vaccines- Shingrix (1 of 2) Never done   FOOT EXAM  10/16/2016   OPHTHALMOLOGY EXAM  11/18/2016   HEMOGLOBIN A1C  09/15/2018   URINE MICROALBUMIN  03/16/2019   TETANUS/TDAP  07/04/2019   COVID-19 Vaccine (4 - Booster for [place] series) 01/07/2021    There are no preventive care reminders to display for this patient.   Lab Results  Component Value Date   TSH 1.03 03/15/2018   Lab Results  Component Value Date   WBC 4.5 04/23/2020   HGB 11.9 (L) 04/23/2020   HCT 36.3 (L) 04/23/2020   MCV 100.0 04/23/2020   PLT 253 04/23/2020   Lab Results  Component Value Date   NA 142 04/23/2020   K 4.1 04/23/2020   CO2 24 04/23/2020   GLUCOSE 103 (H) 04/23/2020   BUN 10 04/23/2020   CREATININE 0.94 04/23/2020   BILITOT 1.0 03/31/2020   ALKPHOS 149 (H) 03/31/2020   AST 25 03/31/2020   ALT 21 03/31/2020   PROT 6.8 03/31/2020   ALBUMIN 4.2 03/31/2020   CALCIUM 9.4 04/23/2020   ANIONGAP 9 04/23/2020   GFR 113.42 03/31/2020   Lab Results  Component Value Date   CHOL 139 03/15/2018   Lab Results  Component Value Date   HDL 46.30 03/15/2018   Lab Results  Component Value Date   LDLCALC 77 03/15/2018   Lab Results  Component Value Date   TRIG 77.0 03/15/2018   Lab Results  Component Value Date   CHOLHDL 3 03/15/2018    Lab Results  Component Value Date   HGBA1C 5.7 03/15/2018       Assessment & Plan:   Problem List Items Addressed This Visit       Nervous and Auditory   Bilateral impacted cerumen - Primary    We will was unable to clear cerumen impaction bilaterally.  Patient cannot tolerate anymore.  Cerumen does look dry on examination have patient use Debrox for the next 2 days at home follow-up in office for repeat irrigation to see if we can clear cerumen impaction.  If not we will refer patient to ENT.  Patient was made aware of treatment plan and goals.  Pending follow-up      Relevant Orders   Ear Lavage     Other   Need for shingles vaccine    Personal immunization records checked along with [facility].  Patient has not had shingles vaccination yet.  Administer first dose in office today.        No orders of the defined types were placed in this encounter.  This visit occurred during the SARS-CoV-2 public health emergency.  Safety protocols were in place, including screening questions prior to the visit, additional usage of staff PPE, and extensive cleaning of exam room while observing appropriate contact time as indicated for disinfecting solutions.   Romilda Garret, NP

## 2021-10-28 NOTE — Assessment & Plan Note (Signed)
We will was unable to clear cerumen impaction bilaterally.  Patient cannot tolerate anymore.  Cerumen does look dry on examination have patient use Debrox for the next 2 days at home follow-up in office for repeat irrigation to see if we can clear cerumen impaction.  If not we will refer patient to ENT.  Patient was made aware of treatment plan and goals.  Pending follow-up

## 2021-10-28 NOTE — Assessment & Plan Note (Signed)
Personal immunization records checked along with [facility].  Patient has not had shingles vaccination yet.  Administer first dose in office today.

## 2021-10-28 NOTE — Patient Instructions (Signed)
They make a medication called DeBrox ear drop over the counter. Use those as directed for the next 2 days and then come back and see [place] to see if we can get the ear wax out. We can always send you to an Ear nose and throat doctor if we cannot get it to come out.  Need to follow up in 2-6 months for the next shingles shot to complete the series

## 2021-10-30 ENCOUNTER — Ambulatory Visit: Payer: Medicare Other | Admitting: Nurse Practitioner

## 2021-11-02 ENCOUNTER — Other Ambulatory Visit: Payer: Self-pay

## 2021-11-02 ENCOUNTER — Encounter: Payer: Self-pay | Admitting: Family Medicine

## 2021-11-02 ENCOUNTER — Ambulatory Visit (INDEPENDENT_AMBULATORY_CARE_PROVIDER_SITE_OTHER): Payer: Medicare Other | Admitting: Family Medicine

## 2021-11-02 VITALS — BP 150/82 | HR 60 | Temp 97.6°F | Ht 68.0 in | Wt 160.8 lb

## 2021-11-02 DIAGNOSIS — H6123 Impacted cerumen, bilateral: Secondary | ICD-10-CM

## 2021-11-02 NOTE — Progress Notes (Signed)
Established Patient Office Visit  Subjective:  Patient ID: Tanner Griffin, male    DOB: November 19, 1941  Age: 80 y.o. MRN: [id number]  CC:  Chief Complaint  Patient presents with   Acute Visit    Wants to have his ears cleaned out.      HPI MAESON PUROHIT presents for follow-up of cerumen gnosis.  Status post irrigation a few days ago.  Unable to clear ears at that time.  Closed prescribed Debrox drops and has been using them.  Here for further irrigation.  Past Medical History:  Diagnosis Date   Arthritis    Arthrofibrosis of total knee arthroplasty, right 03/26/2014   Constipation    takes Miralax daily as needed   Headache    occasional   HYPERLIPIDEMIA    takes Lipitor daily   HYPERTENSION    takes Amlodipine and Labetalol daily   Joint pain    Joint swelling    Nocturia    Osteoarthritis of left knee 03/26/2014   Pneumonia    hx of-as a child   Prostate cancer ([place])    Urinary frequency    takes Flomax daily   Wears eyeglasses    Wears partial dentures    top     Past Surgical History:  Procedure Laterality Date   COLONOSCOPY     HERNIA REPAIR     inguinal 04/14/17   INGUINAL HERNIA REPAIR Right 04/14/2017   Procedure: RIGHT INGUINAL HERNIA REPAIR WITH MESH;  Surgeon: Armandina Gemma, MD;  [facility]: [facility];  Service: General;  Laterality: Right;   INSERTION OF MESH Right 04/14/2017   Procedure: INSERTION OF MESH;  Surgeon: Armandina Gemma, MD;  [facility];  Service: General;  Laterality: Right;   KNEE CLOSED REDUCTION Right 03/26/2014   Procedure: CLOSED MANIPULATION KNEE;  Surgeon: [name], MD;  Location: [place];  Service: Orthopedics;  Laterality: Right;   RADIOACTIVE SEED IMPLANT N/A 06/15/2019   Procedure: RADIOACTIVE SEED IMPLANT/BRACHYTHERAPY IMPLANT;  Surgeon: [name], MD;  Location: [place];  Service: Urology;  Laterality: N/A;   SPACE OAR INSTILLATION N/A 06/15/2019   Procedure: SPACE OAR INSTILLATION;  Surgeon: [name], MD;   Location: [facility];  Service: Urology;  Laterality: N/A;   TOTAL KNEE ARTHROPLASTY  2011   right, Dr. [name] per pt   TOTAL KNEE ARTHROPLASTY Left 03/26/2014   Procedure: TOTAL KNEE ARTHROPLASTY;  Surgeon: [name], MD;  Location: [place];  Service: Orthopedics;  Laterality: Left;    Family History  Problem Relation Age of Onset   Brain cancer Father    Renal Disease Brother        ESRD, unknown cause   Stomach cancer Neg Hx    Esophageal cancer Neg Hx    Rectal cancer Neg Hx    Breast cancer Neg Hx    Colon cancer Neg Hx    Pancreatic cancer Neg Hx     Social History   Socioeconomic History   Marital status: Married    Spouse name: [place]   Number of children: 5   Years of education: 9th   Highest education level: Not on file  Occupational History   Occupation: retired    Comment: Architect  Tobacco Use   Smoking status: Never   Smokeless tobacco: Never  Vaping Use   Vaping Use: Never used  Substance and Sexual Activity   Alcohol use: No    Alcohol/week: 0.0 standard drinks   Drug  use: No   Sexual activity: Not Currently  Other Topics Concern   Not on file  Social History Narrative   Married, together 43 years in 2016. 1 son, 2 daughters. Can't count # grandkids at least 10, 11. 1 great-granddaughter.   Resides in [place], on [place].   Grandson and daughter live with them.   Retired from Architect work.   Hobbies: time with grand kids-play basketball.   Social Determinants of Health   Financial Resource Strain: Not on file  Food Insecurity: Not on file  Transportation Needs: Not on file  Physical Activity: Not on file  Stress: Not on file  Social Connections: Not on file  Intimate Partner Violence: Not on file    Outpatient Medications Prior to Visit  Medication Sig Dispense Refill   alendronate (FOSAMAX) 70 MG tablet Take 1 tablet (70 mg total) by mouth every 7 (seven) days. Take with a full glass of water on an  empty stomach. 12 tablet 4   amLODipine (NORVASC) 10 MG tablet TAKE 1 TABLET BY MOUTH  DAILY 90 tablet 3   atorvastatin (LIPITOR) 40 MG tablet TAKE 1 TABLET BY MOUTH  DAILY 90 tablet 3   meloxicam (MOBIC) 15 MG tablet TAKE 1 TABLET(15 MG) BY MOUTH DAILY 90 tablet 1   tamsulosin (FLOMAX) 0.4 MG CAPS capsule Take 1 capsule (0.4 mg total) by mouth in the morning and at bedtime. 180 capsule 1   traMADol (ULTRAM) 50 MG tablet Take 50 mg by mouth 2 (two) times daily as needed.     No facility-administered medications prior to visit.    Allergies  Allergen Reactions   Telmisartan Other (See Comments)    Reaction:  Headache     ROS Review of Systems  Constitutional:  Negative for chills, diaphoresis, fatigue, fever and unexpected weight change.  HENT:  Negative for ear discharge, ear pain and hearing loss.   Respiratory: Negative.    Cardiovascular: Negative.   Gastrointestinal: Negative.      Objective:    Physical Exam Vitals and nursing note reviewed.  Constitutional:      General: He is not in acute distress.    Appearance: Normal appearance. He is not ill-appearing, toxic-appearing or diaphoretic.  HENT:     Head: Normocephalic and atraumatic.     Right Ear: There is impacted cerumen.     Left Ear: There is impacted cerumen.     Mouth/Throat:     Mouth: Mucous membranes are moist.     Pharynx: Oropharynx is clear. No oropharyngeal exudate or posterior oropharyngeal erythema.  Eyes:     General:        Right eye: No discharge.        Left eye: No discharge.     Extraocular Movements: Extraocular movements intact.     Conjunctiva/sclera: Conjunctivae normal.  Cardiovascular:     Rate and Rhythm: Normal rate and regular rhythm.  Pulmonary:     Effort: Pulmonary effort is normal.     Breath sounds: Normal breath sounds.  Skin:    General: Skin is warm and dry.  Neurological:     General: No focal deficit present.     Mental Status: He is alert and oriented to person,  place, and time.  Psychiatric:        Mood and Affect: Mood normal.        Behavior: Behavior normal.   Subjective:    Tanner Griffin is a 80 y.o. male whom I  am asked to see for evaluation of diminished hearing in both ears for the past 6 months. There is a prior history of cerumen impaction. The patient has been using ear drops to loosen wax immediately prior to this visit. The patient denies ear pain.  The patient's history has been marked as reviewed and updated as appropriate.  Review of Systems Pertinent items are noted in HPI.    Objective:    Auditory canal(s) of both ears are completely obstructed with cerumen.   Cerumen was removed using gentle irrigation. Tympanic membranes are intact following the procedure.  Auditory canals are partially blocked. .    Assessment:    Cerumen Impaction without otitis externa.    Plan:    1. Care instructions given. 2. Home treatment: none. 3. Follow-up as needed.   BP (!) 150/82   Pulse 60   Temp 97.6 F (36.4 C) (Temporal)   Ht 5\' 8"  (1.727 m)   Wt 160 lb 12.8 oz (72.9 kg)   SpO2 98%   BMI 24.45 kg/m  Wt Readings from Last 3 Encounters:  11/02/21 160 lb 12.8 oz (72.9 kg)  10/28/21 158 lb 2 oz (71.7 kg)  05/12/21 154 lb 9.6 oz (70.1 kg)     Health Maintenance Due  Topic Date Due   Hepatitis C Screening  Never done   FOOT EXAM  10/16/2016   OPHTHALMOLOGY EXAM  11/18/2016   HEMOGLOBIN A1C  09/15/2018   URINE MICROALBUMIN  03/16/2019   TETANUS/TDAP  07/04/2019   COVID-19 Vaccine (4 - Booster for [place] series) 01/07/2021    There are no preventive care reminders to display for this patient.  Lab Results  Component Value Date   TSH 1.03 03/15/2018   Lab Results  Component Value Date   WBC 4.5 04/23/2020   HGB 11.9 (L) 04/23/2020   HCT 36.3 (L) 04/23/2020   MCV 100.0 04/23/2020   PLT 253 04/23/2020   Lab Results  Component Value Date   NA 142 04/23/2020   K 4.1 04/23/2020   CO2 24 04/23/2020   GLUCOSE  103 (H) 04/23/2020   BUN 10 04/23/2020   CREATININE 0.94 04/23/2020   BILITOT 1.0 03/31/2020   ALKPHOS 149 (H) 03/31/2020   AST 25 03/31/2020   ALT 21 03/31/2020   PROT 6.8 03/31/2020   ALBUMIN 4.2 03/31/2020   CALCIUM 9.4 04/23/2020   ANIONGAP 9 04/23/2020   GFR 113.42 03/31/2020   Lab Results  Component Value Date   CHOL 139 03/15/2018   Lab Results  Component Value Date   HDL 46.30 03/15/2018   Lab Results  Component Value Date   LDLCALC 77 03/15/2018   Lab Results  Component Value Date   TRIG 77.0 03/15/2018   Lab Results  Component Value Date   CHOLHDL 3 03/15/2018   Lab Results  Component Value Date   HGBA1C 5.7 03/15/2018      Assessment & Plan:   Problem List Items Addressed This Visit       Nervous and Auditory   Bilateral impacted cerumen - Primary   Relevant Orders   Ambulatory referral to ENT    No orders of the defined types were placed in this encounter.   Follow-up: Return if symptoms worsen or fail to improve.   After 2 attempts was unable to completely clear his ear canals.  Will repeat for to ENT for complete removal of cerumen.  Advised him to continue Debrox drops.  Follow-up for physical as discussed. Gwyndolyn Saxon  Krystal Clark, MD

## 2021-11-05 DIAGNOSIS — R338 Other retention of urine: Secondary | ICD-10-CM | POA: Diagnosis not present

## 2021-11-05 DIAGNOSIS — N138 Other obstructive and reflux uropathy: Secondary | ICD-10-CM | POA: Diagnosis not present

## 2021-11-05 DIAGNOSIS — Z87440 Personal history of urinary (tract) infections: Secondary | ICD-10-CM | POA: Diagnosis not present

## 2021-11-05 DIAGNOSIS — Z466 Encounter for fitting and adjustment of urinary device: Secondary | ICD-10-CM | POA: Diagnosis not present

## 2021-11-05 DIAGNOSIS — N3281 Overactive bladder: Secondary | ICD-10-CM | POA: Diagnosis not present

## 2021-11-05 DIAGNOSIS — I10 Essential (primary) hypertension: Secondary | ICD-10-CM | POA: Diagnosis not present

## 2021-11-05 DIAGNOSIS — G8929 Other chronic pain: Secondary | ICD-10-CM | POA: Diagnosis not present

## 2021-11-05 DIAGNOSIS — E7800 Pure hypercholesterolemia, unspecified: Secondary | ICD-10-CM | POA: Diagnosis not present

## 2021-11-05 DIAGNOSIS — R3914 Feeling of incomplete bladder emptying: Secondary | ICD-10-CM | POA: Diagnosis not present

## 2021-11-05 DIAGNOSIS — R3912 Poor urinary stream: Secondary | ICD-10-CM | POA: Diagnosis not present

## 2021-11-10 ENCOUNTER — Telehealth: Payer: Self-pay | Admitting: Family Medicine

## 2021-11-10 NOTE — Telephone Encounter (Signed)
I attempted to leave message for patient to call back and schedule Medicare Annual Wellness Visit (AWV) in office. No voice mail.  If not able to come in office, please offer to do virtually or by telephone.  Left office number and my jabber [phone].  Last AWV:10/16/2020  Please schedule at anytime with Nurse Health Advisor.

## 2021-11-20 ENCOUNTER — Other Ambulatory Visit: Payer: Self-pay | Admitting: Family

## 2021-11-20 DIAGNOSIS — E785 Hyperlipidemia, unspecified: Secondary | ICD-10-CM

## 2021-11-20 DIAGNOSIS — I152 Hypertension secondary to endocrine disorders: Secondary | ICD-10-CM

## 2021-11-23 ENCOUNTER — Telehealth: Payer: Self-pay | Admitting: Family Medicine

## 2021-11-23 NOTE — Telephone Encounter (Signed)
Called patients wife, [name], after not being able to call on his number.  Gave them the # to Dr Redmond Baseman [phone]. No further questions.  Dm/cma

## 2021-11-24 ENCOUNTER — Ambulatory Visit: Payer: Medicare Other | Admitting: Family Medicine

## 2021-11-25 ENCOUNTER — Telehealth: Payer: Self-pay | Admitting: Family Medicine

## 2021-11-26 ENCOUNTER — Other Ambulatory Visit: Payer: Self-pay

## 2021-11-26 ENCOUNTER — Encounter: Payer: Self-pay | Admitting: Family Medicine

## 2021-11-26 ENCOUNTER — Ambulatory Visit (INDEPENDENT_AMBULATORY_CARE_PROVIDER_SITE_OTHER): Payer: Medicare Other | Admitting: Family Medicine

## 2021-11-26 VITALS — BP 140/82 | HR 70 | Temp 97.6°F | Ht 68.0 in | Wt 160.2 lb

## 2021-11-26 DIAGNOSIS — I10 Essential (primary) hypertension: Secondary | ICD-10-CM

## 2021-11-26 DIAGNOSIS — K5909 Other constipation: Secondary | ICD-10-CM

## 2021-11-26 DIAGNOSIS — E119 Type 2 diabetes mellitus without complications: Secondary | ICD-10-CM

## 2021-11-26 DIAGNOSIS — E785 Hyperlipidemia, unspecified: Secondary | ICD-10-CM | POA: Diagnosis not present

## 2021-11-26 LAB — URINALYSIS, ROUTINE W REFLEX MICROSCOPIC
Bilirubin Urine: NEGATIVE
Ketones, ur: NEGATIVE
Nitrite: POSITIVE — AB
Specific Gravity, Urine: 1.025 (ref 1.000–1.030)
Total Protein, Urine: 100 — AB
Urine Glucose: NEGATIVE
Urobilinogen, UA: 1 (ref 0.0–1.0)
pH: 6 (ref 5.0–8.0)

## 2021-11-26 LAB — BASIC METABOLIC PANEL
BUN: 21 mg/dL (ref 6–23)
CO2: 31 mEq/L (ref 19–32)
Calcium: 9.4 mg/dL (ref 8.4–10.5)
Chloride: 109 mEq/L (ref 96–112)
Creatinine, Ser: 0.97 mg/dL (ref 0.40–1.50)
GFR: 74.7 mL/min (ref >60.00)
Glucose, Bld: 90 mg/dL (ref 70–99)
Potassium: 4.2 mEq/L (ref 3.5–5.1)
Sodium: 143 mEq/L (ref 135–145)

## 2021-11-26 LAB — LIPID PANEL
Cholesterol: 141 mg/dL (ref 0–200)
HDL: 51 mg/dL (ref >39.00)
LDL Cholesterol: 78 mg/dL (ref 0–99)
NonHDL: 89.59
Total CHOL/HDL Ratio: 3
Triglycerides: 56 mg/dL (ref 0.0–149.0)
VLDL: 11.2 mg/dL (ref 0.0–40.0)

## 2021-11-26 LAB — MICROALBUMIN / CREATININE URINE RATIO
Creatinine,U: 149.2 mg/dL
Microalb Creat Ratio: 29.3 mg/g (ref 0.0–30.0)
Microalb, Ur: 43.7 mg/dL — ABNORMAL HIGH (ref 0.0–1.9)

## 2021-11-26 LAB — HEMOGLOBIN A1C: Hgb A1c MFr Bld: 5.1 % (ref 4.6–6.5)

## 2021-11-26 NOTE — Progress Notes (Signed)
[name] [address] [place] [address] Dept: [phone] Dept Fax: [phone]  Office Visit  Subjective:    Patient ID: Tanner Griffin, male    DOB: 1941/09/23, 80 y.o..   MRN: [id number]  Chief Complaint  Patient presents with   Acute Visit    C/o having constipation x weeks.  He has taken Maalox with little relief.       History of Present Illness:  Patient is in today for with a complaint of ongoing, chronic constipation. Tanner Griffin notes that his bowel movements are often large and very hard. He has them about 2 times a week. He notes he has to strain quite a bit with each one. He has been trying Miralax 2 tbl. 2-3 times a week, but finds this does not help.  Tanner Griffin has multiple chronic health conditions, but has not been receiving routine care for this. He has a history of hypertension, and is managed on amlodipine. He has a history of hyperlipidemia and is managed on atorvastatin. He has diet-controlled Type 2 diabetes. He has a history of Paget's disease of the bone and is managed on alendronate. He has a history of BPH and is managed on tamsulosin.  Past Medical History: Patient Active Problem List   Diagnosis Date Noted   Bilateral impacted cerumen 10/28/2021   Degenerative arthritis of right elbow 04/06/2021   Distal radius fracture, right 05/30/2020   Macrocytic anemia 03/31/2020   Paget's disease of the bone 12/10/2019   Acute left-sided low back pain without sciatica 11/29/2019   Malignant neoplasm of prostate ([place]) 02/12/2019   Lumbar spinal stenosis 09/25/2018   Right lumbar radiculopathy 09/05/2018   Polyneuropathy 09/05/2018   Right leg weakness 07/26/2018   Elevated PSA 03/16/2018   Right inguinal hernia 03/14/2017   Degenerative disc disease, lumbar 01/11/2017   Polyarthralgia 04/26/2016   Pain due to total knee replacement (HCC) 04/26/2016   Chronic constipation 01/20/2015   Focal  myoclonus 06/05/2014   Osteoarthritis of knee s/p bilateral knee replacements 03/26/2014   Spongiotic dermatitis 03/19/2013   Personal history of colonic adenoma 10/30/2012   BPH with obstruction/lower urinary tract symptoms 06/05/2012   Right knee pain 04/17/2011   Type II diabetes mellitus, well controlled ([place]) 02/12/2008   Hyperlipidemia 06/19/2007   Essential hypertension 06/19/2007   Past Surgical History:  Procedure Laterality Date   COLONOSCOPY     HERNIA REPAIR     inguinal 04/14/17   INGUINAL HERNIA REPAIR Right 04/14/2017   Procedure: RIGHT INGUINAL HERNIA REPAIR WITH MESH;  Surgeon: Armandina Gemma, MD;  Location: [facility];  Service: General;  Laterality: Right;   INSERTION OF MESH Right 04/14/2017   Procedure: INSERTION OF MESH;  Surgeon: Armandina Gemma, MD;  Location: [facility];  Service: General;  Laterality: Right;   KNEE CLOSED REDUCTION Right 03/26/2014   Procedure: CLOSED MANIPULATION KNEE;  Surgeon: [name], MD;  Location: [facility];  Service: Orthopedics;  Laterality: Right;   RADIOACTIVE SEED IMPLANT N/A 06/15/2019   Procedure: RADIOACTIVE SEED IMPLANT/BRACHYTHERAPY IMPLANT;  Surgeon: [name], MD;  Location: [facility];  Service: Urology;  Laterality: N/A;   SPACE OAR INSTILLATION N/A 06/15/2019   Procedure: SPACE OAR INSTILLATION;  Surgeon: [name], MD;  Location: [facility];  Service: Urology;  Laterality: N/A;   TOTAL KNEE ARTHROPLASTY  2011   right, Dr. [name] per pt   TOTAL KNEE ARTHROPLASTY Left 03/26/2014   Procedure: TOTAL  KNEE ARTHROPLASTY;  Surgeon: [name], MD;  Location: [facility];  Service: Orthopedics;  Laterality: Left;   Family History  Problem Relation Age of Onset   Brain cancer Father    Renal Disease Brother        ESRD, unknown cause   Stomach cancer Neg Hx    Esophageal cancer Neg Hx    Rectal cancer Neg Hx    Breast cancer Neg Hx    Colon cancer Neg Hx    Pancreatic cancer Neg Hx    Outpatient  Medications Prior to Visit  Medication Sig Dispense Refill   alendronate (FOSAMAX) 70 MG tablet Take 1 tablet (70 mg total) by mouth every 7 (seven) days. Take with a full glass of water on an empty stomach. 12 tablet 4   amLODipine (NORVASC) 10 MG tablet TAKE 1 TABLET BY MOUTH  DAILY 90 tablet 3   atorvastatin (LIPITOR) 40 MG tablet TAKE 1 TABLET BY MOUTH  DAILY 90 tablet 3   meloxicam (MOBIC) 15 MG tablet TAKE 1 TABLET(15 MG) BY MOUTH DAILY 90 tablet 1   tamsulosin (FLOMAX) 0.4 MG CAPS capsule Take 1 capsule (0.4 mg total) by mouth in the morning and at bedtime. 180 capsule 1   traMADol (ULTRAM) 50 MG tablet Take 50 mg by mouth 2 (two) times daily as needed.     No facility-administered medications prior to visit.   Allergies  Allergen Reactions   Telmisartan Other (See Comments)    Reaction:  Headache     Objective:   Today's Vitals   11/26/21 0838  BP: 140/82  Pulse: 70  Temp: 97.6 F (36.4 C)  TempSrc: Temporal  SpO2: 98%  Weight: 160 lb 3.2 oz (72.7 kg)  Height: 5\' 8"  (1.727 m)   Body mass index is 24.36 kg/m.   General: Well developed, well nourished. No acute distress. Abdomen: Soft, non-tender. Bowel sounds positive, normal pitch and frequency. No hepatosplenomegaly. No   rebound or guarding. Psych: Alert and oriented. Normal mood and affect.  Health Maintenance Due  Topic Date Due   Hepatitis C Screening  Never done   FOOT EXAM  10/16/2016   OPHTHALMOLOGY EXAM  11/18/2016   HEMOGLOBIN A1C  09/15/2018   URINE MICROALBUMIN  03/16/2019   TETANUS/TDAP  07/04/2019   COVID-19 Vaccine (4 - Booster for Pfizer series) 01/07/2021     Assessment & Plan:   1. Chronic constipation I recommended that Tanner Griffin be using a daily fiber supplement for his baseline management of constipation. He should also make sure he is hydrating well. I recommend that he [place] Miralax 2-4 tbl. in 8 oz. of liquid every 2-3 days as needed for constipation.  2. Type II diabetes mellitus, well  controlled [facility]) Tanner Griffin is past due for assessment of his diabetes. I will obtain annual labs.  - Microalbumin / creatinine urine ratio - Basic metabolic panel - Hemoglobin A1c - Urinalysis, Routine w reflex microscopic  3. Essential hypertension Tanner Griffin's blood pressure is marginal. He will continue amlodipine. Would consider the addiiton of an ACE-I or a diuretic if pressure remains high at next visit.  4. Hyperlipidemia, unspecified hyperlipidemia type Due for repeat lipids. Continue atorvastatin.  - Lipid panel  Haydee Salter, MD

## 2021-11-26 NOTE — Telephone Encounter (Signed)
error 

## 2021-11-26 NOTE — Patient Instructions (Signed)

## 2021-11-30 NOTE — Progress Notes (Signed)
Called and informed patient of results and provider instructions. Patient voiced understanding. Pt denied experiencing any UTI symptoms. Explained to pt if anything changes please reach out to [place].

## 2021-12-01 DIAGNOSIS — H6122 Impacted cerumen, left ear: Secondary | ICD-10-CM | POA: Insufficient documentation

## 2021-12-02 ENCOUNTER — Ambulatory Visit: Payer: Medicare Other

## 2021-12-02 ENCOUNTER — Telehealth: Payer: Self-pay

## 2021-12-02 NOTE — Telephone Encounter (Signed)
Called patient x 3 automatically rolls over to voice mail with a message that voicemail is not set up. Patient may call reschedule for next available appointment.   L.Sariah Henkin,LPN

## 2021-12-07 ENCOUNTER — Telehealth: Payer: Self-pay | Admitting: Family Medicine

## 2021-12-07 DIAGNOSIS — M174 Other bilateral secondary osteoarthritis of knee: Secondary | ICD-10-CM

## 2021-12-07 NOTE — Telephone Encounter (Signed)
Spoke to [place], patients wife, she states that they would like to have home PT due to bilateral knee issues to help him get around better.   Please review and advise.  Thanks.  Dm/cma

## 2021-12-07 NOTE — Telephone Encounter (Signed)
No answer and VM is fulll, will try later. Dm/cma

## 2021-12-08 NOTE — Telephone Encounter (Signed)
Patients wife,notified VIA.  No questions. Dm/cma

## 2021-12-16 ENCOUNTER — Ambulatory Visit: Payer: Medicare Other | Admitting: Physical Therapy

## 2021-12-16 NOTE — Telephone Encounter (Signed)
Pt wife called back and I relayed these msgs about home PT. She wanted to check in and ask again. I told her Langley Gauss would give her a call when she can.

## 2021-12-28 NOTE — Telephone Encounter (Signed)
Spoke to patient's wife, Tanner Griffin. I did advise her to she could call their insurance to see how to get the PT covered for home.  He is not home bound and drives himself to his own appointments  (per Dr Gena Fray).  No further questions.  Dm/cma

## 2021-12-28 NOTE — Telephone Encounter (Signed)
Pts wife called about Home PT again. They cancelled appt with [facility]. She is saying pt falls 4-5x a day and she has to pick him up. She said he needs Home PT. Advised of below and she is requesting further discussion.   Please call [phone]

## 2021-12-29 ENCOUNTER — Telehealth: Payer: Self-pay

## 2021-12-29 NOTE — Progress Notes (Signed)
° ° °  Chronic Care Management Pharmacy Assistant   Name: STEED KANAAN  MRN: [id number] DOB: [birth year]  Reason for Encounter: Medication Review/General Adherence Call.    Recent office visits:  11/26/2021 Dr. Gena Fray MD (PCP) start Miralax 2-4 tbl. in 8 oz. of liquid every 2-3 days as needed for constipation. 11/02/2021 Dr.Kremer MD (PCP Office) No Medication Changes noted, Ambulatory referral to Physical Therapy 05/12/2021 Dr. Gena Fray MD (PCP) No Medication Changes noted, Ambulatory referral to Physical Therapy,Ambulatory referral to Orthopedic Surgery  Recent consult visits:  12/01/2021 Pietro Cassis PA-C (Otolaryngology) No Medication Changes noted 10/28/2021 Karl Ito NP (Pain Medicine) No Medication Changes noted 04/13/2021 Jacalyn Lefevre MD (Urology) Unable to see note  Hospital visits:  None in previous 6 months  Medications: Outpatient Encounter Medications as of 12/29/2021  Medication Sig   alendronate (FOSAMAX) 70 MG tablet Take 1 tablet (70 mg total) by mouth every 7 (seven) days. Take with a full glass of water on an empty stomach.   amLODipine (NORVASC) 10 MG tablet TAKE 1 TABLET BY MOUTH  DAILY   atorvastatin (LIPITOR) 40 MG tablet TAKE 1 TABLET BY MOUTH  DAILY   meloxicam (MOBIC) 15 MG tablet TAKE 1 TABLET(15 MG) BY MOUTH DAILY   tamsulosin (FLOMAX) 0.4 MG CAPS capsule Take 1 capsule (0.4 mg total) by mouth in the morning and at bedtime.   No facility-administered encounter medications on file as of 12/29/2021.    Care Gaps: Hepatits C Screening Foot Exam Ophthalmology Exam Tetanus Vaccine COVID-19 Vaccine Shingrix Vaccine HTN: 140/82 on 11/26/2021.  Star Rating Drugs: Atorvastatin 40 mg last filled 12/24/2021 90 day supply at [facility].  Medication Fill Gaps: None ID  Called patient and discussed medication adherence  with patient, No issues at this time with current medication.   Patient Denies ED visit since his last CPP follow up.  Patient Denies   any side effects with his medication. Patient Denies  any problems with hiscurrent pharmacy  Telephone follow up appointment with Care management team member scheduled for : 04/15/2022 at 3:00 pm.   Bessie [place] Pharmacist Assistant [phone]

## 2021-12-31 DIAGNOSIS — Z87440 Personal history of urinary (tract) infections: Secondary | ICD-10-CM | POA: Diagnosis not present

## 2021-12-31 DIAGNOSIS — N138 Other obstructive and reflux uropathy: Secondary | ICD-10-CM | POA: Diagnosis not present

## 2021-12-31 DIAGNOSIS — N3281 Overactive bladder: Secondary | ICD-10-CM | POA: Diagnosis not present

## 2021-12-31 DIAGNOSIS — R3912 Poor urinary stream: Secondary | ICD-10-CM | POA: Diagnosis not present

## 2021-12-31 DIAGNOSIS — R3914 Feeling of incomplete bladder emptying: Secondary | ICD-10-CM | POA: Diagnosis not present

## 2021-12-31 DIAGNOSIS — G8929 Other chronic pain: Secondary | ICD-10-CM | POA: Diagnosis not present

## 2021-12-31 DIAGNOSIS — E7800 Pure hypercholesterolemia, unspecified: Secondary | ICD-10-CM | POA: Diagnosis not present

## 2021-12-31 DIAGNOSIS — Z466 Encounter for fitting and adjustment of urinary device: Secondary | ICD-10-CM | POA: Diagnosis not present

## 2021-12-31 DIAGNOSIS — I10 Essential (primary) hypertension: Secondary | ICD-10-CM | POA: Diagnosis not present

## 2021-12-31 DIAGNOSIS — R338 Other retention of urine: Secondary | ICD-10-CM | POA: Diagnosis not present

## 2022-01-30 DIAGNOSIS — Z87440 Personal history of urinary (tract) infections: Secondary | ICD-10-CM | POA: Diagnosis not present

## 2022-01-30 DIAGNOSIS — R3912 Poor urinary stream: Secondary | ICD-10-CM | POA: Diagnosis not present

## 2022-01-30 DIAGNOSIS — Z466 Encounter for fitting and adjustment of urinary device: Secondary | ICD-10-CM | POA: Diagnosis not present

## 2022-01-30 DIAGNOSIS — R3914 Feeling of incomplete bladder emptying: Secondary | ICD-10-CM | POA: Diagnosis not present

## 2022-01-30 DIAGNOSIS — G8929 Other chronic pain: Secondary | ICD-10-CM | POA: Diagnosis not present

## 2022-01-30 DIAGNOSIS — N3281 Overactive bladder: Secondary | ICD-10-CM | POA: Diagnosis not present

## 2022-01-30 DIAGNOSIS — R338 Other retention of urine: Secondary | ICD-10-CM | POA: Diagnosis not present

## 2022-01-30 DIAGNOSIS — E7800 Pure hypercholesterolemia, unspecified: Secondary | ICD-10-CM | POA: Diagnosis not present

## 2022-01-30 DIAGNOSIS — I10 Essential (primary) hypertension: Secondary | ICD-10-CM | POA: Diagnosis not present

## 2022-01-30 DIAGNOSIS — N138 Other obstructive and reflux uropathy: Secondary | ICD-10-CM | POA: Diagnosis not present

## 2022-02-05 ENCOUNTER — Telehealth: Payer: Self-pay | Admitting: Family Medicine

## 2022-02-05 NOTE — Telephone Encounter (Signed)
Pts wife calling ... she needs to order him more bags for his urin... and forgot where to order from.

## 2022-02-05 NOTE — Telephone Encounter (Signed)
Spoke to patient's wife and she states that she will figure it out and not to worry about it.  Dm/cma

## 2022-02-18 ENCOUNTER — Other Ambulatory Visit: Payer: Self-pay

## 2022-02-18 ENCOUNTER — Encounter: Payer: Self-pay | Admitting: Family Medicine

## 2022-02-18 ENCOUNTER — Ambulatory Visit (INDEPENDENT_AMBULATORY_CARE_PROVIDER_SITE_OTHER): Payer: Medicare Other | Admitting: Family Medicine

## 2022-02-18 VITALS — BP 118/68 | HR 72 | Temp 97.3°F | Ht 68.0 in | Wt 160.4 lb

## 2022-02-18 DIAGNOSIS — R143 Flatulence: Secondary | ICD-10-CM | POA: Diagnosis not present

## 2022-02-18 DIAGNOSIS — N401 Enlarged prostate with lower urinary tract symptoms: Secondary | ICD-10-CM | POA: Diagnosis not present

## 2022-02-18 DIAGNOSIS — N138 Other obstructive and reflux uropathy: Secondary | ICD-10-CM | POA: Diagnosis not present

## 2022-02-18 DIAGNOSIS — M2550 Pain in unspecified joint: Secondary | ICD-10-CM

## 2022-02-18 DIAGNOSIS — D539 Nutritional anemia, unspecified: Secondary | ICD-10-CM

## 2022-02-18 LAB — B12 AND FOLATE PANEL
Folate: 16.5 ng/mL (ref >5.9)
Vitamin B-12: 1087 pg/mL — ABNORMAL HIGH (ref 211–911)

## 2022-02-18 MED ORDER — SIMETHICONE 80 MG PO CHEW: 80.0000 mg | CHEWABLE_TABLET | Freq: Four times a day (QID) | ORAL | 0 refills | Status: DC | PRN

## 2022-02-18 MED ORDER — TRAMADOL HCL 50 MG PO TABS: 50.0000 mg | ORAL_TABLET | Freq: Two times a day (BID) | ORAL | 1 refills | Status: AC | PRN

## 2022-02-18 NOTE — Progress Notes (Addendum)
Established Patient Office Visit  Subjective:  Patient ID: Tanner Griffin, male    DOB: 04/20/1941  Age: 81 y.o. MRN: [id number]  CC:  Chief Complaint  Patient presents with   Gas    Concerns about frequent gassiness, no abdominal pains.     HPI Tanner Griffin presents for follow-up of constipation that has been relieved with as needed MiraLAX use.  He reports ongoing flatulence.  Last needed to MiraLAX a few weeks ago.  He denies any indigestion reflux nausea or vomiting.  Uses a walker for ambulation mostly secondary to pain in his knees hips.  He is having a lot of pain in his hands as well.  Currently taking Mobic with some relief.  Voltaren gel did not seem to help.  He is status post bilateral knee replacement.  Past Medical History:  Diagnosis Date   Arthritis    Arthrofibrosis of total knee arthroplasty, right 03/26/2014   Constipation    takes Miralax daily as needed   Headache    occasional   HYPERLIPIDEMIA    takes Lipitor daily   HYPERTENSION    takes Amlodipine and Labetalol daily   Joint pain    Joint swelling    Nocturia    Osteoarthritis of left knee 03/26/2014   Pneumonia    hx of-as a child   Prostate cancer ([place])    Urinary frequency    takes Flomax daily   Wears eyeglasses    Wears partial dentures    top     Past Surgical History:  Procedure Laterality Date   COLONOSCOPY     HERNIA REPAIR     inguinal 04/14/17   INGUINAL HERNIA REPAIR Right 04/14/2017   Procedure: RIGHT INGUINAL HERNIA REPAIR WITH MESH;  Surgeon: Armandina Gemma, MD;  [facility];  Service: General;  Laterality: Right;   INSERTION OF MESH Right 04/14/2017   Procedure: INSERTION OF MESH;  Surgeon: Armandina Gemma, MD;  [facility];  Service: General;  Laterality: Right;   KNEE CLOSED REDUCTION Right 03/26/2014   Procedure: CLOSED MANIPULATION KNEE;  Surgeon: [name], MD;  Location: [place];  Service: Orthopedics;  Laterality: Right;   RADIOACTIVE SEED IMPLANT N/A 06/15/2019    Procedure: RADIOACTIVE SEED IMPLANT/BRACHYTHERAPY IMPLANT;  Surgeon: [name], MD;  Location: [facility];  Service: Urology;  Laterality: N/A;   SPACE OAR INSTILLATION N/A 06/15/2019   Procedure: SPACE OAR INSTILLATION;  Surgeon: [name], MD;  Location[facility];  Service: Urology;  Laterality: N/A;   TOTAL KNEE ARTHROPLASTY  2011   right, Dr. [name] per pt   TOTAL KNEE ARTHROPLASTY Left 03/26/2014   Procedure: TOTAL KNEE ARTHROPLASTY;  Surgeon: [name], MD;  Location: [facility];  Service: Orthopedics;  Laterality: Left;    Family History  Problem Relation Age of Onset   Brain cancer Father    Renal Disease Brother        ESRD, unknown cause   Stomach cancer Neg Hx    Esophageal cancer Neg Hx    Rectal cancer Neg Hx    Breast cancer Neg Hx    Colon cancer Neg Hx    Pancreatic cancer Neg Hx     Social History   Socioeconomic History   Marital status: Married    Spouse name: [place]   Number of children: 5   Years of education: 9th   Highest education level: Not on file  Occupational History   Occupation:  retired    Comment: Architect  Tobacco Use   Smoking status: Never   Smokeless tobacco: Never  Vaping Use   Vaping Use: Never used  Substance and Sexual Activity   Alcohol use: No    Alcohol/week: 0.0 standard drinks   Drug use: No   Sexual activity: Not Currently  Other Topics Concern   Not on file  Social History Narrative   Married, together 54 years in 2016. 1 son, 2 daughters. Can't count # grandkids at least 10, 11. 1 great-granddaughter.   Resides in [place], on [address].   Grandson and daughter live with them.   Retired from Architect work.   Hobbies: time with grand kids-play basketball.   Social Determinants of Health   Financial Resource Strain: Not on file  Food Insecurity: Not on file  Transportation Needs: Not on file  Physical Activity: Not on file  Stress: Not on file  Social  Connections: Not on file  Intimate Partner Violence: Not on file    Outpatient Medications Prior to Visit  Medication Sig Dispense Refill   alendronate (FOSAMAX) 70 MG tablet Take 1 tablet (70 mg total) by mouth every 7 (seven) days. Take with a full glass of water on an empty stomach. 12 tablet 4   amLODipine (NORVASC) 10 MG tablet TAKE 1 TABLET BY MOUTH  DAILY 90 tablet 3   atorvastatin (LIPITOR) 40 MG tablet TAKE 1 TABLET BY MOUTH  DAILY 90 tablet 3   meloxicam (MOBIC) 15 MG tablet TAKE 1 TABLET(15 MG) BY MOUTH DAILY 90 tablet 1   tamsulosin (FLOMAX) 0.4 MG CAPS capsule Take 1 capsule (0.4 mg total) by mouth in the morning and at bedtime. 180 capsule 1   No facility-administered medications prior to visit.    Allergies  Allergen Reactions   Telmisartan Other (See Comments)    Reaction:  Headache     ROS Review of Systems  Constitutional:  Negative for chills, diaphoresis, fatigue, fever and unexpected weight change.  HENT: Negative.    Eyes:  Negative for photophobia and visual disturbance.  Respiratory: Negative.    Cardiovascular: Negative.   Gastrointestinal: Negative.   Endocrine: Negative for polyphagia and polyuria.  Genitourinary: Negative.   Musculoskeletal:  Positive for arthralgias and gait problem.  Neurological:  Negative for speech difficulty, weakness and light-headedness.     Objective:    Physical Exam Vitals and nursing note reviewed.  Constitutional:      General: He is not in acute distress.    Appearance: Normal appearance. He is normal weight. He is not ill-appearing, toxic-appearing or diaphoretic.  HENT:     Head: Normocephalic and atraumatic.     Right Ear: External ear normal.     Left Ear: External ear normal.     Mouth/Throat:     Mouth: Mucous membranes are moist.     Pharynx: Oropharynx is clear. No oropharyngeal exudate or posterior oropharyngeal erythema.  Eyes:     General: No scleral icterus.       Right eye: No discharge.         Left eye: No discharge.     Extraocular Movements: Extraocular movements intact.     Conjunctiva/sclera: Conjunctivae normal.     Pupils: Pupils are equal, round, and reactive to light.  Cardiovascular:     Rate and Rhythm: Normal rate and regular rhythm.  Pulmonary:     Effort: Pulmonary effort is normal.     Breath sounds: Normal breath sounds.  Abdominal:  General: Bowel sounds are normal. There is no distension.     Palpations: Abdomen is soft.     Tenderness: There is no abdominal tenderness.     Hernia: A hernia is present. Hernia is present in the ventral area.  Musculoskeletal:     Cervical back: No rigidity or tenderness.     Right hip: Decreased range of motion.     Left hip: Decreased range of motion.     Right knee: Swelling present. No deformity, effusion or erythema.     Left knee: Swelling present. No deformity, effusion or erythema.  Lymphadenopathy:     Cervical: No cervical adenopathy.  Skin:    General: Skin is warm and dry.  Neurological:     Mental Status: He is alert and oriented to person, place, and time.  Psychiatric:        Mood and Affect: Mood normal.        Behavior: Behavior normal.    BP 118/68 (BP Location: Right Arm, Patient Position: Sitting, Cuff Size: Normal)   Pulse 72   Temp (!) 97.3 F (36.3 C) (Temporal)   Ht 5\' 8"  (1.727 m)   Wt 160 lb 6.4 oz (72.8 kg)   SpO2 98%   BMI 24.39 kg/m  Wt Readings from Last 3 Encounters:  02/18/22 160 lb 6.4 oz (72.8 kg)  11/26/21 160 lb 3.2 oz (72.7 kg)  11/02/21 160 lb 12.8 oz (72.9 kg)     Health Maintenance Due  Topic Date Due   Hepatitis C Screening  Never done   FOOT EXAM  10/16/2016    There are no preventive care reminders to display for this patient.  Lab Results  Component Value Date   TSH 1.03 03/15/2018   Lab Results  Component Value Date   WBC 4.5 04/23/2020   HGB 11.9 (L) 04/23/2020   HCT 36.3 (L) 04/23/2020   MCV 100.0 04/23/2020   PLT 253 04/23/2020   Lab Results   Component Value Date   NA 143 11/26/2021   K 4.2 11/26/2021   CO2 31 11/26/2021   GLUCOSE 90 11/26/2021   BUN 21 11/26/2021   CREATININE 0.97 11/26/2021   BILITOT 1.0 03/31/2020   ALKPHOS 149 (H) 03/31/2020   AST 25 03/31/2020   ALT 21 03/31/2020   PROT 6.8 03/31/2020   ALBUMIN 4.2 03/31/2020   CALCIUM 9.4 11/26/2021   ANIONGAP 9 04/23/2020   GFR 74.70 11/26/2021   Lab Results  Component Value Date   CHOL 141 11/26/2021   Lab Results  Component Value Date   HDL 51.00 11/26/2021   Lab Results  Component Value Date   LDLCALC 78 11/26/2021   Lab Results  Component Value Date   TRIG 56.0 11/26/2021   Lab Results  Component Value Date   CHOLHDL 3 11/26/2021   Lab Results  Component Value Date   HGBA1C 5.1 11/26/2021      Assessment & Plan:   Problem List Items Addressed This Visit       Genitourinary   BPH with obstruction/lower urinary tract symptoms     Other   Arthralgia   Macrocytic anemia   Relevant Orders   B12 and Folate Panel (Completed)   Flatulence/wind - Primary   Relevant Medications   simethicone (GAS-X) 80 MG chewable tablet    Meds ordered this encounter  Medications   simethicone (GAS-X) 80 MG chewable tablet    Sig: Chew 1 tablet (80 mg total) by mouth every 6 (six)  hours as needed for flatulence.    Dispense:  30 tablet    Refill:  0   traMADol (ULTRAM) 50 MG tablet    Sig: Take 1 tablet (50 mg total) by mouth every 12 (twelve) hours as needed.    Dispense:  30 tablet    Refill:  1    Follow-up: Return in about 3 months (around 05/21/2022).   Continue MiraLAX as needed constipation.  We will try Gas-X for flatulence.  Continue Mobic as needed as needed for more severe pain.  Drowsy precautions given. Libby Maw, MD

## 2022-02-24 ENCOUNTER — Telehealth: Payer: Self-pay | Admitting: Family Medicine

## 2022-02-24 NOTE — Telephone Encounter (Signed)
Pts wife called they have misplaced one of Williams meds and need help to figure it out please call. ?

## 2022-02-25 ENCOUNTER — Ambulatory Visit: Payer: Medicare Other

## 2022-02-25 ENCOUNTER — Telehealth: Payer: Self-pay | Admitting: Family Medicine

## 2022-02-25 NOTE — Telephone Encounter (Signed)
Please call about lost medication from yesterday. ?

## 2022-02-25 NOTE — Telephone Encounter (Signed)
Spoke to patients wife, [name], went over his medications and her has all his bottles that we have listed.  No further questions.  Dm/cma ? ?

## 2022-03-02 NOTE — Telephone Encounter (Signed)
Pts wife called back and mentioned they were pink pills if that helps. I informed her that in the last conversation with Langley Gauss, they went over all of the medications together.  ?

## 2022-03-03 ENCOUNTER — Ambulatory Visit: Payer: Medicare Other

## 2022-03-03 NOTE — Telephone Encounter (Signed)
Advised patients wife that we went over all the meds on last call.  Not sure what the color of the pills are.   She states that she isnt' worried about it anymore.  Dm/cma ? ?

## 2022-03-04 ENCOUNTER — Ambulatory Visit: Payer: Medicare Other

## 2022-03-11 ENCOUNTER — Ambulatory Visit (INDEPENDENT_AMBULATORY_CARE_PROVIDER_SITE_OTHER): Payer: Medicare Other

## 2022-03-11 DIAGNOSIS — Z23 Encounter for immunization: Secondary | ICD-10-CM | POA: Diagnosis not present

## 2022-03-11 NOTE — Progress Notes (Signed)
After obtaining consent, and per orders of Dr. Rudd, injection of Shingles #2 given by Ivie Savitt. Patient instructed to remain in clinic for 20 minutes afterwards, and to report any adverse reaction to me immediately.  

## 2022-03-15 ENCOUNTER — Telehealth: Payer: Self-pay | Admitting: Family Medicine

## 2022-03-15 DIAGNOSIS — N138 Other obstructive and reflux uropathy: Secondary | ICD-10-CM

## 2022-03-15 NOTE — Telephone Encounter (Signed)
Pt requesting a call back from nurse about [place], call back [phone] ?

## 2022-03-15 NOTE — Telephone Encounter (Signed)
Spoke to patients wife, [name], she reports that Tanner Griffin has been coming out to the house once a month to replace his urine cath.  he missed an appointment with them. They have since stopped come out. Can you re-order home health for him?  ?Thanks. Dm/cma ? ?

## 2022-03-16 ENCOUNTER — Telehealth: Payer: Self-pay | Admitting: Family Medicine

## 2022-03-16 NOTE — Telephone Encounter (Signed)
Pts wife called and is requesting help with her husbands catheter bag. Noone has come to help and it is well over due.She stated that the company [employer] that was helping, said they cannot help them with any longer. Due to the wifes mental state I did not get good clarification for their issue. Please advise. ?

## 2022-03-16 NOTE — Telephone Encounter (Signed)
Lft detailed VM that referral was placed and to give it a week. If you dont' hear anythingget in touch with [place]. Dm/cma ? ?

## 2022-03-17 NOTE — Telephone Encounter (Signed)
Unable to leave VM due to mailbox being full.  A referral was placed for Home Health to come out.  Dm/cma ? ?

## 2022-03-22 NOTE — Telephone Encounter (Signed)
Tried to call back but VM is full.  Will try later. Dm/cma ? ?

## 2022-03-22 NOTE — Telephone Encounter (Signed)
Tried to call back but VM is full.  Will try later. Dm/cma ?

## 2022-03-22 NOTE — Telephone Encounter (Signed)
Pts wife requested call back [phone] ?

## 2022-03-23 NOTE — Telephone Encounter (Signed)
Can you help and check to see if this Home health referral is okay and when they will be contacting them? ?Please and thank you.  Dm/cma ? ?

## 2022-03-24 ENCOUNTER — Telehealth: Payer: Self-pay | Admitting: Family Medicine

## 2022-03-24 DIAGNOSIS — I10 Essential (primary) hypertension: Secondary | ICD-10-CM | POA: Diagnosis not present

## 2022-03-24 DIAGNOSIS — E785 Hyperlipidemia, unspecified: Secondary | ICD-10-CM | POA: Diagnosis not present

## 2022-03-24 DIAGNOSIS — R350 Frequency of micturition: Secondary | ICD-10-CM

## 2022-03-24 MED ORDER — TAMSULOSIN HCL 0.4 MG PO CAPS: 0.4000 mg | ORAL_CAPSULE | Freq: Two times a day (BID) | ORAL | 3 refills | Status: DC

## 2022-03-24 NOTE — Telephone Encounter (Signed)
[name] from [facility] needs orders for Catheter care, and a PT evaluation. ?He also needs a refill on his Tamsullfin. ?

## 2022-03-24 NOTE — Telephone Encounter (Signed)
Spoke to patient VIA phone to inform him that [facility] will be calling him.  Dm/cma ? ?

## 2022-03-25 ENCOUNTER — Other Ambulatory Visit: Payer: Self-pay

## 2022-03-25 DIAGNOSIS — G8929 Other chronic pain: Secondary | ICD-10-CM

## 2022-03-25 MED ORDER — MELOXICAM 15 MG PO TABS: ORAL_TABLET | ORAL | 0 refills | Status: DC

## 2022-03-25 NOTE — Telephone Encounter (Signed)
Mal Amabile with [facility]  @ [phone], gave a verbal okay for PT and catheter care.  Dm/cma ? ?

## 2022-03-25 NOTE — Telephone Encounter (Signed)
Received a refill request for  ?Meloxicam 15 mg  ?LR 06/23/21, #90, 1 rf (Dr Cathleen Corti) ?LOV 02/18/22 ?FOV 05/19/22 ? ?Please review and advise.   ?Thanks. Dm/cma ? ?

## 2022-03-29 DIAGNOSIS — E785 Hyperlipidemia, unspecified: Secondary | ICD-10-CM | POA: Diagnosis not present

## 2022-03-29 DIAGNOSIS — I10 Essential (primary) hypertension: Secondary | ICD-10-CM | POA: Diagnosis not present

## 2022-03-30 ENCOUNTER — Ambulatory Visit: Payer: Medicare Other | Admitting: Family Medicine

## 2022-04-01 DIAGNOSIS — E785 Hyperlipidemia, unspecified: Secondary | ICD-10-CM | POA: Diagnosis not present

## 2022-04-01 DIAGNOSIS — I10 Essential (primary) hypertension: Secondary | ICD-10-CM | POA: Diagnosis not present

## 2022-04-06 DIAGNOSIS — I10 Essential (primary) hypertension: Secondary | ICD-10-CM | POA: Diagnosis not present

## 2022-04-06 DIAGNOSIS — E785 Hyperlipidemia, unspecified: Secondary | ICD-10-CM | POA: Diagnosis not present

## 2022-04-08 DIAGNOSIS — E785 Hyperlipidemia, unspecified: Secondary | ICD-10-CM | POA: Diagnosis not present

## 2022-04-08 DIAGNOSIS — I10 Essential (primary) hypertension: Secondary | ICD-10-CM | POA: Diagnosis not present

## 2022-04-12 DIAGNOSIS — I10 Essential (primary) hypertension: Secondary | ICD-10-CM | POA: Diagnosis not present

## 2022-04-12 DIAGNOSIS — E785 Hyperlipidemia, unspecified: Secondary | ICD-10-CM | POA: Diagnosis not present

## 2022-04-15 ENCOUNTER — Telehealth: Payer: Self-pay | Admitting: Family Medicine

## 2022-04-15 ENCOUNTER — Telehealth: Payer: Medicare Other

## 2022-04-15 DIAGNOSIS — E785 Hyperlipidemia, unspecified: Secondary | ICD-10-CM | POA: Diagnosis not present

## 2022-04-15 DIAGNOSIS — I10 Essential (primary) hypertension: Secondary | ICD-10-CM | POA: Diagnosis not present

## 2022-04-15 NOTE — Telephone Encounter (Signed)
Left VM to rtn call. Dm/cma       

## 2022-04-15 NOTE — Telephone Encounter (Signed)
Kelly from [facility] wants a verbal order to continue home PT 1xw x4w. [phone] ?

## 2022-04-15 NOTE — Telephone Encounter (Signed)
Spoke to [place] and gave Verbal okay.  Dm/cma ? ?

## 2022-04-21 DIAGNOSIS — I10 Essential (primary) hypertension: Secondary | ICD-10-CM | POA: Diagnosis not present

## 2022-04-21 DIAGNOSIS — E785 Hyperlipidemia, unspecified: Secondary | ICD-10-CM | POA: Diagnosis not present

## 2022-04-26 ENCOUNTER — Telehealth: Payer: Self-pay | Admitting: Family Medicine

## 2022-04-26 NOTE — Telephone Encounter (Signed)
Tanner Griffin from [facility] is needing verbal orders to irrigate the urine catheter. Please call pt at [phone]. ?

## 2022-04-27 NOTE — Telephone Encounter (Signed)
Verbal orders given, written orders will be sent.  ?

## 2022-05-04 DIAGNOSIS — R339 Retention of urine, unspecified: Secondary | ICD-10-CM | POA: Diagnosis not present

## 2022-05-10 ENCOUNTER — Telehealth: Payer: Self-pay | Admitting: Family Medicine

## 2022-05-10 NOTE — Telephone Encounter (Signed)
Pts wife called and is very frustrated because they are not getting any help with Tanner Griffin. His bag for urine keeps leaking and she cleans it as well as she can.  She really needs help from a [facility] agency. Please advise.

## 2022-05-11 NOTE — Telephone Encounter (Signed)
Pt returned call but hung up

## 2022-05-11 NOTE — Telephone Encounter (Signed)
Returned call on wife's number no answer unable to leave message, called patients phone LMTCB.

## 2022-05-11 NOTE — Telephone Encounter (Signed)
Spoke with patients wife who states that pt needs someone to come on every day to check cath it does not seems like it is working correctly and tends to leak a lot. Contact number for wife [phone]) [phone] instead of numbers in cart. Informed that patient need to update at next office visit.

## 2022-05-12 ENCOUNTER — Ambulatory Visit: Payer: Medicare Other | Admitting: Nurse Practitioner

## 2022-05-12 NOTE — Telephone Encounter (Signed)
Called patient and wife to inform that patient needs to be seen at urologist for evaluation, no answer on either contact numbers. Called number that wife provided couple of days ago but it's not working. Will try again.

## 2022-05-12 NOTE — Telephone Encounter (Signed)
Patients wife calling states that patient needs someone to come to the home to check catheter a few times a week asking if they could have home health to come out? Please advise

## 2022-05-13 ENCOUNTER — Telehealth: Payer: Self-pay | Admitting: Family Medicine

## 2022-05-13 ENCOUNTER — Telehealth: Payer: Self-pay

## 2022-05-13 ENCOUNTER — Ambulatory Visit: Payer: Medicare Other | Admitting: Nurse Practitioner

## 2022-05-13 ENCOUNTER — Ambulatory Visit: Payer: Medicare Other | Admitting: Family Medicine

## 2022-05-13 NOTE — Telephone Encounter (Signed)
Called and spoke to patient to gather more information regarding his foley problems and per encounter provider for 5/25 appt, advised him we do not have foley supplies here if he is having an issue and to follow up with his urologist. Pt was confused and thought appt was with Dr. Ethelene Hal and when told he was seeing the NP instead pt disconnected the phone line. Sw, cma

## 2022-05-13 NOTE — Telephone Encounter (Signed)
Pt was a no show on 05/13/22 for an OV with Dr. Ethelene Hal, I sent a no show letter.

## 2022-05-19 ENCOUNTER — Encounter: Payer: Self-pay | Admitting: Family Medicine

## 2022-05-19 ENCOUNTER — Ambulatory Visit (INDEPENDENT_AMBULATORY_CARE_PROVIDER_SITE_OTHER): Payer: Medicare Other | Admitting: Family Medicine

## 2022-05-19 ENCOUNTER — Ambulatory Visit: Payer: Medicare Other | Admitting: Family Medicine

## 2022-05-19 VITALS — BP 154/82 | HR 52 | Temp 96.7°F | Ht 68.0 in | Wt 155.0 lb

## 2022-05-19 DIAGNOSIS — E785 Hyperlipidemia, unspecified: Secondary | ICD-10-CM | POA: Diagnosis not present

## 2022-05-19 DIAGNOSIS — N401 Enlarged prostate with lower urinary tract symptoms: Secondary | ICD-10-CM

## 2022-05-19 DIAGNOSIS — N138 Other obstructive and reflux uropathy: Secondary | ICD-10-CM

## 2022-05-19 DIAGNOSIS — I152 Hypertension secondary to endocrine disorders: Secondary | ICD-10-CM | POA: Diagnosis not present

## 2022-05-19 DIAGNOSIS — E1159 Type 2 diabetes mellitus with other circulatory complications: Secondary | ICD-10-CM | POA: Diagnosis not present

## 2022-05-19 DIAGNOSIS — M889 Osteitis deformans of unspecified bone: Secondary | ICD-10-CM | POA: Diagnosis not present

## 2022-05-19 DIAGNOSIS — I10 Essential (primary) hypertension: Secondary | ICD-10-CM

## 2022-05-19 DIAGNOSIS — Z659 Problem related to unspecified psychosocial circumstances: Secondary | ICD-10-CM

## 2022-05-19 LAB — BASIC METABOLIC PANEL
BUN: 24 mg/dL — ABNORMAL HIGH (ref 6–23)
CO2: 30 mEq/L (ref 19–32)
Calcium: 9.8 mg/dL (ref 8.4–10.5)
Chloride: 107 mEq/L (ref 96–112)
Creatinine, Ser: 0.97 mg/dL (ref 0.40–1.50)
GFR: 74.45 mL/min (ref >60.00)
Glucose, Bld: 108 mg/dL — ABNORMAL HIGH (ref 70–99)
Potassium: 4.3 mEq/L (ref 3.5–5.1)
Sodium: 142 mEq/L (ref 135–145)

## 2022-05-19 MED ORDER — ATORVASTATIN CALCIUM 40 MG PO TABS: 40.0000 mg | ORAL_TABLET | Freq: Every day | ORAL | 3 refills | Status: AC

## 2022-05-19 MED ORDER — AMLODIPINE BESYLATE 10 MG PO TABS: 10.0000 mg | ORAL_TABLET | Freq: Every day | ORAL | 3 refills | Status: DC

## 2022-05-19 MED ORDER — ALENDRONATE SODIUM 70 MG PO TABS: 70.0000 mg | ORAL_TABLET | ORAL | 4 refills | Status: AC

## 2022-05-19 NOTE — Progress Notes (Signed)
Established Patient Office Visit  Subjective   Patient ID: Tanner Griffin, male    DOB: Apr 09, 1941  Age: 81 y.o. MRN: [id number]  Chief Complaint  Patient presents with   Follow-up    3 month follow up concerns about urologist referral and cath leaking.     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  Home health monthly and changing his catheter.  Has not seen urology for follow-up.  Needs refills on Norvasc and atorvastatin as well as alendronate for his Paget's disease.    Review of Systems  Constitutional: Negative.   HENT: Negative.    Eyes:  Negative for blurred vision, discharge and redness.  Respiratory: Negative.    Cardiovascular: Negative.   Gastrointestinal:  Negative for abdominal pain.  Genitourinary: Negative.   Musculoskeletal: Negative.  Negative for myalgias.  Skin:  Negative for rash.  Neurological:  Negative for tingling, loss of consciousness and weakness.  Endo/Heme/Allergies:  Negative for polydipsia.     Objective:     BP (!) 154/82 (BP Location: Left Arm, Patient Position: Sitting, Cuff Size: Normal)   Pulse (!) 52   Temp (!) 96.7 F (35.9 C) (Temporal)   Ht '5\' 8"'$  (1.727 m)   Wt 155 lb (70.3 kg)   SpO2 99%   BMI 23.57 kg/m  BP Readings from Last 3 Encounters:  05/19/22 (!) 154/82  02/18/22 118/68  11/26/21 140/82      Physical Exam Constitutional:      General: He is not in acute distress.    Appearance: Normal appearance. He is not ill-appearing, toxic-appearing or diaphoretic.  HENT:     Head: Normocephalic and atraumatic.     Right Ear: External ear normal.     Left Ear: External ear normal.  Eyes:     General: No scleral icterus.       Right eye: No discharge.        Left eye: No discharge.     Extraocular Movements: Extraocular movements intact.     Conjunctiva/sclera: Conjunctivae normal.  Cardiovascular:     Rate and Rhythm: Normal  rate and regular rhythm.  Pulmonary:     Effort: Pulmonary effort is normal. No respiratory distress.     Breath sounds: Normal breath sounds.  Abdominal:     General: Bowel sounds are normal.  Musculoskeletal:     Cervical back: No rigidity or tenderness.  Skin:    General: Skin is warm and dry.  Neurological:     Mental Status: He is alert and oriented to person, place, and time.  Psychiatric:        Mood and Affect: Mood normal.        Behavior: Behavior normal.     Results for orders placed or performed in visit on 27/03/50  Basic metabolic panel  Result Value Ref Range   Sodium 142 135 - 145 mEq/L   Potassium 4.3 3.5 - 5.1 mEq/L   Chloride 107 96 - 112 mEq/L   CO2 30 19 - 32 mEq/L   Glucose, Bld 108 (H) 70 - 99 mg/dL   BUN 24 (H) 6 - 23 mg/dL   Creatinine, Ser 0.97 0.40 - 1.50 mg/dL   GFR 74.45 >60.00 mL/min   Calcium 9.8 8.4 - 10.5 mg/dL      The 10-year ASCVD risk score (Arnett DK, et al., 2019) is: 49.8%    Assessment & Plan:   Problem List Items Addressed  This Visit       Cardiovascular and Mediastinum   Essential hypertension   Relevant Medications   atorvastatin (LIPITOR) 40 MG tablet   amLODipine (NORVASC) 10 MG tablet   Other Relevant Orders   Basic metabolic panel (Completed)     Musculoskeletal and Integument   Paget's disease of the bone   Relevant Medications   alendronate (FOSAMAX) 70 MG tablet     Genitourinary   BPH with obstruction/lower urinary tract symptoms - Primary   Relevant Orders   Ambulatory referral to Urology     Other   Hyperlipidemia   Relevant Medications   atorvastatin (LIPITOR) 40 MG tablet   amLODipine (NORVASC) 10 MG tablet   Poor social situation   Relevant Orders   Ambulatory referral to Social Work   Other Visit Diagnoses     Hypertension associated with diabetes ([place])       Relevant Medications   atorvastatin (LIPITOR) 40 MG tablet   amLODipine (NORVASC) 10 MG tablet       Return in about 3  months (around 08/19/2022).  Restart medicines as above.  Urology referral for BPH with obstruction with chronic indwelling catheter.  Libby Maw, MD

## 2022-05-20 DIAGNOSIS — Z659 Problem related to unspecified psychosocial circumstances: Secondary | ICD-10-CM | POA: Insufficient documentation

## 2022-05-20 NOTE — Addendum Note (Signed)
Addended by: Abelino Derrick A on: 05/20/2022 02:06 PM   Modules accepted: Orders

## 2022-05-21 NOTE — Telephone Encounter (Signed)
Pt is still having a lot of issues with his foley bag, no one has changed it for him in over one month. Please advise [name] (spouse) at [phone]. He does have an upcoming app on Monday, they can't make today 05/21/22.

## 2022-05-21 NOTE — Telephone Encounter (Signed)
error 

## 2022-05-24 ENCOUNTER — Telehealth: Payer: Self-pay | Admitting: Family Medicine

## 2022-05-24 ENCOUNTER — Ambulatory Visit: Payer: Medicare Other | Admitting: Family Medicine

## 2022-05-24 NOTE — Telephone Encounter (Signed)
Pt is wanting to transfer his care from Dr Ethelene Hal to Dr. Gena Fray. He said there was no reason why, he would just like to start seeing Dr. Gena Fray. Let me know and I can call pt and schedule a toc.

## 2022-05-24 NOTE — Telephone Encounter (Signed)
Spoke with patient and his wife wo verbally understood appointment with urologist scheduled for patient to go in and have this evaluated. Patient states that he will keep the appointment

## 2022-05-31 DIAGNOSIS — N3281 Overactive bladder: Secondary | ICD-10-CM | POA: Diagnosis not present

## 2022-05-31 DIAGNOSIS — R3915 Urgency of urination: Secondary | ICD-10-CM | POA: Diagnosis not present

## 2022-06-10 NOTE — Telephone Encounter (Signed)
Returned call no answer unable to leave message will call back.

## 2022-06-10 NOTE — Telephone Encounter (Signed)
[name] (spouse) is saying he is completely out of foley bags. She also really needs help at home with him. Please advise [name], she is very worried.

## 2022-06-11 NOTE — Telephone Encounter (Signed)
Returned call; no answer; unable to leave message

## 2022-06-16 NOTE — Telephone Encounter (Signed)
Have tried reaching patient several times, contact number is a non working number.

## 2022-06-28 ENCOUNTER — Ambulatory Visit: Payer: Medicare Other | Admitting: Family Medicine

## 2022-06-29 ENCOUNTER — Telehealth: Payer: Self-pay | Admitting: Family Medicine

## 2022-06-29 NOTE — Telephone Encounter (Signed)
Pt was a no show 06/28/2022 for an OV with Dr. Ethelene Hal, I have sent out a no show letter

## 2022-07-01 DIAGNOSIS — R3914 Feeling of incomplete bladder emptying: Secondary | ICD-10-CM | POA: Diagnosis not present

## 2022-07-05 ENCOUNTER — Encounter: Payer: Self-pay | Admitting: Family Medicine

## 2022-07-05 ENCOUNTER — Ambulatory Visit (INDEPENDENT_AMBULATORY_CARE_PROVIDER_SITE_OTHER): Payer: Medicare Other | Admitting: Family Medicine

## 2022-07-05 VITALS — BP 124/70 | HR 61 | Temp 97.4°F | Ht 68.0 in | Wt 155.6 lb

## 2022-07-05 DIAGNOSIS — L989 Disorder of the skin and subcutaneous tissue, unspecified: Secondary | ICD-10-CM

## 2022-07-05 DIAGNOSIS — R195 Other fecal abnormalities: Secondary | ICD-10-CM

## 2022-07-05 DIAGNOSIS — M1711 Unilateral primary osteoarthritis, right knee: Secondary | ICD-10-CM | POA: Diagnosis not present

## 2022-07-05 MED ORDER — DICLOFENAC SODIUM 1 % EX GEL: CUTANEOUS | 2 refills | Status: AC

## 2022-07-05 MED ORDER — BISMUTH SUBSALICYLATE 262 MG PO CHEW: 524.0000 mg | CHEWABLE_TABLET | ORAL | 0 refills | Status: AC | PRN

## 2022-07-05 NOTE — Progress Notes (Signed)
Established Patient Office Visit  Subjective   Patient ID: Tanner Griffin, male    DOB: 05/17/1941  Age: 81 y.o. MRN: [id number]  Chief Complaint  Patient presents with   GI Problem    Stomach issues, little diarrhea, poor appetite, sores on buttocks.     GI Problem Primary symptoms do not include abdominal pain, diarrhea, melena, myalgias or rash.  The illness does not include constipation or itching.   for follow-up of fecal incontinence.  Stools are formed and loose at times.  There is no blood or pus.  There is no watery diarrhea.  Sometimes passes stool when he feels the urge to pass gas.  He wears a adult diaper continuously.  He has been taking Maalox for this.  He denies indigestion.  Wife is concerned about sores on his backside.  It is difficult for him to move around secondary to arthritic pains in his right knee and pends.  He is status post knee replacement on the right.  He uses a walker for ambulation.  He does admit to sitting a lot.  He was able to see urology and has follow-up scheduled.    Review of Systems  Constitutional: Negative.   HENT: Negative.    Eyes:  Negative for blurred vision, discharge and redness.  Respiratory: Negative.    Cardiovascular: Negative.   Gastrointestinal:  Negative for abdominal pain, blood in stool, constipation, diarrhea and melena.  Genitourinary: Negative.   Musculoskeletal: Negative.  Negative for myalgias.  Skin:  Negative for itching and rash.  Neurological:  Negative for tingling, loss of consciousness and weakness.  Endo/Heme/Allergies:  Negative for polydipsia.      Objective:     BP 124/70 (BP Location: Right Arm, Patient Position: Sitting, Cuff Size: Normal)   Pulse 61   Temp (!) 97.4 F (36.3 C) (Temporal)   Ht '5\' 8"'$  (1.727 m)   Wt 155 lb 9.6 oz (70.6 kg)   SpO2 97%   BMI 23.66 kg/m  Wt Readings from Last 3 Encounters:  07/05/22 155 lb 9.6 oz (70.6 kg)  05/19/22 155 lb (70.3 kg)  02/18/22 160 lb 6.4 oz  (72.8 kg)      Physical Exam Constitutional:      General: He is not in acute distress.    Appearance: Normal appearance. He is not ill-appearing, toxic-appearing or diaphoretic.  HENT:     Head: Normocephalic and atraumatic.     Right Ear: External ear normal.     Left Ear: External ear normal.  Eyes:     General: No scleral icterus.       Right eye: No discharge.        Left eye: No discharge.     Extraocular Movements: Extraocular movements intact.     Conjunctiva/sclera: Conjunctivae normal.  Pulmonary:     Effort: Pulmonary effort is normal.  Skin:    General: Skin is warm and dry.       Neurological:     Mental Status: He is alert and oriented to person, place, and time.  Psychiatric:        Mood and Affect: Mood normal.        Behavior: Behavior normal.      No results found for any visits on 07/05/22.    The 10-year ASCVD risk score (Arnett DK, et al., 2019) is: 36.6%    Assessment & Plan:   Problem List Items Addressed This Visit       Musculoskeletal and  Integument   Skin erosion - Primary     Other   Loose stools   Relevant Medications   bismuth subsalicylate (PEPTO-BISMOL) 262 MG chewable tablet   Other Visit Diagnoses     Arthritis of knee, right       Relevant Medications   diclofenac Sodium (VOLTAREN) 1 % GEL       Return Has scheduled appointment in latter part of August..  Advised patient to get up and try to move around every hour and frequently change positions.  He understands that work prolonged sitting he may form an ulcer in this area.  Recommended Voltaren gel for his arthritis to make this easier.  Recommended Pepto-Bismol for any loose stools.  Libby Maw, MD

## 2022-07-08 DIAGNOSIS — M48061 Spinal stenosis, lumbar region without neurogenic claudication: Secondary | ICD-10-CM | POA: Diagnosis not present

## 2022-07-08 DIAGNOSIS — I10 Essential (primary) hypertension: Secondary | ICD-10-CM | POA: Diagnosis not present

## 2022-07-08 DIAGNOSIS — M159 Polyosteoarthritis, unspecified: Secondary | ICD-10-CM | POA: Diagnosis not present

## 2022-07-08 DIAGNOSIS — Z0000 Encounter for general adult medical examination without abnormal findings: Secondary | ICD-10-CM | POA: Diagnosis not present

## 2022-07-08 DIAGNOSIS — Z1159 Encounter for screening for other viral diseases: Secondary | ICD-10-CM | POA: Diagnosis not present

## 2022-07-08 DIAGNOSIS — M889 Osteitis deformans of unspecified bone: Secondary | ICD-10-CM | POA: Diagnosis not present

## 2022-07-08 DIAGNOSIS — Z79899 Other long term (current) drug therapy: Secondary | ICD-10-CM | POA: Diagnosis not present

## 2022-07-15 DIAGNOSIS — L280 Lichen simplex chronicus: Secondary | ICD-10-CM | POA: Diagnosis not present

## 2022-07-15 DIAGNOSIS — R2681 Unsteadiness on feet: Secondary | ICD-10-CM | POA: Diagnosis not present

## 2022-07-15 DIAGNOSIS — M159 Polyosteoarthritis, unspecified: Secondary | ICD-10-CM | POA: Diagnosis not present

## 2022-07-15 DIAGNOSIS — M889 Osteitis deformans of unspecified bone: Secondary | ICD-10-CM | POA: Diagnosis not present

## 2022-07-15 DIAGNOSIS — R7989 Other specified abnormal findings of blood chemistry: Secondary | ICD-10-CM | POA: Diagnosis not present

## 2022-07-15 DIAGNOSIS — Z0001 Encounter for general adult medical examination with abnormal findings: Secondary | ICD-10-CM | POA: Diagnosis not present

## 2022-07-15 DIAGNOSIS — I739 Peripheral vascular disease, unspecified: Secondary | ICD-10-CM | POA: Diagnosis not present

## 2022-07-23 ENCOUNTER — Encounter: Payer: Self-pay | Admitting: Family Medicine

## 2022-07-23 NOTE — Telephone Encounter (Signed)
No show 05/13/22 & 06/28/2022  2nd no show, fee generated (no fee for [facility]), final warning letter sent  Pt is scheduled for TOC to Dr. Gena Fray.

## 2022-08-09 ENCOUNTER — Inpatient Hospital Stay (HOSPITAL_COMMUNITY)
Admission: EM | Admit: 2022-08-09 | Discharge: 2022-08-13 | DRG: 698 | Disposition: A | Discharge status: Home Health | Payer: Medicare Other | Attending: Internal Medicine | Admitting: Internal Medicine

## 2022-08-09 ENCOUNTER — Emergency Department (HOSPITAL_COMMUNITY): Payer: Medicare Other

## 2022-08-09 ENCOUNTER — Encounter (HOSPITAL_COMMUNITY): Payer: Self-pay

## 2022-08-09 ENCOUNTER — Other Ambulatory Visit: Payer: Self-pay

## 2022-08-09 DIAGNOSIS — Z79899 Other long term (current) drug therapy: Secondary | ICD-10-CM

## 2022-08-09 DIAGNOSIS — Z20822 Contact with and (suspected) exposure to covid-19: Secondary | ICD-10-CM | POA: Diagnosis present

## 2022-08-09 DIAGNOSIS — Z888 Allergy status to other drugs, medicaments and biological substances status: Secondary | ICD-10-CM | POA: Diagnosis not present

## 2022-08-09 DIAGNOSIS — A4151 Sepsis due to Escherichia coli [E. coli]: Secondary | ICD-10-CM | POA: Diagnosis present

## 2022-08-09 DIAGNOSIS — T83511A Infection and inflammatory reaction due to indwelling urethral catheter, initial encounter: Principal | ICD-10-CM | POA: Diagnosis present

## 2022-08-09 DIAGNOSIS — G9341 Metabolic encephalopathy: Secondary | ICD-10-CM | POA: Diagnosis present

## 2022-08-09 DIAGNOSIS — Z96653 Presence of artificial knee joint, bilateral: Secondary | ICD-10-CM | POA: Diagnosis present

## 2022-08-09 DIAGNOSIS — N390 Urinary tract infection, site not specified: Secondary | ICD-10-CM | POA: Diagnosis not present

## 2022-08-09 DIAGNOSIS — R197 Diarrhea, unspecified: Secondary | ICD-10-CM | POA: Diagnosis present

## 2022-08-09 DIAGNOSIS — E7800 Pure hypercholesterolemia, unspecified: Secondary | ICD-10-CM | POA: Diagnosis present

## 2022-08-09 DIAGNOSIS — E876 Hypokalemia: Secondary | ICD-10-CM | POA: Diagnosis not present

## 2022-08-09 DIAGNOSIS — I10 Essential (primary) hypertension: Secondary | ICD-10-CM | POA: Diagnosis present

## 2022-08-09 DIAGNOSIS — G9340 Encephalopathy, unspecified: Principal | ICD-10-CM

## 2022-08-09 DIAGNOSIS — Y846 Urinary catheterization as the cause of abnormal reaction of the patient, or of later complication, without mention of misadventure at the time of the procedure: Secondary | ICD-10-CM | POA: Diagnosis present

## 2022-08-09 DIAGNOSIS — E870 Hyperosmolality and hypernatremia: Secondary | ICD-10-CM | POA: Diagnosis not present

## 2022-08-09 DIAGNOSIS — Z8546 Personal history of malignant neoplasm of prostate: Secondary | ICD-10-CM

## 2022-08-09 DIAGNOSIS — A419 Sepsis, unspecified organism: Principal | ICD-10-CM

## 2022-08-09 DIAGNOSIS — Z7983 Long term (current) use of bisphosphonates: Secondary | ICD-10-CM

## 2022-08-09 LAB — CBC WITH DIFFERENTIAL/PLATELET
Abs Immature Granulocytes: 0.04 10*3/uL (ref 0.00–0.07)
Basophils Absolute: 0.1 10*3/uL (ref 0.0–0.1)
Basophils Relative: 0 %
Eosinophils Absolute: 0.1 10*3/uL (ref 0.0–0.5)
Eosinophils Relative: 0 %
HCT: 33.6 % — ABNORMAL LOW (ref 39.0–52.0)
Hemoglobin: 11 g/dL — ABNORMAL LOW (ref 13.0–17.0)
Immature Granulocytes: 0 %
Lymphocytes Relative: 7 %
Lymphs Abs: 0.8 10*3/uL (ref 0.7–4.0)
MCH: 33.2 pg (ref 26.0–34.0)
MCHC: 32.7 g/dL (ref 30.0–36.0)
MCV: 101.5 fL — ABNORMAL HIGH (ref 80.0–100.0)
Monocytes Absolute: 0.8 10*3/uL (ref 0.1–1.0)
Monocytes Relative: 7 %
Neutro Abs: 10.1 10*3/uL — ABNORMAL HIGH (ref 1.7–7.7)
Neutrophils Relative %: 86 %
Platelets: 183 10*3/uL (ref 150–400)
RBC: 3.31 MIL/uL — ABNORMAL LOW (ref 4.22–5.81)
RDW: 12.5 % (ref 11.5–15.5)
WBC: 11.8 10*3/uL — ABNORMAL HIGH (ref 4.0–10.5)
nRBC: 0 % (ref 0.0–0.2)

## 2022-08-09 LAB — HEPATIC FUNCTION PANEL
ALT: 18 U/L (ref 0–44)
AST: 23 U/L (ref 15–41)
Albumin: 3.6 g/dL (ref 3.5–5.0)
Alkaline Phosphatase: 133 U/L — ABNORMAL HIGH (ref 38–126)
Bilirubin, Direct: 0.2 mg/dL (ref 0.0–0.2)
Indirect Bilirubin: 1.1 mg/dL — ABNORMAL HIGH (ref 0.3–0.9)
Total Bilirubin: 1.3 mg/dL — ABNORMAL HIGH (ref 0.3–1.2)
Total Protein: 6.7 g/dL (ref 6.5–8.1)

## 2022-08-09 LAB — URINALYSIS, ROUTINE W REFLEX MICROSCOPIC
Bilirubin Urine: NEGATIVE
Glucose, UA: NEGATIVE mg/dL
Ketones, ur: NEGATIVE mg/dL
Nitrite: POSITIVE — AB
Protein, ur: 100 mg/dL — AB
Specific Gravity, Urine: 1.016 (ref 1.005–1.030)
WBC, UA: >50 WBC/hpf — ABNORMAL HIGH (ref 0–5)
pH: 5 (ref 5.0–8.0)

## 2022-08-09 LAB — BASIC METABOLIC PANEL
Anion gap: 4 — ABNORMAL LOW (ref 5–15)
BUN: 17 mg/dL (ref 8–23)
CO2: 25 mmol/L (ref 22–32)
Calcium: 8.9 mg/dL (ref 8.9–10.3)
Chloride: 110 mmol/L (ref 98–111)
Creatinine, Ser: 0.85 mg/dL (ref 0.61–1.24)
GFR, Estimated: >60 mL/min (ref >60)
Glucose, Bld: 111 mg/dL — ABNORMAL HIGH (ref 70–99)
Potassium: 3.4 mmol/L — ABNORMAL LOW (ref 3.5–5.1)
Sodium: 139 mmol/L (ref 135–145)

## 2022-08-09 LAB — LACTIC ACID, PLASMA: Lactic Acid, Venous: 0.9 mmol/L (ref 0.5–1.9)

## 2022-08-09 MED ORDER — LIDOCAINE HCL URETHRAL/MUCOSAL 2 % EX GEL: 1.0000 | Freq: Once | CUTANEOUS | Status: DC

## 2022-08-09 MED ORDER — SODIUM CHLORIDE 0.9 % IV SOLN
2.0000 g | Freq: Once | INTRAVENOUS | Status: AC
  Administered 2022-08-09: 2 g via INTRAVENOUS
  Filled 2022-08-09: qty 12.5

## 2022-08-09 MED ORDER — LACTATED RINGERS IV BOLUS (SEPSIS)
1000.0000 mL | Freq: Once | INTRAVENOUS | Status: AC
  Administered 2022-08-10: 1000 mL via INTRAVENOUS

## 2022-08-09 MED ORDER — ACETAMINOPHEN 325 MG PO TABS
650.0000 mg | ORAL_TABLET | Freq: Once | ORAL | Status: AC
  Administered 2022-08-09: 650 mg via ORAL
  Filled 2022-08-09: qty 2

## 2022-08-09 MED ORDER — LACTATED RINGERS IV SOLN: INTRAVENOUS | Status: AC

## 2022-08-09 NOTE — ED Triage Notes (Signed)
Pt is A& O x4 with EMS but has some confusion with commands since 1400 states Family.  Pt has indwelling catheter and smells of Urine EMS stated.

## 2022-08-09 NOTE — ED Provider Notes (Signed)
[facility] DEPT Provider Note   CSN: [id number] Arrival date & time: 08/09/22  2147     History  Chief Complaint  Patient presents with   Altered Mental Status    KELSEN CELONA is a 81 y.o. male.  Patient here per EMS for possible confusion earlier this afternoon.  He is with no complaints.  He has no chest pain or shortness of breath.  No fever or chills.  He can tell me his name and where he is.  He has a history of high cholesterol hypertension.  He has a chronic indwelling Foley catheter that he states was changed last week.  This is from prostate cancer history.  Denies any falls.  Nothing has made it better or worse.  He has been having some arthritis in his legs at times.  Denies any weakness, numbness, chills.  No speech changes or vision changes.  No headaches.  No falls or other traumatic processes.  The history is provided by the patient.       Home Medications Prior to Admission medications   Medication Sig Start Date End Date Taking? Authorizing Provider  alendronate (FOSAMAX) 70 MG tablet Take 1 tablet (70 mg total) by mouth every 7 (seven) days. Take with a full glass of water on an empty stomach. Patient not taking: Reported on 07/05/2022 05/19/22   Libby Maw, MD  amLODipine (NORVASC) 10 MG tablet Take 1 tablet (10 mg total) by mouth daily. 05/19/22   Libby Maw, MD  atorvastatin (LIPITOR) 40 MG tablet Take 1 tablet (40 mg total) by mouth daily. 05/19/22   Libby Maw, MD  bismuth subsalicylate (PEPTO-BISMOL) 262 MG chewable tablet Chew 2 tablets (524 mg total) by mouth as needed. 07/05/22   Libby Maw, MD  diclofenac Sodium (VOLTAREN) 1 % GEL Apply a small grape sized dollop to painful joints as needed. 07/05/22   Libby Maw, MD  simethicone (GAS-X) 80 MG chewable tablet Chew 1 tablet (80 mg total) by mouth every 6 (six) hours as needed for flatulence. Patient not taking: Reported  on 05/19/2022 02/18/22   Libby Maw, MD      Allergies    Telmisartan    Review of Systems   Review of Systems  Physical Exam Updated Vital Signs  ED Triage Vitals  Enc Vitals Group     BP 08/09/22 2235 (!) 166/81     Pulse Rate 08/09/22 2200 79     Resp 08/09/22 2200 (!) 27     Temp 08/09/22 2235 (!) 100.7 F (38.2 C)     Temp Source 08/09/22 2235 Oral     SpO2 08/09/22 2158 98 %     Weight 08/09/22 2200 155 lb 10.3 oz (70.6 kg)     Height 08/09/22 2200 '5\' 8"'$  (1.727 m)     Head Circumference --      Peak Flow --      Pain Score 08/09/22 2205 6     Pain Loc --      Pain Edu? --      Excl. in [place]? --     Physical Exam Vitals and nursing note reviewed.  Constitutional:      General: He is not in acute distress.    Appearance: He is well-developed. He is not ill-appearing.  HENT:     Head: Normocephalic and atraumatic.     Nose: Nose normal.     Mouth/Throat:     Mouth: Mucous  membranes are moist.  Eyes:     Extraocular Movements: Extraocular movements intact.     Conjunctiva/sclera: Conjunctivae normal.     Pupils: Pupils are equal, round, and reactive to light.  Cardiovascular:     Rate and Rhythm: Normal rate and regular rhythm.     Pulses: Normal pulses.     Heart sounds: Normal heart sounds. No murmur heard. Pulmonary:     Effort: Pulmonary effort is normal. No respiratory distress.     Breath sounds: Normal breath sounds.  Abdominal:     Palpations: Abdomen is soft.     Tenderness: There is no abdominal tenderness.  Musculoskeletal:        General: No swelling.     Cervical back: Normal range of motion and neck supple.  Skin:    General: Skin is warm and dry.     Capillary Refill: Capillary refill takes less than 2 seconds.  Neurological:     General: No focal deficit present.     Mental Status: He is alert and oriented to person, place, and time.     Cranial Nerves: No cranial nerve deficit.     Sensory: No sensory deficit.     Motor: No  weakness.     Coordination: Coordination normal.     Comments: 5+ out of 5 strength throughout, normal sensation, no drift, normal speech, however at times he does seem a little bit slow to answer questions and maybe has some mild confusion  Psychiatric:        Mood and Affect: Mood normal.     ED Results / Procedures / Treatments   Labs (all labs ordered are listed, but only abnormal results are displayed) Labs Reviewed  URINALYSIS, ROUTINE W REFLEX MICROSCOPIC - Abnormal; Notable for the following components:      Result Value   APPearance CLOUDY (*)    Hgb urine dipstick MODERATE (*)    Protein, ur 100 (*)    Nitrite POSITIVE (*)    Leukocytes,Ua MODERATE (*)    WBC, UA >50 (*)    Bacteria, UA MANY (*)    All other components within normal limits  CBC WITH DIFFERENTIAL/PLATELET - Abnormal; Notable for the following components:   WBC 11.8 (*)    RBC 3.31 (*)    Hemoglobin 11.0 (*)    HCT 33.6 (*)    MCV 101.5 (*)    Neutro Abs 10.1 (*)    All other components within normal limits  BASIC METABOLIC PANEL - Abnormal; Notable for the following components:   Potassium 3.4 (*)    Glucose, Bld 111 (*)    Anion gap 4 (*)    All other components within normal limits  URINE CULTURE  RESP PANEL BY RT-PCR (FLU A&B, COVID) ARPGX2  CULTURE, BLOOD (ROUTINE X 2)  CULTURE, BLOOD (ROUTINE X 2)  LACTIC ACID, PLASMA  LACTIC ACID, PLASMA  PROTIME-INR  APTT  HEPATIC FUNCTION PANEL    EKG EKG Interpretation  Date/Time:  Monday August 09 2022 21:56:40 EDT Ventricular Rate:  75 PR Interval:    QRS Duration: 85 QT Interval:  347 QTC Calculation: 388 R Axis:   -32 Text Interpretation: NSR with artifact. Reconfirmed by Lennice Sites [phone]) on 08/09/2022 10:53:33 PM  Radiology DG Chest Port 1 View  Result Date: 08/09/2022 CLINICAL DATA:  Possible sepsis EXAM: PORTABLE CHEST 1 VIEW COMPARISON:  05/24/2019 FINDINGS: The heart size and mediastinal contours are within normal limits. Both  lungs are clear. The visualized skeletal  structures are unremarkable. IMPRESSION: No active disease. Electronically Signed   By: Donavan Foil M.D.   On: 08/09/2022 23:13   CT Head Wo Contrast  Result Date: 08/09/2022 CLINICAL DATA:  Mental status change, unknown cause EXAM: CT HEAD WITHOUT CONTRAST TECHNIQUE: Contiguous axial images were obtained from the base of the skull through the vertex without intravenous contrast. RADIATION DOSE REDUCTION: This exam was performed according to the departmental dose-optimization program which includes automated exposure control, adjustment of the mA and/or kV according to patient size and/or use of iterative reconstruction technique. COMPARISON:  None Available. FINDINGS: Brain: No intracranial hemorrhage, mass effect, or midline shift. Age related atrophy. No hydrocephalus. The basilar cisterns are patent. Mild periventricular and deep white matter hypodensity typical of chronic small vessel ischemia. No evidence of territorial infarct or acute ischemia. No extra-axial or intracranial fluid collection. Vascular: Atherosclerosis of skullbase vasculature without hyperdense vessel or abnormal calcification. Skull: No fracture or focal lesion. Sinuses/Orbits: Paranasal sinuses and mastoid air cells are clear. The visualized orbits are unremarkable. Other: None. IMPRESSION: 1. No acute intracranial abnormality. 2. Age related atrophy and chronic small vessel ischemia. Electronically Signed   By: Keith Rake M.D.   On: 08/09/2022 22:57    Procedures .Critical Care  Performed by: Lennice Sites, DO Authorized by: Lennice Sites, DO   Critical care provider statement:    Critical care time (minutes):  40   Critical care was necessary to treat or prevent imminent or life-threatening deterioration of the following conditions:  Sepsis   Critical care was time spent personally by me on the following activities:  Blood draw for specimens, development of treatment plan  with patient or surrogate, discussions with primary provider, evaluation of patient's response to treatment, examination of patient, obtaining history from patient or surrogate, ordering and performing treatments and interventions, ordering and review of laboratory studies, ordering and review of radiographic studies, pulse oximetry, re-evaluation of patient's condition and review of old charts   Care discussed with: admitting provider       Medications Ordered in ED Medications  lactated ringers infusion (has no administration in time range)  ceFEPIme (MAXIPIME) 2 g in sodium chloride 0.9 % 100 mL IVPB (2 g Intravenous New Bag/Given 08/09/22 2319)  lactated ringers bolus 1,000 mL (has no administration in time range)  lidocaine (XYLOCAINE) 2 % jelly 1 Application (has no administration in time range)  acetaminophen (TYLENOL) tablet 650 mg (650 mg Oral Given 08/09/22 2317)    ED Course/ Medical Decision Making/ A&P                           Medical Decision Making Amount and/or Complexity of Data Reviewed Labs: ordered. Radiology: ordered.  Risk OTC drugs. Prescription drug management. Decision regarding hospitalization.   [name] is here with altered mental status.  Arrives with fever, mild tachypnea, heart rate in the 90s.  Concern for sepsis and code sepsis initiated upon my knowledge of his vitals.  He has a chronic indwelling Foley catheter.  History of high cholesterol, hypertension, prostate cancer in the past, chronic indwelling Foley catheter that was exchanged last week.  Overall he appears to likely have some mild confusion on exam.  He denies any fall.  He can tell me his name where he is.  My suspicion is that he is got an infection from urinary source.  Urine looks cloudy and foul-smelling.  We will start IV cefepime, give fluid bolus  and give Tylenol.  Anticipate admission.  Sepsis order set initiated with CBC, blood cultures, lactic acid.  EKG per my review and  interpretation shows sinus rhythm.  No ischemic changes.  Per my review of chest x-ray my interpretation is that there is no pneumonia.  Head CT was also performed that was unremarkable.  Per my review and interpretation of labs there is no significant anemia or electrolyte abnormality.  Mild leukocytosis of 11.8.  Urinalysis consistent with infection.  Overall given catheter associated UTI and mild encephalopathy will admit for further sepsis care.  Hemodynamically stable.  Family has been updated.  This chart was dictated using voice recognition software.  Despite best efforts to proofread,  errors can occur which can change the documentation meaning.         Final Clinical Impression(s) / ED Diagnoses Final diagnoses:  Sepsis with encephalopathy without septic shock, due to unspecified organism [facility])  Urinary tract infection associated with indwelling urethral catheter, initial encounter [facility])    Rx / [facility] Orders ED Discharge Orders     None         Lennice Sites, DO 08/09/22 2334

## 2022-08-10 ENCOUNTER — Telehealth: Payer: Self-pay | Admitting: Family Medicine

## 2022-08-10 DIAGNOSIS — T83511A Infection and inflammatory reaction due to indwelling urethral catheter, initial encounter: Principal | ICD-10-CM

## 2022-08-10 DIAGNOSIS — N390 Urinary tract infection, site not specified: Secondary | ICD-10-CM

## 2022-08-10 LAB — BLOOD CULTURE ID PANEL (REFLEXED) - BCID2

## 2022-08-10 LAB — PROTIME-INR
INR: 1.1 (ref 0.8–1.2)
Prothrombin Time: 14.5 seconds (ref 11.4–15.2)

## 2022-08-10 LAB — APTT: aPTT: 27 seconds (ref 24–36)

## 2022-08-10 LAB — LACTIC ACID, PLASMA: Lactic Acid, Venous: 1.6 mmol/L (ref 0.5–1.9)

## 2022-08-10 LAB — RESP PANEL BY RT-PCR (FLU A&B, COVID) ARPGX2
Influenza A by PCR: NEGATIVE
Influenza B by PCR: NEGATIVE
SARS Coronavirus 2 by RT PCR: NEGATIVE

## 2022-08-10 MED ORDER — SODIUM CHLORIDE 0.9 % IV SOLN
2.0000 g | Freq: Three times a day (TID) | INTRAVENOUS | Status: DC
  Administered 2022-08-10: 2 g via INTRAVENOUS
  Filled 2022-08-10: qty 12.5

## 2022-08-10 MED ORDER — ACETAMINOPHEN 325 MG PO TABS: 650.0000 mg | ORAL_TABLET | Freq: Four times a day (QID) | ORAL | Status: DC | PRN

## 2022-08-10 MED ORDER — AMLODIPINE BESYLATE 10 MG PO TABS
10.0000 mg | ORAL_TABLET | Freq: Every day | ORAL | Status: DC
  Administered 2022-08-10 – 2022-08-13 (×4): 10 mg via ORAL
  Filled 2022-08-10: qty 1
  Filled 2022-08-10: qty 2
  Filled 2022-08-10 (×2): qty 1

## 2022-08-10 MED ORDER — ACETAMINOPHEN 650 MG RE SUPP: 650.0000 mg | Freq: Four times a day (QID) | RECTAL | Status: DC | PRN

## 2022-08-10 MED ORDER — SODIUM CHLORIDE 0.9 % IV SOLN
2.0000 g | INTRAVENOUS | Status: DC
  Administered 2022-08-10 – 2022-08-11 (×2): 2 g via INTRAVENOUS
  Filled 2022-08-10 (×2): qty 20

## 2022-08-10 MED ORDER — SODIUM CHLORIDE 0.9 % IV SOLN: INTRAVENOUS | Status: DC

## 2022-08-10 MED ORDER — ENOXAPARIN SODIUM 40 MG/0.4ML IJ SOSY
40.0000 mg | PREFILLED_SYRINGE | INTRAMUSCULAR | Status: DC
  Administered 2022-08-10 – 2022-08-12 (×3): 40 mg via SUBCUTANEOUS
  Filled 2022-08-10 (×3): qty 0.4

## 2022-08-10 MED ORDER — SENNA 8.6 MG PO TABS
1.0000 | ORAL_TABLET | Freq: Two times a day (BID) | ORAL | Status: DC
  Administered 2022-08-10 (×2): 8.6 mg via ORAL
  Filled 2022-08-10 (×2): qty 1

## 2022-08-10 MED ORDER — POLYETHYLENE GLYCOL 3350 17 G PO PACK: 17.0000 g | PACK | Freq: Every day | ORAL | Status: DC | PRN

## 2022-08-10 MED ORDER — HYDRALAZINE HCL 20 MG/ML IJ SOLN
10.0000 mg | Freq: Four times a day (QID) | INTRAMUSCULAR | Status: DC | PRN
  Administered 2022-08-11 – 2022-08-12 (×2): 10 mg via INTRAVENOUS
  Filled 2022-08-10 (×2): qty 1

## 2022-08-10 MED ORDER — ALBUTEROL SULFATE (2.5 MG/3ML) 0.083% IN NEBU
2.5000 mg | INHALATION_SOLUTION | Freq: Four times a day (QID) | RESPIRATORY_TRACT | Status: DC
  Administered 2022-08-10: 2.5 mg via RESPIRATORY_TRACT
  Filled 2022-08-10: qty 3

## 2022-08-10 MED ORDER — ATORVASTATIN CALCIUM 40 MG PO TABS
40.0000 mg | ORAL_TABLET | Freq: Every day | ORAL | Status: DC
  Administered 2022-08-10 – 2022-08-13 (×4): 40 mg via ORAL
  Filled 2022-08-10 (×4): qty 1

## 2022-08-10 NOTE — Telephone Encounter (Signed)
I attempted to leave message for patient to call back and schedule Medicare Annual Wellness Visit (AWV), but no voice mail.   Please offer to do virtually or by telephone.  Left office number and my jabber [phone].  Last AWV:10/16/2020  Please schedule at anytime with Nurse Health Advisor.

## 2022-08-10 NOTE — Sepsis Progress Note (Signed)
Monitoring for the code sepsis protocol. °

## 2022-08-10 NOTE — H&P (Signed)
[facility] History and Physical  BARD HAUPERT LFY:[id number] DOB: [birth year] DOA: 08/09/2022 PCP: Libby Maw, MD  Admitted from: Home Chief Complaint: Altered mental status, foul-smelling urine  History of Present Illness: Tanner Griffin is a 81 y.o. male with PMH significant for HTN, HLD, arthritis s/p bilateral knee replacement, prostate cancer s/p radioactive seed implant with indwelling Foley catheter, constipation, arthritis. Patient was brought to the ED from home on 8/21 with complaint of altered mental status.  EMS also noted foul-smelling urine.  No other complaint of chest pain, shortness of breath, fever, chills.  He has history of prostate cancer, has radioactive seed implants, follows up with urologist at [facility] u[facility].  He states he has a chronic indwelling Foley catheter in place, follows up with his PCP monthly for catheter exchange and states it was last changed last week. At baseline, patient is ambulatory with walker.  Lives at home with his wife and son's family.  In the ED, he had a temperature of 100.7, heart rate in 80s, blood pressure 160s, breathing on room air Patient was able to tell his name and where he is. Labs sodium 139, potassium 3.4, WC count 11.8, hemoglobin 11, MCV 101.5 Urinalysis with cloudy yellow urine with moderate amount leukocytes, positive nitrite, many bacteria Respiratory virus panel negative  Chest x-ray unremarkable CT head did not show any acute intracranial abnormality.  It showed age-related atrophy and chronic small vessel ischemia  Blood culture sent, urine culture sent patient was given IV antibiotics, IV fluid.  Foley catheter was changed. Hospitalist service was consulted for inpatient admission and management.  I received the case as a carryover admission from last night At the time of my evaluation, patient was lying on bed.  Alert, awake, oriented to place and person.  Cheerful.  Family not at  bedside.  Review of Systems:  All systems were reviewed and were negative unless otherwise mentioned in the HPI   Past medical history: Past Medical History:  Diagnosis Date   Arthritis    Arthrofibrosis of total knee arthroplasty, right 03/26/2014   Constipation    takes Miralax daily as needed   Headache    occasional   HYPERLIPIDEMIA    takes Lipitor daily   HYPERTENSION    takes Amlodipine and Labetalol daily   Joint pain    Joint swelling    Nocturia    Osteoarthritis of left knee 03/26/2014   Pneumonia    hx of-as a child   Prostate cancer ([place])    Urinary frequency    takes Flomax daily   Wears eyeglasses    Wears partial dentures    top     Past surgical history: Past Surgical History:  Procedure Laterality Date   COLONOSCOPY     HERNIA REPAIR     inguinal 04/14/17   INGUINAL HERNIA REPAIR Right 04/14/2017   Procedure: RIGHT INGUINAL HERNIA REPAIR WITH MESH;  Surgeon: Armandina Gemma, MD;  Location: [facility];  Service: General;  Laterality: Right;   INSERTION OF MESH Right 04/14/2017   Procedure: INSERTION OF MESH;  Surgeon: Armandina Gemma, MD;  Location: [facility];  Service: General;  Laterality: Right;   KNEE CLOSED REDUCTION Right 03/26/2014   Procedure: CLOSED MANIPULATION KNEE;  Surgeon: [name], MD;  Location: [place];  Service: Orthopedics;  Laterality: Right;   RADIOACTIVE SEED IMPLANT N/A 06/15/2019   Procedure: RADIOACTIVE SEED IMPLANT/BRACHYTHERAPY IMPLANT;  Surgeon: [name], MD;  Location: [place];  Service:  Urology;  Laterality: N/A;   SPACE OAR INSTILLATION N/A 06/15/2019   Procedure: SPACE OAR INSTILLATION;  Surgeon: [name], MD;  Location: [facility];  Service: Urology;  Laterality: N/A;   TOTAL KNEE ARTHROPLASTY  2011   right, Dr. [name] per pt   TOTAL KNEE ARTHROPLASTY Left 03/26/2014   Procedure: TOTAL KNEE ARTHROPLASTY;  Surgeon: [name], MD;  Location: [place];  Service: Orthopedics;  Laterality:  Left;    Social History:  reports that he has never smoked. He has never used smokeless tobacco. He reports that he does not drink alcohol and does not use drugs.  Allergies:  Allergies  Allergen Reactions   Telmisartan Other (See Comments)    Reaction:  Headache    Telmisartan   Family history:  Family History  Problem Relation Age of Onset   Brain cancer Father    Renal Disease Brother        ESRD, unknown cause   Stomach cancer Neg Hx    Esophageal cancer Neg Hx    Rectal cancer Neg Hx    Breast cancer Neg Hx    Colon cancer Neg Hx    Pancreatic cancer Neg Hx      Home Meds: Prior to Admission medications   Medication Sig Start Date End Date Taking? Authorizing Provider  alendronate (FOSAMAX) 70 MG tablet Take 1 tablet (70 mg total) by mouth every 7 (seven) days. Take with a full glass of water on an empty stomach. Patient not taking: Reported on 07/05/2022 05/19/22   Libby Maw, MD  amLODipine (NORVASC) 10 MG tablet Take 1 tablet (10 mg total) by mouth daily. 05/19/22   Libby Maw, MD  atorvastatin (LIPITOR) 40 MG tablet Take 1 tablet (40 mg total) by mouth daily. 05/19/22   Libby Maw, MD  bismuth subsalicylate (PEPTO-BISMOL) 262 MG chewable tablet Chew 2 tablets (524 mg total) by mouth as needed. 07/05/22   Libby Maw, MD  diclofenac Sodium (VOLTAREN) 1 % GEL Apply a small grape sized dollop to painful joints as needed. 07/05/22   Libby Maw, MD  simethicone (GAS-X) 80 MG chewable tablet Chew 1 tablet (80 mg total) by mouth every 6 (six) hours as needed for flatulence. Patient not taking: Reported on 05/19/2022 02/18/22   Libby Maw, MD    Physical Exam: Vitals:   08/10/22 0645 08/10/22 0647 08/10/22 0930 08/10/22 0945  BP: (!) 174/83 (!) 174/83 (!) 173/79 (!) 171/84  Pulse: 80 80 61 64  Resp:  '18 19 16  ' Temp:  98.4 F (36.9 C)    TempSrc:  Oral    SpO2: 97% 97% 98% 100%  Weight:      Height:        Wt Readings from Last 3 Encounters:  08/09/22 70.6 kg  07/05/22 70.6 kg  05/19/22 70.3 kg   Body mass index is 23.67 kg/m.  General exam: Pleasant, elderly African-American male.  Not in physical distress Skin: No rashes, lesions or ulcers. HEENT: Atraumatic, normocephalic, no obvious bleeding Lungs: Clear to auscultation bilaterally CVS: Regular rate and rhythm, no murmur GI/Abd soft, nontender, nondistended, bowel sound present CNS: Alert, awake, oriented to place and person Psychiatry: Mood appropriate Extremities: Trace bilateral pedal edema which he says is improving     Consult Orders  (From admission, onward)           Start     Ordered   08/10/22 0959  PT eval and treat  Routine        08/10/22 0959            Labs on Admission:   CBC: Recent Labs  Lab 08/09/22 2224  WBC 11.8*  NEUTROABS 10.1*  HGB 11.0*  HCT 33.6*  MCV 101.5*  PLT 601    Basic Metabolic Panel: Recent Labs  Lab 08/09/22 2224  NA 139  K 3.4*  CL 110  CO2 25  GLUCOSE 111*  BUN 17  CREATININE 0.85  CALCIUM 8.9    Liver Function Tests: Recent Labs  Lab 08/09/22 2308  AST 23  ALT 18  ALKPHOS 133*  BILITOT 1.3*  PROT 6.7  ALBUMIN 3.6   No results for input(s): "LIPASE", "AMYLASE" in the last 168 hours. No results for input(s): "AMMONIA" in the last 168 hours.  Cardiac Enzymes: No results for input(s): "CKTOTAL", "CKMB", "CKMBINDEX", "TROPONINI" in the last 168 hours.  BNP (last 3 results) No results for input(s): "BNP" in the last 8760 hours.  ProBNP (last 3 results) No results for input(s): "PROBNP" in the last 8760 hours.  CBG: No results for input(s): "GLUCAP" in the last 168 hours.  Lipase     Component Value Date/Time   LIPASE 21.0 07/31/2015 1717     Urinalysis    Component Value Date/Time   COLORURINE YELLOW 08/09/2022 2210   APPEARANCEUR CLOUDY (A) 08/09/2022 2210   LABSPEC 1.016 08/09/2022 2210   PHURINE 5.0 08/09/2022 2210    GLUCOSEU NEGATIVE 08/09/2022 2210   GLUCOSEU NEGATIVE 11/26/2021 0912   HGBUR MODERATE (A) 08/09/2022 2210   HGBUR negative 06/25/2009 0926   BILIRUBINUR NEGATIVE 08/09/2022 2210   BILIRUBINUR negative 11/14/2018 1107   KETONESUR NEGATIVE 08/09/2022 2210   PROTEINUR 100 (A) 08/09/2022 2210   UROBILINOGEN 1.0 11/26/2021 0912   NITRITE POSITIVE (A) 08/09/2022 2210   LEUKOCYTESUR MODERATE (A) 08/09/2022 2210     Drugs of Abuse  No results found for: "LABOPIA", "COCAINSCRNUR", "LABBENZ", "AMPHETMU", "THCU", "LABBARB"    Radiological Exams on Admission: DG Chest Port 1 View  Result Date: 08/09/2022 CLINICAL DATA:  Possible sepsis EXAM: PORTABLE CHEST 1 VIEW COMPARISON:  05/24/2019 FINDINGS: The heart size and mediastinal contours are within normal limits. Both lungs are clear. The visualized skeletal structures are unremarkable. IMPRESSION: No active disease. Electronically Signed   By: Donavan Foil M.D.   On: 08/09/2022 23:13   CT Head Wo Contrast  Result Date: 08/09/2022 CLINICAL DATA:  Mental status change, unknown cause EXAM: CT HEAD WITHOUT CONTRAST TECHNIQUE: Contiguous axial images were obtained from the base of the skull through the vertex without intravenous contrast. RADIATION DOSE REDUCTION: This exam was performed according to the departmental dose-optimization program which includes automated exposure control, adjustment of the mA and/or kV according to patient size and/or use of iterative reconstruction technique. COMPARISON:  None Available. FINDINGS: Brain: No intracranial hemorrhage, mass effect, or midline shift. Age related atrophy. No hydrocephalus. The basilar cisterns are patent. Mild periventricular and deep white matter hypodensity typical of chronic small vessel ischemia. No evidence of territorial infarct or acute ischemia. No extra-axial or intracranial fluid collection. Vascular: Atherosclerosis of skullbase vasculature without hyperdense vessel or abnormal  calcification. Skull: No fracture or focal lesion. Sinuses/Orbits: Paranasal sinuses and mastoid air cells are clear. The visualized orbits are unremarkable. Other: None. IMPRESSION: 1. No acute intracranial abnormality. 2. Age related atrophy and chronic small vessel ischemia. Electronically Signed   By: Keith Rake M.D.   On: 08/09/2022 22:57     ------------------------------------------------------------------------------------------------------  Assessment/Plan: Principal Problem:   UTI (urinary tract infection) due to urinary indwelling catheter (HCC)  Sepsis POA  UTI related to chronic indwelling Foley catheter Presented with altered mental status, foul-smelling urine On presentation had a fever of 100.7, tachypnea of 27, WBC count elevated to 11.8 Urinalysis with cloudy yellow urine with moderate amount leukocytes, positive nitrite, many bacteria Met criteria for sepsis secondary to UTI Blood culture, urine culture sent Started on IV cefepime. Trend temperature and WBC count. Lactic acid level normal.  Keep maintenance IV fluid Recent Labs  Lab 08/09/22 2224 08/09/22 2308  WBC 11.8*  --   LATICACIDVEN  --  0.9   Prostate cancer s/p radioactive implant in place Chronic indwelling Foley catheter Foley catheter change in the ED Continue to [facility]  Essential hypertension PTA on amlodipine 10 mg daily.  Med list incomplete at this time Blood pressure elevated to 170s this morning.  Resume amlodipine this morning IV hydralazine as needed  Hyperlipidemia Lipitor 40 mg daily  Constipation Bowel regimen  Arthritis S/p history of bilateral knee replacement Uses walker at baseline.  PT eval  Goals of care - -  Code Status: Full Code   Diet:  Diet Order             Diet Heart Room service appropriate? Yes; Fluid consistency: Thin  Diet effective now                  DVT prophylaxis:  enoxaparin (LOVENOX) injection 40 mg Start:  08/10/22 2200   Antimicrobials: IV cefepime Fluid: NS at 75 mill per hour Consultants: None Family Communication: None at right side Dispo: The patient is from: Home              Anticipated d/c is to: Home hopefully, pending clinical course ------------------------------------------------------------------------------------- Severity of Illness: The appropriate patient status for this patient is INPATIENT. Inpatient status is judged to be reasonable and necessary in order to provide the required intensity of service to ensure the patient's safety. The patient's presenting symptoms, physical exam findings, and initial radiographic and laboratory data in the context of their chronic comorbidities is felt to place them at high risk for further clinical deterioration. Furthermore, it is not anticipated that the patient will be medically stable for discharge from the hospital within 2 midnights of admission.   * I certify that at the point of admission it is my clinical judgment that the patient will require inpatient hospital care spanning beyond 2 midnights from the point of admission due to high intensity of service, high risk for further deterioration and high frequency of surveillance required.*  Signed, Terrilee Croak, MD [facility] 08/10/2022

## 2022-08-10 NOTE — Progress Notes (Signed)
PHARMACY - PHYSICIAN COMMUNICATION CRITICAL VALUE ALERT - BLOOD CULTURE IDENTIFICATION (BCID)  Tanner Griffin is an 81 y.o. male who presented to [facility] on 08/09/2022 with a chief complaint of AMS and suspected UTI. Has chronic Foley, last changed last week  Assessment:  3/4 BCx bottles growing E coli (suspect urinary source), no ESBL or carbapenemase genes detected  Name of physician (or Provider) Contacted: [place]  Current antibiotics: Cefepime  Changes to prescribed antibiotics recommended: narrowing Cefepime to Rocephin 2g IV q24 hr per Pharmacy BCID protocol Recommendations accepted by provider  Results for orders placed or performed during the hospital encounter of 08/09/22  Blood Culture ID Panel (Reflexed) (Collected: 08/09/2022 11:08 PM)  Result Value Ref Range   Enterococcus faecalis NOT DETECTED NOT DETECTED   Enterococcus Faecium NOT DETECTED NOT DETECTED   Listeria monocytogenes NOT DETECTED NOT DETECTED   Staphylococcus species NOT DETECTED NOT DETECTED   Staphylococcus aureus (BCID) NOT DETECTED NOT DETECTED   Staphylococcus epidermidis NOT DETECTED NOT DETECTED   Staphylococcus lugdunensis NOT DETECTED NOT DETECTED   Streptococcus species NOT DETECTED NOT DETECTED   Streptococcus agalactiae NOT DETECTED NOT DETECTED   Streptococcus pneumoniae NOT DETECTED NOT DETECTED   Streptococcus pyogenes NOT DETECTED NOT DETECTED   A.calcoaceticus-baumannii NOT DETECTED NOT DETECTED   Bacteroides fragilis NOT DETECTED NOT DETECTED   Enterobacterales DETECTED (A) NOT DETECTED   Enterobacter cloacae complex NOT DETECTED NOT DETECTED   Escherichia coli DETECTED (A) NOT DETECTED   Klebsiella aerogenes NOT DETECTED NOT DETECTED   Klebsiella oxytoca NOT DETECTED NOT DETECTED   Klebsiella pneumoniae NOT DETECTED NOT DETECTED   Proteus species NOT DETECTED NOT DETECTED   Salmonella species NOT DETECTED NOT DETECTED   Serratia marcescens NOT DETECTED NOT DETECTED   Haemophilus  influenzae NOT DETECTED NOT DETECTED   Neisseria meningitidis NOT DETECTED NOT DETECTED   Pseudomonas aeruginosa NOT DETECTED NOT DETECTED   Stenotrophomonas maltophilia NOT DETECTED NOT DETECTED   Candida albicans NOT DETECTED NOT DETECTED   Candida auris NOT DETECTED NOT DETECTED   Candida glabrata NOT DETECTED NOT DETECTED   Candida krusei NOT DETECTED NOT DETECTED   Candida parapsilosis NOT DETECTED NOT DETECTED   Candida tropicalis NOT DETECTED NOT DETECTED   Cryptococcus neoformans/gattii NOT DETECTED NOT DETECTED   CTX-M ESBL NOT DETECTED NOT DETECTED   Carbapenem resistance IMP NOT DETECTED NOT DETECTED   Carbapenem resistance KPC NOT DETECTED NOT DETECTED   Carbapenem resistance NDM NOT DETECTED NOT DETECTED   Carbapenem resist OXA 48 LIKE NOT DETECTED NOT DETECTED   Carbapenem resistance VIM NOT DETECTED NOT DETECTED    Emmelina Mcloughlin A 08/10/2022  2:43 PM

## 2022-08-10 NOTE — TOC Initial Note (Addendum)
Transition of Care [facility]) - Initial/Assessment Note    Patient Details  Name: Tanner Griffin MRN: [id number] Date of Birth: Apr 23, 1941  Transition of Care [facility] CM/SW Contact:    Dessa Phi, RN Phone Number: 08/10/2022, 3:52 PM  Clinical Narrative: Await PT eval, & recc.Follow for d/c needs.                  Expected Discharge Plan: [place] Barriers to Discharge: Continued Medical Work up   Patient Goals and CMS Choice Patient states their goals for this hospitalization and ongoing recovery are:: Home CMS Medicare.gov Compare Post Acute Care list provided to:: Patient Represenative (must comment) (Mary(spouse)) Choice offered to / list presented to : Spouse  Expected Discharge Plan and Services Expected Discharge Plan: [place]   Discharge Planning Services: CM Consult   Living arrangements for the past 2 months: Single Family Home                                      Prior Living Arrangements/Services Living arrangements for the past 2 months: Single Family Home Lives with:: Spouse Patient language and need for interpreter reviewed:: Yes Do you feel safe going back to the place where you live?: Yes      Need for Family Participation in Patient Care: Yes (Comment) Care giver support system in place?: Yes (comment)   Criminal Activity/Legal Involvement Pertinent to Current Situation/Hospitalization: No - Comment as needed  Activities of Daily Living Home Assistive Devices/Equipment: Cane (specify quad or straight), Walker (specify type), Eyeglasses, Dentures (specify type), Other (Comment) (upper partial plate, indwelling foley catheter per pt) ADL Screening (condition at time of admission) Patient's cognitive ability adequate to safely complete daily activities?: No Is the patient deaf or have difficulty hearing?: No Does the patient have difficulty seeing, even when wearing glasses/contacts?: No Does the patient have  difficulty concentrating, remembering, or making decisions?: Yes (confusion today per ed notes) Patient able to express need for assistance with ADLs?: Yes Does the patient have difficulty dressing or bathing?: No Independently performs ADLs?: No Communication: Independent Dressing (OT): Independent Grooming: Independent Feeding: Independent Bathing: Independent Toileting: Needs assistance Is this a change from baseline?: Pre-admission baseline In/Out Bed: Needs assistance Is this a change from baseline?: Pre-admission baseline Walks in Home: Needs assistance Is this a change from baseline?: Pre-admission baseline Does the patient have difficulty walking or climbing stairs?: Yes Weakness of Legs: Both (arthritis in both legs) Weakness of Arms/Hands: None  Permission Sought/Granted Permission sought to share information with : Case Manager Permission granted to share information with : Yes, Verbal Permission Granted  Share Information with NAME:  (Case Manager)           Emotional Assessment Appearance:: Appears stated age Attitude/Demeanor/Rapport: Gracious Affect (typically observed): Accepting Orientation: : Oriented to Self, Oriented to Place, Oriented to  Time, Oriented to Situation Alcohol / Substance Use: Not Applicable Psych Involvement: No (comment)  Admission diagnosis:  UTI (urinary tract infection) due to urinary indwelling catheter ([place]) [Z12.458K, N39.0] Urinary tract infection associated with indwelling urethral catheter, initial encounter ([place]) [D98.338S, N39.0] Sepsis with encephalopathy without septic shock, due to unspecified organism ([place]) [A41.9, R65.20, G93.40] Patient Active Problem List   Diagnosis Date Noted   UTI (urinary tract infection) due to urinary indwelling catheter ([place]) 08/09/2022   Skin erosion 07/05/2022   Loose stools 07/05/2022  Poor social situation 05/20/2022   Flatulence/wind 02/18/2022   Impacted cerumen of left ear 12/01/2021    Bilateral impacted cerumen 10/28/2021   Degenerative arthritis of right elbow 04/06/2021   Distal radius fracture, right 05/30/2020   Macrocytic anemia 03/31/2020   Paget's disease of the bone 12/10/2019   Acute left-sided low back pain without sciatica 11/29/2019   Malignant neoplasm of prostate ([place]) 02/12/2019   Lumbar spinal stenosis 09/25/2018   Right lumbar radiculopathy 09/05/2018   Polyneuropathy 09/05/2018   Right leg weakness 07/26/2018   Elevated PSA 03/16/2018   Right inguinal hernia 03/14/2017   Degenerative disc disease, lumbar 01/11/2017   Arthralgia 04/26/2016   Pain due to total knee replacement (HCC) 04/26/2016   Chronic constipation 01/20/2015   Focal myoclonus 06/05/2014   Osteoarthritis of knee s/p bilateral knee replacements 03/26/2014   Spongiotic dermatitis 03/19/2013   Personal history of colonic adenoma 10/30/2012   BPH with obstruction/lower urinary tract symptoms 06/05/2012   Right knee pain 04/17/2011   Type II diabetes mellitus, well controlled ([place]) 02/12/2008   Hyperlipidemia 06/19/2007   Essential hypertension 06/19/2007   PCP:  Libby Maw, MD Pharmacy:   [id number] [address], [address] AT [facility] [place] [address] Phone: [phone] Fax: [facility] - [place], [address] [place] [address] Phone: [phone] Fax: [phone]     Social Determinants of Health (SDOH) Interventions    Readmission Risk Interventions     No data to display

## 2022-08-10 NOTE — Progress Notes (Signed)
Pharmacy Antibiotic Note  Tanner Griffin is a 81 y.o. male admitted on 08/09/2022 with  altered mental status.  Arrives with fever, mild tachypnea, heart rate in the 90s.  Concern for sepsis. He has a chronic indwelling Foley catheter.[name]  Pharmacy has been consulted for cefepime dosing for UTI  Plan: Cefepime 2gm IV q8h Follow renal function, cultures and clinical course  Height: '5\' 8"'$  (172.7 cm) Weight: 70.6 kg (155 lb 10.3 oz) IBW/kg (Calculated) : 68.4  Temp (24hrs), Avg:100.1 F (37.8 C), Min:99.5 F (37.5 C), Max:100.7 F (38.2 C)  Recent Labs  Lab 08/09/22 2224 08/09/22 2308  WBC 11.8*  --   CREATININE 0.85  --   LATICACIDVEN  --  0.9    Estimated Creatinine Clearance: 68.2 mL/min (by C-G formula based on SCr of 0.85 mg/dL).    Allergies  Allergen Reactions   Telmisartan Other (See Comments)    Reaction:  Headache      Thank you for allowing pharmacy to be a part of this patient's care.  Dolly Rias RPh 08/10/2022, 12:44 AM

## 2022-08-10 NOTE — Progress Notes (Signed)
Pt arrived to room 1413 via stretcher from the ED. Received report from Sharrie Rothman, [place]. Call bell within reach. Bedside report given to Tess, Therapist, sports.

## 2022-08-10 NOTE — Evaluation (Signed)
Physical Therapy Evaluation Patient Details Name: Tanner Griffin MRN: [id number] DOB: 1941/11/17 Today's Date: 08/10/2022  History of Present Illness  81 yo male admitted with UTI, AMS. Hx of bil TKAs, prostate ca.  Clinical Impression  On eval, pt required Min A for mobility. Once standing, noted pt to have a bowel incontinence episode. Deferred hallway ambulation and assisted pt onto bsc. Total assist for hygiene after using BSC. Assisted pt back to bed. Will plan to follow patient during this hospital stay. PT recommendation is for HHPT and home health aide, if possible. Pt reports he was told that he could have an aide but no one has arrived to his home yet.        Recommendations for follow up therapy are one component of a multi-disciplinary discharge planning process, led by the attending physician.  Recommendations may be updated based on patient status, additional functional criteria and insurance authorization.  Follow Up Recommendations Home health PT [facility] aide, if possible)      Assistance Recommended at Discharge Frequent or constant Supervision/Assistance  Patient can return home with the following  A little help with walking and/or transfers;A little help with bathing/dressing/bathroom;Assistance with cooking/housework;Assist for transportation;Help with stairs or ramp for entrance    Equipment Recommendations None recommended by PT  Recommendations for Other Services       Functional Status Assessment Patient has had a recent decline in their functional status and demonstrates the ability to make significant improvements in function in a reasonable and predictable amount of time.     Precautions / Restrictions Precautions Precautions: Fall Precaution Comments: incontinent Restrictions Weight Bearing Restrictions: No      Mobility  Bed Mobility Overal bed mobility: Needs Assistance Bed Mobility: Supine to Sit, Sit to Supine     Supine to sit: Min  assist, HOB elevated Sit to supine: Min assist, HOB elevated   General bed mobility comments: Assist for trunk and LEs. Increased time.    Transfers Overall transfer level: Needs assistance Equipment used: Rolling walker (2 wheels) Transfers: Sit to/from Stand, Bed to chair/wheelchair/BSC Sit to Stand: Min assist, From elevated surface   Step pivot transfers: Min assist       General transfer comment: Assist to rise, steady. Cues for safety, hand placement. Step pivot x 2 using RW. Increased time.    Ambulation/Gait               General Gait Details: Hallway ambulation deferred 2* bowel incontinence. Pt did take a few steps over from [facility] towards [place].   Stairs            Wheelchair Mobility    Modified Rankin (Stroke Patients Only)       Balance Overall balance assessment: Needs assistance         Standing balance support: Bilateral upper extremity supported, Reliant on assistive device for balance, During functional activity Standing balance-Leahy Scale: Poor                               Pertinent Vitals/Pain Pain Assessment Pain Assessment: No/denies pain    Home Living Family/patient expects to be discharged to:: Private residence Living Arrangements: Spouse/significant other;Children;Other relatives Available Help at Discharge: Family;Available 24 hours/day Type of Home: House Home Access: Stairs to enter   [employer] of Steps: 1   Home Layout: One level Home Equipment: Rollator (4 wheels);Rolling Walker (2 wheels);BSC/3in1;Shower seat  Prior Function Prior Level of Function : Needs assist             Mobility Comments: uses rollator for ambulation ADLs Comments: requires assistance     Hand Dominance        Extremity/Trunk Assessment   Upper Extremity Assessment Upper Extremity Assessment: Defer to OT evaluation    Lower Extremity Assessment Lower Extremity Assessment: Generalized weakness     Cervical / Trunk Assessment Cervical / Trunk Assessment: Normal  Communication   Communication: No difficulties  Cognition Arousal/Alertness: Awake/alert Behavior During Therapy: WFL for tasks assessed/performed Overall Cognitive Status: Within Functional Limits for tasks assessed                                          General Comments      Exercises     Assessment/Plan    PT Assessment Patient needs continued PT services  PT Problem List Decreased strength;Decreased mobility;Decreased activity tolerance;Decreased balance;Decreased knowledge of use of DME;Decreased range of motion       PT Treatment Interventions DME instruction;Therapeutic activities;Gait training;Functional mobility training;Balance training;Therapeutic exercise;Patient/family education    PT Goals (Current goals can be found in the Care Plan section)  Acute Rehab PT Goals Patient Stated Goal: home soon PT Goal Formulation: With patient/family Time For Goal Achievement: 08/24/22 Potential to Achieve Goals: Good    Frequency Min 3X/week     Co-evaluation               AM-PAC PT "6 Clicks" Mobility  Outcome Measure Help needed turning from your back to your side while in a flat bed without using bedrails?: A Little Help needed moving from lying on your back to sitting on the side of a flat bed without using bedrails?: A Little Help needed moving to and from a bed to a chair (including a wheelchair)?: A Little Help needed standing up from a chair using your arms (e.g., wheelchair or bedside chair)?: A Little Help needed to walk in hospital room?: A Little Help needed climbing 3-5 steps with a railing? : A Lot 6 Click Score: 17    End of Session Equipment Utilized During Treatment: Gait belt Activity Tolerance: Patient tolerated treatment well Patient left: in bed;with call bell/phone within reach;with family/visitor present   PT Visit Diagnosis: Muscle weakness  (generalized) (M62.81);Difficulty in walking, not elsewhere classified (R26.2)    Time: 7902-4097 PT Time Calculation (min) (ACUTE ONLY): 24 min   Charges:   PT Evaluation $PT Eval Moderate Complexity: 1 Mod             Doreatha Massed, PT Acute Rehabilitation  Office: [phone] Pager: [phone]

## 2022-08-10 NOTE — ED Notes (Signed)
Lunch tray delivered to pt. No needs identified at this time. Pt eating w/o assistance.

## 2022-08-11 DIAGNOSIS — N390 Urinary tract infection, site not specified: Secondary | ICD-10-CM | POA: Diagnosis not present

## 2022-08-11 DIAGNOSIS — T83511A Infection and inflammatory reaction due to indwelling urethral catheter, initial encounter: Secondary | ICD-10-CM | POA: Diagnosis not present

## 2022-08-11 LAB — BASIC METABOLIC PANEL
Anion gap: 7 (ref 5–15)
BUN: 12 mg/dL (ref 8–23)
CO2: 25 mmol/L (ref 22–32)
Calcium: 9.1 mg/dL (ref 8.9–10.3)
Chloride: 114 mmol/L — ABNORMAL HIGH (ref 98–111)
Creatinine, Ser: 0.81 mg/dL (ref 0.61–1.24)
GFR, Estimated: >60 mL/min (ref >60)
Glucose, Bld: 95 mg/dL (ref 70–99)
Potassium: 3.4 mmol/L — ABNORMAL LOW (ref 3.5–5.1)
Sodium: 146 mmol/L — ABNORMAL HIGH (ref 135–145)

## 2022-08-11 LAB — URINE CULTURE: Culture: 60000 — AB

## 2022-08-11 LAB — CBC
HCT: 36.5 % — ABNORMAL LOW (ref 39.0–52.0)
Hemoglobin: 12 g/dL — ABNORMAL LOW (ref 13.0–17.0)
MCH: 33.2 pg (ref 26.0–34.0)
MCHC: 32.9 g/dL (ref 30.0–36.0)
MCV: 101.1 fL — ABNORMAL HIGH (ref 80.0–100.0)
Platelets: 179 10*3/uL (ref 150–400)
RBC: 3.61 MIL/uL — ABNORMAL LOW (ref 4.22–5.81)
RDW: 12.5 % (ref 11.5–15.5)
WBC: 7.8 10*3/uL (ref 4.0–10.5)
nRBC: 0 % (ref 0.0–0.2)

## 2022-08-11 NOTE — Progress Notes (Signed)
PROGRESS NOTE  Tanner Griffin  DOB: 1941-06-25  PCP: Libby Maw, MD IHK:[id number]  DOA: 08/09/2022  LOS: 2 days  Hospital Day: 3  Brief narrative:  Tanner Griffin is a 81 y.o. male with PMH significant for HTN, HLD, arthritis s/p bilateral knee replacement, prostate cancer s/p radioactive seed implant with indwelling Foley catheter, constipation, arthritis. Patient was brought to the ED from home on 8/21 with complaint of altered mental status.  EMS also noted foul-smelling urine.  No other complaint of chest pain, shortness of breath, fever, chills.  He has history of prostate cancer, has radioactive seed implants, follows up with urologist at [facility] urology.  He states he has a chronic indwelling Foley catheter in place, follows up with his PCP monthly for catheter exchange and states it was last changed last week. At baseline, patient is ambulatory with walker.  Lives at home with his wife and son's family.  In the ED, he had a temperature of 100.7, heart rate in 80s, blood pressure 160s, breathing on room air Patient was able to tell his name and where he is. Labs sodium 139, potassium 3.4, WC count 11.8, hemoglobin 11, MCV 101.5 Urinalysis with cloudy yellow urine with moderate amount leukocytes, positive nitrite, many bacteria Respiratory virus panel negative  Chest x-ray unremarkable CT head did not show any acute intracranial abnormality.  It showed age-related atrophy and chronic small vessel ischemia  Blood culture sent, urine culture sent patient was given IV antibiotics, IV fluid.  Foley catheter was changed. Admitted to hospitalist service for further evaluation management. Blood culture obtained on admission showed E. coli in both aerobic and anaerobic bottles, pending sensitivity Urine culture grew 60,000 CFU per mL of E. coli  Subjective: Patient was seen and examined this morning.  Pleasant elderly African-American male.  Lying down in bed.  Not in  distress.  No new symptoms.  Wife at bedside. Wife mentions that patient has watery diarrhea for 2 to 3 weeks.  He cannot stay with this morning.  He did not mention that on admission yesterday. Labs this morning with sodium level elevated to 146, potassium low at 3.4  Assessment/Plan: Sepsis POA  E. coli bacteremia E. coli UTI UTI related to chronic indwelling Foley catheter Presented with altered mental status, foul-smelling urine On presentation had a fever of 100.7, tachypnea of 27, WBC count elevated to 11.8 Urinalysis with cloudy yellow urine with moderate amount leukocytes, positive nitrite, many bacteria Blood culture obtained on admission showed E. coli in both aerobic and anaerobic bottles, pending sensitivity Urine culture grew 60,000 CFU per mL of E. coli Currently improving on IV cefepime.  WBC count and lactic acid level normalized. Continue to monitor Recent Labs  Lab 08/09/22 2224 08/09/22 2308 08/10/22 1522 08/11/22 0545  WBC 11.8*  --   --  7.8  LATICACIDVEN  --  0.9 1.6  --     Prostate cancer s/p radioactive implant in place Chronic indwelling Foley catheter Foley catheter change in the ED [facility]  Diarrhea for 2 to 3 weeks Wife mentioned that this morning.  Obtain C diff assay.  Hypernatremia Sodium level elevated to 146 today.  Chloride level is also elevated.  This is probably because of overhydration from normal saline.  Stop IV fluid today.  Encourage oral hydration Recent Labs  Lab 08/09/22 2224 08/11/22 0545  NA 139 146*   Hypokalemia Potassium level low at 3.4 today.  Replacement given. Recent Labs  Lab 08/09/22 2224 08/11/22 0545  K 3.4* 3.4*   Essential hypertension Improving on amlodipine 10 mg daily. IV hydralazine as needed  Hyperlipidemia Lipitor 40 mg daily  Constipation Bowel regimen  Arthritis S/p history of bilateral knee replacement Uses walker at baseline.  PT eval  Goals of  care   Code Status: Full Code   Diet:  Diet Order             Diet Heart Room service appropriate? Yes; Fluid consistency: Thin  Diet effective now                  DVT prophylaxis:  enoxaparin (LOVENOX) injection 40 mg Start: 08/10/22 2200   Antimicrobials: IV cefepime Fluid: NS at 75 mill per hour.  Can stop IV fluid today Consultants: None Family Communication: None at right side  Status is: Inpatient  Continue in-hospital care because: Pending culture and sensitivity Level of care: Telemetry   Dispo: The patient is from: Home              Anticipated d/c is to: Home, pending clinical course              Patient currently is not medically stable to d/c.   Difficult to place patient No     Infusions:   cefTRIAXone (ROCEPHIN)  IV 2 g (08/10/22 1554)    Scheduled Meds:  amLODipine  10 mg Oral Daily   atorvastatin  40 mg Oral Daily   enoxaparin (LOVENOX) injection  40 mg Subcutaneous Q24H   lidocaine  1 Application Urethral Once   senna  1 tablet Oral BID    PRN meds: acetaminophen **OR** acetaminophen, hydrALAZINE   Antimicrobials: Anti-infectives (From admission, onward)    Start     Dose/Rate Route Frequency Ordered Stop   08/10/22 1600  cefTRIAXone (ROCEPHIN) 2 g in sodium chloride 0.9 % 100 mL IVPB        2 g 200 mL/hr over 30 Minutes Intravenous Every 24 hours 08/10/22 1442     08/10/22 0800  ceFEPIme (MAXIPIME) 2 g in sodium chloride 0.9 % 100 mL IVPB  Status:  Discontinued        2 g 200 mL/hr over 30 Minutes Intravenous Every 8 hours 08/10/22 0045 08/10/22 1442   08/09/22 2245  ceFEPIme (MAXIPIME) 2 g in sodium chloride 0.9 % 100 mL IVPB        2 g 200 mL/hr over 30 Minutes Intravenous  Once 08/09/22 2243 08/10/22 0002       Objective: Vitals:   08/11/22 0444 08/11/22 1430  BP: (!) 156/76 120/62  Pulse:  85  Resp:  20  Temp:  98.9 F (37.2 C)  SpO2:  100%    Intake/Output Summary (Last 24 hours) at 08/11/2022 1453 Last data filed  at 08/11/2022 0908 Gross per 24 hour  Intake 2029.54 ml  Output 2301 ml  Net -271.46 ml   Filed Weights   08/09/22 2200  Weight: 70.6 kg   Weight change:  Body mass index is 23.67 kg/m.   Physical Exam: General exam: Pleasant, elderly African-American male.  Not in physical distress Skin: No rashes, lesions or ulcers. HEENT: Atraumatic, normocephalic, no obvious bleeding Lungs: Clear to auscultation bilaterally CVS: Regular rate and rhythm, no murmur GI/Abd soft, nontender, nondistended, bowel sound present CNS: Alert, awake, oriented x3 Psychiatry: Mood appropriate Extremities: Improved bilateral pedal edema  Data Review: I have personally reviewed the laboratory data and studies available.  F/u labs ordered Unresulted  Labs (From admission, onward)     Start     Ordered   08/11/22 1108  C Difficile Quick Screen w PCR reflex  (C Difficile quick screen w PCR reflex panel )  Once, for 24 hours,   TIMED       References:    CDiff Information Tool   08/11/22 1107            Signed, Terrilee Croak, MD [facility] 08/11/2022

## 2022-08-11 NOTE — Progress Notes (Signed)
Nutrition Brief Note  Patient identified on the Malnutrition Screening Tool (MST) Report.  Wt Readings from Last 15 Encounters:  08/09/22 70.6 kg  07/05/22 70.6 kg  05/19/22 70.3 kg  02/18/22 72.8 kg  11/26/21 72.7 kg  11/02/21 72.9 kg  10/28/21 71.7 kg  05/12/21 70.1 kg  04/01/21 71 kg  03/10/21 71.8 kg  02/24/21 72.8 kg  02/17/21 72.1 kg  02/05/21 72.1 kg  01/01/21 72.9 kg  10/20/20 76.7 kg    Body mass index is 23.67 kg/m. Patient meets criteria for normal weight based on current BMI. Weight on 8/21 was 156 lb, weight on 5/31 was 155 lb, and weight on 3/2 was 160 lb. Skin WDL.  Current diet order is Heart Healthy and patient ate 100% of breakfast this AM (569 kcal and 24 grams protein) and lunch has been ordered but not yet arrived. Labs and medications reviewed.   No nutrition interventions warranted at this time. If nutrition issues arise, please consult RD.       Jarome Matin, MS, RD, LDN, [place] Registered Dietitian II Inpatient Clinical Nutrition RD pager # and on-call/weekend pager # available in [facility]

## 2022-08-11 NOTE — Progress Notes (Addendum)
Physical Therapy Treatment Patient Details Name: Tanner Griffin MRN: [id number] DOB: 25-Aug-1941 Today's Date: 08/11/2022   History of Present Illness 81 yo male admitted with UTI, AMS. Hx of bil TKAs, prostate ca,    PT Comments    Progressing with mobility. Pt tolerated activity well.   Recommendations for follow up therapy are one component of a multi-disciplinary discharge planning process, led by the attending physician.  Recommendations may be updated based on patient status, additional functional criteria and insurance authorization.  Follow Up Recommendations  Home health PT ([facility], if possible)     Assistance Recommended at Discharge Frequent or constant Supervision/Assistance  Patient can return home with the following A little help with walking and/or transfers;A little help with bathing/dressing/bathroom;Assistance with cooking/housework;Assist for transportation;Help with stairs or ramp for entrance   Equipment Recommendations  None recommended by PT    Recommendations for Other Services       Precautions / Restrictions Precautions Precautions: Fall Precaution Comments: incontinent Restrictions Weight Bearing Restrictions: No     Mobility  Bed Mobility Overal bed mobility: Needs Assistance Bed Mobility: Supine to Sit, Sit to Supine     Supine to sit: Mod assist, HOB elevated Sit to supine: Mod assist, HOB elevated   General bed mobility comments: Assist for trunk and LE . Increased time.    Transfers Overall transfer level: Needs assistance Equipment used: Rolling walker (2 wheels) Transfers: Sit to/from Stand Sit to Stand: Min assist, From elevated surface           General transfer comment: Min A to rise, steady.Cues for safety, hand placement.    Ambulation/Gait Ambulation/Gait assistance: Min assist Gait Distance (Feet): 215 Feet Assistive device: Rolling walker (2 wheels) Gait Pattern/deviations: Step-through pattern,  Decreased stride length, Trunk flexed       General Gait Details: Cues for safety, RW proximity, posture. Pt tends to maintain flexed trunk (uses rollator at baseline). Pt tolerated distance well.   Stairs             Wheelchair Mobility    Modified Rankin (Stroke Patients Only)       Balance Overall balance assessment: Needs assistance         Standing balance support: Bilateral upper extremity supported, During functional activity, Reliant on assistive device for balance Standing balance-Leahy Scale: Poor                              Cognition Arousal/Alertness: Awake/alert Behavior During Therapy: WFL for tasks assessed/performed Overall Cognitive Status: Within Functional Limits for tasks assessed                                          Exercises      General Comments        Pertinent Vitals/Pain Pain Assessment Pain Assessment: No/denies pain    Home Living Family/patient expects to be discharged to:: Private residence Living Arrangements: Spouse/significant other;Children;Other relatives Available Help at Discharge: Family;Available 24 hours/day Type of Home: House Home Access: Stairs to enter   [employer] of Steps: 1   Home Layout: One level Home Equipment: Rollator (4 wheels);Rolling Walker (2 wheels);BSC/3in1;Tub bench      Prior Function            PT Goals (current goals can now be found in the care plan section)  Progress towards PT goals: Progressing toward goals    Frequency    Min 3X/week      PT Plan Current plan remains appropriate    Co-evaluation              AM-PAC PT "6 Clicks" Mobility   Outcome Measure  Help needed turning from your back to your side while in a flat bed without using bedrails?: A Little Help needed moving from lying on your back to sitting on the side of a flat bed without using bedrails?: A Little Help needed moving to and from a bed to a chair  (including a wheelchair)?: A Little Help needed standing up from a chair using your arms (e.g., wheelchair or bedside chair)?: A Little Help needed to walk in hospital room?: A Little Help needed climbing 3-5 steps with a railing? : A Lot 6 Click Score: 17    End of Session Equipment Utilized During Treatment: Gait belt Activity Tolerance: Patient tolerated treatment well Patient left: in bed;with call bell/phone within reach;with bed alarm set;with family/visitor present Nurse Communication: Made NT aware that pt still had on depends and he was back to bed  PT Visit Diagnosis: Muscle weakness (generalized) (M62.81);Difficulty in walking, not elsewhere classified (R26.2)     Time: 6151-8343 PT Time Calculation (min) (ACUTE ONLY): 22 min  Charges:  $Gait Training: 8-22 mins                         Doreatha Massed, PT Acute Rehabilitation  Office: [phone] Pager: [phone]

## 2022-08-11 NOTE — Evaluation (Signed)
Occupational Therapy Evaluation Patient Details Name: Tanner Griffin MRN: [id number] DOB: 1941/07/29 Today's Date: 08/11/2022   History of Present Illness 81 yo male admitted with UTI, AMS. Hx of bil TKAs, prostate ca,   Clinical Impression   [name] is a 81 year old man admitted to hospital with above medical history and presents with generalized weakness, decreased activity tolerance, impaired balance and pain. Patient needing mod assist to power up and min guard  to ambulate with walker and increased assistance with ADLs. Today patient limited by stiff joints from arthritis and prolonged time in bed.  Patient will benefit from skilled OT services while in hospital to improve deficits and learn compensatory strategies as needed in order to return to PLOF. Do not expect he will have OT needs at discharge.       Recommendations for follow up therapy are one component of a multi-disciplinary discharge planning process, led by the attending physician.  Recommendations may be updated based on patient status, additional functional criteria and insurance authorization.   Follow Up Recommendations  No OT follow up    Assistance Recommended at Discharge Intermittent Supervision/Assistance  Patient can return home with the following A little help with bathing/dressing/bathroom;Assistance with cooking/housework;Help with stairs or ramp for entrance    Functional Status Assessment  Patient has had a recent decline in their functional status and demonstrates the ability to make significant improvements in function in a reasonable and predictable amount of time.  Equipment Recommendations  None recommended by OT    Recommendations for Other Services       Precautions / Restrictions Precautions Precautions: Fall Precaution Comments: incontinent of BM Restrictions Weight Bearing Restrictions: No      Mobility Bed Mobility Overal bed mobility: Needs Assistance Bed Mobility:  Supine to Sit     Supine to sit: Mod assist     General bed mobility comments: Assist for trunk and LEs and to scoot to edge . Increased time.    Transfers Overall transfer level: Needs assistance Equipment used: Rolling walker (2 wheels) Transfers: Sit to/from Stand Sit to Stand: Mod assist, From elevated surface           General transfer comment: Mod assist to stand and min guard to standby to ambulate with walker.      Balance           Standing balance support: Reliant on assistive device for balance                               ADL either performed or assessed with clinical judgement   ADL Overall ADL's : Needs assistance/impaired Eating/Feeding: Set up Eating/Feeding Details (indicate cue type and reason): PRN setup for help with containers Grooming: Supervision/safety;Standing   Upper Body Bathing: Supervision/ safety   Lower Body Bathing: Sit to/from stand;Minimal assistance   Upper Body Dressing : Minimal assistance   Lower Body Dressing: Moderate assistance   Toilet Transfer: Ambulation;Grab bars;Supervision/safety   Toileting- Clothing Manipulation and Hygiene: Supervision/safety;Sit to/from stand       Functional mobility during ADLs: Min guard;Supervision/safety       Vision Patient Visual Report: No change from baseline       Perception     Praxis      Pertinent Vitals/Pain Pain Assessment Pain Assessment: No/denies pain     Hand Dominance Right   Extremity/Trunk Assessment Upper Extremity Assessment Upper Extremity Assessment: RUE deficits/detail;LUE deficits/detail RUE  Deficits / Details: Shoulder ROM to just below shoulder level but other otherwise grossly functional ROM and strength.  arthritic changes in hands with decreased functional use. RUE Sensation: WNL RUE Coordination: decreased fine motor LUE Deficits / Details: Shoulder ROM to just below shoulder level but other otherwise grossly functional ROM  and strength.  arthritic changes in hands with decreased functional use. LUE Sensation: WNL LUE Coordination: decreased fine motor   Lower Extremity Assessment Lower Extremity Assessment: Defer to PT evaluation   Cervical / Trunk Assessment Cervical / Trunk Assessment: Kyphotic   Communication Communication Communication: No difficulties   Cognition Arousal/Alertness: Awake/alert Behavior During Therapy: WFL for tasks assessed/performed Overall Cognitive Status: Within Functional Limits for tasks assessed                                       General Comments       Exercises     Shoulder Instructions      Home Living Family/patient expects to be discharged to:: Private residence Living Arrangements: Spouse/significant other;Children;Other relatives Available Help at Discharge: Family;Available 24 hours/day Type of Home: House Home Access: Stairs to enter [employer] of Steps: 1   Home Layout: One level     Bathroom Shower/Tub: Tub/shower unit         Home Equipment: Rollator (4 wheels);Rolling Walker (2 wheels);BSC/3in1;Tub bench          Prior Functioning/Environment Prior Level of Function : Needs assist             Mobility Comments: uses rollator for ambulation ADLs Comments: requires assistance at times        OT Problem List: Decreased range of motion;Decreased activity tolerance;Impaired balance (sitting and/or standing);Pain;Decreased coordination      OT Treatment/Interventions: Self-care/ADL training;DME and/or AE instruction;Therapeutic activities;Balance training;Patient/family education    OT Goals(Current goals can be found in the care plan section) Acute Rehab OT Goals Patient Stated Goal: reduce stiffness in joints OT Goal Formulation: With patient Time For Goal Achievement: 08/25/22 Potential to Achieve Goals: Good  OT Frequency: Min 2X/week    Co-evaluation              AM-PAC OT "6 Clicks"  Daily Activity     Outcome Measure Help from another person eating meals?: A Little Help from another person taking care of personal grooming?: A Little Help from another person toileting, which includes using toliet, bedpan, or urinal?: A Little Help from another person bathing (including washing, rinsing, drying)?: A Little Help from another person to put on and taking off regular upper body clothing?: A Little Help from another person to put on and taking off regular lower body clothing?: A Lot 6 Click Score: 17   End of Session Equipment Utilized During Treatment: Rolling walker (2 wheels) Nurse Communication: Mobility status  Activity Tolerance: Patient tolerated treatment well Patient left: in chair;with call bell/phone within reach;with family/visitor present  OT Visit Diagnosis: Muscle weakness (generalized) (M62.81)                Time: 7829-5621 OT Time Calculation (min): 21 min Charges:  OT General Charges $OT Visit: 1 Visit OT Evaluation $OT Eval Low Complexity: 1 Low  Shellsea Borunda, OTR/L Acute Care Rehab Services  Office [phone] Pager: [phone]   Kelli Churn 08/11/2022, 2:42 PM

## 2022-08-12 DIAGNOSIS — T83511A Infection and inflammatory reaction due to indwelling urethral catheter, initial encounter: Secondary | ICD-10-CM | POA: Diagnosis not present

## 2022-08-12 DIAGNOSIS — N390 Urinary tract infection, site not specified: Secondary | ICD-10-CM | POA: Diagnosis not present

## 2022-08-12 LAB — C DIFFICILE QUICK SCREEN W PCR REFLEX
C Diff antigen: NEGATIVE
C Diff interpretation: NOT DETECTED
C Diff toxin: NEGATIVE

## 2022-08-12 LAB — BASIC METABOLIC PANEL
Anion gap: 5 (ref 5–15)
BUN: 13 mg/dL (ref 8–23)
CO2: 27 mmol/L (ref 22–32)
Calcium: 8.8 mg/dL — ABNORMAL LOW (ref 8.9–10.3)
Chloride: 113 mmol/L — ABNORMAL HIGH (ref 98–111)
Creatinine, Ser: 0.76 mg/dL (ref 0.61–1.24)
GFR, Estimated: >60 mL/min (ref >60)
Glucose, Bld: 93 mg/dL (ref 70–99)
Potassium: 3.2 mmol/L — ABNORMAL LOW (ref 3.5–5.1)
Sodium: 145 mmol/L (ref 135–145)

## 2022-08-12 LAB — CBC
HCT: 35.4 % — ABNORMAL LOW (ref 39.0–52.0)
Hemoglobin: 11.9 g/dL — ABNORMAL LOW (ref 13.0–17.0)
MCH: 33.3 pg (ref 26.0–34.0)
MCHC: 33.6 g/dL (ref 30.0–36.0)
MCV: 99.2 fL (ref 80.0–100.0)
Platelets: 187 10*3/uL (ref 150–400)
RBC: 3.57 MIL/uL — ABNORMAL LOW (ref 4.22–5.81)
RDW: 12.3 % (ref 11.5–15.5)
WBC: 6.4 10*3/uL (ref 4.0–10.5)
nRBC: 0 % (ref 0.0–0.2)

## 2022-08-12 LAB — CULTURE, BLOOD (ROUTINE X 2): Special Requests: ADEQUATE

## 2022-08-12 MED ORDER — CEFADROXIL 500 MG PO CAPS
1000.0000 mg | ORAL_CAPSULE | Freq: Two times a day (BID) | ORAL | Status: DC
  Administered 2022-08-12 – 2022-08-13 (×3): 1000 mg via ORAL
  Filled 2022-08-12 (×3): qty 2

## 2022-08-12 MED ORDER — CHLORHEXIDINE GLUCONATE CLOTH 2 % EX PADS
6.0000 | MEDICATED_PAD | Freq: Every day | CUTANEOUS | Status: DC
  Administered 2022-08-12 – 2022-08-13 (×2): 6 via TOPICAL

## 2022-08-12 MED ORDER — POTASSIUM CHLORIDE CRYS ER 20 MEQ PO TBCR
40.0000 meq | EXTENDED_RELEASE_TABLET | Freq: Once | ORAL | Status: AC
  Administered 2022-08-12: 40 meq via ORAL
  Filled 2022-08-12: qty 2

## 2022-08-12 MED ORDER — LOSARTAN POTASSIUM 25 MG PO TABS
25.0000 mg | ORAL_TABLET | Freq: Every day | ORAL | Status: DC
  Administered 2022-08-12 – 2022-08-13 (×2): 25 mg via ORAL
  Filled 2022-08-12 (×2): qty 1

## 2022-08-12 NOTE — Progress Notes (Signed)
Mobility Specialist - Progress Note   08/12/22 1514  Mobility  HOB Elevated/Bed Position Self regulated  Activity Ambulated with assistance in hallway  Range of Motion/Exercises Active  Level of Assistance Moderate assist, patient does 50-74%  Assistive Device Front wheel walker  Distance Ambulated (ft) 125 ft  Activity Response Tolerated well  Transport method Ambulatory  $Mobility charge 1 Mobility   Pt received in bed and agreeable to mobility.  Pt to bed after session with all needs met.     [facility]

## 2022-08-12 NOTE — Progress Notes (Signed)
PROGRESS NOTE  Tanner Griffin  DOB: 10-23-1941  PCP: Libby Maw, MD WUJ:[id number]  DOA: 08/09/2022  LOS: 3 days  Hospital Day: 4  Brief narrative:  Tanner Griffin is a 81 y.o. male with PMH significant for HTN, HLD, arthritis s/p bilateral knee replacement, prostate cancer s/p radioactive seed implant with indwelling Foley catheter, constipation, arthritis. Patient was brought to the ED from home on 8/21 with complaint of altered mental status.  EMS also noted foul-smelling urine.  No other complaint of chest pain, shortness of breath, fever, chills.  He has history of prostate cancer, has radioactive seed implants, follows up with [facility]rologist at [facility] u[facility].  He states he has a chronic indwelling Foley catheter in place, follows up with his PCP monthly for catheter exchange and states it was last changed last week. At baseline, patient is ambulatory with walker.  Lives at home with his wife and son's family.  In the ED, he had a temperature of 100.7, heart rate in 80s, blood pressure 160s, breathing on room air Patient was able to tell his name and where he is. Labs sodium 139, potassium 3.4, WC count 11.8, hemoglobin 11, MCV 101.5 Urinalysis with cloudy yellow urine with moderate amount leukocytes, positive nitrite, many bacteria Respiratory virus panel negative  Chest x-ray unremarkable CT head did not show any acute intracranial abnormality.  It showed age-related atrophy and chronic small vessel ischemia  Blood culture sent, urine culture sent patient was given IV antibiotics, IV fluid.  Foley catheter was changed. Admitted to hospitalist service for further evaluation management. Blood culture obtained on admission showed E. coli in both aerobic and anaerobic bottles, pending sensitivity Urine culture grew 60,000 CFU per mL of E. coli  Subjective: Patient was seen and examined this morning.   Lying on bed.  Not in distress.  No new symptoms.  Blood pressure  elevated to 180/99 this morning.    Assessment/Plan: Sepsis POA  E. coli bacteremia E. coli UTI UTI related to chronic indwelling Foley catheter Presented with altered mental status, foul-smelling urine On presentation had a fever of 100.7, tachypnea of 27, WBC count elevated to 11.8 Urinalysis with cloudy yellow urine with moderate amount leukocytes, positive nitrite, many bacteria Blood culture obtained on admission showed E. coli in both aerobic and anaerobic bottles, pending sensitivity Urine culture grew 60,000 CFU per mL of E. coli Currently improving on IV Rocephin.  Will discuss with pharmacy if we can switch to Keflex.  WBC count and lactic acid level normalized. Continue to monitor Recent Labs  Lab 08/09/22 2224 08/09/22 2308 08/10/22 1522 08/11/22 0545 08/12/22 0807  WBC 11.8*  --   --  7.8 6.4  LATICACIDVEN  --  0.9 1.6  --   --     Prostate cancer s/p radioactive implant in place Chronic indwelling Foley catheter Foley catheter change in the ED Continue to [facility]  Diarrhea for 2 to 3 weeks Patient states he had diarrhea at home for 2 weeks prior to presentation.  Patient states he had 4-5 loose bowel movements in last 24 hours.  Pending CT collection  Hypernatremia Improved after normal saline was stopped. Recent Labs  Lab 08/09/22 2224 08/11/22 0545 08/12/22 0807  NA 139 146* 145    Hypokalemia Potassium level low at 3.2 today.  Replacement given. Recent Labs  Lab 08/09/22 2224 08/11/22 0545 08/12/22 0807  K 3.4* 3.4* 3.2*    Essential hypertension Currently on amlodipine 10 mg daily.  Blood pressure remains elevated to 180s.  Losartan 25 mg daily added. IV hydralazine as needed  Hyperlipidemia Lipitor 40 mg daily  Arthritis S/p history of bilateral knee replacement Uses walker at baseline.  PT eval obtained.  Home with PT recommended.  Goals of care   Code Status: Full Code   Diet:  Diet Order              Diet Heart Room service appropriate? Yes; Fluid consistency: Thin  Diet effective now                  DVT prophylaxis:  enoxaparin (LOVENOX) injection 40 mg Start: 08/10/22 2200   Antimicrobials: IV ceftriaxone Fluid: None Consultants: None Family Communication: None at bed side  Status is: Inpatient  Continue in-hospital care because: History of diarrhea.  Pending C. difficile collection Level of care: Telemetry   Dispo: The patient is from: Home              Anticipated d/c is to: Home, pending clinical course              Patient currently is not medically stable to d/c.   Difficult to place patient No     Infusions:   cefTRIAXone (ROCEPHIN)  IV 200 mL/hr at 08/11/22 1900    Scheduled Meds:  amLODipine  10 mg Oral Daily   atorvastatin  40 mg Oral Daily   Chlorhexidine Gluconate Cloth  6 each Topical Daily   enoxaparin (LOVENOX) injection  40 mg Subcutaneous Q24H   lidocaine  1 Application Urethral Once   losartan  25 mg Oral Daily   senna  1 tablet Oral BID    PRN meds: acetaminophen **OR** acetaminophen, hydrALAZINE   Antimicrobials: Anti-infectives (From admission, onward)    Start     Dose/Rate Route Frequency Ordered Stop   08/10/22 1600  cefTRIAXone (ROCEPHIN) 2 g in sodium chloride 0.9 % 100 mL IVPB        2 g 200 mL/hr over 30 Minutes Intravenous Every 24 hours 08/10/22 1442     08/10/22 0800  ceFEPIme (MAXIPIME) 2 g in sodium chloride 0.9 % 100 mL IVPB  Status:  Discontinued        2 g 200 mL/hr over 30 Minutes Intravenous Every 8 hours 08/10/22 0045 08/10/22 1442   08/09/22 2245  ceFEPIme (MAXIPIME) 2 g in sodium chloride 0.9 % 100 mL IVPB        2 g 200 mL/hr over 30 Minutes Intravenous  Once 08/09/22 2243 08/10/22 0002       Objective: Vitals:   08/11/22 2158 08/12/22 0528  BP: (!) 168/80 (!) 184/99  Pulse: 78 82  Resp: 17 18  Temp: 98.9 F (37.2 C) 98 F (36.7 C)  SpO2: 100% 100%    Intake/Output Summary (Last 24 hours) at  08/12/2022 1100 Last data filed at 08/12/2022 1001 Gross per 24 hour  Intake 800 ml  Output 2175 ml  Net -1375 ml    Filed Weights   08/09/22 2200  Weight: 70.6 kg   Weight change:  Body mass index is 23.67 kg/m.   Physical Exam: General exam: Pleasant, elderly African-American male.  Not in physical distress Skin: No rashes, lesions or ulcers. HEENT: Atraumatic, normocephalic, no obvious bleeding Lungs: Clear to auscultation bilaterally CVS: Regular rate and rhythm, no murmur GI/Abd soft, nontender, nondistended, bowel sound present CNS: Alert, awake, oriented x3 Psychiatry: Mood appropriate Extremities: Improved bilateral pedal edema  Data Review: I have personally  reviewed the laboratory data and studies available.  F/u labs ordered Unresulted Labs (From admission, onward)     Start     Ordered   08/11/22 1108  C Difficile Quick Screen w PCR reflex  (C Difficile quick screen w PCR reflex panel )  Once, for 24 hours,   TIMED       References:    CDiff Information Tool   08/11/22 1107            Signed, Terrilee Croak, MD [facility] 08/12/2022

## 2022-08-12 NOTE — Care Management Important Message (Signed)
Important Message  Patient Details IM Letter given to the Patient. Name: Tanner Griffin MRN: [id number] Date of Birth: [birth year]   Medicare Important Message Given:  Yes     Kerin Salen 08/12/2022, 10:37 AM

## 2022-08-12 NOTE — TOC Progression Note (Signed)
Transition of Care [facility]) - Progression Note    Patient Details  Name: Tanner Griffin MRN: [id number] Date of Birth: August 31, 1941  [facility] CM/SW Contact  Srishti Strnad, Juliann Pulse, RN Phone Number: 08/12/2022, 11:28 AM  Clinical Narrative: [facility] rep cheryl able to provide HHPT-await d/c.      Expected Discharge Plan: [place] Barriers to Discharge: Continued Medical Work up  Expected Discharge Plan and Services Expected Discharge Plan: [place]   Discharge Planning Services: CM Consult   Living arrangements for the past 2 months: Single Family Home                           HH Arranged: PT [place]: [place] Date [place]: 08/12/22 Time [place]: 1128 Representative spoke with at [place]: [place] Determinants of Health ([place]) Interventions    Readmission Risk Interventions     No data to display

## 2022-08-12 NOTE — Progress Notes (Signed)
Mobility Specialist - Progress Note  08/12/22 1212  Mobility  HOB Elevated/Bed Position Self regulated  Activity Ambulated with assistance in hallway  Range of Motion/Exercises Active  Level of Assistance Moderate assist, patient does 50-74%  Assistive Device Front wheel walker  Distance Ambulated (ft) 90 ft  Activity Response Tolerated well  Transport method Ambulatory  $Mobility charge 1 Mobility   Pt received in bed and agreeable to mobility. C/o of tight legs due to arthritis. Pt to bed after session with all needs met.      [facility]

## 2022-08-13 ENCOUNTER — Encounter: Payer: Medicare Other | Admitting: Family Medicine

## 2022-08-13 MED ORDER — LOPERAMIDE HCL 2 MG PO CAPS: 2.0000 mg | ORAL_CAPSULE | Freq: Two times a day (BID) | ORAL | 0 refills | Status: AC | PRN

## 2022-08-13 MED ORDER — POTASSIUM CHLORIDE CRYS ER 20 MEQ PO TBCR
40.0000 meq | EXTENDED_RELEASE_TABLET | Freq: Once | ORAL | Status: AC
  Administered 2022-08-13: 40 meq via ORAL
  Filled 2022-08-13: qty 2

## 2022-08-13 MED ORDER — LOSARTAN POTASSIUM 25 MG PO TABS: 25.0000 mg | ORAL_TABLET | Freq: Every day | ORAL | 2 refills | Status: AC

## 2022-08-13 MED ORDER — RISAQUAD PO CAPS
2.0000 | ORAL_CAPSULE | Freq: Three times a day (TID) | ORAL | Status: DC
  Administered 2022-08-13: 2 via ORAL
  Filled 2022-08-13: qty 2

## 2022-08-13 MED ORDER — LOPERAMIDE HCL 2 MG PO CAPS
2.0000 mg | ORAL_CAPSULE | Freq: Two times a day (BID) | ORAL | Status: DC | PRN
Start: 2022-08-13 — End: 2022-08-13

## 2022-08-13 MED ORDER — RISAQUAD PO CAPS: 2.0000 | ORAL_CAPSULE | Freq: Three times a day (TID) | ORAL | 0 refills | Status: AC

## 2022-08-13 MED ORDER — CEFADROXIL 500 MG PO CAPS: 1000.0000 mg | ORAL_CAPSULE | Freq: Two times a day (BID) | ORAL | 0 refills | Status: AC

## 2022-08-13 NOTE — Discharge Summary (Signed)
Physician Discharge Summary  Tanner Griffin GMW:[id number] DOB: [birth year] DOA: 08/09/2022  PCP: Mliss Sax, MD  Admit date: 08/09/2022 Discharge date: 08/13/2022  Admitted From: Home Discharge disposition: Home with home health PT  Recommendations at discharge:  Cefadroxil for 5 more days to complete 10-day course. Losartan 25 mg daily has been added to your blood pressure regimen Imodium as needed for diarrhea  Brief narrative:  Tanner Griffin is a 81 y.o. male with PMH significant for HTN, HLD, arthritis s/p bilateral knee replacement, prostate cancer s/p radioactive seed implant with indwelling Foley catheter, constipation, arthritis. Patient was brought to the ED from home on 8/21 with complaint of altered mental status.  EMS also noted foul-smelling urine.  No other complaint of chest pain, shortness of breath, fever, chills.  He has history of prostate cancer, has radioactive seed implants, follows up with urologist at [facility] urology.  He states he has a chronic indwelling Foley catheter in place, follows up with his PCP monthly for catheter exchange and states it was last changed last week. At baseline, patient is ambulatory with walker.  Lives at home with his wife and son's family.  In the ED, he had a temperature of 100.7, heart rate in 80s, blood pressure 160s, breathing on room air Patient was able to tell his name and where he is. Labs sodium 139, potassium 3.4, WC count 11.8, hemoglobin 11, MCV 101.5 Urinalysis with cloudy yellow urine with moderate amount leukocytes, positive nitrite, many bacteria Respiratory virus panel negative  Chest x-ray unremarkable CT head did not show any acute intracranial abnormality.  It showed age-related atrophy and chronic small vessel ischemia  Blood culture sent, urine culture sent patient was given IV antibiotics, IV fluid.  Foley catheter was changed. Admitted to hospitalist service for further evaluation  management. Blood culture and urine culture grew E. coli.  Subjective: Patient was seen and examined this morning.   Lying on bed.  Not in distress.  No new symptoms.  Diarrhea improving.  Assessment/Plan: Sepsis POA  E. coli bacteremia E. coli UTI UTI related to chronic indwelling Foley catheter Presented with altered mental status, foul-smelling urine On presentation had a fever of 100.7, tachypnea of 27, WBC count elevated to 11.8 Urinalysis with cloudy yellow urine with moderate amount leukocytes, positive nitrite, many bacteria Blood culture and urine culture both grew E. coli.  Clinically improved with IV Rocephin.  No recurrence of fever.  WBC count improved.  8/24, patient was switched to cefadroxil 1 g twice daily.  Continue for 5 more days with probiotics to complete the course. Recent Labs  Lab 08/09/22 2224 08/09/22 2308 08/10/22 1522 08/11/22 0545 08/12/22 0807  WBC 11.8*  --   --  7.8 6.4  LATICACIDVEN  --  0.9 1.6  --   --    Prostate cancer s/p radioactive implant in place Chronic indwelling Foley catheter Foley catheter change in the ED Continue to [facility]  Diarrhea for 2 to 3 weeks Patient states he had 4-5 episodes of diarrhea per day at home for 2 weeks prior to presentation.  C. difficile assay negative.  Improving with as needed Imodium.  Continue the same at home.  Hypernatremia Improved after normal saline was stopped. Recent Labs  Lab 08/09/22 2224 08/11/22 0545 08/12/22 0807  NA 139 146* 145   Hypokalemia Potassium level low at 3.2 today.  Replacement given. Recent Labs  Lab 08/09/22 2224 08/11/22 0545 08/12/22 0807  K 3.4* 3.4*  3.2*   Essential hypertension Currently improving on amlodipine 10 mg daily and losartan 25 mg daily added.  Continue the same at home.  Hyperlipidemia Lipitor 40 mg daily  Arthritis S/p history of bilateral knee replacement Uses walker at baseline.  PT eval obtained.  Home with PT  recommended.  Wounds:  - Incision (Closed) 03/26/14 Knee Left (Active)  Date First Assessed/Time First Assessed: 03/26/14 0836   Location: Knee  Location Orientation: Left    Assessments 03/26/2014 10:30 AM 03/27/2014  8:36 AM  Dressing Type Compression wrap Compression wrap  Dressing Clean;Dry;Intact Clean;Dry;Intact     No associated orders.     Incision (Closed) 04/14/17 Abdomen Right (Active)  Date First Assessed/Time First Assessed: 04/14/17 0944   Location: Abdomen  Location Orientation: Right    Assessments 04/14/2017 10:05 AM 04/14/2017 10:57 AM  Dressing Clean;Dry;Intact Clean;Dry;Intact  Drainage Amount None None  Treatment Ice applied Ice applied     No associated orders.     Incision (Closed) 06/15/19 Perineum Other (Comment) (Active)  Date First Assessed/Time First Assessed: 06/15/19 1208   Location: Perineum  Location Orientation: Other (Comment)    Assessments 06/15/2019  1:00 PM 06/15/2019  2:36 PM  Dressing Type Gauze (Comment);Transparent dressing --  Dressing Clean;Dry;Intact --  Drainage Amount -- None     No associated orders.    Discharge Exam:   Vitals:   08/12/22 2000 08/12/22 2123 08/13/22 0441 08/13/22 1247  BP:  (!) 156/83 (!) 171/86 130/79  Pulse:  63 72 77  Resp: 13 17 16 15   Temp:  99.6 F (37.6 C) 97.9 F (36.6 C) (!) 97.3 F (36.3 C)  TempSrc:  Oral Oral Oral  SpO2:  99% 100% 100%  Weight:      Height:        Body mass index is 23.67 kg/m.   General exam: Pleasant, elderly African-American male.  Not in physical distress Skin: No rashes, lesions or ulcers. HEENT: Atraumatic, normocephalic, no obvious bleeding Lungs: Clear to auscultation bilaterally CVS: Regular rate and rhythm, no murmur GI/Abd soft, nontender, nondistended, bowel sound present CNS: Alert, awake, oriented x3 Psychiatry: Mood appropriate Extremities: Improved bilateral pedal edema  Follow ups:    Follow-up Information     [facility] Follow up.   Why: HH  physical therapy Contact information: [address] [place] [address] [phone]         Mliss Sax, MD Follow up.   Specialty: Family Medicine Contact information: [address] [place] [address] [phone]                 Discharge Instructions:   Discharge Instructions     Call MD for:  difficulty breathing, headache or visual disturbances   Complete by: As directed    Call MD for:  extreme fatigue   Complete by: As directed    Call MD for:  hives   Complete by: As directed    Call MD for:  persistant dizziness or light-headedness   Complete by: As directed    Call MD for:  persistant nausea and vomiting   Complete by: As directed    Call MD for:  severe uncontrolled pain   Complete by: As directed    Call MD for:  temperature >100.4   Complete by: As directed    Diet general   Complete by: As directed    Discharge instructions   Complete by: As directed    Recommendations at discharge:   Cefadroxil  for 5 more days to complete 10-day course.  Losartan 25 mg daily has been added to your blood pressure regimen  Imodium as needed for diarrhea  Discharge instructions for diabetes mellitus: Check blood sugar 3 times a day and bedtime at home. If blood sugar running above 200 or less than 70 please call your MD to adjust insulin. If you notice signs and symptoms of hypoglycemia (low blood sugar) like jitteriness, confusion, thirst, tremor and sweating, please check blood sugar, drink sugary drink/biscuits/sweets to increase sugar level and call MD or return to ER.    General discharge instructions: Follow with Primary MD Mliss Sax, MD in 7 days  Please request your PCP  to go over your hospital tests, procedures, radiology results at the follow up. Please get your medicines reviewed and adjusted.  Your PCP may decide to repeat certain labs or tests as needed. Do not drive, operate heavy machinery, perform  activities at heights, swimming or participation in water activities or provide baby sitting services if your were admitted for syncope or siezures until you have seen by Primary MD or a Neurologist and advised to do so again. [place] Controlled Substance Reporting System database was reviewed. Do not drive, operate heavy machinery, perform activities at heights, swim, participate in water activities or provide baby-sitting services while on medications for pain, sleep and mood until your outpatient physician has reevaluated you and advised to do so again.  You are strongly recommended to comply with the dose, frequency and duration of prescribed medications. Activity: As tolerated with Full fall precautions use walker/cane & assistance as needed Avoid using any recreational substances like cigarette, tobacco, alcohol, or non-prescribed drug. If you experience worsening of your admission symptoms, develop shortness of breath, life threatening emergency, suicidal or homicidal thoughts you must seek medical attention immediately by calling 911 or calling your MD immediately  if symptoms less severe. You must read complete instructions/literature along with all the possible adverse reactions/side effects for all the medicines you take and that have been prescribed to you. Take any new medicine only after you have completely understood and accepted all the possible adverse reactions/side effects.  Wear Seat belts while driving. You were cared for by a hospitalist during your hospital stay. If you have any questions about your discharge medications or the care you received while you were in the hospital after you are discharged, you can call the unit and ask to speak with the hospitalist or the covering physician. Once you are discharged, your primary care physician will handle any further medical issues. Please note that NO REFILLS for any discharge medications will be authorized once you are discharged,  as it is imperative that you return to your primary care physician (or establish a relationship with a primary care physician if you do not have one).   Increase activity slowly   Complete by: As directed        Discharge Medications:   Allergies as of 08/13/2022       Reactions   Telmisartan Other (See Comments)   Reaction:  Headache         Medication List     STOP taking these medications    ibuprofen 200 MG tablet Commonly known as: ADVIL   simethicone 80 MG chewable tablet Commonly known as: Gas-X       TAKE these medications    acetaminophen 500 MG tablet Commonly known as: TYLENOL Take 1,000 mg by mouth every 6 (six) hours as  needed for mild pain.   acidophilus Caps capsule Take 2 capsules by mouth 3 (three) times daily for 5 days.   alendronate 70 MG tablet Commonly known as: FOSAMAX Take 1 tablet (70 mg total) by mouth every 7 (seven) days. Take with a full glass of water on an empty stomach.   amLODipine 10 MG tablet Commonly known as: NORVASC Take 1 tablet (10 mg total) by mouth daily.   aspirin EC 81 MG tablet Take 81 mg by mouth daily. Swallow whole.   atorvastatin 40 MG tablet Commonly known as: LIPITOR Take 1 tablet (40 mg total) by mouth daily.   bismuth subsalicylate 262 MG chewable tablet Commonly known as: Pepto-Bismol Chew 2 tablets (524 mg total) by mouth as needed.   cefadroxil 500 MG capsule Commonly known as: DURICEF Take 2 capsules (1,000 mg total) by mouth 2 (two) times daily for 5 days.   cholecalciferol 25 MCG (1000 UNIT) tablet Commonly known as: VITAMIN D3 Take 1,000 Units by mouth daily.   diclofenac Sodium 1 % Gel Commonly known as: Voltaren Apply a small grape sized dollop to painful joints as needed. What changed:  how much to take how to take this when to take this reasons to take this   loperamide 2 MG capsule Commonly known as: IMODIUM Take 1 capsule (2 mg total) by mouth 2 (two) times daily as needed  for up to 5 days for diarrhea or loose stools.   losartan 25 MG tablet Commonly known as: COZAAR Take 1 tablet (25 mg total) by mouth daily. Start taking on: August 14, 2022         The results of significant diagnostics from this hospitalization (including imaging, microbiology, ancillary and laboratory) are listed below for reference.    Procedures and Diagnostic Studies:   DG Chest Port 1 View  Result Date: 08/09/2022 CLINICAL DATA:  Possible sepsis EXAM: PORTABLE CHEST 1 VIEW COMPARISON:  05/24/2019 FINDINGS: The heart size and mediastinal contours are within normal limits. Both lungs are clear. The visualized skeletal structures are unremarkable. IMPRESSION: No active disease. Electronically Signed   By: Jasmine Pang M.D.   On: 08/09/2022 23:13   CT Head Wo Contrast  Result Date: 08/09/2022 CLINICAL DATA:  Mental status change, unknown cause EXAM: CT HEAD WITHOUT CONTRAST TECHNIQUE: Contiguous axial images were obtained from the base of the skull through the vertex without intravenous contrast. RADIATION DOSE REDUCTION: This exam was performed according to the departmental dose-optimization program which includes automated exposure control, adjustment of the mA and/or kV according to patient size and/or use of iterative reconstruction technique. COMPARISON:  None Available. FINDINGS: Brain: No intracranial hemorrhage, mass effect, or midline shift. Age related atrophy. No hydrocephalus. The basilar cisterns are patent. Mild periventricular and deep white matter hypodensity typical of chronic small vessel ischemia. No evidence of territorial infarct or acute ischemia. No extra-axial or intracranial fluid collection. Vascular: Atherosclerosis of skullbase vasculature without hyperdense vessel or abnormal calcification. Skull: No fracture or focal lesion. Sinuses/Orbits: Paranasal sinuses and mastoid air cells are clear. The visualized orbits are unremarkable. Other: None. IMPRESSION: 1. No  acute intracranial abnormality. 2. Age related atrophy and chronic small vessel ischemia. Electronically Signed   By: Narda Rutherford M.D.   On: 08/09/2022 22:57     Labs:   Basic Metabolic Panel: Recent Labs  Lab 08/09/22 2224 08/11/22 0545 08/12/22 0807  NA 139 146* 145  K 3.4* 3.4* 3.2*  CL 110 114* 113*  CO2 25 25 27  GLUCOSE 111* 95 93  BUN 17 12 13   CREATININE 0.85 0.81 0.76  CALCIUM 8.9 9.1 8.8*   GFR Estimated Creatinine Clearance: 72.4 mL/min (by C-G formula based on SCr of 0.76 mg/dL). Liver Function Tests: Recent Labs  Lab 08/09/22 2308  AST 23  ALT 18  ALKPHOS 133*  BILITOT 1.3*  PROT 6.7  ALBUMIN 3.6   No results for input(s): "LIPASE", "AMYLASE" in the last 168 hours. No results for input(s): "AMMONIA" in the last 168 hours. Coagulation profile Recent Labs  Lab 08/09/22 2308  INR 1.1    CBC: Recent Labs  Lab 08/09/22 2224 08/11/22 0545 08/12/22 0807  WBC 11.8* 7.8 6.4  NEUTROABS 10.1*  --   --   HGB 11.0* 12.0* 11.9*  HCT 33.6* 36.5* 35.4*  MCV 101.5* 101.1* 99.2  PLT 183 179 187   Cardiac Enzymes: No results for input(s): "CKTOTAL", "CKMB", "CKMBINDEX", "TROPONINI" in the last 168 hours. BNP: Invalid input(s): "POCBNP" CBG: No results for input(s): "GLUCAP" in the last 168 hours. D-Dimer No results for input(s): "DDIMER" in the last 72 hours. Hgb A1c No results for input(s): "HGBA1C" in the last 72 hours. Lipid Profile No results for input(s): "CHOL", "HDL", "LDLCALC", "TRIG", "CHOLHDL", "LDLDIRECT" in the last 72 hours. Thyroid function studies No results for input(s): "TSH", "T4TOTAL", "T3FREE", "THYROIDAB" in the last 72 hours.  Invalid input(s): "FREET3" Anemia work up No results for input(s): "VITAMINB12", "FOLATE", "FERRITIN", "TIBC", "IRON", "RETICCTPCT" in the last 72 hours. Microbiology Recent Results (from the past 240 hour(s))  Urine Culture     Status: Abnormal   Collection Time: 08/09/22 10:13 PM   Specimen:  Urine, Clean Catch  Result Value Ref Range Status   Specimen Description   Final    URINE, CLEAN CATCH Performed at [facility], [address], [place], [place] [address]    Special Requests   Final    NONE Performed at [facility], [address], [place], [place] [address]    Culture 60,000 COLONIES/mL ESCHERICHIA COLI (A)  Final   Report Status 08/11/2022 FINAL  Final   Organism ID, Bacteria ESCHERICHIA COLI (A)  Final      Susceptibility   Escherichia coli - MIC*    AMPICILLIN >=32 RESISTANT Resistant     CEFAZOLIN <=4 SENSITIVE Sensitive     CEFEPIME <=0.12 SENSITIVE Sensitive     CEFTRIAXONE <=0.25 SENSITIVE Sensitive     CIPROFLOXACIN <=0.25 SENSITIVE Sensitive     GENTAMICIN <=1 SENSITIVE Sensitive     IMIPENEM <=0.25 SENSITIVE Sensitive     NITROFURANTOIN <=16 SENSITIVE Sensitive     TRIMETH/SULFA <=20 SENSITIVE Sensitive     AMPICILLIN/SULBACTAM 8 SENSITIVE Sensitive     PIP/TAZO <=4 SENSITIVE Sensitive     * 60,000 COLONIES/mL ESCHERICHIA COLI  Blood Culture (routine x 2)     Status: Abnormal   Collection Time: 08/09/22 11:08 PM   Specimen: BLOOD  Result Value Ref Range Status   Specimen Description   Final    BLOOD BLOOD LEFT FOREARM Performed at [facility], [address], [place], [place] [address]    Special Requests   Final    BOTTLES DRAWN AEROBIC AND ANAEROBIC Blood Culture adequate volume Performed at [facility], [address] [place], [place] [address]    Culture  Setup Time   Final    GRAM NEGATIVE RODS IN BOTH AEROBIC AND ANAEROBIC BOTTLES CRITICAL RESULT CALLED TO, READ BACK BY AND VERIFIED  WITH: PHARMD J.GADHIA AT 1425 ON 08/10/2022 BY T.SAAD. Performed at [facility], [address], [place], [place] [address]    Culture ESCHERICHIA COLI (A)  Final   Report Status 08/12/2022 FINAL  Final   Organism ID, Bacteria ESCHERICHIA COLI  Final      Susceptibility    Escherichia coli - MIC*    AMPICILLIN >=32 RESISTANT Resistant     CEFAZOLIN <=4 SENSITIVE Sensitive     CEFEPIME <=0.12 SENSITIVE Sensitive     CEFTAZIDIME <=1 SENSITIVE Sensitive     CEFTRIAXONE <=0.25 SENSITIVE Sensitive     CIPROFLOXACIN <=0.25 SENSITIVE Sensitive     GENTAMICIN <=1 SENSITIVE Sensitive     IMIPENEM <=0.25 SENSITIVE Sensitive     TRIMETH/SULFA <=20 SENSITIVE Sensitive     AMPICILLIN/SULBACTAM 8 SENSITIVE Sensitive     PIP/TAZO <=4 SENSITIVE Sensitive     * ESCHERICHIA COLI  Blood Culture ID Panel (Reflexed)     Status: Abnormal   Collection Time: 08/09/22 11:08 PM  Result Value Ref Range Status   Enterococcus faecalis NOT DETECTED NOT DETECTED Final   Enterococcus Faecium NOT DETECTED NOT DETECTED Final   Listeria monocytogenes NOT DETECTED NOT DETECTED Final   Staphylococcus species NOT DETECTED NOT DETECTED Final   Staphylococcus aureus (BCID) NOT DETECTED NOT DETECTED Final   Staphylococcus epidermidis NOT DETECTED NOT DETECTED Final   Staphylococcus lugdunensis NOT DETECTED NOT DETECTED Final   Streptococcus species NOT DETECTED NOT DETECTED Final   Streptococcus agalactiae NOT DETECTED NOT DETECTED Final   Streptococcus pneumoniae NOT DETECTED NOT DETECTED Final   Streptococcus pyogenes NOT DETECTED NOT DETECTED Final   A.calcoaceticus-baumannii NOT DETECTED NOT DETECTED Final   Bacteroides fragilis NOT DETECTED NOT DETECTED Final   Enterobacterales DETECTED (A) NOT DETECTED Final    Comment: Enterobacterales represent a large order of gram negative bacteria, not a single organism. CRITICAL RESULT CALLED TO, READ BACK BY AND VERIFIED WITH: PHARMD J.GADHIA AT 1425 ON 08/10/2022 BY T.SAAD.    Enterobacter cloacae complex NOT DETECTED NOT DETECTED Final   Escherichia coli DETECTED (A) NOT DETECTED Final    Comment: CRITICAL RESULT CALLED TO, READ BACK BY AND VERIFIED WITH: PHARMD J.GADHIA AT 1425 ON 08/10/2022 BY T.SAAD.    Klebsiella aerogenes NOT  DETECTED NOT DETECTED Final   Klebsiella oxytoca NOT DETECTED NOT DETECTED Final   Klebsiella pneumoniae NOT DETECTED NOT DETECTED Final   Proteus species NOT DETECTED NOT DETECTED Final   Salmonella species NOT DETECTED NOT DETECTED Final   Serratia marcescens NOT DETECTED NOT DETECTED Final   Haemophilus influenzae NOT DETECTED NOT DETECTED Final   Neisseria meningitidis NOT DETECTED NOT DETECTED Final   Pseudomonas aeruginosa NOT DETECTED NOT DETECTED Final   Stenotrophomonas maltophilia NOT DETECTED NOT DETECTED Final   Candida albicans NOT DETECTED NOT DETECTED Final   Candida auris NOT DETECTED NOT DETECTED Final   Candida glabrata NOT DETECTED NOT DETECTED Final   Candida krusei NOT DETECTED NOT DETECTED Final   Candida parapsilosis NOT DETECTED NOT DETECTED Final   Candida tropicalis NOT DETECTED NOT DETECTED Final   Cryptococcus neoformans/gattii NOT DETECTED NOT DETECTED Final   CTX-M ESBL NOT DETECTED NOT DETECTED Final   Carbapenem resistance IMP NOT DETECTED NOT DETECTED Final   Carbapenem resistance KPC NOT DETECTED NOT DETECTED Final   Carbapenem resistance NDM NOT DETECTED NOT DETECTED Final   Carbapenem resist OXA 48 LIKE NOT DETECTED NOT DETECTED Final   Carbapenem resistance VIM NOT DETECTED NOT DETECTED Final  Comment: Performed at [facility], [address] [place], [place] [address]  Blood Culture (routine x 2)     Status: Abnormal   Collection Time: 08/09/22 11:12 PM   Specimen: BLOOD  Result Value Ref Range Status   Specimen Description   Final    BLOOD BLOOD LEFT HAND Performed at [facility], [address] [place], [place] [address]    Special Requests   Final    BOTTLES DRAWN AEROBIC AND ANAEROBIC Blood Culture results may not be optimal due to an inadequate volume of blood received in culture bottles Performed at [facility], [address] [place], [place] [address]    Culture  Setup Time   Final     GRAM NEGATIVE RODS AEROBIC BOTTLE ONLY CRITICAL VALUE NOTED.  VALUE IS CONSISTENT WITH PREVIOUSLY REPORTED AND CALLED VALUE.    Culture (A)  Final    ESCHERICHIA COLI SUSCEPTIBILITIES PERFORMED ON PREVIOUS CULTURE WITHIN THE LAST 5 DAYS. Performed at [facility], [address] [place], [place] [address]    Report Status 08/12/2022 FINAL  Final  Resp Panel by RT-PCR (Flu A&B, Covid)     Status: None   Collection Time: 08/09/22 11:19 PM   Specimen: Nasal Swab  Result Value Ref Range Status   SARS Coronavirus 2 by RT PCR NEGATIVE NEGATIVE Final    Comment: (NOTE) SARS-CoV-2 target nucleic acids are NOT DETECTED.  The SARS-CoV-2 RNA is generally detectable in upper respiratory specimens during the acute phase of infection. The lowest concentration of SARS-CoV-2 viral copies this assay can detect is 138 copies/mL. A negative result does not preclude SARS-Cov-2 infection and should not be used as the sole basis for treatment or other patient management decisions. A negative result may occur with  improper specimen collection/handling, submission of specimen other than nasopharyngeal swab, presence of viral mutation(s) within the areas targeted by this assay, and inadequate number of viral copies(<138 copies/mL). A negative result must be combined with clinical observations, patient history, and epidemiological information. The expected result is Negative.  Fact Sheet for Patients:  BloggerCourse.com  Fact Sheet for Healthcare Providers:  SeriousBroker.it  This test is no t yet approved or cleared by the [place] FDA and  has been authorized for detection and/or diagnosis of SARS-CoV-2 by FDA under an Emergency Use Authorization (EUA). This EUA will remain  in effect (meaning this test can be used) for the duration of the COVID-19 declaration under Section 564(b)(1) of the Act, 21 U.S.C.section 360bbb-3(b)(1), unless  the authorization is terminated  or revoked sooner.       Influenza A by PCR NEGATIVE NEGATIVE Final   Influenza B by PCR NEGATIVE NEGATIVE Final    Comment: (NOTE) The Xpert Xpress SARS-CoV-2/FLU/RSV plus assay is intended as an aid in the diagnosis of influenza from Nasopharyngeal swab specimens and should not be used as a sole basis for treatment. Nasal washings and aspirates are unacceptable for Xpert Xpress SARS-CoV-2/FLU/RSV testing.  Fact Sheet for Patients: BloggerCourse.com  Fact Sheet for Healthcare Providers: SeriousBroker.it  This test is not yet approved or cleared by the [place] FDA and has been authorized for detection and/or diagnosis of SARS-CoV-2 by FDA under an Emergency Use Authorization (EUA). This EUA will remain in effect (meaning this test can be used) for the duration of the COVID-19 declaration under Section 564(b)(1) of the Act, 21 U.S.C. section 360bbb-3(b)(1), unless the authorization is terminated or revoked.  Performed at [facility]  [employer], [address] [place], [place] [address]   C Difficile Quick Screen w PCR reflex     Status: None   Collection Time: 08/11/22  9:36 AM   Specimen: STOOL  Result Value Ref Range Status   C Diff antigen NEGATIVE NEGATIVE Final   C Diff toxin NEGATIVE NEGATIVE Final   C Diff interpretation No C. difficile detected.  Final    Comment: Performed at [facility], [name], [place], [place] [address]    Time coordinating discharge: 35 minutes  Signed: Melina Schools Yaretsi Humphres  [facility] 08/13/2022, 12:56 PM

## 2022-08-13 NOTE — Progress Notes (Signed)
Mobility Specialist Cancellation/Refusal Note:    08/13/22 1215  Mobility  Activity Refused mobility     Reason for Cancellation/Refusal: Pt declined mobility at this time. Pt just walked with PT & getting ready to head home. Will check back as schedule permits.      [facility]

## 2022-08-13 NOTE — Progress Notes (Signed)
Pt given and explained discharge instructions. IV removed. Tele removed. Pt dressed in personal clothing. Pt given extra foley catheter supplies for care at home. Pt taken to the main entrance via wheelchair.

## 2022-08-13 NOTE — Progress Notes (Signed)
Physical Therapy Treatment Patient Details Name: Tanner Griffin MRN: [id number] DOB: 06/27/1941 Today's Date: 08/13/2022   History of Present Illness 81 yo male admitted with UTI, AMS. Hx of bil TKAs, prostate ca,    PT Comments    Pt is making steady progress with mobility and  spouse present today to discuss discharge plan. Pt required min assist for transfers and gait with RW. PT tolerated increased distance with standing rest break half way to conserve energy. Reviewed seated exercises for pt to perform at home and encouraged to progress with HHPT. Will continue to progress as able in acute setting.    Recommendations for follow up therapy are one component of a multi-disciplinary discharge planning process, led by the attending physician.  Recommendations may be updated based on patient status, additional functional criteria and insurance authorization.  Follow Up Recommendations  Home health PT [facility] aide if possible)     Assistance Recommended at Discharge Frequent or constant Supervision/Assistance  Patient can return home with the following A little help with walking and/or transfers;A little help with bathing/dressing/bathroom;Assistance with cooking/housework;Assist for transportation;Help with stairs or ramp for entrance   Equipment Recommendations  None recommended by PT    Recommendations for Other Services       Precautions / Restrictions Precautions Precautions: Fall Precaution Comments: incontinent of BM Restrictions Weight Bearing Restrictions: No     Mobility  Bed Mobility Overal bed mobility: Needs Assistance Bed Mobility: Supine to Sit     Supine to sit: Min assist, HOB elevated     General bed mobility comments: Assist to sequence bringing LE's off EOB and raise trunk    Transfers Overall transfer level: Needs assistance Equipment used: Rolling walker (2 wheels) Transfers: Sit to/from Stand Sit to Stand: Min assist, +2 safety/equipment            General transfer comment: MIn assist to power up from EOB and recliner to power up. cues to power up with bil UE from seat and assist to steady with hand transition to RW.    Ambulation/Gait Ambulation/Gait assistance: Min assist Gait Distance (Feet): 230 Feet Assistive device: Rolling walker (2 wheels) Gait Pattern/deviations: Step-through pattern, Decreased stride length, Trunk flexed Gait velocity: decr     General Gait Details: cues for proximity to RW, assist to steady and prevent LOB. pt denied SOB or fatigue, short standing break at halfway.   Stairs             Wheelchair Mobility    Modified Rankin (Stroke Patients Only)       Balance Overall balance assessment: Needs assistance Sitting-balance support: Bilateral upper extremity supported, Feet supported Sitting balance-Leahy Scale: Good     Standing balance support: Bilateral upper extremity supported, During functional activity, Reliant on assistive device for balance Standing balance-Leahy Scale: Poor                              Cognition Arousal/Alertness: Awake/alert Behavior During Therapy: WFL for tasks assessed/performed Overall Cognitive Status: Within Functional Limits for tasks assessed                                          Exercises General Exercises - Lower Extremity Long Arc Quad: AROM, Both, 10 reps, Seated Hip ABduction/ADduction: AROM, Both, 10 reps, Seated Hip Flexion/Marching: AROM, Both, 10 reps, Seated  General Comments        Pertinent Vitals/Pain Pain Assessment Pain Assessment: No/denies pain    Home Living Family/patient expects to be discharged to:: Private residence Living Arrangements: Spouse/significant other;Children;Other relatives Available Help at Discharge: Family;Available 24 hours/day Type of Home: House Home Access: Stairs to enter   [employer] of Steps: 1   Home Layout: One level Home Equipment:  Rollator (4 wheels);Rolling Walker (2 wheels);BSC/3in1;Tub bench      Prior Function            PT Goals (current goals can now be found in the care plan section) Acute Rehab PT Goals Patient Stated Goal: home soon PT Goal Formulation: With patient/family Time For Goal Achievement: 08/24/22 Potential to Achieve Goals: Good Progress towards PT goals: Progressing toward goals    Frequency    Min 3X/week      PT Plan Current plan remains appropriate    Co-evaluation              AM-PAC PT "6 Clicks" Mobility   Outcome Measure  Help needed turning from your back to your side while in a flat bed without using bedrails?: A Little Help needed moving from lying on your back to sitting on the side of a flat bed without using bedrails?: A Little Help needed moving to and from a bed to a chair (including a wheelchair)?: A Little Help needed standing up from a chair using your arms (e.g., wheelchair or bedside chair)?: A Little Help needed to walk in hospital room?: A Little Help needed climbing 3-5 steps with a railing? : A Lot 6 Click Score: 17    End of Session Equipment Utilized During Treatment: Gait belt Activity Tolerance: Patient tolerated treatment well Patient left: in chair;with call bell/phone within reach;with chair alarm set;with family/visitor present Nurse Communication: Mobility status PT Visit Diagnosis: Muscle weakness (generalized) (M62.81);Difficulty in walking, not elsewhere classified (R26.2)     Time: 1120-1150 PT Time Calculation (min) (ACUTE ONLY): 30 min  Charges:  $Gait Training: 8-22 mins $Therapeutic Exercise: 8-22 mins                     Verner Mould, DPT Acute Rehabilitation Services Office [phone] Pager [phone]  08/13/22 3:13 PM

## 2022-08-19 ENCOUNTER — Telehealth: Payer: Self-pay

## 2022-08-19 ENCOUNTER — Ambulatory Visit: Payer: Medicare Other | Admitting: Family Medicine

## 2022-08-19 NOTE — Progress Notes (Signed)
Per Clinical pharmacist, reach out to patient and assist with getting him a TOC appointment with a PCP.  Per patient spouse, he is no longer a patient at [facility], and has transfer his care to another office. Patient spouse states  name  of his new PCP office has the word [facility] in it.Patient spouse reports he is doing/feeling better and has had a hospital follow up already with his new PCP. Notified Clinical pharmacist.  Anderson Malta Clinical Pharmacist Assistant [phone]

## 2022-08-25 ENCOUNTER — Telehealth: Payer: Self-pay

## 2022-08-25 NOTE — Telephone Encounter (Signed)
Tammie [phone] calling from [facility] service giving report on swelling in patients feet states that patient reports that the swelling have been present for the past couple of day. Called patient to schedule appointment for evaluation on swelling. Per patients wife patient is no longer a patient at this office but she will call his new doctor to schedule an appointment.

## 2022-08-27 ENCOUNTER — Telehealth: Payer: Self-pay | Admitting: Family Medicine

## 2022-08-27 NOTE — Telephone Encounter (Signed)
[place] Name: [place] Agency Name: [facility] Laud Phone #: [phone]   Requesting authorization to accept orders from Dr. Dorian Pod at [facility] in addition to Dr. Ethelene Hal.

## 2022-08-27 NOTE — Telephone Encounter (Signed)
Patient transferring to Dr. Juliane Poot calling from [facility] for approval from Dr. Ethelene Hal to give Dr. Dorian Pod authorization to approve patients orders from now on? Okay for approval. Please advise

## 2022-08-30 NOTE — Telephone Encounter (Signed)
Called Mickel Baas to give authorization no answer LM on identified VM

## 2022-10-30 ENCOUNTER — Other Ambulatory Visit: Payer: Self-pay

## 2022-10-30 ENCOUNTER — Emergency Department (HOSPITAL_COMMUNITY)
Admission: EM | Admit: 2022-10-30 | Discharge: 2022-10-30 | Disposition: A | Discharge status: Home / Self Care | Payer: Medicare Other | Attending: Emergency Medicine | Admitting: Emergency Medicine

## 2022-10-30 ENCOUNTER — Encounter (HOSPITAL_COMMUNITY): Payer: Self-pay

## 2022-10-30 DIAGNOSIS — Z7982 Long term (current) use of aspirin: Secondary | ICD-10-CM | POA: Insufficient documentation

## 2022-10-30 DIAGNOSIS — Z8546 Personal history of malignant neoplasm of prostate: Secondary | ICD-10-CM | POA: Diagnosis not present

## 2022-10-30 DIAGNOSIS — T839XXA Unspecified complication of genitourinary prosthetic device, implant and graft, initial encounter: Secondary | ICD-10-CM

## 2022-10-30 DIAGNOSIS — T83091A Other mechanical complication of indwelling urethral catheter, initial encounter: Secondary | ICD-10-CM | POA: Insufficient documentation

## 2022-10-30 DIAGNOSIS — I10 Essential (primary) hypertension: Secondary | ICD-10-CM

## 2022-10-30 DIAGNOSIS — Y732 Prosthetic and other implants, materials and accessory gastroenterology and urology devices associated with adverse incidents: Secondary | ICD-10-CM | POA: Insufficient documentation

## 2022-10-30 DIAGNOSIS — Z79899 Other long term (current) drug therapy: Secondary | ICD-10-CM | POA: Insufficient documentation

## 2022-10-30 DIAGNOSIS — N390 Urinary tract infection, site not specified: Secondary | ICD-10-CM

## 2022-10-30 LAB — URINALYSIS, ROUTINE W REFLEX MICROSCOPIC
Bilirubin Urine: NEGATIVE
Glucose, UA: NEGATIVE mg/dL
Hgb urine dipstick: NEGATIVE
Ketones, ur: NEGATIVE mg/dL
Nitrite: NEGATIVE
Protein, ur: NEGATIVE mg/dL
Specific Gravity, Urine: 1.013 (ref 1.005–1.030)
WBC, UA: >50 WBC/hpf — ABNORMAL HIGH (ref 0–5)
pH: 7 (ref 5.0–8.0)

## 2022-10-30 LAB — CBC WITH DIFFERENTIAL/PLATELET
Abs Immature Granulocytes: 0 10*3/uL (ref 0.00–0.07)
Basophils Absolute: 0 10*3/uL (ref 0.0–0.1)
Basophils Relative: 1 %
Eosinophils Absolute: 0.1 10*3/uL (ref 0.0–0.5)
Eosinophils Relative: 3 %
HCT: 40.2 % (ref 39.0–52.0)
Hemoglobin: 12.7 g/dL — ABNORMAL LOW (ref 13.0–17.0)
Immature Granulocytes: 0 %
Lymphocytes Relative: 40 %
Lymphs Abs: 2 10*3/uL (ref 0.7–4.0)
MCH: 32.3 pg (ref 26.0–34.0)
MCHC: 31.6 g/dL (ref 30.0–36.0)
MCV: 102.3 fL — ABNORMAL HIGH (ref 80.0–100.0)
Monocytes Absolute: 0.5 10*3/uL (ref 0.1–1.0)
Monocytes Relative: 10 %
Neutro Abs: 2.3 10*3/uL (ref 1.7–7.7)
Neutrophils Relative %: 46 %
Platelets: 247 10*3/uL (ref 150–400)
RBC: 3.93 MIL/uL — ABNORMAL LOW (ref 4.22–5.81)
RDW: 12.1 % (ref 11.5–15.5)
WBC: 5 10*3/uL (ref 4.0–10.5)
nRBC: 0 % (ref 0.0–0.2)

## 2022-10-30 LAB — BASIC METABOLIC PANEL
Anion gap: 4 — ABNORMAL LOW (ref 5–15)
BUN: 28 mg/dL — ABNORMAL HIGH (ref 8–23)
CO2: 32 mmol/L (ref 22–32)
Calcium: 9.2 mg/dL (ref 8.9–10.3)
Chloride: 106 mmol/L (ref 98–111)
Creatinine, Ser: 1.12 mg/dL (ref 0.61–1.24)
GFR, Estimated: >60 mL/min (ref >60)
Glucose, Bld: 73 mg/dL (ref 70–99)
Potassium: 4.1 mmol/L (ref 3.5–5.1)
Sodium: 142 mmol/L (ref 135–145)

## 2022-10-30 MED ORDER — SULFAMETHOXAZOLE-TRIMETHOPRIM 800-160 MG PO TABS
1.0000 | ORAL_TABLET | Freq: Once | ORAL | Status: AC
  Administered 2022-10-30: 1 via ORAL
  Filled 2022-10-30: qty 1

## 2022-10-30 MED ORDER — SULFAMETHOXAZOLE-TRIMETHOPRIM 800-160 MG PO TABS: 1.0000 | ORAL_TABLET | Freq: Two times a day (BID) | ORAL | 0 refills | Status: AC

## 2022-10-30 MED ORDER — CLONIDINE HCL 0.1 MG PO TABS
0.1000 mg | ORAL_TABLET | Freq: Once | ORAL | Status: AC
  Administered 2022-10-30: 0.1 mg via ORAL
  Filled 2022-10-30: qty 1

## 2022-10-30 MED ORDER — AMLODIPINE BESYLATE 5 MG PO TABS
10.0000 mg | ORAL_TABLET | Freq: Once | ORAL | Status: AC
  Administered 2022-10-30: 10 mg via ORAL
  Filled 2022-10-30: qty 2

## 2022-10-30 NOTE — ED Notes (Signed)
Pt in bed, sig other at bedside, pt has been leaking around foley cath, pt leg bag is empty, d/c foley cath, and inserted new foley caoth, per md instructions, pt tolerated well, pt has clear dilute urine out, ashley tech at bedside to observe for sterile technique.  Pt reports improvement after foley placement.

## 2022-10-30 NOTE — Discharge Instructions (Signed)
Here catheter was replaced.  Your urinalysis showed possible urinary tract infection.  Please take Bactrim twice a day for a week  Your blood pressure was elevated I gave you your home dose of blood pressure medicine.  Please recheck with your doctor in the week  Follow-up with urology for catheter replacement as well as primary care doctor  Return to ER if the Foley is not draining, severe pelvic pain, abdominal pain, fever

## 2022-10-30 NOTE — ED Triage Notes (Signed)
Pt to er via ems, per ems pt was sent in because home health couldn't help him, states that he is here because he is leaking around his foley cath, states that it was last changed on Wednesday, states that he has had the foley for the past 8 months.

## 2022-10-30 NOTE — ED Provider Notes (Signed)
[place] DEPT Provider Note   CSN: [id number] Arrival date & time: 10/30/22  1504     History  No chief complaint on file.   Tanner Griffin is a 81 y.o. male history of chronic Foley catheter after prostate cancer, here presenting with Foley clotted.  Patient states that his Foley was placed about 3 days ago.  He states that it stopped draining last night.  The home health was trying to put another Foley but was unsuccessful.  Patient was sent here for Foley replacement.  Patient has bladder pain.  Denies any fevers.  The history is provided by the patient.       Home Medications Prior to Admission medications   Medication Sig Start Date End Date Taking? Authorizing Provider  acetaminophen (TYLENOL) 500 MG tablet Take 1,000 mg by mouth every 6 (six) hours as needed for mild pain.    [provider]  alendronate (FOSAMAX) 70 MG tablet Take 1 tablet (70 mg total) by mouth every 7 (seven) days. Take with a full glass of water on an empty stomach. 05/19/22   Libby Maw, MD  amLODipine (NORVASC) 10 MG tablet Take 1 tablet (10 mg total) by mouth daily. 05/19/22   Libby Maw, MD  aspirin EC 81 MG tablet Take 81 mg by mouth daily. Swallow whole.    [provider]  atorvastatin (LIPITOR) 40 MG tablet Take 1 tablet (40 mg total) by mouth daily. 05/19/22   Libby Maw, MD  bismuth subsalicylate (PEPTO-BISMOL) 262 MG chewable tablet Chew 2 tablets (524 mg total) by mouth as needed. Patient not taking: Reported on 08/11/2022 07/05/22   Libby Maw, MD  cholecalciferol (VITAMIN D3) 25 MCG (1000 UNIT) tablet Take 1,000 Units by mouth daily.    [provider]  diclofenac Sodium (VOLTAREN) 1 % GEL Apply a small grape sized dollop to painful joints as needed. Patient taking differently: Apply 2 g topically daily as needed (pain). Apply a small grape sized dollop to painful joints as needed. 07/05/22    Libby Maw, MD  losartan (COZAAR) 25 MG tablet Take 1 tablet (25 mg total) by mouth daily. 08/14/22 11/12/22  Terrilee Croak, MD      Allergies    Telmisartan    Review of Systems   Review of Systems  Genitourinary:  Positive for penile pain.  All other systems reviewed and are negative.   Physical Exam Updated Vital Signs BP (!) 200/89 (BP Location: Left Arm)   Pulse (!) 56   Temp 98.1 F (36.7 C) (Oral)   Resp 18   Ht 5' 7.5" (1.715 m)   Wt 72.6 kg   SpO2 100%   BMI 24.69 kg/m  Physical Exam Vitals and nursing note reviewed.  Constitutional:      Comments: Chronically ill-appearing  HENT:     Head: Normocephalic.     Mouth/Throat:     Mouth: Mucous membranes are dry.  Eyes:     Pupils: Pupils are equal, round, and reactive to light.  Cardiovascular:     Rate and Rhythm: Normal rate and regular rhythm.  Pulmonary:     Effort: Pulmonary effort is normal.     Breath sounds: Normal breath sounds.  Abdominal:     General: Abdomen is flat.     Palpations: Abdomen is soft.  Genitourinary:    Comments: Foley catheter with no drainage inside the bag but he has some drainage around the catheter Musculoskeletal:  Cervical back: Normal range of motion and neck supple.  Skin:    General: Skin is warm.     Capillary Refill: Capillary refill takes less than 2 seconds.  Neurological:     General: No focal deficit present.     Mental Status: He is alert and oriented to person, place, and time.  Psychiatric:        Mood and Affect: Mood normal.     ED Results / Procedures / Treatments   Labs (all labs ordered are listed, but only abnormal results are displayed) Labs Reviewed  CBC WITH DIFFERENTIAL/PLATELET - Abnormal; Notable for the following components:      Result Value   RBC 3.93 (*)    Hemoglobin 12.7 (*)    MCV 102.3 (*)    All other components within normal limits  BASIC METABOLIC PANEL - Abnormal; Notable for the following components:   BUN  28 (*)    Anion gap 4 (*)    All other components within normal limits  URINALYSIS, ROUTINE W REFLEX MICROSCOPIC - Abnormal; Notable for the following components:   Leukocytes,Ua MODERATE (*)    WBC, UA >50 (*)    Bacteria, UA RARE (*)    All other components within normal limits  URINE CULTURE    EKG None  Radiology No results found.  Procedures Procedures    Medications Ordered in ED Medications  sulfamethoxazole-trimethoprim (BACTRIM DS) 800-160 MG per tablet 1 tablet (has no administration in time range)  cloNIDine (CATAPRES) tablet 0.1 mg (has no administration in time range)  amLODipine (NORVASC) tablet 10 mg (has no administration in time range)    ED Course/ Medical Decision Making/ A&P                           Medical Decision Making Tanner Griffin is a 81 y.o. male here presenting with Foley issue.  The catheter is not draining and is drainage around it.  We will have nurse replaced the catheter and check CBC and BMP and UA urine culture  4:45 PM CBC and BMP unremarkable.  New Foley was placed and about 200 cc of urine.  UA showed greater than 50 white blood cell count.  Patient is not on chronic antibiotics.  Previous urine culture sensitive to Bactrim.  We will place patient on Bactrim.  Patient is hypertensive initially and has a history of hypertension.  Given his home BP meds.  Stable for discharge back home.  Told him that he will need Foley replacement in a month.   Problems Addressed: Complication of Foley catheter, initial encounter [facility]): acute illness or injury Hypertension, unspecified type: chronic illness or injury with exacerbation, progression, or side effects of treatment Urinary tract infection with hematuria, site unspecified: acute illness or injury  Amount and/or Complexity of Data Reviewed Labs: ordered. Decision-making details documented in ED Course.  Risk Prescription drug management.    Final Clinical Impression(s) / ED  Diagnoses Final diagnoses:  None    Rx / DC Orders ED Discharge Orders     None         Drenda Freeze, MD 10/30/22 1646

## 2022-11-01 LAB — URINE CULTURE

## 2022-11-18 ENCOUNTER — Encounter: Payer: Self-pay | Admitting: Internal Medicine

## 2023-01-25 ENCOUNTER — Other Ambulatory Visit: Payer: Self-pay

## 2023-01-25 ENCOUNTER — Emergency Department (HOSPITAL_COMMUNITY): Payer: Medicare Other

## 2023-01-25 ENCOUNTER — Emergency Department (HOSPITAL_COMMUNITY)
Admission: EM | Admit: 2023-01-25 | Discharge: 2023-01-25 | Disposition: A | Discharge status: Home / Self Care | Payer: Medicare Other | Attending: Emergency Medicine | Admitting: Emergency Medicine

## 2023-01-25 DIAGNOSIS — Z7982 Long term (current) use of aspirin: Secondary | ICD-10-CM | POA: Diagnosis not present

## 2023-01-25 DIAGNOSIS — M889 Osteitis deformans of unspecified bone: Secondary | ICD-10-CM

## 2023-01-25 DIAGNOSIS — Z79899 Other long term (current) drug therapy: Secondary | ICD-10-CM | POA: Insufficient documentation

## 2023-01-25 DIAGNOSIS — M8888 Osteitis deformans of other bones: Secondary | ICD-10-CM | POA: Diagnosis not present

## 2023-01-25 DIAGNOSIS — E119 Type 2 diabetes mellitus without complications: Secondary | ICD-10-CM | POA: Insufficient documentation

## 2023-01-25 DIAGNOSIS — R109 Unspecified abdominal pain: Secondary | ICD-10-CM

## 2023-01-25 LAB — URINALYSIS, ROUTINE W REFLEX MICROSCOPIC
Bilirubin Urine: NEGATIVE
Glucose, UA: NEGATIVE mg/dL
Ketones, ur: NEGATIVE mg/dL
Nitrite: NEGATIVE
Protein, ur: 100 mg/dL — AB
RBC / HPF: >50 RBC/hpf (ref 0–5)
Specific Gravity, Urine: 1.014 (ref 1.005–1.030)
WBC, UA: >50 WBC/hpf (ref 0–5)
pH: 8 (ref 5.0–8.0)

## 2023-01-25 MED ORDER — SULFAMETHOXAZOLE-TRIMETHOPRIM 800-160 MG PO TABS: 1.0000 | ORAL_TABLET | Freq: Two times a day (BID) | ORAL | 0 refills | Status: AC

## 2023-01-25 NOTE — Care Management (Addendum)
Transition of Care [facility]) - Emergency Department Mini Assessment   Patient Details  Name: Tanner Griffin MRN: [id number] Date of Birth: [birth year]  [facility] CM/SW Contact:    Roseanne Kaufman, RN Phone Number: 01/25/2023, 2:22 PM   Clinical Narrative: This RNCM received TOC consult for HH/DME. This RNCM spoke with patient and wife at bedside. Patient reports someone comes out to his home to ask questions about his foley however reports he does not have home health services set up. The patient's wife will contact this RNCM when she gets home to advise the name of the company. This RNCM advised patient and his wife that their insurance carrier can be challenging to obtain [facility] services. This RNCM contacted the following [place] agencies: [facility] (declined), [facility] (no response), [facility] (awaiting response). This RNCM notified EDP for [facility] orders, which are in.   TOC will continue to follow, as no HH agencies has accepted patient.   - 2:49p This RNCM received decline from [facility], awaiting response from [facility] agency.   TOC will continue to follow post discharge.  - 4:49pm This RNCM reviewed patient's chart. Patient is currently being followed by [facility] for [facility]. This RNCM contacted Malachy Mood with [facility] to confirm that their agency is currently following this patient for [facility]. Malachy Mood confirmed they are currently following patient. This RNCM spoke with patient to advise he does have [place] with [facility] to assist with educating him with his foley cath. Patient then request needing a HHA to assist with showers, this RNCM advised it's too late to have EDP add additional services, however will contact [facility] to request additional services.  This RNCM contacted Malachy Mood with [facility] that patient needs HHA and it's too late to have EDP add to today's orders. Malachy Mood will notify PCP to request HHA orders.   No additional TOC needs at this time.   ED Mini  Assessment: What brought you to the Emergency Department? : bilateral flank pain  Barriers to Discharge: Other (must enter comment)  Barrier interventions: awaiting HH orders and [place] agency to accept patient.  Means of departure: Car  Interventions which prevented an admission or readmission: [place] or Services    Patient Contact and Communications      Tanner Griffin cell# [phone]  (Spouse) Tanner Griffin cell# [phone], home # [phone]   ,            CMS Medicare.gov Compare Post Acute Care list provided to:: Patient Choice offered to / list presented to : Patient  Admission diagnosis:  Urinary Issues Patient Active Problem List   Diagnosis Date Noted   UTI (urinary tract infection) due to urinary indwelling catheter ([place]) 08/09/2022   Skin erosion 07/05/2022   Loose stools 07/05/2022   Poor social situation 05/20/2022   Flatulence/wind 02/18/2022   Impacted cerumen of left ear 12/01/2021   Bilateral impacted cerumen 10/28/2021   Degenerative arthritis of right elbow 04/06/2021   Distal radius fracture, right 05/30/2020   Macrocytic anemia 03/31/2020   Paget's disease of the bone 12/10/2019   Acute left-sided low back pain without sciatica 11/29/2019   Malignant neoplasm of prostate ([place]) 02/12/2019   Lumbar spinal stenosis 09/25/2018   Right lumbar radiculopathy 09/05/2018   Polyneuropathy 09/05/2018   Right leg weakness 07/26/2018   Elevated PSA 03/16/2018   Right inguinal hernia 03/14/2017   Degenerative disc disease, lumbar 01/11/2017   Arthralgia 04/26/2016   Pain due to total knee replacement ([place]) 04/26/2016  Chronic constipation 01/20/2015   Focal myoclonus 06/05/2014   Osteoarthritis of knee s/p bilateral knee replacements 03/26/2014   Spongiotic dermatitis 03/19/2013   Personal history of colonic adenoma 10/30/2012   BPH with obstruction/lower urinary tract symptoms 06/05/2012   Right knee pain 04/17/2011   Type II diabetes  mellitus, well controlled ([place]) 02/12/2008   Hyperlipidemia 06/19/2007   Essential hypertension 06/19/2007   PCP:  Loura Pardon, MD Pharmacy:   [id number] [place] [address], [address] AT [facility] OF [address] [place] [address] Phone: [phone] Fax: [place], [place] [address] [place] [address] Phone: [phone] Fax: [phone]

## 2023-01-25 NOTE — ED Triage Notes (Signed)
Patient brought in by EMS due to bilateral flank pain. Pt states home health placed a catheter on Friday and now he is having bilateral flank pain, penile pain and feels like "something is not draining right." Pt reports emptying his foley bag twice this morning. Urine noted into the foley bag upon arrival to the ER.

## 2023-01-25 NOTE — ED Provider Notes (Signed)
[place] EMERGENCY DEPARTMENT AT [facility] Provider Note   CSN: [id number] Arrival date & time: 01/25/23  1024     History  Chief Complaint  Patient presents with   Flank Pain    Tanner Griffin is a 82 y.o. male.  HPI 82 year old male with history of DM type II, hyperlipidemia, malignant neoplasm of the prostate with chronic Foley followed by Dr. Claudia Desanctis, lumbar radiculopathy, frequent UTIs, hypertension presents to the ER with complaints of penile pain and back pain with Foley leaking.  Patient states that he had a new Foley placed last Wednesday and since has been having leaking around the Foley with back pain and penile pain.  He states that he feels like the Foley is not working however the Foley has been emptied several times today and there is urine in the Foley as well on my exam.  He denies any fevers or chills.  He was seen by urology yesterday and family at bedside stated "they did not do anything".  Denies any abdominal pain.  Endorses bilateral flank pain.    Home Medications Prior to Admission medications   Medication Sig Start Date End Date Taking? Authorizing Provider  acetaminophen (TYLENOL) 500 MG tablet Take 1,000 mg by mouth every 6 (six) hours as needed for mild pain.    [provider]  alendronate (FOSAMAX) 70 MG tablet Take 1 tablet (70 mg total) by mouth every 7 (seven) days. Take with a full glass of water on an empty stomach. 05/19/22   Libby Maw, MD  amLODipine (NORVASC) 10 MG tablet Take 1 tablet (10 mg total) by mouth daily. 05/19/22   Libby Maw, MD  aspirin EC 81 MG tablet Take 81 mg by mouth daily. Swallow whole.    [provider]  atorvastatin (LIPITOR) 40 MG tablet Take 1 tablet (40 mg total) by mouth daily. 05/19/22   Libby Maw, MD  bismuth subsalicylate (PEPTO-BISMOL) 262 MG chewable tablet Chew 2 tablets (524 mg total) by mouth as needed. Patient not taking: Reported on 08/11/2022  07/05/22   Libby Maw, MD  cholecalciferol (VITAMIN D3) 25 MCG (1000 UNIT) tablet Take 1,000 Units by mouth daily.    [provider]  diclofenac Sodium (VOLTAREN) 1 % GEL Apply a small grape sized dollop to painful joints as needed. Patient taking differently: Apply 2 g topically daily as needed (pain). Apply a small grape sized dollop to painful joints as needed. 07/05/22   Libby Maw, MD  losartan (COZAAR) 25 MG tablet Take 1 tablet (25 mg total) by mouth daily. 08/14/22 11/12/22  Terrilee Croak, MD      Allergies    Telmisartan    Review of Systems   Review of Systems Ten systems reviewed and are negative for acute change, except as noted in the HPI.   Physical Exam Updated Vital Signs BP 127/78   Pulse (!) 57   Temp 98.2 F (36.8 C) (Oral)   Resp 18   Ht '5\' 8"'$  (1.727 m)   Wt 74.8 kg   SpO2 96%   BMI 25.09 kg/m  Physical Exam Vitals and nursing note reviewed.  Constitutional:      General: He is not in acute distress.    Appearance: He is well-developed.  HENT:     Head: Normocephalic and atraumatic.  Eyes:     Conjunctiva/sclera: Conjunctivae normal.  Cardiovascular:     Rate and Rhythm: Normal rate and regular rhythm.  Heart sounds: No murmur heard. Pulmonary:     Effort: Pulmonary effort is normal. No respiratory distress.     Breath sounds: Normal breath sounds.  Abdominal:     Palpations: Abdomen is soft.     Tenderness: There is no abdominal tenderness.  Musculoskeletal:        General: No swelling.     Cervical back: Neck supple.     Comments: Midline lumbar/sacral tenderness to palpation with mild bilateral CVA tenderness  Skin:    General: Skin is warm and dry.     Capillary Refill: Capillary refill takes less than 2 seconds.  Neurological:     Mental Status: He is alert.  Psychiatric:        Mood and Affect: Mood normal.     ED Results / Procedures / Treatments   Labs (all labs ordered are listed, but only  abnormal results are displayed) Labs Reviewed  URINE CULTURE - Abnormal; Notable for the following components:      Result Value   Culture   (*)    Value: >=100,000 COLONIES/mL ENTEROBACTER CLOACAE 20,000 COLONIES/mL ENTEROCOCCUS FAECALIS    Organism ID, Bacteria ENTEROCOCCUS FAECALIS (*)    Organism ID, Bacteria ENTEROBACTER CLOACAE (*)    All other components within normal limits  URINALYSIS, ROUTINE W REFLEX MICROSCOPIC - Abnormal; Notable for the following components:   APPearance HAZY (*)    Hgb urine dipstick MODERATE (*)    Protein, ur 100 (*)    Leukocytes,Ua LARGE (*)    Bacteria, UA RARE (*)    All other components within normal limits    EKG None  Radiology No results found.  Procedures Procedures    Medications Ordered in ED Medications - No data to display  ED Course/ Medical Decision Making/ A&P                             Medical Decision Making Amount and/or Complexity of Data Reviewed Labs: ordered. Radiology: ordered.  Risk Prescription drug management.  82 year old male presenting with Foley leaking, penile pain bilateral flank pain.  He is hypertensive here with blood pressure 186/114 but afebrile, not tachycardic or tachypneic or hypoxic.  Has mild midline tenderness to the lumbar spine and sacrum/also mild bilateral CVA tenderness.  Differential includes pyelonephritis, renal stone, colopathy/osteoarthritis of the spine, metastatic disease.  Fully exchanged and UA ordered, with evidence of large leukocytes and rare bacteria with WBC clumps present.  Sent for culture.  Prior cultures reviewed, positive for E. coli which is sensitive to Bactrim.  He has been started on this in the past.  Obtained a CT renal stone study which did not show any signs of renal stones, metastatic disease.  Evidence of Paget's disease to the right hemipelvis/sacrum.  He states that his PCP had mentioned this 5 months ago but did not give a referral to orthopedics.  I will  provide this today.  We will start him on Bactrim.  Recommended following up with urology.  Family is also requesting a transition of care consult to assist with arranging home health.  Consult for this placed. Final Clinical Impression(s) / ED Diagnoses Final diagnoses:  Flank pain  Paget's disease of bone    Rx / DC Orders ED Discharge Orders          Ordered    sulfamethoxazole-trimethoprim (BACTRIM DS) 800-160 MG tablet  2 times daily        01/25/23  1317    Consult to Transition of Care Team       Comments: Home health  Provider:  (Not yet assigned)   01/25/23 [facility]        01/25/23 1339    Face-to-face encounter (required for Medicare/Medicaid patients)       Comments: [place] certify that this patient is under my care and that I, or a nurse practitioner or physician's assistant working with me, had a face-to-face encounter that meets the physician face-to-face encounter requirements with this patient on 01/25/2023. The encounter with the patient was in whole, or in part for the following medical condition(s) which is the primary reason for home health care (List medical condition): prostate cancer, chronic weakness   01/25/23 Rogers, Christianne Zacher A, PA-C 02/15/23 [id number]    Tegeler, Gwenyth Allegra, MD 02/16/23 [phone]

## 2023-01-25 NOTE — Discharge Instructions (Addendum)
Please follow-up with Dr. Inda Merlin for your Paget's disease.  Take the antibiotic as directed until finished.  Please follow-up with urology.  Social work will be in contact with you about arranging home health.  Return to the ER for any new or worsening symptoms.

## 2023-01-29 LAB — URINE CULTURE: Culture: >=100000 — AB

## 2023-01-30 ENCOUNTER — Telehealth (HOSPITAL_BASED_OUTPATIENT_CLINIC_OR_DEPARTMENT_OTHER): Payer: Self-pay | Admitting: *Deleted

## 2023-01-30 NOTE — Progress Notes (Signed)
ED Antimicrobial Stewardship Positive Culture Follow Up   Tanner Griffin is an 82 y.o. male who presented to [facility] on 01/25/2023 with a chief complaint of  Chief Complaint  Patient presents with   Flank Pain    Recent Results (from the past 720 hour(s))  Urine Culture (for pregnant, neutropenic or urologic patients or patients with an indwelling urinary catheter)     Status: Abnormal   Collection Time: 01/25/23  1:23 PM   Specimen: Urine, Catheterized  Result Value Ref Range Status   Specimen Description   Final    URINE, CATHETERIZED Performed at [facility] [address], [place], [place] [address]    Special Requests   Final    NONE Performed at [facility], [place] [address], [place], [place] [address]    Culture (A)  Final    >=100,000 COLONIES/mL ENTEROBACTER CLOACAE 20,000 COLONIES/mL ENTEROCOCCUS FAECALIS    Report Status 01/29/2023 FINAL  Final   Organism ID, Bacteria ENTEROCOCCUS FAECALIS (A)  Final   Organism ID, Bacteria ENTEROBACTER CLOACAE (A)  Final      Susceptibility   Enterobacter cloacae - MIC*    CEFAZOLIN RESISTANT Resistant     CEFEPIME <=0.12 SENSITIVE Sensitive     CIPROFLOXACIN <=0.25 SENSITIVE Sensitive     GENTAMICIN <=1 SENSITIVE Sensitive     IMIPENEM 0.5 SENSITIVE Sensitive     NITROFURANTOIN 32 SENSITIVE Sensitive     TRIMETH/SULFA <=20 SENSITIVE Sensitive     PIP/TAZO <=4 SENSITIVE Sensitive     * >=100,000 COLONIES/mL ENTEROBACTER CLOACAE   Enterococcus faecalis - MIC*    AMPICILLIN <=2 SENSITIVE Sensitive     NITROFURANTOIN <=16 SENSITIVE Sensitive     VANCOMYCIN 2 SENSITIVE Sensitive     * 20,000 COLONIES/mL ENTEROCOCCUS FAECALIS    Pt was prescribed SMX/TMP for UTI. No SCr obtained. CrCl calculated using labs from Nov 2023. UCx growing >100K enterobacter cloacae (sensitive to SMX/TMP) and 20k enterococcus faecalis (pan-sensitive).   Discussed with ED PA - SMX/TMP does not cover  enterococcus, but only 20k colonies likely not significant. Will plan to perform a symptom check to ensure pt is improving on the prescribed SMX/TMP. If pt still having UTI symptoms (dysuria, abdominal/flank pain, fever, N/V, chills, increased urinary urgency) then will advise pt to complete prescribed course of SMX/TMP and call in additional prescription for amoxicillin (see below).   New antibiotic prescription: amoxicillin 500 mg PO q8h x 7 days if still symptomatic. No refills.   ED Provider: [name], PA-C   Lenis Noon, PharmD 01/30/2023, 1:00 PM Clinical Pharmacist [phone]

## 2023-01-30 NOTE — Telephone Encounter (Signed)
Post ED Visit - Positive Culture Follow-up  Culture report reviewed by antimicrobial stewardship pharmacist: [place] Team []$  Elenor Quinones, Pharm.D. []$  Heide Guile, Pharm.D., BCPS AQ-ID []$  Parks Neptune, Pharm.D., BCPS []$  Alycia Rossetti, Pharm.D., BCPS []$  [place], Pharm.D., BCPS, AAHIVP []$  Legrand Como, Pharm.D., BCPS, AAHIVP []$  Salome Arnt, PharmD, BCPS []$  Johnnette Gourd, PharmD, BCPS []$  Hughes Better, PharmD, BCPS []$  Leeroy Cha, PharmD []$  Laqueta Linden, PharmD, BCPS []$  Albertina Parr, PharmD  [place] Team []$  Leodis Sias, PharmD []$  Lindell Spar, PharmD []$  Royetta Asal, PharmD []$  Graylin Shiver, Rph []$  Rema Fendt) Glennon Mac, PharmD []$  Arlyn Dunning, PharmD []$  Netta Cedars, PharmD []$  Dia Sitter, PharmD []$  Leone Haven, PharmD []$  Gretta Arab, PharmD [x]$  Theodis Shove, PharmD []$  Peggyann Juba, PharmD []$  Reuel Boom, PharmD   Positive urine culture Treated with Sulfameethoxazole-Trimethoprim, organism sensitive to the same and no further patient follow-up is required at this time.  Called for symptom check. Pt not experiencing any urinary symptoms or fever/ abd/flank pain. Pt feeling much better. No changes per [facility], PA  Hart Carwin Carnegie 01/30/2023, 2:26 PM

## 2023-02-24 ENCOUNTER — Ambulatory Visit: Payer: Medicare Other | Admitting: Orthopedic Surgery

## 2023-02-25 ENCOUNTER — Emergency Department (HOSPITAL_COMMUNITY)
Admission: EM | Admit: 2023-02-25 | Discharge: 2023-02-25 | Disposition: A | Discharge status: Home / Self Care | Payer: Medicare Other | Attending: Emergency Medicine | Admitting: Emergency Medicine

## 2023-02-25 ENCOUNTER — Encounter (HOSPITAL_COMMUNITY): Payer: Self-pay | Admitting: *Deleted

## 2023-02-25 ENCOUNTER — Other Ambulatory Visit: Payer: Self-pay

## 2023-02-25 DIAGNOSIS — N3000 Acute cystitis without hematuria: Secondary | ICD-10-CM | POA: Insufficient documentation

## 2023-02-25 DIAGNOSIS — Y732 Prosthetic and other implants, materials and accessory gastroenterology and urology devices associated with adverse incidents: Secondary | ICD-10-CM | POA: Diagnosis not present

## 2023-02-25 DIAGNOSIS — T839XXA Unspecified complication of genitourinary prosthetic device, implant and graft, initial encounter: Secondary | ICD-10-CM

## 2023-02-25 DIAGNOSIS — Z7982 Long term (current) use of aspirin: Secondary | ICD-10-CM | POA: Insufficient documentation

## 2023-02-25 DIAGNOSIS — Z79899 Other long term (current) drug therapy: Secondary | ICD-10-CM | POA: Diagnosis not present

## 2023-02-25 DIAGNOSIS — T83091A Other mechanical complication of indwelling urethral catheter, initial encounter: Secondary | ICD-10-CM | POA: Diagnosis present

## 2023-02-25 LAB — URINALYSIS, ROUTINE W REFLEX MICROSCOPIC
Bacteria, UA: NONE SEEN
Bilirubin Urine: NEGATIVE
Glucose, UA: NEGATIVE mg/dL
Ketones, ur: NEGATIVE mg/dL
Nitrite: NEGATIVE
Protein, ur: NEGATIVE mg/dL
RBC / HPF: >50 RBC/hpf (ref 0–5)
Specific Gravity, Urine: 1.009 (ref 1.005–1.030)
pH: 5 (ref 5.0–8.0)

## 2023-02-25 LAB — CBC WITH DIFFERENTIAL/PLATELET
Abs Immature Granulocytes: 0.03 10*3/uL (ref 0.00–0.07)
Basophils Absolute: 0.1 10*3/uL (ref 0.0–0.1)
Basophils Relative: 1 %
Eosinophils Absolute: 0.1 10*3/uL (ref 0.0–0.5)
Eosinophils Relative: 2 %
HCT: 37.8 % — ABNORMAL LOW (ref 39.0–52.0)
Hemoglobin: 12.3 g/dL — ABNORMAL LOW (ref 13.0–17.0)
Immature Granulocytes: 1 %
Lymphocytes Relative: 38 %
Lymphs Abs: 2.2 10*3/uL (ref 0.7–4.0)
MCH: 32.9 pg (ref 26.0–34.0)
MCHC: 32.5 g/dL (ref 30.0–36.0)
MCV: 101.1 fL — ABNORMAL HIGH (ref 80.0–100.0)
Monocytes Absolute: 0.5 10*3/uL (ref 0.1–1.0)
Monocytes Relative: 8 %
Neutro Abs: 2.9 10*3/uL (ref 1.7–7.7)
Neutrophils Relative %: 50 %
Platelets: 225 10*3/uL (ref 150–400)
RBC: 3.74 MIL/uL — ABNORMAL LOW (ref 4.22–5.81)
RDW: 12.6 % (ref 11.5–15.5)
WBC: 5.8 10*3/uL (ref 4.0–10.5)
nRBC: 0 % (ref 0.0–0.2)

## 2023-02-25 LAB — BASIC METABOLIC PANEL
Anion gap: 8 (ref 5–15)
BUN: 27 mg/dL — ABNORMAL HIGH (ref 8–23)
CO2: 29 mmol/L (ref 22–32)
Calcium: 9.1 mg/dL (ref 8.9–10.3)
Chloride: 101 mmol/L (ref 98–111)
Creatinine, Ser: 1.2 mg/dL (ref 0.61–1.24)
GFR, Estimated: >60 mL/min (ref >60)
Glucose, Bld: 88 mg/dL (ref 70–99)
Potassium: 3.5 mmol/L (ref 3.5–5.1)
Sodium: 138 mmol/L (ref 135–145)

## 2023-02-25 MED ORDER — CEPHALEXIN 500 MG PO CAPS: 500.0000 mg | ORAL_CAPSULE | Freq: Four times a day (QID) | ORAL | 0 refills | Status: DC

## 2023-02-25 NOTE — Discharge Instructions (Signed)
You are seen today in the emergency department due to Foley catheter intact.  Urine did show a large amount of white cells which could be indicated for a UTI, given you are having burning with urination I recommend taking the Keflex without milligrams twice daily for 5 days.  Follow-up with urologist in the next week or so for reevaluation.  Return to the ED for new or concerning symptoms.

## 2023-02-25 NOTE — ED Provider Notes (Signed)
[place] EMERGENCY DEPARTMENT AT [facility] Provider Note   CSN: [id number] Arrival date & time: 02/25/23  1222     History  Chief Complaint  Patient presents with   urinary catheter issues    BOL MASIELLO is a 82 y.o. male.  HPI   This is a 82 year old male presenting to the emergency department due to Foley catheter issues.  Patient has a Foley catheter which was changed out monthly, it was changed yesterday and has been leaking since then.  He noticed blood in the urine which is never happened before, he also has a suprapubic burning sensation which he tells me is different from normal although he told triage nurse that it felt the same?.  He is not having any flank pain, nausea, vomiting or fevers.  No recent injuries or falls.  Home Medications Prior to Admission medications   Medication Sig Start Date End Date Taking? Authorizing Provider  cephALEXin (KEFLEX) 500 MG capsule Take 1 capsule (500 mg total) by mouth 4 (four) times daily. 02/25/23  Yes [name], PA-C  acetaminophen (TYLENOL) 500 MG tablet Take 1,000 mg by mouth every 6 (six) hours as needed for mild pain.    [provider]  alendronate (FOSAMAX) 70 MG tablet Take 1 tablet (70 mg total) by mouth every 7 (seven) days. Take with a full glass of water on an empty stomach. 05/19/22   Libby Maw, MD  amLODipine (NORVASC) 10 MG tablet Take 1 tablet (10 mg total) by mouth daily. 05/19/22   Libby Maw, MD  aspirin EC 81 MG tablet Take 81 mg by mouth daily. Swallow whole.    [provider]  atorvastatin (LIPITOR) 40 MG tablet Take 1 tablet (40 mg total) by mouth daily. 05/19/22   Libby Maw, MD  bismuth subsalicylate (PEPTO-BISMOL) 262 MG chewable tablet Chew 2 tablets (524 mg total) by mouth as needed. Patient not taking: Reported on 08/11/2022 07/05/22   Libby Maw, MD  cholecalciferol (VITAMIN D3) 25 MCG (1000 UNIT) tablet Take 1,000 Units by  mouth daily.    [provider]  diclofenac Sodium (VOLTAREN) 1 % GEL Apply a small grape sized dollop to painful joints as needed. Patient taking differently: Apply 2 g topically daily as needed (pain). Apply a small grape sized dollop to painful joints as needed. 07/05/22   Libby Maw, MD  losartan (COZAAR) 25 MG tablet Take 1 tablet (25 mg total) by mouth daily. 08/14/22 11/12/22  Terrilee Croak, MD      Allergies    Telmisartan    Review of Systems   Review of Systems  Physical Exam Updated Vital Signs BP 126/82 (BP Location: Right Arm)   Pulse 62   Temp 98.6 F (37 C) (Oral)   Resp 16   Ht '5\' 7"'$  (1.702 m)   Wt 73.9 kg   SpO2 100%   BMI 25.53 kg/m  Physical Exam Vitals and nursing note reviewed. Exam conducted with a chaperone present.  Constitutional:      Appearance: Normal appearance.  HENT:     Head: Normocephalic and atraumatic.  Eyes:     General: No scleral icterus.       Right eye: No discharge.        Left eye: No discharge.     Extraocular Movements: Extraocular movements intact.     Pupils: Pupils are equal, round, and reactive to light.  Cardiovascular:     Rate and Rhythm: Normal  rate and regular rhythm.     Pulses: Normal pulses.     Heart sounds: Normal heart sounds.     No friction rub. No gallop.  Pulmonary:     Effort: Pulmonary effort is normal. No respiratory distress.     Breath sounds: Normal breath sounds.  Abdominal:     General: Abdomen is flat. Bowel sounds are normal. There is no distension.     Palpations: Abdomen is soft.     Tenderness: There is no abdominal tenderness.  Skin:    General: Skin is warm and dry.     Coloration: Skin is not jaundiced.  Neurological:     Mental Status: He is alert. Mental status is at baseline.     Coordination: Coordination normal.     ED Results / Procedures / Treatments   Labs (all labs ordered are listed, but only abnormal results are displayed) Labs Reviewed  CBC WITH  DIFFERENTIAL/PLATELET - Abnormal; Notable for the following components:      Result Value   RBC 3.74 (*)    Hemoglobin 12.3 (*)    HCT 37.8 (*)    MCV 101.1 (*)    All other components within normal limits  BASIC METABOLIC PANEL - Abnormal; Notable for the following components:   BUN 27 (*)    All other components within normal limits  URINALYSIS, ROUTINE W REFLEX MICROSCOPIC - Abnormal; Notable for the following components:   Hgb urine dipstick LARGE (*)    Leukocytes,Ua MODERATE (*)    All other components within normal limits  URINE CULTURE    EKG None  Radiology No results found.  Procedures Procedures    Medications Ordered in ED Medications - No data to display  ED Course/ Medical Decision Making/ A&P                             Medical Decision Making Amount and/or Complexity of Data Reviewed Labs: ordered.  Risk Prescription drug management.   This is a 82 year old presenting to the emergency department due to Foley catheter problems.  Differential includes misplaced catheter, UTI, pyelonephritis, nephrolithiasis, AKI.  Patient's spouse is at bedside providing independent history.  He has no CVA tenderness, no abdominal tenderness on exam.  Will replace Foley catheter, check basic labs renal function and obtain a UA given having urinary symptoms.  I ordered, viewed and interpreted laboratory workup. No leukocytosis, mild anemia. No gross electrolyte derangement of AKI. Hematuria and leukocyturia concerning for possible UTI given suprapubic burning, culture obtained.   I reeval the patient who is feeling improved.  Catheter is fitting better and there is no longer any leakage.  There is no signs of renal impairment or AKI.  No flank pain, do not think this is nephrolithiasis.  Also no SIRS criteria, not septic.  Clinically patient is not septic.  He does have a UTI, he is chronically catheterized will obtain culture but given he was having suprapubic burning I  do think it is reasonable to cover with Keflex which I have ordered.  Will have him follow-up closely with a urologist, return precautions were discussed.  Stable for outpatient follow-up at this time.        Final Clinical Impression(s) / ED Diagnoses Final diagnoses:  Problem with Foley catheter, initial encounter ([place])  Acute cystitis without hematuria    Rx / DC Orders ED Discharge Orders          Ordered  cephALEXin (KEFLEX) 500 MG capsule  4 times daily        02/25/23 1528              [name], [place] 02/25/23 2023    Gareth Morgan, MD 02/25/23 2333

## 2023-02-25 NOTE — ED Provider Triage Note (Signed)
Emergency Medicine Provider Triage Evaluation Note  Tanner Griffin , a 82 y.o. male  was evaluated in triage.  Pt complains of Foley issues patient states his Foley catheter was changed yesterday, has been leaking since then.  He noticed bright red blood in which she has never had previously in his urine bag, he is also having burning sensation suprapubically.  No flank pain, nausea, vomiting or fevers..  Review of Systems  PER HPI  Physical Exam  BP 121/65 (BP Location: Left Arm)   Pulse (!) 59   Temp 98.8 F (37.1 C) (Oral)   Resp 16   SpO2 100%  Gen:   Awake, no distress   Resp:  Normal effort  MSK:   Moves extremities without difficulty  Other:    Medical Decision Making  Medically screening exam initiated at 12:56 PM.  Appropriate orders placed.  Tanner Griffin was informed that the remainder of the evaluation will be completed by another provider, this initial triage assessment does not replace that evaluation, and the importance of remaining in the ED until their evaluation is complete.     Tanner Raring, PA-C 02/25/23 1256

## 2023-02-25 NOTE — ED Triage Notes (Signed)
Here by POV from home for urinary catheter issues, placed yesterday at [facility] PCP, describes as leaking around catheter tube, and saw some blood in urine bag. Clear light yellow, no obvious blood noted. Denies pain, fever, or retention. Reports some usual burning from catheter.

## 2023-02-27 LAB — URINE CULTURE: Culture: 60000 — AB

## 2023-02-28 ENCOUNTER — Telehealth (HOSPITAL_BASED_OUTPATIENT_CLINIC_OR_DEPARTMENT_OTHER): Payer: Self-pay | Admitting: Emergency Medicine

## 2023-02-28 NOTE — Progress Notes (Signed)
ED Antimicrobial Stewardship Positive Culture Follow Up   Tanner Griffin is an 82 y.o. male who presented to [facility] on 02/25/2023 with a chief complaint of  Chief Complaint  Patient presents with   urinary catheter issues    Recent Results (from the past 720 hour(s))  Remove urinary catheter to obtain Straight Cath urine culture     Status: Abnormal   Collection Time: 02/25/23  2:25 PM   Specimen: In/Out Cath Urine  Result Value Ref Range Status   Specimen Description   Final    IN/OUT CATH URINE Performed at [facility] [address], [place], [place] [address]    Special Requests   Final    NONE Performed at [facility], [place] [address] [place], [place] [address]    Culture 60,000 COLONIES/mL YEAST (A)  Final   Report Status 02/27/2023 FINAL  Final    '[x]'$  No additional treatment needed  75 YOM with chronic foley recently exchanged who presented with complaints of leaking around the foley and questionable burning (noted some degree at baseline). UCx grew 60k yeast which is likely colonization and not pathogenic. No additional treatment indicated at this time.  ED Provider: Lajean Saver, MD   Alycia Rossetti, PharmD, BCPS 02/28/2023, 9:00 AM Clinical Pharmacist Monday - Friday phone -  [phone] Saturday - Sunday phone - [phone]

## 2023-02-28 NOTE — Telephone Encounter (Signed)
Post ED Visit - Positive Culture Follow-up  Culture report reviewed by antimicrobial stewardship pharmacist: [place] Team '[]'$  Elenor Quinones, Pharm.D. '[]'$  Heide Guile, Pharm.D., BCPS AQ-ID '[]'$  Parks Neptune, Pharm.D., BCPS '[x]'$  Alycia Rossetti, Pharm.D., BCPS '[]'$  [place], [place].D., BCPS, AAHIVP '[]'$  Legrand Como, Pharm.D., BCPS, AAHIVP '[]'$  Salome Arnt, PharmD, BCPS '[]'$  Johnnette Gourd, PharmD, BCPS '[]'$  Hughes Better, PharmD, BCPS '[]'$  Leeroy Cha, PharmD '[]'$  Laqueta Linden, PharmD, BCPS '[]'$  Albertina Parr, PharmD  [place] Team '[]'$  Leodis Sias, PharmD '[]'$  Lindell Spar, PharmD '[]'$  Royetta Asal, PharmD '[]'$  Graylin Shiver, Rph '[]'$  Rema Fendt) Glennon Mac, PharmD '[]'$  Arlyn Dunning, PharmD '[]'$  Netta Cedars, PharmD '[]'$  Dia Sitter, PharmD '[]'$  Leone Haven, PharmD '[]'$  Gretta Arab, PharmD '[]'$  Theodis Shove, PharmD '[]'$  Peggyann Juba, PharmD '[]'$  Reuel Boom, PharmD   Positive urine culture Treated with none, low colony count, asymptomatic,  no further patient follow-up is required at this time.  Hazle Nordmann 02/28/2023, 12:25 PM

## 2023-04-06 ENCOUNTER — Encounter: Payer: Self-pay | Admitting: *Deleted

## 2023-04-12 ENCOUNTER — Other Ambulatory Visit: Payer: Self-pay | Admitting: Family Medicine

## 2023-04-12 DIAGNOSIS — E1159 Type 2 diabetes mellitus with other circulatory complications: Secondary | ICD-10-CM

## 2023-05-30 ENCOUNTER — Encounter (HOSPITAL_COMMUNITY): Payer: Self-pay

## 2023-05-30 ENCOUNTER — Other Ambulatory Visit: Payer: Self-pay

## 2023-05-30 ENCOUNTER — Emergency Department (HOSPITAL_COMMUNITY): Payer: Medicare HMO

## 2023-05-30 ENCOUNTER — Emergency Department (HOSPITAL_COMMUNITY)
Admission: EM | Admit: 2023-05-30 | Discharge: 2023-05-30 | Disposition: A | Discharge status: Home / Self Care | Payer: Medicare HMO | Attending: Emergency Medicine | Admitting: Emergency Medicine

## 2023-05-30 DIAGNOSIS — Z8546 Personal history of malignant neoplasm of prostate: Secondary | ICD-10-CM | POA: Insufficient documentation

## 2023-05-30 DIAGNOSIS — I10 Essential (primary) hypertension: Secondary | ICD-10-CM | POA: Insufficient documentation

## 2023-05-30 DIAGNOSIS — D649 Anemia, unspecified: Secondary | ICD-10-CM | POA: Diagnosis not present

## 2023-05-30 DIAGNOSIS — R197 Diarrhea, unspecified: Secondary | ICD-10-CM | POA: Diagnosis not present

## 2023-05-30 DIAGNOSIS — R1084 Generalized abdominal pain: Secondary | ICD-10-CM | POA: Insufficient documentation

## 2023-05-30 DIAGNOSIS — Z466 Encounter for fitting and adjustment of urinary device: Secondary | ICD-10-CM | POA: Insufficient documentation

## 2023-05-30 DIAGNOSIS — E860 Dehydration: Secondary | ICD-10-CM | POA: Diagnosis not present

## 2023-05-30 LAB — COMPREHENSIVE METABOLIC PANEL
ALT: 19 U/L (ref 0–44)
AST: 22 U/L (ref 15–41)
Albumin: 3.8 g/dL (ref 3.5–5.0)
Alkaline Phosphatase: 84 U/L (ref 38–126)
Anion gap: 9 (ref 5–15)
BUN: 34 mg/dL — ABNORMAL HIGH (ref 8–23)
CO2: 28 mmol/L (ref 22–32)
Calcium: 9.1 mg/dL (ref 8.9–10.3)
Chloride: 102 mmol/L (ref 98–111)
Creatinine, Ser: 1.53 mg/dL — ABNORMAL HIGH (ref 0.61–1.24)
GFR, Estimated: 46 mL/min — ABNORMAL LOW (ref >60)
Glucose, Bld: 115 mg/dL — ABNORMAL HIGH (ref 70–99)
Potassium: 3.8 mmol/L (ref 3.5–5.1)
Sodium: 139 mmol/L (ref 135–145)
Total Bilirubin: 1 mg/dL (ref 0.3–1.2)
Total Protein: 7.6 g/dL (ref 6.5–8.1)

## 2023-05-30 LAB — URINALYSIS, ROUTINE W REFLEX MICROSCOPIC
Bilirubin Urine: NEGATIVE
Glucose, UA: NEGATIVE mg/dL
Hgb urine dipstick: NEGATIVE
Ketones, ur: NEGATIVE mg/dL
Nitrite: NEGATIVE
Protein, ur: NEGATIVE mg/dL
Specific Gravity, Urine: 1.008 (ref 1.005–1.030)
pH: 6 (ref 5.0–8.0)

## 2023-05-30 LAB — CBC
HCT: 35.5 % — ABNORMAL LOW (ref 39.0–52.0)
Hemoglobin: 11.7 g/dL — ABNORMAL LOW (ref 13.0–17.0)
MCH: 32.8 pg (ref 26.0–34.0)
MCHC: 33 g/dL (ref 30.0–36.0)
MCV: 99.4 fL (ref 80.0–100.0)
Platelets: 224 10*3/uL (ref 150–400)
RBC: 3.57 MIL/uL — ABNORMAL LOW (ref 4.22–5.81)
RDW: 12.1 % (ref 11.5–15.5)
WBC: 5.7 10*3/uL (ref 4.0–10.5)
nRBC: 0 % (ref 0.0–0.2)

## 2023-05-30 LAB — LIPASE, BLOOD: Lipase: 38 U/L (ref 11–51)

## 2023-05-30 MED ORDER — IOHEXOL 300 MG/ML  SOLN
80.0000 mL | Freq: Once | INTRAMUSCULAR | Status: AC | PRN
  Administered 2023-05-30: 80 mL via INTRAVENOUS

## 2023-05-30 MED ORDER — LOPERAMIDE HCL 2 MG PO CAPS: 2.0000 mg | ORAL_CAPSULE | Freq: Four times a day (QID) | ORAL | 0 refills | Status: AC | PRN

## 2023-05-30 MED ORDER — ONDANSETRON 4 MG PO TBDP: 4.0000 mg | ORAL_TABLET | Freq: Three times a day (TID) | ORAL | 0 refills | Status: AC | PRN

## 2023-05-30 MED ORDER — SODIUM CHLORIDE 0.9 % IV BOLUS
1000.0000 mL | Freq: Once | INTRAVENOUS | Status: AC
  Administered 2023-05-30: 1000 mL via INTRAVENOUS

## 2023-05-30 NOTE — ED Provider Notes (Signed)
Emergency Department Provider Note   I have reviewed the triage vital signs and the nursing notes.   HISTORY  Chief Complaint Diarrhea and Abdominal Pain   HPI Tanner Griffin is a 82 y.o. male with PMH of indwelling foley catheter, HTN, HLD presents to the emergency department for evaluation of generalized abdominal pain with 6 days of nonbloody diarrhea.  He states the diarrhea is watery and "just running out of me." Denies any recent antibiotics or hospitalization.  He does have a chronic indwelling Foley catheter which has not been changed in the past 40 days.  States is supposed to be changed every 29 days but home health has not been by to do this for him.  No leaking or other symptoms.  No fevers or chills.  Has any upper abdominal pain or vomiting.  No chest pain or shortness of breath. Has had increased generalized weakness at home. He is typically able to get out and do basic yard work but has not had the energy to do this in the last 5-6 days.  Past Medical History:  Diagnosis Date   Arthritis    Arthrofibrosis of total knee arthroplasty, right 03/26/2014   Constipation    takes Miralax daily as needed   Headache    occasional   HYPERLIPIDEMIA    takes Lipitor daily   HYPERTENSION    takes Amlodipine and Labetalol daily   Joint pain    Joint swelling    Nocturia    Osteoarthritis of left knee 03/26/2014   Pneumonia    hx of-as a child   Prostate cancer (HCC)    Urinary frequency    takes Flomax daily   Wears eyeglasses    Wears partial dentures    top     Review of Systems  Constitutional: No fever/chills. Positive weakness.  Cardiovascular: Denies chest pain. Respiratory: Denies shortness of breath. Gastrointestinal: Positive abdominal pain.  No nausea, no vomiting. Positive diarrhea.  No constipation. Genitourinary: Negative for dysuria. Musculoskeletal: Negative for back pain. Skin: Negative for rash. Neurological: Negative for headaches, focal weakness  or numbness.  ____________________________________________   PHYSICAL EXAM:  VITAL SIGNS: ED Triage Vitals  Enc Vitals Group     BP 05/30/23 0835 115/67     Pulse Rate 05/30/23 0835 60     Resp 05/30/23 0835 18     Temp 05/30/23 0835 97.9 F (36.6 C)     Temp Source 05/30/23 0835 Oral     SpO2 05/30/23 0835 100 %     Weight 05/30/23 0844 163 lb (73.9 kg)     Height 05/30/23 0844 5\' 7"  (1.702 m)   Constitutional: Alert and oriented. Well appearing and in no acute distress. Eyes: Conjunctivae are normal.  Head: Atraumatic. Nose: No congestion/rhinnorhea. Mouth/Throat: Mucous membranes are moist.   Neck: No stridor.  Cardiovascular: Normal rate, regular rhythm. Good peripheral circulation. Grossly normal heart sounds.   Respiratory: Normal respiratory effort.  No retractions. Lungs CTAB. Gastrointestinal: Soft with mild lower abdominal tenderness. No peritonitis. No distention.  Musculoskeletal: No lower extremity tenderness nor edema. No gross deformities of extremities. Neurologic:  Normal speech and language. No gross focal neurologic deficits are appreciated.  Skin:  Skin is warm, dry and intact. No rash noted.   ____________________________________________   LABS (all labs ordered are listed, but only abnormal results are displayed)  Labs Reviewed  COMPREHENSIVE METABOLIC PANEL - Abnormal; Notable for the following components:      Result Value  Glucose, Bld 115 (*)    BUN 34 (*)    Creatinine, Ser 1.53 (*)    GFR, Estimated 46 (*)    All other components within normal limits  CBC - Abnormal; Notable for the following components:   RBC 3.57 (*)    Hemoglobin 11.7 (*)    HCT 35.5 (*)    All other components within normal limits  URINALYSIS, ROUTINE W REFLEX MICROSCOPIC - Abnormal; Notable for the following components:   Color, Urine STRAW (*)    Leukocytes,Ua SMALL (*)    Bacteria, UA RARE (*)    All other components within normal limits  URINE CULTURE  C  DIFFICILE QUICK SCREEN W PCR REFLEX    LIPASE, BLOOD   ____________________________________________  RADIOLOGY  CT ABDOMEN PELVIS W CONTRAST  Result Date: 05/30/2023 CLINICAL DATA:  Abdominal pain, acute, nonlocalized. Generalized abdominal pain and diarrhea. EXAM: CT ABDOMEN AND PELVIS WITH CONTRAST TECHNIQUE: Multidetector CT imaging of the abdomen and pelvis was performed using the standard protocol following bolus administration of intravenous contrast. RADIATION DOSE REDUCTION: This exam was performed according to the departmental dose-optimization program which includes automated exposure control, adjustment of the mA and/or kV according to patient size and/or use of iterative reconstruction technique. CONTRAST:  80mL OMNIPAQUE IOHEXOL 300 MG/ML  SOLN COMPARISON:  CT abdomen/pelvis 01/25/2023. FINDINGS: Lower chest: No acute abnormality.  Coronary artery calcifications. Hepatobiliary: Hepatic steatosis. Gallbladder is unremarkable. No biliary dilatation. Pancreas: Unremarkable. No pancreatic ductal dilatation or surrounding inflammatory changes. Spleen: Normal. Adrenals/Urinary Tract: Adrenal glands are unremarkable. Kidneys are normal, without renal calculi, focal lesion, or hydronephrosis. Bladder is decompressed by Foley catheter. Stomach/Bowel: Normal stomach and duodenum. No dilated loops of small bowel. Normal appendix is visualized on coronal image 56 series 8. Colon is unremarkable. Vascular/Lymphatic: Aortic atherosclerosis. No enlarged abdominal or pelvic lymph nodes. Reproductive: Brachytherapy seeds in the prostate. Other: No abdominal wall hernia or abnormality. No abdominopelvic ascites. Musculoskeletal: Redemonstrated Paget's disease of the pelvis and sacrum. IMPRESSION: 1. No acute abnormality in the abdomen or pelvis. 2. Hepatic steatosis. 3. Redemonstrated Paget's disease of the pelvis and sacrum. 4. Coronary artery disease. Aortic Atherosclerosis (ICD10-I70.0). Electronically Signed    By: Orvan Falconer M.D.   On: 05/30/2023 12:03    ____________________________________________   PROCEDURES  Procedure(s) performed:   Procedures  None  ____________________________________________   INITIAL IMPRESSION / ASSESSMENT AND PLAN / ED COURSE  Pertinent labs & imaging results that were available during my care of the patient were reviewed by me and considered in my medical decision making (see chart for details).   This patient is Presenting for Evaluation of abdominal pain, which does require a range of treatment options, and is a complaint that involves a high risk of morbidity and mortality.  The Differential Diagnoses includes but is not exclusive to acute appendicitis, renal colic, testicular torsion, urinary tract infection, prostatitis,  diverticulitis, small bowel obstruction, colitis, abdominal aortic aneurysm, gastroenteritis, constipation etc.   Critical Interventions-    Medications  sodium chloride 0.9 % bolus 1,000 mL (0 mLs Intravenous Stopped 05/30/23 1105)  iohexol (OMNIPAQUE) 300 MG/ML solution 80 mL (80 mLs Intravenous Contrast Given 05/30/23 1043)    Reassessment after intervention:  symptoms/weakness improved after IVF.    I did obtain Additional Historical Information from wife at bedside.  I decided to review pertinent External Data, and in summary no recent ED visits or abx use.    Clinical Laboratory Tests Ordered, included UA without evidence of infection. Creatinine  slightly elevated to 1.53 but not AKI. Normal electrolytes. CBC without leukocytosis. Mild anemia. Lipase normal.   Radiologic Tests Ordered, included CT abdomen/pelvis. I independently interpreted the images and agree with radiology interpretation.   Cardiac Monitor Tracing which shows NSR.    Social Determinants of Health Risk patient is a non-smoker.   Medical Decision Making: Summary:  Patient presents emergency department with abdominal pain with profuse watery  diarrhea.  Plan for C. difficile testing, CT imaging, reassess after IV fluids.  Overall abdominal exam is less concerning, however, given age and medical comorbidities plan to move forward with scanning.  Reevaluation with update and discussion with patient and wife. No diarrhea here for c diff sample. Labs and imaging reassuring. No acute lab abnormalities. Plan for PO fluids at home and symptom mgmt with PCP follow up. Discussed strict ED return precautions. Patient and wife comfortable with the plan at discharge.   Considered admission but symptoms improved and ED workup reassuring.   Patient's presentation is most consistent with acute presentation with potential threat to life or bodily function.   Disposition: discharge  ____________________________________________  FINAL CLINICAL IMPRESSION(S) / ED DIAGNOSES  Final diagnoses:  Dehydration  Diarrhea, unspecified type  Urinary catheter (Foley) change required     NEW OUTPATIENT MEDICATIONS STARTED DURING THIS VISIT:  New Prescriptions   LOPERAMIDE (IMODIUM) 2 MG CAPSULE    Take 1 capsule (2 mg total) by mouth 4 (four) times daily as needed for diarrhea or loose stools.   ONDANSETRON (ZOFRAN-ODT) 4 MG DISINTEGRATING TABLET    Take 1 tablet (4 mg total) by mouth every 8 (eight) hours as needed for nausea or vomiting.    Note:  This document was prepared using Dragon voice recognition software and may include unintentional dictation errors.  Alona Bene, MD, [facility] Emergency [facility]    Whittney Steenson, Arlyss Repress, MD 05/30/23 [phone]

## 2023-05-30 NOTE — Discharge Instructions (Signed)
I have called in some medications to help with your diarrhea symptoms.  Please drink plenty of fluids and follow-up with your primary care doctor this week.  If you develop any worsening symptoms such as pain, fever, worsening dehydration, please return to the ED for evaluation.

## 2023-05-30 NOTE — ED Notes (Signed)
Patient transported to CT 

## 2023-05-30 NOTE — ED Triage Notes (Signed)
Pt c/o generalized abdominal pain and diarrhea x6 days.  Pain score 5/10.  Also, Pt is requesting his foley catheter be changed.  [facility] 10 days late.

## 2023-05-31 LAB — URINE CULTURE

## 2023-06-02 LAB — URINE CULTURE: Culture: >=100000 — AB

## 2023-06-02 LAB — LAB REPORT - SCANNED
EGFR: 35
PSA, Total: 0.14

## 2023-06-03 ENCOUNTER — Telehealth (HOSPITAL_BASED_OUTPATIENT_CLINIC_OR_DEPARTMENT_OTHER): Payer: Self-pay | Admitting: *Deleted

## 2023-06-03 NOTE — Progress Notes (Signed)
ED Antimicrobial Stewardship Positive Culture Follow Up   Tanner Griffin is an 82 y.o. male who presented to [facility] on 05/30/2023 with a chief complaint of  Chief Complaint  Patient presents with   Diarrhea   Abdominal Pain    Recent Results (from the past 720 hour(s))  Urine Culture     Status: Abnormal   Collection Time: 05/30/23 10:30 AM   Specimen: Urine, Clean Catch  Result Value Ref Range Status   Specimen Description   Final    URINE, CLEAN CATCH Performed at [facility], [address] [place], [place] [address]    Special Requests   Final    NONE Performed at [facility], [address] [place], [place] [address]    Culture >=100,000 COLONIES/mL KLEBSIELLA PNEUMONIAE (A)  Final   Report Status 06/02/2023 FINAL  Final   Organism ID, Bacteria KLEBSIELLA PNEUMONIAE (A)  Final      Susceptibility   Klebsiella pneumoniae - MIC*    AMPICILLIN RESISTANT Resistant     CEFAZOLIN <=4 SENSITIVE Sensitive     CEFEPIME <=0.12 SENSITIVE Sensitive     CEFTRIAXONE <=0.25 SENSITIVE Sensitive     CIPROFLOXACIN <=0.25 SENSITIVE Sensitive     GENTAMICIN <=1 SENSITIVE Sensitive     IMIPENEM <=0.25 SENSITIVE Sensitive     NITROFURANTOIN <=16 SENSITIVE Sensitive     TRIMETH/SULFA <=20 SENSITIVE Sensitive     AMPICILLIN/SULBACTAM 4 SENSITIVE Sensitive     PIP/TAZO <=4 SENSITIVE Sensitive     * >=100,000 COLONIES/mL KLEBSIELLA PNEUMONIAE    A: 82 y.o. male came in due to complaint of generalized abdominal pain + diarrhea for x6 days. No recent hospitalization or antibiotics. Urine culture grew >= 100,000 colonies/mL of klebsiella pneumoniae.Urine analysis: Nitrite: negative, leukocyte: small, bacteria: rare, squamous epithelial: 0-5.  PMH of chronic indwelling foley catheter. Has not been changed for past 40 days, suppose to be changes every 29 days.  P: Asymptomatic bacteriuria, symptoms not consistent with UTI. No treatment needed Call for  symptom check and instruct patient to follow up for further workup if no improvement  Attempted to call patient x1 with no success   ED Provider: Iantha Fallen, MD   Erasmo Leventhal, Pharm-D student  06/03/2023, 8:38 AM Clinical Pharmacist Monday - Friday phone -  [phone] Saturday - Sunday phone - [phone]

## 2023-06-03 NOTE — Telephone Encounter (Signed)
Post ED Visit - Positive Culture Follow-up: Unsuccessful Patient Follow-up  Culture assessed and recommendations reviewed by:  [x]  Sharin Mons, Pharm.D. []  Celedonio Miyamoto, Pharm.D., BCPS AQ-ID []  Garvin Fila, Pharm.D., BCPS []  Georgina Pillion, Pharm.D., BCPS []  [place], [place].D., BCPS, AAHIVP []  Estella Husk, Pharm.D., BCPS, AAHIVP []  Sherlynn Carbon, PharmD []  Pollyann Samples, PharmD, BCPS  Positive uirne culture  []  Patient discharged without antimicrobial prescription and treatment is now indicated []  Organism is resistant to prescribed ED discharge antimicrobial []  Patient with positive blood cultures  Plan:   No Treatment.  Needs symptom check with instruction to follow up for further work up if no improvement, Linwood Dibbles, MD  Unable to contact patient after 3 attempts, letter will be sent to address on file  Lysle Pearl 06/03/2023, 8:52 AM

## 2023-06-04 ENCOUNTER — Other Ambulatory Visit: Payer: Self-pay

## 2023-06-04 ENCOUNTER — Emergency Department (HOSPITAL_COMMUNITY)
Admission: EM | Admit: 2023-06-04 | Discharge: 2023-06-04 | Disposition: A | Discharge status: Home / Self Care | Payer: Medicare HMO | Attending: Emergency Medicine | Admitting: Emergency Medicine

## 2023-06-04 ENCOUNTER — Encounter (HOSPITAL_COMMUNITY): Payer: Self-pay | Admitting: *Deleted

## 2023-06-04 DIAGNOSIS — Y732 Prosthetic and other implants, materials and accessory gastroenterology and urology devices associated with adverse incidents: Secondary | ICD-10-CM | POA: Diagnosis not present

## 2023-06-04 DIAGNOSIS — T83091A Other mechanical complication of indwelling urethral catheter, initial encounter: Secondary | ICD-10-CM

## 2023-06-04 NOTE — ED Triage Notes (Signed)
Pt presents with foley cath not draining.

## 2023-06-04 NOTE — ED Provider Notes (Signed)
[place] EMERGENCY DEPARTMENT AT [facility] Provider Note   CSN: [id number] Arrival date & time: 06/04/23  [id number]     History {Add pertinent medical, surgical, social history, OB history to HPI:1} No chief complaint on file.   Tanner Griffin is a 82 y.o. male.  HPI     82 year old male with history of indwelling Foley catheter, hypertension, hyperlipidemia  Presented on June 10 with concern for diarrhea and abdominal pain.  He had CT that did not show acute pathology  Home Medications Prior to Admission medications   Medication Sig Start Date End Date Taking? Authorizing Provider  acetaminophen (TYLENOL) 500 MG tablet Take 1,000 mg by mouth every 6 (six) hours as needed for mild pain.    [provider]  alendronate (FOSAMAX) 70 MG tablet Take 1 tablet (70 mg total) by mouth every 7 (seven) days. Take with a full glass of water on an empty stomach. 05/19/22   Mliss Sax, MD  amLODipine (NORVASC) 10 MG tablet Take 1 tablet (10 mg total) by mouth daily. 05/19/22   Mliss Sax, MD  aspirin EC 81 MG tablet Take 81 mg by mouth daily. Swallow whole.    [provider]  atorvastatin (LIPITOR) 40 MG tablet Take 1 tablet (40 mg total) by mouth daily. 05/19/22   Mliss Sax, MD  bismuth subsalicylate (PEPTO-BISMOL) 262 MG chewable tablet Chew 2 tablets (524 mg total) by mouth as needed. Patient not taking: Reported on 08/11/2022 07/05/22   Mliss Sax, MD  cephALEXin (KEFLEX) 500 MG capsule Take 1 capsule (500 mg total) by mouth 4 (four) times daily. 02/25/23   Theron Arista, PA-C  cholecalciferol (VITAMIN D3) 25 MCG (1000 UNIT) tablet Take 1,000 Units by mouth daily.    [provider]  diclofenac Sodium (VOLTAREN) 1 % GEL Apply a small grape sized dollop to painful joints as needed. Patient taking differently: Apply 2 g topically daily as needed (pain). Apply a small grape sized dollop to painful joints as needed.  07/05/22   Mliss Sax, MD  loperamide (IMODIUM) 2 MG capsule Take 1 capsule (2 mg total) by mouth 4 (four) times daily as needed for diarrhea or loose stools. 05/30/23   Long, Arlyss Repress, MD  losartan (COZAAR) 25 MG tablet Take 1 tablet (25 mg total) by mouth daily. 08/14/22 11/12/22  Lorin Glass, MD  ondansetron (ZOFRAN-ODT) 4 MG disintegrating tablet Take 1 tablet (4 mg total) by mouth every 8 (eight) hours as needed for nausea or vomiting. 05/30/23   Long, Arlyss Repress, MD      Allergies    Telmisartan    Review of Systems   Review of Systems  Physical Exam Updated Vital Signs There were no vitals taken for this visit. Physical Exam  ED Results / Procedures / Treatments   Labs (all labs ordered are listed, but only abnormal results are displayed) Labs Reviewed - No data to display  EKG None  Radiology No results found.  Procedures Procedures  {Document cardiac monitor, telemetry assessment procedure when appropriate:1}  Medications Ordered in ED Medications - No data to display  ED Course/ Medical Decision Making/ A&P   {   Click here for ABCD2, HEART and other calculatorsREFRESH Note before signing :1}                          Medical Decision Making  ***  {Document critical care time when appropriate:1} {Document  review of labs and clinical decision tools ie heart score, Chads2Vasc2 etc:1}  {Document your independent review of radiology images, and any outside records:1} {Document your discussion with family members, caretakers, and with consultants:1} {Document social determinants of health affecting pt's care:1} {Document your decision making why or why not admission, treatments were needed:1} Final Clinical Impression(s) / ED Diagnoses Final diagnoses:  None    Rx / DC Orders ED Discharge Orders     None

## 2023-06-20 ENCOUNTER — Telehealth (HOSPITAL_BASED_OUTPATIENT_CLINIC_OR_DEPARTMENT_OTHER): Payer: Self-pay

## 2023-06-20 NOTE — Telephone Encounter (Signed)
Post ED Visit - Positive Culture Follow-up  Culture report reviewed by antimicrobial stewardship pharmacist: [facility] []  Enzo Bi, Pharm.D. []  Celedonio Miyamoto, Pharm.D., BCPS AQ-ID []  Garvin Fila, Pharm.D., BCPS []  Georgina Pillion, Pharm.D., BCPS []  [place], [address].D., BCPS, AAHIVP []  Estella Husk, Pharm.D., BCPS, AAHIVP []  Lysle Pearl, PharmD, BCPS []  Phillips Climes, PharmD, BCPS []  Agapito Games, PharmD, BCPS []  Verlan Friends, PharmD []  Mervyn Gay, PharmD, BCPS []  Vinnie Level, PharmD  [facility] [x]  Sharin Mons, PharmD []  Greer Pickerel, PharmD []  Adalberto Cole, PharmD []  Perlie Gold, Rph []  Lonell Face) Jean Rosenthal, PharmD []  Earl Many, PharmD []  Junita Push, PharmD []  Dorna Leitz, PharmD []  Terrilee Files, PharmD []  Lynann Beaver, PharmD []  Keturah Barre, PharmD []  Loralee Pacas, PharmD []  Bernadene Person, PharmD   Positive Urine culture Reviewed by ED provider Lynelle Doctor, MD Not treated,  Plan: call for symptoms check, if having symptoms follow up with provider.  Pt returned call, per pt not have any s/s  of UTI, instructed to follow up with provider if symptoms start. no further patient follow-up is required at this time.  Sandria Senter 06/20/2023, 9:12 AM

## 2023-11-04 ENCOUNTER — Emergency Department (HOSPITAL_COMMUNITY): Payer: Medicare HMO

## 2023-11-04 ENCOUNTER — Other Ambulatory Visit: Payer: Self-pay

## 2023-11-04 ENCOUNTER — Encounter (HOSPITAL_COMMUNITY): Payer: Self-pay | Admitting: Emergency Medicine

## 2023-11-04 ENCOUNTER — Emergency Department (HOSPITAL_COMMUNITY)
Admission: EM | Admit: 2023-11-04 | Discharge: 2023-11-05 | Disposition: A | Discharge status: Home / Self Care | Payer: Medicare HMO | Attending: Student | Admitting: Student

## 2023-11-04 DIAGNOSIS — R109 Unspecified abdominal pain: Secondary | ICD-10-CM | POA: Diagnosis not present

## 2023-11-04 DIAGNOSIS — Z7982 Long term (current) use of aspirin: Secondary | ICD-10-CM | POA: Insufficient documentation

## 2023-11-04 DIAGNOSIS — E119 Type 2 diabetes mellitus without complications: Secondary | ICD-10-CM | POA: Diagnosis not present

## 2023-11-04 DIAGNOSIS — R197 Diarrhea, unspecified: Secondary | ICD-10-CM | POA: Insufficient documentation

## 2023-11-04 DIAGNOSIS — Z8546 Personal history of malignant neoplasm of prostate: Secondary | ICD-10-CM | POA: Diagnosis not present

## 2023-11-04 LAB — CBC
HCT: 37.2 % — ABNORMAL LOW (ref 39.0–52.0)
Hemoglobin: 12.3 g/dL — ABNORMAL LOW (ref 13.0–17.0)
MCH: 33.1 pg (ref 26.0–34.0)
MCHC: 33.1 g/dL (ref 30.0–36.0)
MCV: 100 fL (ref 80.0–100.0)
Platelets: 253 10*3/uL (ref 150–400)
RBC: 3.72 MIL/uL — ABNORMAL LOW (ref 4.22–5.81)
RDW: 12.6 % (ref 11.5–15.5)
WBC: 5.5 10*3/uL (ref 4.0–10.5)
nRBC: 0 % (ref 0.0–0.2)

## 2023-11-04 LAB — URINALYSIS, ROUTINE W REFLEX MICROSCOPIC
Bacteria, UA: NONE SEEN
Bilirubin Urine: NEGATIVE
Glucose, UA: NEGATIVE mg/dL
Hgb urine dipstick: NEGATIVE
Ketones, ur: NEGATIVE mg/dL
Nitrite: POSITIVE — AB
Protein, ur: NEGATIVE mg/dL
Specific Gravity, Urine: 1.014 (ref 1.005–1.030)
pH: 5 (ref 5.0–8.0)

## 2023-11-04 LAB — COMPREHENSIVE METABOLIC PANEL
ALT: 15 U/L (ref 0–44)
AST: 31 U/L (ref 15–41)
Albumin: 3.7 g/dL (ref 3.5–5.0)
Alkaline Phosphatase: 82 U/L (ref 38–126)
Anion gap: 8 (ref 5–15)
BUN: 21 mg/dL (ref 8–23)
CO2: 22 mmol/L (ref 22–32)
Calcium: 8.8 mg/dL — ABNORMAL LOW (ref 8.9–10.3)
Chloride: 109 mmol/L (ref 98–111)
Creatinine, Ser: 0.93 mg/dL (ref 0.61–1.24)
GFR, Estimated: >60 mL/min (ref >60)
Glucose, Bld: 127 mg/dL — ABNORMAL HIGH (ref 70–99)
Potassium: 4.1 mmol/L (ref 3.5–5.1)
Sodium: 139 mmol/L (ref 135–145)
Total Bilirubin: 0.8 mg/dL (ref <1.2)
Total Protein: 6.8 g/dL (ref 6.5–8.1)

## 2023-11-04 LAB — LIPASE, BLOOD: Lipase: 30 U/L (ref 11–51)

## 2023-11-04 MED ORDER — IOHEXOL 300 MG/ML  SOLN
100.0000 mL | Freq: Once | INTRAMUSCULAR | Status: AC | PRN
Start: 2023-11-04 — End: 2023-11-04
  Administered 2023-11-04: 100 mL via INTRAVENOUS

## 2023-11-04 MED ORDER — SODIUM CHLORIDE 0.9 % IV BOLUS
500.0000 mL | Freq: Once | INTRAVENOUS | Status: AC
  Administered 2023-11-04: 500 mL via INTRAVENOUS

## 2023-11-04 NOTE — ED Provider Notes (Incomplete)
[place] EMERGENCY DEPARTMENT AT [facility] Provider Note   CSN: [id number] Arrival date & time: 11/04/23  1540     History {Add pertinent medical, surgical, social history, OB history to HPI:1} Chief Complaint  Patient presents with  . Diarrhea    Tanner Griffin is a 82 y.o. male.  Patient with history of T2DM, BPH with chronic indwelling foley catheter,    Diarrhea      Home Medications Prior to Admission medications   Medication Sig Start Date End Date Taking? Authorizing Provider  acetaminophen (TYLENOL) 500 MG tablet Take 1,000 mg by mouth every 6 (six) hours as needed for mild pain.    [provider]  alendronate (FOSAMAX) 70 MG tablet Take 1 tablet (70 mg total) by mouth every 7 (seven) days. Take with a full glass of water on an empty stomach. 05/19/22   Mliss Sax, MD  amLODipine (NORVASC) 10 MG tablet Take 1 tablet (10 mg total) by mouth daily. 05/19/22   Mliss Sax, MD  aspirin EC 81 MG tablet Take 81 mg by mouth daily. Swallow whole.    [provider]  atorvastatin (LIPITOR) 40 MG tablet Take 1 tablet (40 mg total) by mouth daily. 05/19/22   Mliss Sax, MD  bismuth subsalicylate (PEPTO-BISMOL) 262 MG chewable tablet Chew 2 tablets (524 mg total) by mouth as needed. Patient not taking: Reported on 08/11/2022 07/05/22   Mliss Sax, MD  cephALEXin (KEFLEX) 500 MG capsule Take 1 capsule (500 mg total) by mouth 4 (four) times daily. 02/25/23   Theron Arista, PA-C  cholecalciferol (VITAMIN D3) 25 MCG (1000 UNIT) tablet Take 1,000 Units by mouth daily.    [provider]  diclofenac Sodium (VOLTAREN) 1 % GEL Apply a small grape sized dollop to painful joints as needed. Patient taking differently: Apply 2 g topically daily as needed (pain). Apply a small grape sized dollop to painful joints as needed. 07/05/22   Mliss Sax, MD  loperamide (IMODIUM) 2 MG capsule Take 1 capsule (2  mg total) by mouth 4 (four) times daily as needed for diarrhea or loose stools. 05/30/23   Long, Arlyss Repress, MD  losartan (COZAAR) 25 MG tablet Take 1 tablet (25 mg total) by mouth daily. 08/14/22 11/12/22  Lorin Glass, MD  ondansetron (ZOFRAN-ODT) 4 MG disintegrating tablet Take 1 tablet (4 mg total) by mouth every 8 (eight) hours as needed for nausea or vomiting. 05/30/23   Long, Arlyss Repress, MD      Allergies    Telmisartan    Review of Systems   Review of Systems  Gastrointestinal:  Positive for diarrhea.    Physical Exam Updated Vital Signs BP (!) 186/89 (BP Location: Right Arm)   Pulse 65   Temp 98.3 F (36.8 C) (Oral)   Resp 19   Ht 5' 7.5" (1.715 m)   Wt 68 kg   SpO2 100%   BMI 23.15 kg/m  Physical Exam  ED Results / Procedures / Treatments   Labs (all labs ordered are listed, but only abnormal results are displayed) Labs Reviewed  COMPREHENSIVE METABOLIC PANEL - Abnormal; Notable for the following components:      Result Value   Glucose, Bld 127 (*)    Calcium 8.8 (*)    All other components within normal limits  URINALYSIS, ROUTINE W REFLEX MICROSCOPIC - Abnormal; Notable for the following components:   APPearance HAZY (*)    Nitrite POSITIVE (*)  Leukocytes,Ua LARGE (*)    All other components within normal limits  CBC - Abnormal; Notable for the following components:   RBC 3.72 (*)    Hemoglobin 12.3 (*)    HCT 37.2 (*)    All other components within normal limits  C DIFFICILE QUICK SCREEN W PCR REFLEX    GASTROINTESTINAL PANEL BY PCR, STOOL (REPLACES STOOL CULTURE)  URINE CULTURE  LIPASE, BLOOD    EKG None  Radiology CT ABDOMEN PELVIS W CONTRAST  Result Date: 11/04/2023 CLINICAL DATA:  Abdominal pain EXAM: CT ABDOMEN AND PELVIS WITH CONTRAST TECHNIQUE: Multidetector CT imaging of the abdomen and pelvis was performed using the standard protocol following bolus administration of intravenous contrast. RADIATION DOSE REDUCTION: This exam was performed  according to the departmental dose-optimization program which includes automated exposure control, adjustment of the mA and/or kV according to patient size and/or use of iterative reconstruction technique. CONTRAST:  OMNIPAQUE IOHEXOL 300 MG/ML  SOLN COMPARISON:  05/30/2023 FINDINGS: Lower chest: No acute abnormality Hepatobiliary: No suspicious focal hepatic abnormality. Gallbladder unremarkable. Pancreas: No focal abnormality or ductal dilatation. Spleen: No focal abnormality.  Normal size. Adrenals/Urinary Tract: No adrenal abnormality. No focal renal abnormality. No stones or hydronephrosis. Foley catheter in place with bladder decompressed. Stomach/Bowel: Stomach, large and small bowel grossly unremarkable. Moderate stool burden in the colon. Vascular/Lymphatic: Aortic atherosclerosis. No evidence of aneurysm or adenopathy. Reproductive: Radiation seeds in the region of the prostate. Other: No free fluid or free air. Musculoskeletal: Paget's disease of the pelvis again noted, unchanged. No acute bony abnormality. IMPRESSION: No acute findings in the abdomen or pelvis. Aortic atherosclerosis. Moderate stool burden in the colon. Electronically Signed   By: Charlett Nose M.D.   On: 11/04/2023 23:31    Procedures Procedures  {Document cardiac monitor, telemetry assessment procedure when appropriate:1}  Medications Ordered in ED Medications  sodium chloride 0.9 % bolus 500 mL (500 mLs Intravenous Bolus 11/04/23 2259)  iohexol (OMNIPAQUE) 300 MG/ML solution 100 mL (100 mLs Intravenous Contrast Given 11/04/23 2307)    ED Course/ Medical Decision Making/ A&P   {   Click here for ABCD2, HEART and other calculatorsREFRESH Note before signing :1}                              Medical Decision Making Amount and/or Complexity of Data Reviewed Labs: ordered. Radiology: ordered.  Risk Prescription drug management.   ***  {Document critical care time when appropriate:1} {Document review of labs  and clinical decision tools ie heart score, Chads2Vasc2 etc:1}  {Document your independent review of radiology images, and any outside records:1} {Document your discussion with family members, caretakers, and with consultants:1} {Document social determinants of health affecting pt's care:1} {Document your decision making why or why not admission, treatments were needed:1} Final Clinical Impression(s) / ED Diagnoses Final diagnoses:  None    Rx / DC Orders ED Discharge Orders     None

## 2023-11-04 NOTE — ED Triage Notes (Signed)
Patient arrives in wheelchair by POV c/o diarrhea x 2 weeks. States he has seen his PCP and was told to come here if persisted.

## 2023-11-04 NOTE — ED Notes (Signed)
Returned from CT.

## 2023-11-04 NOTE — ED Provider Notes (Signed)
[place] EMERGENCY DEPARTMENT AT [facility] Provider Note   CSN: [id number] Arrival date & time: 11/04/23  1540     History  Chief Complaint  Patient presents with   Diarrhea    Tanner Griffin is a 82 y.o. male.  Patient with history of T2DM, BPH and prostate cancer with chronic indwelling foley catheter presents today with complaints of diarrhea. He states that same began 2 weeks ago and has been persistent.  He notes that he has been having approximately 4 bouts of watery diarrhea daily.  States that his wife was recently sick with similar symptoms.  He has been trying Imodium with minimal relief.  He states he was seen and evaluated by his primary doctor and told to come here if his symptoms persisted.  He presents for same.  He denies any fevers or chills.  States he does have some stomach cramps as well but no specific pain. No history of abdominal surgeries. He is not on any antibiotics and denies any new medication changes.  He denies hematochezia or melena.  No nausea or vomiting.  The history is provided by the patient. No language interpreter was used.  Diarrhea      Home Medications Prior to Admission medications   Medication Sig Start Date End Date Taking? Authorizing Provider  acetaminophen (TYLENOL) 500 MG tablet Take 1,000 mg by mouth every 6 (six) hours as needed for mild pain.    [provider]  alendronate (FOSAMAX) 70 MG tablet Take 1 tablet (70 mg total) by mouth every 7 (seven) days. Take with a full glass of water on an empty stomach. 05/19/22   Mliss Sax, MD  amLODipine (NORVASC) 10 MG tablet Take 1 tablet (10 mg total) by mouth daily. 05/19/22   Mliss Sax, MD  aspirin EC 81 MG tablet Take 81 mg by mouth daily. Swallow whole.    [provider]  atorvastatin (LIPITOR) 40 MG tablet Take 1 tablet (40 mg total) by mouth daily. 05/19/22   Mliss Sax, MD  bismuth subsalicylate (PEPTO-BISMOL) 262  MG chewable tablet Chew 2 tablets (524 mg total) by mouth as needed. Patient not taking: Reported on 08/11/2022 07/05/22   Mliss Sax, MD  cephALEXin (KEFLEX) 500 MG capsule Take 1 capsule (500 mg total) by mouth 4 (four) times daily. 02/25/23   Theron Arista, PA-C  cholecalciferol (VITAMIN D3) 25 MCG (1000 UNIT) tablet Take 1,000 Units by mouth daily.    [provider]  diclofenac Sodium (VOLTAREN) 1 % GEL Apply a small grape sized dollop to painful joints as needed. Patient taking differently: Apply 2 g topically daily as needed (pain). Apply a small grape sized dollop to painful joints as needed. 07/05/22   Mliss Sax, MD  loperamide (IMODIUM) 2 MG capsule Take 1 capsule (2 mg total) by mouth 4 (four) times daily as needed for diarrhea or loose stools. 05/30/23   Long, Arlyss Repress, MD  losartan (COZAAR) 25 MG tablet Take 1 tablet (25 mg total) by mouth daily. 08/14/22 11/12/22  Lorin Glass, MD  ondansetron (ZOFRAN-ODT) 4 MG disintegrating tablet Take 1 tablet (4 mg total) by mouth every 8 (eight) hours as needed for nausea or vomiting. 05/30/23   Long, Arlyss Repress, MD      Allergies    Telmisartan    Review of Systems   Review of Systems  Gastrointestinal:  Positive for diarrhea.  All other systems reviewed and are negative.   Physical  Exam Updated Vital Signs BP (!) 186/89 (BP Location: Right Arm)   Pulse 65   Temp 98.3 F (36.8 C) (Oral)   Resp 19   Ht 5' 7.5" (1.715 m)   Wt 68 kg   SpO2 100%   BMI 23.15 kg/m  Physical Exam Vitals and nursing note reviewed.  Constitutional:      General: He is not in acute distress.    Appearance: Normal appearance. He is normal weight. He is not ill-appearing, toxic-appearing or diaphoretic.  HENT:     Head: Normocephalic and atraumatic.  Cardiovascular:     Rate and Rhythm: Normal rate.  Pulmonary:     Effort: Pulmonary effort is normal. No respiratory distress.  Abdominal:     General: Abdomen is flat.      Palpations: Abdomen is soft.     Tenderness: There is no abdominal tenderness.     Comments: Foley catheter in place with clear non-infectious urine in the bag  Musculoskeletal:        General: Normal range of motion.     Cervical back: Normal range of motion.  Skin:    General: Skin is warm and dry.  Neurological:     General: No focal deficit present.     Mental Status: He is alert.  Psychiatric:        Mood and Affect: Mood normal.        Behavior: Behavior normal.     ED Results / Procedures / Treatments   Labs (all labs ordered are listed, but only abnormal results are displayed) Labs Reviewed  COMPREHENSIVE METABOLIC PANEL - Abnormal; Notable for the following components:      Result Value   Glucose, Bld 127 (*)    Calcium 8.8 (*)    All other components within normal limits  URINALYSIS, ROUTINE W REFLEX MICROSCOPIC - Abnormal; Notable for the following components:   APPearance HAZY (*)    Nitrite POSITIVE (*)    Leukocytes,Ua LARGE (*)    All other components within normal limits  CBC - Abnormal; Notable for the following components:   RBC 3.72 (*)    Hemoglobin 12.3 (*)    HCT 37.2 (*)    All other components within normal limits  C DIFFICILE QUICK SCREEN W PCR REFLEX    GASTROINTESTINAL PANEL BY PCR, STOOL (REPLACES STOOL CULTURE)  URINE CULTURE  LIPASE, BLOOD    EKG None  Radiology CT ABDOMEN PELVIS W CONTRAST  Result Date: 11/04/2023 CLINICAL DATA:  Abdominal pain EXAM: CT ABDOMEN AND PELVIS WITH CONTRAST TECHNIQUE: Multidetector CT imaging of the abdomen and pelvis was performed using the standard protocol following bolus administration of intravenous contrast. RADIATION DOSE REDUCTION: This exam was performed according to the departmental dose-optimization program which includes automated exposure control, adjustment of the mA and/or kV according to patient size and/or use of iterative reconstruction technique. CONTRAST:  OMNIPAQUE IOHEXOL 300 MG/ML   SOLN COMPARISON:  05/30/2023 FINDINGS: Lower chest: No acute abnormality Hepatobiliary: No suspicious focal hepatic abnormality. Gallbladder unremarkable. Pancreas: No focal abnormality or ductal dilatation. Spleen: No focal abnormality.  Normal size. Adrenals/Urinary Tract: No adrenal abnormality. No focal renal abnormality. No stones or hydronephrosis. Foley catheter in place with bladder decompressed. Stomach/Bowel: Stomach, large and small bowel grossly unremarkable. Moderate stool burden in the colon. Vascular/Lymphatic: Aortic atherosclerosis. No evidence of aneurysm or adenopathy. Reproductive: Radiation seeds in the region of the prostate. Other: No free fluid or free air. Musculoskeletal: Paget's disease of the pelvis again  noted, unchanged. No acute bony abnormality. IMPRESSION: No acute findings in the abdomen or pelvis. Aortic atherosclerosis. Moderate stool burden in the colon. Electronically Signed   By: Charlett Nose M.D.   On: 11/04/2023 23:31    Procedures Procedures    Medications Ordered in ED Medications  simethicone (MYLICON) chewable tablet 80 mg (has no administration in time range)  dicyclomine (BENTYL) capsule 10 mg (has no administration in time range)  sodium chloride 0.9 % bolus 500 mL (500 mLs Intravenous Bolus 11/04/23 2259)  iohexol (OMNIPAQUE) 300 MG/ML solution 100 mL (100 mLs Intravenous Contrast Given 11/04/23 2307)    ED Course/ Medical Decision Making/ A&P                                 Medical Decision Making Amount and/or Complexity of Data Reviewed Labs: ordered. Radiology: ordered.  Risk Prescription drug management.   This patient is a 82 y.o. male who presents to the ED for concern of diarrhea, this involves an extensive number of treatment options, and is a complaint that carries with it a high risk of complications and morbidity. The emergent differential diagnosis prior to evaluation includes, but is not limited to,  infectious diarrhea,  inflammatory, colitis, diverticulitis, constipation causing overflow diarhrea . This is not an exhaustive differential.   Past Medical History / Co-morbidities / Social History:  has a past medical history of Arthritis, Arthrofibrosis of total knee arthroplasty, right (03/26/2014), Constipation, Headache, HYPERLIPIDEMIA, HYPERTENSION, Joint pain, Joint swelling, Nocturia, Osteoarthritis of left knee (03/26/2014), Pneumonia, Prostate cancer (HCC), Urinary frequency, Wears eyeglasses, and Wears partial dentures.  Additional history: Chart reviewed.  Physical Exam: Physical exam performed. The pertinent findings include: abdomen soft nontender.  Patient well-appearing.  Foley catheter in place.  Lab Tests: I ordered, and personally interpreted labs.  The pertinent results include:  UA with nitrites, however no bacteria. Sample drawn off patients foley, culture pending. Will hold abx for now. No other acute laboratory abnormalities   Imaging Studies: I ordered imaging studies including Ct abdomen pelvis. I independently visualized and interpreted imaging which showed   No acute findings in the abdomen or pelvis.   Aortic atherosclerosis.   Moderate stool burden in the colon.  I agree with the radiologist interpretation.   Medications: I ordered medication including simethicone, bentyl  for abdominal cramping. Reevaluation of the patient after these medicines showed that the patient improved. I have reviewed the patients home medicines and have made adjustments as needed.   Disposition: After consideration of the diagnostic results and the patients response to treatment, I feel that emergency department workup does not suggest an emergent condition requiring admission or immediate intervention beyond what has been performed at this time. The plan is: discharge with close outpatient follow-up and return precautions.  Suspect patient's symptoms are due to gastroenteritis, likely viral given his  wife with similar symptoms.  Patient was here for 9 hours and was unable to provide a stool sample, therefore notes concern for emergent etiology such as C. difficile.  Will give prescription for Metamucil and recommend the patient continue to take his Imodium and follow-up closely with his primary doctor.  Evaluation and diagnostic testing in the emergency department does not suggest an emergent condition requiring admission or immediate intervention beyond what has been performed at this time.  Plan for discharge with close PCP follow-up.  Patient is understanding and amenable with plan, educated on red flag  symptoms that would prompt immediate return.  Patient discharged in stable condition.   I discussed this case with my attending physician Dr. Posey Rea who cosigned this note including patient's presenting symptoms, physical exam, and planned diagnostics and interventions. Attending physician stated agreement with plan or made changes to plan which were implemented.    Final Clinical Impression(s) / ED Diagnoses Final diagnoses:  Diarrhea, unspecified type    Rx / DC Orders ED Discharge Orders          Ordered    psyllium (METAMUCIL SMOOTH TEXTURE) 58.6 % powder  3 times daily        11/05/23 0033          An After Visit Summary was printed and given to the patient.     Silva Bandy, PA-C 11/05/23 0034    Glendora Score, MD 11/05/23 [phone]

## 2023-11-05 MED ORDER — DICYCLOMINE HCL 10 MG PO CAPS
10.0000 mg | ORAL_CAPSULE | Freq: Once | ORAL | Status: AC
  Administered 2023-11-05: 10 mg via ORAL
  Filled 2023-11-05: qty 1

## 2023-11-05 MED ORDER — SIMETHICONE 80 MG PO CHEW
80.0000 mg | CHEWABLE_TABLET | Freq: Once | ORAL | Status: AC
  Administered 2023-11-05: 80 mg via ORAL
  Filled 2023-11-05: qty 1

## 2023-11-05 MED ORDER — METAMUCIL SMOOTH TEXTURE 58.6 % PO POWD: 1.0000 | Freq: Three times a day (TID) | ORAL | 12 refills | Status: AC

## 2023-11-05 NOTE — Discharge Instructions (Signed)
As we discussed, your workup in the ER today was reassuring for acute findings.  Laboratory evaluation and CT imaging did not reveal any emergent cause of your symptoms.  You may continue to take the Imodium that you are prescribed every day.  I have also given you a prescription for Metamucil which is a stool bulking agent to help as well.  Please take this every day as prescribed.  You may also take things such as Gas-X which can get over-the-counter to help with stomach cramps.  I have also attached some food recommendations to try to help relieve your diarrhea as well.  Please call your primary doctor to schedule an appointment at your earliest convenience.  Return if development of any new or worsening symptoms.

## 2023-11-07 LAB — URINE CULTURE: Culture: >=100000 — AB

## 2023-11-08 ENCOUNTER — Telehealth (HOSPITAL_BASED_OUTPATIENT_CLINIC_OR_DEPARTMENT_OTHER): Payer: Self-pay | Admitting: *Deleted

## 2023-11-08 NOTE — Progress Notes (Signed)
ED Antimicrobial Stewardship Positive Culture Follow Up   Tanner Griffin is an 82 y.o. male who presented to [facility] on 11/04/2023 with a chief complaint of  Chief Complaint  Patient presents with   Diarrhea    Recent Results (from the past 720 hour(s))  Urine Culture     Status: Abnormal   Collection Time: 11/04/23 11:19 PM   Specimen: Urine, Catheterized  Result Value Ref Range Status   Specimen Description   Final    URINE, CATHETERIZED Performed at [facility], [address] [place], [place] [address]    Special Requests   Final    NONE Performed at [facility], [address] [place], [place] [address]    Culture (A)  Final    >=100,000 COLONIES/mL ENTEROBACTER CLOACAE >=100,000 COLONIES/mL KLEBSIELLA PNEUMONIAE    Report Status 11/07/2023 FINAL  Final   Organism ID, Bacteria KLEBSIELLA PNEUMONIAE (A)  Final   Organism ID, Bacteria ENTEROBACTER CLOACAE (A)  Final      Susceptibility   Enterobacter cloacae - MIC*    CEFEPIME <=0.12 SENSITIVE Sensitive     CIPROFLOXACIN <=0.25 SENSITIVE Sensitive     GENTAMICIN <=1 SENSITIVE Sensitive     IMIPENEM 0.5 SENSITIVE Sensitive     NITROFURANTOIN 64 INTERMEDIATE Intermediate     TRIMETH/SULFA <=20 SENSITIVE Sensitive     PIP/TAZO <=4 SENSITIVE Sensitive ug/mL    * >=100,000 COLONIES/mL ENTEROBACTER CLOACAE   Klebsiella pneumoniae - MIC*    AMPICILLIN RESISTANT Resistant     CEFAZOLIN <=4 SENSITIVE Sensitive     CEFEPIME <=0.12 SENSITIVE Sensitive     CEFTRIAXONE <=0.25 SENSITIVE Sensitive     CIPROFLOXACIN <=0.25 SENSITIVE Sensitive     GENTAMICIN <=1 SENSITIVE Sensitive     IMIPENEM <=0.25 SENSITIVE Sensitive     NITROFURANTOIN <=16 SENSITIVE Sensitive     TRIMETH/SULFA <=20 SENSITIVE Sensitive     AMPICILLIN/SULBACTAM 4 SENSITIVE Sensitive     PIP/TAZO <=4 SENSITIVE Sensitive ug/mL    * >=100,000 COLONIES/mL KLEBSIELLA PNEUMONIAE    Patient diagnosed with viral  gastroenteritis. No antibiotics given at discharge.   No urinary symptoms, WBC WNL, afebrile. Pt with chronic Foley, urine culture obtained from Foley per documentation. Urine culture represents colonization, no antibiotics warranted.   ED Provider: Sabra Heck, PA-C   Jodelle Red Lonzo Saulter 11/08/2023, 10:15 AM Clinical Pharmacist

## 2023-11-08 NOTE — Telephone Encounter (Signed)
Post ED Visit - Positive Culture Follow-up  Culture report reviewed by antimicrobial stewardship pharmacist: [facility] []  [address] Pharm.D. []  Celedonio Miyamoto, Pharm.D., BCPS AQ-ID []  Garvin Fila, Pharm.D., BCPS []  Georgina Pillion, Pharm.D., BCPS []  [place], [address].D., BCPS, AAHIVP []  Estella Husk, Pharm.D., BCPS, AAHIVP []  Lysle Pearl, PharmD, BCPS []  Phillips Climes, PharmD, BCPS []  Agapito Games, PharmD, BCPS []  Verlan Friends, PharmD []  Mervyn Gay, PharmD, BCPS []  Vinnie Level, PharmD  [facility] [x]  Keturah Barre PharmD []  Greer Pickerel, PharmD []  Adalberto Cole, PharmD []  Perlie Gold, Rph []  Lonell Face) Jean Rosenthal, PharmD []  Earl Many, PharmD []  Junita Push, PharmD []  Dorna Leitz, PharmD []  Terrilee Files, PharmD []  Lynann Beaver, PharmD []  Keturah Barre, PharmD []  Loralee Pacas, PharmD []  Bernadene Person, PharmD   Positive urine culture Treated with no antibiotics, urine obtained from foley . No urinary symptoms likely colonization per Lauren B PA-Cr  No patient follow-up is required at this time.  Nena Polio Garner Nash 11/08/2023, 11:18 AM

## 2023-11-10 NOTE — Progress Notes (Unsigned)
Cardiology Office Note:  .   Date:  11/13/2023  ID:  Donneta Romberg, DOB 02/22/1941, MRN [id number] PCP: Sharmon Revere, MD  [facility] Providers Cardiologist:  Bryan Lemma, MD     Chief Complaint  Patient presents with   New Patient (Initial Visit)    Referred for dizziness.  PCP note indicates asymptomatic congestive heart failure?    Patient Profile: .     BERTIL SKATES is a mildly demented 82 y.o. male  with a PMH notable for HTN (typically whitecoat hypertension), DM-2 with CKD 3A (diabetic angiopathy), Wenkebach block, PAD, chronic venous insufficiency "asymptomatic heart failure" who presents here for evaluation of Dizziness at the request of Sharmon Revere, MD.    Donneta Romberg was evaluated with a 2-week Zio patch monitor June to July of 2024 revealing intermittent Wenckebach block, 3-second pause and a short atrial run.  Previous echocardiogram reportedly showed mildly reduced RV function and mild MR along with LVH..  Subjective  Discussed the use of AI scribe software for clinical note transcription with the patient, who gave verbal consent to proceed.  History of Present Illness   The patient, an 82 year old individual with a history of hypertension and knee replacements, was referred to cardiology due to concerns of dizziness and falls. However, the patient denies experiencing dizziness and attributes the falls to balance issues and weak knees. The patient also reports occasional fatigue after prolonged walking, which he attributes to his "bad knees."  The patient has been experiencing swelling in both legs, which he believes is a result of his knee replacements. He has been managing this with a diuretic medication, although he reports running out of his "water pills." The patient also reports a recent episode of diarrhea, which led to a hospital visit due to concerns about high blood pressure.  The patient has minimal history of heart-related issues,  with a heart ultrasound in January revealing an ejection fraction of 50-55%, which is on the lower end of the normal range. The patient's heart monitor showed occasional skipped beats and a brief episode of atrial tachycardia.  The patient's medication regimen is unclear, with discrepancies between the patient's report and the medical records. The patient reports taking a blood pressure medication, a "baby pill," and another unidentified medication from a small bottle. The patient's caregiver assists with medication management but is also unsure about the specifics of the medication regimen.  The patient has experienced weight loss, which he attributes to eating only one meal a day. He reports a weight decrease from 176 lbs to 140 lbs. The patient's caregiver has been encouraging him to eat more to regain some weight and muscle mass - has regained ~ 8 lb in last ~ month with increased PO intake.  In summary, the patient presents with a history of falls attributed to balance issues and weak knees, leg swelling managed with diuretics, and a history of heart-related issues. The patient denies experiencing dizziness or significant shortness of breath. The patient's medication regimen is unclear and requires clarification. The patient has also experienced significant weight loss, which is a concern for his overall health.      ROS:  Review of Systems - Negative except as noted above    Objective   PMH notable for: Hypertension-thought to be exacerbated by whitecoat hypertension DM-2 with diabetic angiopathy CKD 3 A (GFR estimated 35)-secondary hyperparathyroidism Wenkebach block Chronic venous insufficiency Dementia Unsteady gait related to lumbar spinal stenosis ?  Asymptomatic heart failure (which  is not a diagnosis) Urgent continence with indwelling urethral catheter History of malignant prostate cancer History of pressure ulcer  Current Meds per our list, but unable to confirm.   Medication Sig   acetaminophen (TYLENOL) 500 MG tablet Take 1,000 mg by mouth every 6 (six) hours as needed for mild pain.   alendronate (FOSAMAX) 70 MG tablet Take 1 tablet (70 mg total) by mouth every 7 (seven) days. Take with a full glass of water on an empty stomach.   amLODipine (NORVASC) 10 MG tablet Take 1 tablet (10 mg total) by mouth daily.   aspirin EC 81 MG tablet Take 81 mg by mouth daily. Swallow whole.   atorvastatin (LIPITOR) 40 MG tablet Take 1 tablet (40 mg total) by mouth daily.   bismuth subsalicylate (PEPTO-BISMOL) 262 MG chewable tablet Chew 2 tablets (524 mg total) by mouth as needed.   cephALEXin (KEFLEX) 500 MG capsule Take 1 capsule (500 mg total) by mouth 4 (four) times daily.   cholecalciferol (VITAMIN D3) 25 MCG (1000 UNIT) tablet Take 1,000 Units by mouth daily.   diclofenac Sodium (VOLTAREN) 1 % GEL Apply a small grape sized dollop to painful joints as needed. (Patient taking differently: Apply 2 g topically daily as needed (pain). Apply a small grape sized dollop to painful joints as needed.)   loperamide (IMODIUM) 2 MG capsule Take 1 capsule (2 mg total) by mouth 4 (four) times daily as needed for diarrhea or loose stools.   ondansetron (ZOFRAN-ODT) 4 MG disintegrating tablet Take 1 tablet (4 mg total) by mouth every 8 (eight) hours as needed for nausea or vomiting.   psyllium (METAMUCIL SMOOTH TEXTURE) 58.6 % powder Take 1 packet by mouth 3 (three) times daily.   tamsulosin (FLOMAX) 0.4 MG CAPS capsule Take 0.4 mg by mouth daily.   torsemide (DEMADEX) 5 MG tablet Take 5 mg by mouth daily.  PCP med list: Alendronate 70 mg weekly, aspirin 81 mg, atorvastatin 40 mg daily, torsemide 5 mg daily, losartan-HCTZ 50-12.5 mg daily; alendronate 70 mg weekly, clobetasol topical cream to low back daily, as needed Imodium,.  MiraLAX, as needed senna and as needed Voltaren gel. => Apparently hydrochlorothiazide stopped  ? NOT sure about Amlodipine-listed on our list but not on  his PCPs list  Given advanced age, no pertinent PMH. Non-smoker, nondrinker.  Walks with a rolling walker.  Lives with spouse.   Studies Reviewed: [name]   EKG Interpretation Date/Time:  Friday November 11 2023 09:58:30 EST Ventricular Rate:  53 PR Interval:  216 QRS Duration:  70 QT Interval:  426 QTC Calculation: 399 R Axis:   -49  Text Interpretation: Sinus bradycardia with marked sinus arrhythmia with 1st degree A-V block Cannot rule out Premature atrial complexes in a pattern of bigeminy Left axis deviation Minimal voltage criteria for LVH, may be normal variant ( Sokolow-Lyon ) Inferior infarct , age undetermined When compared with ECG of 09-Aug-2022 21:56, Rate slower with sinus arrhythmia vs Premature atrial complexes in a pattern of bigeminy NOW PRESENT Confirmed by Bryan Lemma ([id number]) on 11/13/2023 5:06:11 PM    From clinic note: Echo done in 2023 at OSH: Mildly dilated RV and RA with LVH.  Mildly reduced RV function.  Mitral valve thickening with mild MR.  Report not available . Zio patch monitor June-July 2024:: 31 short bursts of atrial tachycardia, Wenckebach block, one 3-second pause  Predominant rhythm sinus with a rate range of 47 to 112 bpm.  Average 66 bpm.  Occasional PACs (3%)  otherwise rare PVCs, couplets and triplets.  1 pause of 3 seconds and 1 episode of atrial tachycardia with variable block lasting 9 beats at the rate of 98 bpm.  Wenkebach block noted.  Pause was at 6:48 AM.  Not symptomatic.  Echocardiogram: Ejection fraction 50-55%, mild concentric LVH, mild RV and RA dilation, mild mitral regurgitation (may be underestimated due to visualization), mild aortic valve calcification (12/2022)  Myoview 05/11/2012: No ischemia or infarction.  Normal EF  Labs 05/31/2012 Na+ 139, K+ 4.2, Cl- 101, HCO3-28, BUN 34, Cr 1.92, Glu 9, Ca2+ 9.6; total protein 7.2, globulin 3.1 (ratio 1.3).  T. bili 0.9 AST 23, ALT 16, AlkP 91; A1c (POC) 5.4; PSA 0.14.  Vitamin D 39 CBC: W 6.0,  H/H 11.9/36.0, Plt 247  Risk Assessment/Calculations:         Physical Exam:   VS:  BP 118/62 (BP Location: Left Arm, Patient Position: Sitting, Cuff Size: Normal)   Pulse (!) 53   Ht 5\' 8"  (1.727 m)   Wt 148 lb (67.1 kg)   SpO2 97%   BMI 22.50 kg/m    Wt Readings from Last 3 Encounters:  11/11/23 148 lb (67.1 kg)  11/04/23 150 lb (68 kg)  06/04/23 150 lb (68 kg)    GEN: Somewhat thin and frail elderly gentleman.  Seems to be in no acute distress; has a faint smell of urine-with indwelling Foley catheter.  Wife and MA both note prominence of ribs-sticking out ".  Not a very good historian.  His wife tries answer questions along with him but neither 1 are great historians.  He is awake and alert x 3 but poor historian NECK: No JVD; No carotid bruits CARDIAC: RRR with intermittent ectopy,Normal S1, S2;  no murmurs, rubs, gallops RESPIRATORY:  Clear to auscultation without rales, wheezing or rhonchi ; nonlabored, good air movement. ABDOMEN: Soft, non-tender, non-distended EXTREMITIES:  No edema; bilateral hand deformity related to arthritis.  Walks with a rolling walker. Indwelling urethral catheter.     ASSESSMENT AND PLAN: .    Problem List Items Addressed This Visit       Cardiology Problems   Essential hypertension (Chronic)    Unaware of what he is actually taking for blood pressure control.  By last note, still on losartan-HCTZ and torsemide however there is suggestion that HCTZ was discontinued.  Not sure if he is or is not on amlodipine.  Does not appear that he is on it by PCP's note.No symptoms of hypotension reported. -Continue current antihypertensive regimen. -Monitor blood pressure at home.      Relevant Orders   EKG 12-Lead (Completed)   Hyperlipidemia (Chronic)    Historically, but not on any therapy for it.  Remains on atorvastatin 40 mg daily.   Most recent lipid panel not available       Mobitz type I Wenckebach atrioventricular block - Primary (Chronic)     Noted Wenckebach block, short 3-second pause, and brief atrial tachycardia on monitor.  No symptoms of syncope or pre-syncope reported. -No intervention needed at this time. -With baseline bradycardia, would avoid AV nodal agents -If symptoms of syncope or pre-syncope develop, consider repeat monitoring and possible pacemaker placement.        Other   Bilateral lower extremity edema    Likely secondary to venous insufficiency post knee replacement surgery, and as well as deconditioning, muscle wasting and mild malnutrition. Currently using support socks and on Torsemide as needed. -Continue use of compression stockings and Torsemide  as needed for swelling.      Dizziness, nonspecific    Referred for dizziness, although the patient and his wife deny any real dizziness associated with his falls, they seem to be more mechanical falls and loss of balance. Event monitor suggested Wenkebach block, brief runs of atrial tachycardia and one 3-second pause.  I do not think either 1 of these are a reason for his dizziness.      Frequent falls    Frequent falls without preceding dizziness. Likely due to balance issues related to lumbar spinal stenosis, and weak knees. No loss of consciousness or head trauma reported.  No sense of any rapid irregular beats palpitations.  Does not seem to be orthostatic. -Continue use of walker for mobility and safety. -Consider physical therapy for balance and strength training.      Loose stools   Relevant Orders   EKG 12-Lead (Completed)   Type II diabetes mellitus, well controlled (HCC) (Chronic)    Most recent A1c was 5.4.  Probably made easier to control based on his weight loss and poor appetite.  Not currently on anything for diabetes.      Relevant Orders   EKG 12-Lead (Completed)     General Health Maintenance -Encourage increased caloric intake to prevent further weight loss. -Ensure all healthcare providers have an updated medication list.  -  Recommend carrying a list of current medications to all appointments.  - Avoid AV Nodal Agents for       Follow-Up: Return for Followup when necessary.  Total time spent: 31 min spent with patient + 32 min spent charting = 63 min    Signed, Marykay Lex, MD, MS Bryan Lemma, M.D., M.S. Interventional Cardiologist  [facility]  Pager # [phone] Phone # [phone] [address] [place], [place] [address]

## 2023-11-11 ENCOUNTER — Ambulatory Visit: Payer: Medicare HMO | Attending: Cardiology | Admitting: Cardiology

## 2023-11-11 ENCOUNTER — Encounter: Payer: Self-pay | Admitting: Cardiology

## 2023-11-11 VITALS — BP 118/62 | HR 53 | Ht 68.0 in | Wt 148.0 lb

## 2023-11-11 DIAGNOSIS — R296 Repeated falls: Secondary | ICD-10-CM

## 2023-11-11 DIAGNOSIS — E119 Type 2 diabetes mellitus without complications: Secondary | ICD-10-CM | POA: Diagnosis not present

## 2023-11-11 DIAGNOSIS — I441 Atrioventricular block, second degree: Secondary | ICD-10-CM

## 2023-11-11 DIAGNOSIS — E7849 Other hyperlipidemia: Secondary | ICD-10-CM

## 2023-11-11 DIAGNOSIS — I10 Essential (primary) hypertension: Secondary | ICD-10-CM

## 2023-11-11 DIAGNOSIS — R42 Dizziness and giddiness: Secondary | ICD-10-CM

## 2023-11-11 DIAGNOSIS — R195 Other fecal abnormalities: Secondary | ICD-10-CM | POA: Diagnosis not present

## 2023-11-11 DIAGNOSIS — R600 Localized edema: Secondary | ICD-10-CM

## 2023-11-11 NOTE — Patient Instructions (Signed)
Medication Instructions:  No changes *If you need a refill on your cardiac medications before your next appointment, please call your pharmacy*   Lab Work: None needed   Testing/Procedures: None needed    Follow-Up: At [facility], you and your health needs are our priority.  As part of our continuing mission to provide you with exceptional heart care, we have created designated Provider Care Teams.  These Care Teams include your primary Cardiologist (physician) and Advanced Practice Providers (APPs -  Physician Assistants and Nurse Practitioners) who all work together to provide you with the care you need, when you need it.    Your next appointment:   Follow up as needed   Provider:   Bryan Lemma, MD     Other Instructions Be sure to get a copy of your med list when you visit a Doctor

## 2023-11-13 ENCOUNTER — Encounter: Payer: Self-pay | Admitting: Cardiology

## 2023-11-13 DIAGNOSIS — R42 Dizziness and giddiness: Secondary | ICD-10-CM | POA: Insufficient documentation

## 2023-11-13 DIAGNOSIS — I441 Atrioventricular block, second degree: Secondary | ICD-10-CM | POA: Insufficient documentation

## 2023-11-13 DIAGNOSIS — R296 Repeated falls: Secondary | ICD-10-CM | POA: Insufficient documentation

## 2023-11-13 DIAGNOSIS — R600 Localized edema: Secondary | ICD-10-CM | POA: Insufficient documentation

## 2023-11-13 NOTE — Assessment & Plan Note (Signed)
Most recent A1c was 5.4.  Probably made easier to control based on his weight loss and poor appetite.  Not currently on anything for diabetes.

## 2023-11-13 NOTE — Assessment & Plan Note (Signed)
Unaware of what he is actually taking for blood pressure control.  By last note, still on losartan-HCTZ and torsemide however there is suggestion that HCTZ was discontinued.  Not sure if he is or is not on amlodipine.  Does not appear that he is on it by PCP's note.No symptoms of hypotension reported. -Continue current antihypertensive regimen. -Monitor blood pressure at home.

## 2023-11-13 NOTE — Assessment & Plan Note (Signed)
Frequent falls without preceding dizziness. Likely due to balance issues related to lumbar spinal stenosis, and weak knees. No loss of consciousness or head trauma reported.  No sense of any rapid irregular beats palpitations.  Does not seem to be orthostatic. -Continue use of walker for mobility and safety. -Consider physical therapy for balance and strength training.

## 2023-11-13 NOTE — Assessment & Plan Note (Signed)
Noted Wenckebach block, short 3-second pause, and brief atrial tachycardia on monitor.  No symptoms of syncope or pre-syncope reported. -No intervention needed at this time. -With baseline bradycardia, would avoid AV nodal agents -If symptoms of syncope or pre-syncope develop, consider repeat monitoring and possible pacemaker placement.

## 2023-11-13 NOTE — Assessment & Plan Note (Signed)
Historically, but not on any therapy for it.  Remains on atorvastatin 40 mg daily.   Most recent lipid panel not available

## 2023-11-13 NOTE — Assessment & Plan Note (Signed)
Referred for dizziness, although the patient and his wife deny any real dizziness associated with his falls, they seem to be more mechanical falls and loss of balance. Event monitor suggested Wenkebach block, brief runs of atrial tachycardia and one 3-second pause.  I do not think either 1 of these are a reason for his dizziness.

## 2023-11-13 NOTE — Assessment & Plan Note (Signed)
Likely secondary to venous insufficiency post knee replacement surgery, and as well as deconditioning, muscle wasting and mild malnutrition. Currently using support socks and on Torsemide as needed. -Continue use of compression stockings and Torsemide as needed for swelling.

## 2023-12-29 ENCOUNTER — Emergency Department (HOSPITAL_COMMUNITY)
Admission: EM | Admit: 2023-12-29 | Discharge: 2023-12-29 | Disposition: A | Discharge status: Home / Self Care | Payer: Medicare HMO | Attending: Emergency Medicine | Admitting: Emergency Medicine

## 2023-12-29 ENCOUNTER — Emergency Department (HOSPITAL_COMMUNITY): Payer: Medicare HMO

## 2023-12-29 DIAGNOSIS — R5383 Other fatigue: Secondary | ICD-10-CM | POA: Diagnosis present

## 2023-12-29 DIAGNOSIS — I10 Essential (primary) hypertension: Secondary | ICD-10-CM | POA: Insufficient documentation

## 2023-12-29 DIAGNOSIS — S0990XA Unspecified injury of head, initial encounter: Secondary | ICD-10-CM | POA: Insufficient documentation

## 2023-12-29 DIAGNOSIS — Z79899 Other long term (current) drug therapy: Secondary | ICD-10-CM | POA: Insufficient documentation

## 2023-12-29 DIAGNOSIS — Z7982 Long term (current) use of aspirin: Secondary | ICD-10-CM | POA: Diagnosis not present

## 2023-12-29 DIAGNOSIS — N3090 Cystitis, unspecified without hematuria: Secondary | ICD-10-CM | POA: Insufficient documentation

## 2023-12-29 DIAGNOSIS — W19XXXA Unspecified fall, initial encounter: Secondary | ICD-10-CM | POA: Insufficient documentation

## 2023-12-29 DIAGNOSIS — R296 Repeated falls: Secondary | ICD-10-CM | POA: Diagnosis not present

## 2023-12-29 LAB — URINALYSIS, W/ REFLEX TO CULTURE (INFECTION SUSPECTED)
Bilirubin Urine: NEGATIVE
Glucose, UA: NEGATIVE mg/dL
Ketones, ur: NEGATIVE mg/dL
Nitrite: NEGATIVE
Protein, ur: 100 mg/dL — AB
Specific Gravity, Urine: 1.018 (ref 1.005–1.030)
WBC, UA: >50 WBC/hpf (ref 0–5)
pH: 5 (ref 5.0–8.0)

## 2023-12-29 LAB — CBC WITH DIFFERENTIAL/PLATELET
Abs Immature Granulocytes: 0.01 10*3/uL (ref 0.00–0.07)
Basophils Absolute: 0 10*3/uL (ref 0.0–0.1)
Basophils Relative: 1 %
Eosinophils Absolute: 0.1 10*3/uL (ref 0.0–0.5)
Eosinophils Relative: 2 %
HCT: 34.1 % — ABNORMAL LOW (ref 39.0–52.0)
Hemoglobin: 11.7 g/dL — ABNORMAL LOW (ref 13.0–17.0)
Immature Granulocytes: 0 %
Lymphocytes Relative: 37 %
Lymphs Abs: 2.1 10*3/uL (ref 0.7–4.0)
MCH: 33.9 pg (ref 26.0–34.0)
MCHC: 34.3 g/dL (ref 30.0–36.0)
MCV: 98.8 fL (ref 80.0–100.0)
Monocytes Absolute: 0.5 10*3/uL (ref 0.1–1.0)
Monocytes Relative: 9 %
Neutro Abs: 2.9 10*3/uL (ref 1.7–7.7)
Neutrophils Relative %: 51 %
Platelets: 210 10*3/uL (ref 150–400)
RBC: 3.45 MIL/uL — ABNORMAL LOW (ref 4.22–5.81)
RDW: 12.3 % (ref 11.5–15.5)
WBC: 5.6 10*3/uL (ref 4.0–10.5)
nRBC: 0 % (ref 0.0–0.2)

## 2023-12-29 LAB — COMPREHENSIVE METABOLIC PANEL
ALT: 15 U/L (ref 0–44)
AST: 20 U/L (ref 15–41)
Albumin: 3.6 g/dL (ref 3.5–5.0)
Alkaline Phosphatase: 79 U/L (ref 38–126)
Anion gap: 5 (ref 5–15)
BUN: 19 mg/dL (ref 8–23)
CO2: 26 mmol/L (ref 22–32)
Calcium: 9.2 mg/dL (ref 8.9–10.3)
Chloride: 115 mmol/L — ABNORMAL HIGH (ref 98–111)
Creatinine, Ser: 0.89 mg/dL (ref 0.61–1.24)
GFR, Estimated: >60 mL/min (ref >60)
Glucose, Bld: 66 mg/dL — ABNORMAL LOW (ref 70–99)
Potassium: 3.8 mmol/L (ref 3.5–5.1)
Sodium: 146 mmol/L — ABNORMAL HIGH (ref 135–145)
Total Bilirubin: 0.5 mg/dL (ref 0.0–1.2)
Total Protein: 6.6 g/dL (ref 6.5–8.1)

## 2023-12-29 MED ORDER — CIPROFLOXACIN IN D5W 400 MG/200ML IV SOLN
400.0000 mg | Freq: Once | INTRAVENOUS | Status: AC
  Administered 2023-12-29: 400 mg via INTRAVENOUS
  Filled 2023-12-29: qty 200

## 2023-12-29 MED ORDER — LOSARTAN POTASSIUM 25 MG PO TABS
25.0000 mg | ORAL_TABLET | Freq: Every day | ORAL | Status: DC
  Administered 2023-12-29: 25 mg via ORAL
  Filled 2023-12-29: qty 1

## 2023-12-29 MED ORDER — AMLODIPINE BESYLATE 5 MG PO TABS
10.0000 mg | ORAL_TABLET | Freq: Every day | ORAL | Status: DC
  Filled 2023-12-29: qty 2

## 2023-12-29 MED ORDER — CIPROFLOXACIN HCL 500 MG PO TABS: 500.0000 mg | ORAL_TABLET | Freq: Two times a day (BID) | ORAL | 0 refills | Status: AC

## 2023-12-29 MED ORDER — LACTATED RINGERS IV BOLUS
1000.0000 mL | Freq: Once | INTRAVENOUS | Status: AC
  Administered 2023-12-29: 1000 mL via INTRAVENOUS

## 2023-12-29 NOTE — ED Triage Notes (Signed)
 Pt states has had foley in for 2 weeks, home health nurse told patient to come to the ER for possible UTI, pt complaining of right leg pain

## 2023-12-29 NOTE — ED Provider Notes (Signed)
 [place] EMERGENCY DEPARTMENT AT [facility] Provider Note   CSN: [id number] Arrival date & time: 12/29/23  1735     History  Chief Complaint  Patient presents with   foley- uti    Tanner Griffin is a 83 y.o. male.  HPI 83 year old male history of hypertension, hyperlipidemia and while chronic Foley catheter use presenting for concern for UTI.  Patient states for last few days about 5 days she has had generalized weakness, fatigue.  He also has noted some redness to his urinary catheter bag and some burning to his penis.  His catheter has been in place for about a year.  Is changed a few weeks ago last.  No fevers or chills.  No belly pain or flank pain or back pain.  He has had a couple falls over the last few days.  He does not think he is hit his head but is not sure.  He has no injury from the falls, he states he is just feels generalized weakness but does not have any dizziness or syncope with the falls.  No loss of consciousness.  He had subjective fevers at home with a temperature of 101, is afebrile here.     Home Medications Prior to Admission medications   Medication Sig Start Date End Date Taking? Authorizing Provider  ciprofloxacin  (CIPRO ) 500 MG tablet Take 1 tablet (500 mg total) by mouth every 12 (twelve) hours for 7 days. 12/29/23 01/05/24 Yes Ian Castagna H, MD  acetaminophen  (TYLENOL ) 500 MG tablet Take 1,000 mg by mouth every 6 (six) hours as needed for mild pain.    [provider]  alendronate  (FOSAMAX ) 70 MG tablet Take 1 tablet (70 mg total) by mouth every 7 (seven) days. Take with a full glass of water  on an empty stomach. 05/19/22   Berneta Elsie Sayre, MD  amLODipine  (NORVASC ) 10 MG tablet Take 1 tablet (10 mg total) by mouth daily. 05/19/22   Berneta Elsie Sayre, MD  aspirin  EC 81 MG tablet Take 81 mg by mouth daily. Swallow whole.    [provider]  atorvastatin  (LIPITOR) 40 MG tablet Take 1 tablet (40 mg total) by mouth  daily. 05/19/22   Berneta Elsie Sayre, MD  bismuth  subsalicylate (PEPTO-BISMOL) 262 MG chewable tablet Chew 2 tablets (524 mg total) by mouth as needed. 07/05/22   Berneta Elsie Sayre, MD  cephALEXin  (KEFLEX ) 500 MG capsule Take 1 capsule (500 mg total) by mouth 4 (four) times daily. 02/25/23   Emelia Sluder, PA-C  cholecalciferol (VITAMIN D3) 25 MCG (1000 UNIT) tablet Take 1,000 Units by mouth daily.    [provider]  diclofenac  Sodium (VOLTAREN ) 1 % GEL Apply a small grape sized dollop to painful joints as needed. Patient taking differently: Apply 2 g topically daily as needed (pain). Apply a small grape sized dollop to painful joints as needed. 07/05/22   Berneta Elsie Sayre, MD  loperamide  (IMODIUM ) 2 MG capsule Take 1 capsule (2 mg total) by mouth 4 (four) times daily as needed for diarrhea or loose stools. 05/30/23   Long, Fonda MATSU, MD  losartan  (COZAAR ) 25 MG tablet Take 1 tablet (25 mg total) by mouth daily. 08/14/22 11/12/22  Arlice Reichert, MD  ondansetron  (ZOFRAN -ODT) 4 MG disintegrating tablet Take 1 tablet (4 mg total) by mouth every 8 (eight) hours as needed for nausea or vomiting. 05/30/23   Long, Joshua G, MD  psyllium (METAMUCIL SMOOTH TEXTURE) 58.6 % powder Take 1 packet by mouth 3 (  three) times daily. 11/05/23   Smoot, Lauraine LABOR, PA-C  tamsulosin  (FLOMAX ) 0.4 MG CAPS capsule Take 0.4 mg by mouth daily. 09/24/21   [provider]  torsemide (DEMADEX) 5 MG tablet Take 5 mg by mouth daily. 11/04/23   [provider]      Allergies    Telmisartan    Review of Systems   Review of Systems Review of systems completed and notable as per HPI.  ROS otherwise negative.   Physical Exam Updated Vital Signs BP (!) 200/93   Pulse 81   Temp 98.3 F (36.8 C) (Oral)   Resp 16   SpO2 98%  Physical Exam Vitals and nursing note reviewed. Exam conducted with a chaperone present.  Constitutional:      General: He is not in acute distress.    Appearance: He is  well-developed.  HENT:     Head: Normocephalic and atraumatic.     Nose: Nose normal.     Mouth/Throat:     Mouth: Mucous membranes are moist.     Pharynx: Oropharynx is clear.  Eyes:     Extraocular Movements: Extraocular movements intact.     Conjunctiva/sclera: Conjunctivae normal.     Pupils: Pupils are equal, round, and reactive to light.  Cardiovascular:     Rate and Rhythm: Normal rate and regular rhythm.     Pulses: Normal pulses.     Heart sounds: Normal heart sounds. No murmur heard. Pulmonary:     Effort: Pulmonary effort is normal. No respiratory distress.     Breath sounds: Normal breath sounds.  Abdominal:     Palpations: Abdomen is soft.     Tenderness: There is no abdominal tenderness. There is no right CVA tenderness, left CVA tenderness, guarding or rebound.  Genitourinary:    Penis: Normal.   Musculoskeletal:        General: No swelling.     Cervical back: Normal range of motion and neck supple. No rigidity or tenderness.     Comments: No spinal tenderness  Skin:    General: Skin is warm and dry.     Capillary Refill: Capillary refill takes less than 2 seconds.  Neurological:     General: No focal deficit present.     Mental Status: He is alert and oriented to person, place, and time. Mental status is at baseline.     Cranial Nerves: No cranial nerve deficit.     Sensory: No sensory deficit.     Motor: No weakness.     Coordination: Coordination normal.  Psychiatric:        Mood and Affect: Mood normal.     ED Results / Procedures / Treatments   Labs (all labs ordered are listed, but only abnormal results are displayed) Labs Reviewed  COMPREHENSIVE METABOLIC PANEL - Abnormal; Notable for the following components:      Result Value   Sodium 146 (*)    Chloride 115 (*)    Glucose, Bld 66 (*)    All other components within normal limits  CBC WITH DIFFERENTIAL/PLATELET - Abnormal; Notable for the following components:   RBC 3.45 (*)    Hemoglobin  11.7 (*)    HCT 34.1 (*)    All other components within normal limits  URINALYSIS, W/ REFLEX TO CULTURE (INFECTION SUSPECTED) - Abnormal; Notable for the following components:   APPearance HAZY (*)    Hgb urine dipstick MODERATE (*)    Protein, ur 100 (*)    Leukocytes,Ua LARGE (*)  Bacteria, UA FEW (*)    All other components within normal limits  URINE CULTURE    EKG None  Radiology CT Head Wo Contrast Result Date: 12/29/2023 CLINICAL DATA:  Head trauma from fall EXAM: CT HEAD WITHOUT CONTRAST TECHNIQUE: Contiguous axial images were obtained from the base of the skull through the vertex without intravenous contrast. RADIATION DOSE REDUCTION: This exam was performed according to the departmental dose-optimization program which includes automated exposure control, adjustment of the mA and/or kV according to patient size and/or use of iterative reconstruction technique. COMPARISON:  08/10/2019 FINDINGS: Brain: No intracranial hemorrhage, mass effect, or evidence of acute infarct. No hydrocephalus. No extra-axial fluid collection. Age-commensurate cerebral atrophy and chronic small vessel ischemic disease. Vascular: No hyperdense vessel. Intracranial arterial calcification. Skull: No fracture or focal lesion. Sinuses/Orbits: No acute finding. Other: None. IMPRESSION: No acute intracranial abnormality. Electronically Signed   By: Norman Gatlin M.D.   On: 12/29/2023 19:19    Procedures Procedures    Medications Ordered in ED Medications  amLODipine  (NORVASC ) tablet 10 mg (has no administration in time range)  losartan  (COZAAR ) tablet 25 mg (25 mg Oral Given 12/29/23 2155)  ciprofloxacin  (CIPRO ) IVPB 400 mg (400 mg Intravenous New Bag/Given 12/29/23 2207)  lactated ringers  bolus 1,000 mL (0 mLs Intravenous Stopped 12/29/23 2233)    ED Course/ Medical Decision Making/ A&P Clinical Course as of 12/29/23 2239  Thu Dec 29, 2023  2157 Pharmacy: Cipro  500 PO BID [JD]    Clinical Course User  Index [JD] Nicholaus Cassondra DEL, MD                                 Medical Decision Making Amount and/or Complexity of Data Reviewed Labs: ordered. Radiology: ordered.  Risk Prescription drug management.   Medical Decision Making:   Tanner Griffin is a 83 y.o. male who presented to the ED today with generalized weakness, concern for UTI.  Vital signs reviewed.  Exam he is well-appearing.  He reports some pain around his catheter but external exam is normal.  He has had some mild hematuria to his urine over the last few days and generalized weakness concern for possible UTI.  Will exchange catheter and obtain lab workup.  He has had falls and generalized weakness with possible head injury, will obtain CT head.  He is no focality on exam or focal weakness I have low suspicion for stroke.  Does not appear to be septic.  He has benign abdominal exam without abdominal, flank or back pain I do not think he needs emergent imaging.   Patient placed on continuous vitals and telemetry monitoring while in ED which was reviewed periodically.  Reviewed and confirmed nursing documentation for past medical history, family history, social history.  Reassessment and Plan:   Urinalysis consistent with likely UTI after changing Foley.  Sodium slightly high, he is tolerating p.o. and got some IV fluids here.  Hemoglobin stable.  No fever or leukocytosis or signs of sepsis.  He is hypertensive because he did not take his medicine today, he is supposed to be taking losartan  which was given here with improvement in blood pressure.  Will give his home dose here as well as a dose of IV Cipro  after discussion with pharmacy and review of prior cultures.  Will send home on Cipro  500 twice daily.  Patient is comfortable this plan.  Recommend close PCP follow-up, strict return precautions given.  Patient's presentation is most consistent with acute complicated illness / injury requiring diagnostic workup.            Final Clinical Impression(s) / ED Diagnoses Final diagnoses:  Cystitis    Rx / DC Orders ED Discharge Orders          Ordered    ciprofloxacin  (CIPRO ) 500 MG tablet  Every 12 hours        12/29/23 2226              Nicholaus Cassondra DEL, MD 12/29/23 2239

## 2023-12-29 NOTE — Discharge Instructions (Signed)
 Your lab work was concerning for urinary tract infection.  Your sodium was slightly high as well, make sure you are drinking plenty of fluids.  Your blood pressure was high due to missing her medication.  Make sure you are taking her medication as prescribed.  I am starting you on an antibiotic to treat the infection.  I recommend you follow close with your doctor.  If you develop abdominal pain, back pain, vomiting or fever you should return to the ED.

## 2023-12-30 LAB — URINE CULTURE

## 2024-02-05 ENCOUNTER — Emergency Department (HOSPITAL_COMMUNITY): Payer: Medicare HMO

## 2024-02-05 ENCOUNTER — Emergency Department (HOSPITAL_COMMUNITY)
Admission: EM | Admit: 2024-02-05 | Discharge: 2024-02-05 | Disposition: A | Discharge status: Home / Self Care | Payer: Medicare HMO | Attending: Emergency Medicine | Admitting: Emergency Medicine

## 2024-02-05 ENCOUNTER — Other Ambulatory Visit: Payer: Self-pay

## 2024-02-05 DIAGNOSIS — R197 Diarrhea, unspecified: Secondary | ICD-10-CM | POA: Insufficient documentation

## 2024-02-05 DIAGNOSIS — Z79899 Other long term (current) drug therapy: Secondary | ICD-10-CM | POA: Insufficient documentation

## 2024-02-05 DIAGNOSIS — I700 Atherosclerosis of aorta: Secondary | ICD-10-CM | POA: Insufficient documentation

## 2024-02-05 DIAGNOSIS — E119 Type 2 diabetes mellitus without complications: Secondary | ICD-10-CM | POA: Diagnosis not present

## 2024-02-05 DIAGNOSIS — Z8546 Personal history of malignant neoplasm of prostate: Secondary | ICD-10-CM | POA: Insufficient documentation

## 2024-02-05 DIAGNOSIS — I10 Essential (primary) hypertension: Secondary | ICD-10-CM | POA: Insufficient documentation

## 2024-02-05 DIAGNOSIS — Z7982 Long term (current) use of aspirin: Secondary | ICD-10-CM | POA: Insufficient documentation

## 2024-02-05 LAB — COMPREHENSIVE METABOLIC PANEL WITH GFR
ALT: 10 U/L (ref 0–44)
AST: 30 U/L (ref 15–41)
Albumin: 3.7 g/dL (ref 3.5–5.0)
Alkaline Phosphatase: 67 U/L (ref 38–126)
Anion gap: 8 (ref 5–15)
BUN: 19 mg/dL (ref 8–23)
CO2: 25 mmol/L (ref 22–32)
Calcium: 9.5 mg/dL (ref 8.9–10.3)
Chloride: 107 mmol/L (ref 98–111)
Creatinine, Ser: 0.9 mg/dL (ref 0.61–1.24)
GFR, Estimated: >60 mL/min (ref >60)
Glucose, Bld: 82 mg/dL (ref 70–99)
Potassium: 4.5 mmol/L (ref 3.5–5.1)
Sodium: 140 mmol/L (ref 135–145)
Total Bilirubin: 1.4 mg/dL — ABNORMAL HIGH (ref 0.0–1.2)
Total Protein: 7 g/dL (ref 6.5–8.1)

## 2024-02-05 LAB — RESP PANEL BY RT-PCR (RSV, FLU A&B, COVID)  RVPGX2
Influenza A by PCR: NEGATIVE
Influenza B by PCR: NEGATIVE
Resp Syncytial Virus by PCR: NEGATIVE
SARS Coronavirus 2 by RT PCR: NEGATIVE

## 2024-02-05 LAB — MAGNESIUM: Magnesium: 2.2 mg/dL (ref 1.7–2.4)

## 2024-02-05 MED ORDER — IOHEXOL 300 MG/ML  SOLN
100.0000 mL | Freq: Once | INTRAMUSCULAR | Status: AC | PRN
  Administered 2024-02-05: 100 mL via INTRAVENOUS

## 2024-02-05 MED ORDER — SODIUM CHLORIDE 0.9 % IV BOLUS
1000.0000 mL | Freq: Once | INTRAVENOUS | Status: AC
  Administered 2024-02-05: 1000 mL via INTRAVENOUS

## 2024-02-05 MED ORDER — CHOLESTYRAMINE 4 G PO PACK: 4.0000 g | PACK | Freq: Three times a day (TID) | ORAL | 12 refills | Status: AC

## 2024-02-05 NOTE — ED Provider Notes (Signed)
[place] EMERGENCY DEPARTMENT AT [facility] Provider Note   CSN: [id number] Arrival date & time: 02/05/24  1247     History  Chief Complaint  Patient presents with   Diarrhea    Tanner Griffin is a 83 y.o. male history of diabetes, hypertension, type I Tanner Griffin, prostate neoplasm, BPH with Foley catheter presented for diarrhea for the past 2 days.  Patient was recently seen and diagnosed with cystitis and given ciprofloxacin at the end of last month but states 2 days ago he started having watery diarrhea.  Patient cannot say only times going to the bed and states is not able to hold it.  Patient denies fevers, hematochezia, melena, nauseous vomiting, sick contacts.  Patient able to tolerate food and fluids but states it runs through him.  Patient tried Imodium, Pepto-Bismol, Maalox to no relief.  Home Medications Prior to Admission medications   Medication Sig Start Date End Date Taking? Authorizing Provider  cholestyramine (QUESTRAN) 4 g packet Take 1 packet (4 g total) by mouth 3 (three) times daily with meals. 02/05/24  Yes Netta Corrigan, PA-C  acetaminophen (TYLENOL) 500 MG tablet Take 1,000 mg by mouth every 6 (six) hours as needed for mild pain.    [provider]  alendronate (FOSAMAX) 70 MG tablet Take 1 tablet (70 mg total) by mouth every 7 (seven) days. Take with a full glass of water on an empty stomach. 05/19/22   Mliss Sax, MD  amLODipine (NORVASC) 10 MG tablet Take 1 tablet (10 mg total) by mouth daily. 05/19/22   Mliss Sax, MD  aspirin EC 81 MG tablet Take 81 mg by mouth daily. Swallow whole.    [provider]  atorvastatin (LIPITOR) 40 MG tablet Take 1 tablet (40 mg total) by mouth daily. 05/19/22   Mliss Sax, MD  bismuth subsalicylate (PEPTO-BISMOL) 262 MG chewable tablet Chew 2 tablets (524 mg total) by mouth as needed. 07/05/22   Mliss Sax, MD  cephALEXin (KEFLEX) 500 MG capsule Take 1  capsule (500 mg total) by mouth 4 (four) times daily. 02/25/23   Theron Arista, PA-C  cholecalciferol (VITAMIN D3) 25 MCG (1000 UNIT) tablet Take 1,000 Units by mouth daily.    [provider]  diclofenac Sodium (VOLTAREN) 1 % GEL Apply a small grape sized dollop to painful joints as needed. Patient taking differently: Apply 2 g topically daily as needed (pain). Apply a small grape sized dollop to painful joints as needed. 07/05/22   Mliss Sax, MD  loperamide (IMODIUM) 2 MG capsule Take 1 capsule (2 mg total) by mouth 4 (four) times daily as needed for diarrhea or loose stools. 05/30/23   Long, Arlyss Repress, MD  losartan (COZAAR) 25 MG tablet Take 1 tablet (25 mg total) by mouth daily. 08/14/22 11/12/22  Lorin Glass, MD  ondansetron (ZOFRAN-ODT) 4 MG disintegrating tablet Take 1 tablet (4 mg total) by mouth every 8 (eight) hours as needed for nausea or vomiting. 05/30/23   Long, Arlyss Repress, MD  psyllium (METAMUCIL SMOOTH TEXTURE) 58.6 % powder Take 1 packet by mouth 3 (three) times daily. 11/05/23   Smoot, Shawn Route, PA-C  tamsulosin (FLOMAX) 0.4 MG CAPS capsule Take 0.4 mg by mouth daily. 09/24/21   [provider]  torsemide (DEMADEX) 5 MG tablet Take 5 mg by mouth daily. 11/04/23   [provider]      Allergies    Telmisartan    Review of Systems  Review of Systems  Gastrointestinal:  Positive for diarrhea.    Physical Exam Updated Vital Signs BP (!) 176/102   Pulse 77   Temp 98.6 F (37 C) (Oral)   Resp 16   SpO2 100%  Physical Exam Vitals reviewed.  Constitutional:      General: He is not in acute distress. HENT:     Head: Normocephalic and atraumatic.     Mouth/Throat:     Mouth: Mucous membranes are moist.  Eyes:     Extraocular Movements: Extraocular movements intact.     Conjunctiva/sclera: Conjunctivae normal.     Pupils: Pupils are equal, round, and reactive to light.  Cardiovascular:     Rate and Rhythm: Normal rate and regular rhythm.      Pulses: Normal pulses.     Heart sounds: Normal heart sounds.     Comments: 2+ bilateral radial/dorsalis pedis pulses with regular rate Pulmonary:     Effort: Pulmonary effort is normal. No respiratory distress.     Breath sounds: Normal breath sounds.  Abdominal:     Palpations: Abdomen is soft.     Tenderness: There is no abdominal tenderness. There is no guarding or rebound.  Musculoskeletal:        General: Normal range of motion.     Cervical back: Normal range of motion and neck supple.     Comments: 5 out of 5 bilateral grip/leg extension strength  Skin:    General: Skin is warm and dry.     Capillary Refill: Capillary refill takes less than 2 seconds.  Neurological:     General: No focal deficit present.     Mental Status: He is alert and oriented to person, place, and time.     Comments: Sensation intact in all 4 limbs  Psychiatric:        Mood and Affect: Mood normal.     ED Results / Procedures / Treatments   Labs (all labs ordered are listed, but only abnormal results are displayed) Labs Reviewed  COMPREHENSIVE METABOLIC PANEL - Abnormal; Notable for the following components:      Result Value   Total Bilirubin 1.4 (*)    All other components within normal limits  RESP PANEL BY RT-PCR (RSV, FLU A&B, COVID)  [id number]  GASTROINTESTINAL PANEL BY PCR, STOOL (REPLACES STOOL CULTURE)  C DIFFICILE QUICK SCREEN W PCR REFLEX    MAGNESIUM  CBC WITH DIFFERENTIAL/PLATELET  CBC WITH DIFFERENTIAL/PLATELET    EKG None  Radiology CT ABDOMEN PELVIS W CONTRAST Result Date: 02/05/2024 CLINICAL DATA:  Abdominal pain, acute, nonlocalized EXAM: CT ABDOMEN AND PELVIS WITH CONTRAST TECHNIQUE: Multidetector CT imaging of the abdomen and pelvis was performed using the standard protocol following bolus administration of intravenous contrast. RADIATION DOSE REDUCTION: This exam was performed according to the departmental dose-optimization program which includes automated exposure  control, adjustment of the mA and/or kV according to patient size and/or use of iterative reconstruction technique. CONTRAST:  OMNIPAQUE IOHEXOL 300 MG/ML  SOLN COMPARISON:  11/04/2023 FINDINGS: Lower chest: No acute abnormality. Hepatobiliary: Stable low-attenuation lesion at the posterior right hepatic dome, favored to represent a cyst or hemangioma. Otherwise unremarkable appearance of the liver. Unremarkable gallbladder. No hyperdense gallstone. No biliary dilatation. Pancreas: Unremarkable. No pancreatic ductal dilatation or surrounding inflammatory changes. Spleen: Normal in size without focal abnormality. Adrenals/Urinary Tract: Adrenal glands are unremarkable. Kidneys are normal, without renal calculi, focal lesion, or hydronephrosis. Urinary bladder decompressed by Foley catheter. Stomach/Bowel: Stomach within normal limits. No dilated  loops of bowel. Mild circumferential wall thickening of the rectosigmoid colon containing liquid stool. No additional sites of bowel wall thickening. Normal appendix (series 2, image 34). Vascular/Lymphatic: Aortic atherosclerosis. No enlarged abdominal or pelvic lymph nodes. Reproductive: Brachytherapy seeds at the prostate bed. Other: No free fluid. No abdominopelvic fluid collection. No pneumoperitoneum. No abdominal wall hernia. 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 No acute osseous abnormality. Lower lumbar spondylosis. IMPRESSION: 1. Mild circumferential wall thickening of the rectosigmoid colon containing liquid stool. Findings are suggestive of a nonspecific proctocolitis/diarrheal illness. 2. Otherwise, no acute abdominopelvic findings. 3. Aortic atherosclerosis (ICD10-I70.0). Electronically Signed   By: Duanne Guess D.O.   On: 02/05/2024 18:51    Procedures Procedures    Medications Ordered in ED Medications  sodium chloride 0.9 % bolus 1,000 mL (0 mLs Intravenous Stopped 02/05/24 1900)  iohexol  (OMNIPAQUE) 300 MG/ML solution 100 mL (100 mLs Intravenous Contrast Given 02/05/24 1758)    ED Course/ Medical Decision Making/ A&P                                 Medical Decision Making Amount and/or Complexity of Data Reviewed Labs: ordered. Radiology: ordered.  Risk Prescription drug management.   Donneta Romberg 83 y.o. presented today for diarrhea.  Working DDx that I considered at this time includes, but not limited to, electrolyte abnormalities, dehydration, Campylobacter, Salmonella, EHEC, Shigella, Vibrio, Yersinia, IBD, Ischemic Colitis, Viral AGE, E. Coli, C. Diff, Nora/Rotavirus, Giardia, Listeria, Clostridium, IBD, IBS.  R/o DDx: Pending  Review of prior external notes: 12/29/2023 ED  Unique Tests and My Independent Interpretation:  CBC: Unremarkable CMP: Unremarkable Magnesium: Unremarkable Respiratory panel: Negative C. difficile: Cannot provide sample GI panel: Pending  Social Determinants of Health: none  Discussion with Independent Historian:  Wife  Discussion of Management of Tests: None  Risk: Medium: prescription drug management  Risk Stratification Score: None  Staffed with Silverio Lay, MD  Plan: On exam patient was in no acute distress stable vitals.  Patient's exam was ultimately unremarkable and is he is not appear objectively dehydrated but will check labs.  Patient does have recent antibiotic use last month and so we will check C. difficile.  Labs ultimately reassuring.  Still waiting on GI panel.  CT scan shows nonspecific proctocolitis.  Patient's white count is normal and patient gave a firm stool sample and so the attending I have very low suspicion of C. difficile as his stool should have been watery if it was truly C. difficile.  I spoke to the patient and we will try Questran to help with his diarrhea and strongly encouraged him to push fluids and follow-up with his primary care provider to which patient is wife agreed.  Encourage patient to  follow-up on the MyChart app for the GI panel.  Patient was given return precautions. Patient stable for discharge at this time.  Patient verbalized understanding of plan.  This chart was dictated using voice recognition software.  Despite best efforts to proofread,  errors can occur which can change the documentation meaning.         Final Clinical Impression(s) / ED Diagnoses Final diagnoses:  Diarrhea, unspecified type    Rx / DC Orders ED Discharge Orders          Ordered    cholestyramine (QUESTRAN) 4 g packet  3 times daily with meals        02/05/24  [address]              Remi Deter 02/05/24 1940    Charlynne Pander, MD 02/06/24 [phone]

## 2024-02-05 NOTE — ED Triage Notes (Signed)
Pt arrives from home, c/o loose stools for 2 days. Pt states he has taken maalox but it didn't help.

## 2024-02-05 NOTE — Discharge Instructions (Addendum)
Please follow-up with your primary care provider in regards recent ER visit.  Today your labs and imaging are reassuring however we have a GI panel pending as so someone may call you about these results or any follow-up on the app.  Please remain hydrated and use the Questran that I prescribed for you.  If symptoms change or worsen please return to the ER.

## 2024-02-07 LAB — GASTROINTESTINAL PANEL BY PCR, STOOL (REPLACES STOOL CULTURE)

## 2024-02-18 NOTE — Progress Notes (Signed)
 GI pathogen ordered due to persistent diarrhea despite being on abx

## 2024-03-15 ENCOUNTER — Ambulatory Visit: Admitting: Podiatry

## 2024-03-29 ENCOUNTER — Encounter: Payer: Self-pay | Admitting: Podiatry

## 2024-03-29 ENCOUNTER — Ambulatory Visit: Admitting: Podiatry

## 2024-03-29 DIAGNOSIS — M79674 Pain in right toe(s): Secondary | ICD-10-CM | POA: Diagnosis not present

## 2024-03-29 DIAGNOSIS — M79675 Pain in left toe(s): Secondary | ICD-10-CM

## 2024-03-29 DIAGNOSIS — L84 Corns and callosities: Secondary | ICD-10-CM | POA: Diagnosis not present

## 2024-03-29 DIAGNOSIS — B351 Tinea unguium: Secondary | ICD-10-CM

## 2024-03-29 DIAGNOSIS — E1151 Type 2 diabetes mellitus with diabetic peripheral angiopathy without gangrene: Secondary | ICD-10-CM | POA: Diagnosis not present

## 2024-03-29 NOTE — Progress Notes (Unsigned)
 Subjective:   Patient ID: Tanner Griffin, male   DOB: 83 y.o.   MRN: [id number]   HPI Chief Complaint  Patient presents with   Nail Problem    RM#12 Patient states has a concern of nail fungus thickening of toe nail both feet. Patient is in need of routine nail care.    83 year old male presents the office today with concerns of his nails, thickened discolored is not able to trim them himself may cause discomfort.  No swelling or redness he reports send no open lesions however he does get calluses.  Review of Systems  All other systems reviewed and are negative.   Past Medical History:  Diagnosis Date   Arthritis    Arthrofibrosis of total knee arthroplasty, right 03/26/2014   Constipation    takes Miralax daily as needed   Headache    occasional   HYPERLIPIDEMIA    takes Lipitor daily   HYPERTENSION    takes Amlodipine and Labetalol daily   Joint pain    Joint swelling    Nocturia    Osteoarthritis of left knee 03/26/2014   Pneumonia    hx of-as a child   Prostate cancer (HCC)    Urinary frequency    takes Flomax daily   Wears eyeglasses    Wears partial dentures    top     Past Surgical History:  Procedure Laterality Date   COLONOSCOPY     HERNIA REPAIR     inguinal 04/14/17   INGUINAL HERNIA REPAIR Right 04/14/2017   Procedure: RIGHT INGUINAL HERNIA REPAIR WITH MESH;  Surgeon: Darnell Level, MD;  Location: [facility];  Service: General;  Laterality: Right;   INSERTION OF MESH Right 04/14/2017   Procedure: INSERTION OF MESH;  Surgeon: Darnell Level, MD;  Location: [facility];  Service: General;  Laterality: Right;   KNEE CLOSED REDUCTION Right 03/26/2014   Procedure: CLOSED MANIPULATION KNEE;  Surgeon: Eulas Post, MD;  Location: [facility];  Service: Orthopedics;  Laterality: Right;   RADIOACTIVE SEED IMPLANT N/A 06/15/2019   Procedure: RADIOACTIVE SEED IMPLANT/BRACHYTHERAPY IMPLANT;  Surgeon: Ihor Gully, MD;  Location: [facility];  Service: Urology;  Laterality:  N/A;   SPACE OAR INSTILLATION N/A 06/15/2019   Procedure: SPACE OAR INSTILLATION;  Surgeon: Ihor Gully, MD;  Location: [facility];  Service: Urology;  Laterality: N/A;   TOTAL KNEE ARTHROPLASTY  2011   right, Dr. Juliene Pina per pt   TOTAL KNEE ARTHROPLASTY Left 03/26/2014   Procedure: TOTAL KNEE ARTHROPLASTY;  Surgeon: Eulas Post, MD;  Location: [facility];  Service: Orthopedics;  Laterality: Left;     Current Outpatient Medications:    acetaminophen (TYLENOL) 500 MG tablet, Take 1,000 mg by mouth every 6 (six) hours as needed for mild pain., Disp: , Rfl:    alendronate (FOSAMAX) 70 MG tablet, Take 1 tablet (70 mg total) by mouth every 7 (seven) days. Take with a full glass of water on an empty stomach., Disp: 12 tablet, Rfl: 4   amLODipine (NORVASC) 10 MG tablet, Take 1 tablet (10 mg total) by mouth daily., Disp: 90 tablet, Rfl: 3   aspirin EC 81 MG tablet, Take 81 mg by mouth daily. Swallow whole., Disp: , Rfl:    atorvastatin (LIPITOR) 40 MG tablet, Take 1 tablet (40 mg total) by mouth daily., Disp: 90 tablet, Rfl: 3   bismuth subsalicylate (PEPTO-BISMOL) 262 MG chewable tablet, Chew 2 tablets (524 mg total) by mouth as needed., Disp: 30  tablet, Rfl: 0   cholecalciferol (VITAMIN D3) 25 MCG (1000 UNIT) tablet, Take 1,000 Units by mouth daily., Disp: , Rfl:    cholestyramine (QUESTRAN) 4 g packet, Take 1 packet (4 g total) by mouth 3 (three) times daily with meals., Disp: 60 each, Rfl: 12   diclofenac Sodium (VOLTAREN) 1 % GEL, Apply a small grape sized dollop to painful joints as needed. (Patient taking differently: Apply 2 g topically daily as needed (pain). Apply a small grape sized dollop to painful joints as needed.), Disp: 150 g, Rfl: 2   loperamide (IMODIUM) 2 MG capsule, Take 1 capsule (2 mg total) by mouth 4 (four) times daily as needed for diarrhea or loose stools., Disp: 12 capsule, Rfl: 0   ondansetron (ZOFRAN-ODT) 4 MG disintegrating tablet, Take 1 tablet (4 mg total)  by mouth every 8 (eight) hours as needed for nausea or vomiting., Disp: 20 tablet, Rfl: 0   psyllium (METAMUCIL SMOOTH TEXTURE) 58.6 % powder, Take 1 packet by mouth 3 (three) times daily., Disp: 283 g, Rfl: 12   tamsulosin (FLOMAX) 0.4 MG CAPS capsule, Take 0.4 mg by mouth daily., Disp: , Rfl:    torsemide (DEMADEX) 5 MG tablet, Take 5 mg by mouth daily., Disp: , Rfl:    cephALEXin (KEFLEX) 500 MG capsule, Take 1 capsule (500 mg total) by mouth 4 (four) times daily. (Patient not taking: Reported on 03/29/2024), Disp: 20 capsule, Rfl: 0   losartan (COZAAR) 25 MG tablet, Take 1 tablet (25 mg total) by mouth daily., Disp: 30 tablet, Rfl: 2  Allergies  Allergen Reactions   Telmisartan Other (See Comments)    Reaction:  Headache          Objective:  Physical Exam  General: AAO x3, NAD  Dermatological: Nails are hypertrophic, dystrophic, brittle, discolored, elongated 10. No surrounding redness or drainage. Tenderness nails 1-5 bilaterally.  Hyperkeratotic lesions noted plantar aspect right heel, medial hallux bilaterally as well as the distal aspect of the right third toe without any underlying ulceration, drainage or signs of infection.  Vascular: DP pulse 1/4, PT pulse 2/4.  CRT less than 3 seconds.  Neruologic: Grossly intact via light touch bilateral.   Musculoskeletal: No other areas of discomfort.  Digital contracture present.      Assessment:   Symptomatic onychomycosis, hyperkeratotic lesions     Plan:  -Treatment options discussed including all alternatives, risks, and complications -Etiology of symptoms were discussed -Sharply debrided nails x 10 without any complications or bleeding. -Sharp debrided hyperkeratotic lesions x 4 without any complications or bleeding.  Discussed moisturizer, offloading daily. -Currently no ulcers or signs of claudication.  Continue to monitor circulation. -Daily foot inspection.    Vivi Barrack DPM

## 2024-05-27 ENCOUNTER — Other Ambulatory Visit: Payer: Self-pay

## 2024-05-27 ENCOUNTER — Encounter (HOSPITAL_COMMUNITY): Payer: Self-pay | Admitting: Emergency Medicine

## 2024-05-27 ENCOUNTER — Emergency Department (HOSPITAL_COMMUNITY)
Admission: EM | Admit: 2024-05-27 | Discharge: 2024-05-27 | Disposition: A | Discharge status: Home / Self Care | Attending: Emergency Medicine | Admitting: Emergency Medicine

## 2024-05-27 DIAGNOSIS — Z7982 Long term (current) use of aspirin: Secondary | ICD-10-CM | POA: Diagnosis not present

## 2024-05-27 DIAGNOSIS — T839XXA Unspecified complication of genitourinary prosthetic device, implant and graft, initial encounter: Secondary | ICD-10-CM

## 2024-05-27 DIAGNOSIS — Y732 Prosthetic and other implants, materials and accessory gastroenterology and urology devices associated with adverse incidents: Secondary | ICD-10-CM | POA: Insufficient documentation

## 2024-05-27 DIAGNOSIS — T83098A Other mechanical complication of other indwelling urethral catheter, initial encounter: Secondary | ICD-10-CM | POA: Insufficient documentation

## 2024-05-27 NOTE — ED Provider Notes (Signed)
 [place] EMERGENCY DEPARTMENT AT [facility] Provider Note   CSN: [id number] Arrival date & time: 05/27/24  4098     History  No chief complaint on file.   Tanner Griffin is a 83 y.o. male.  This is a 83 year old male who presents after an overtly removing his Foley catheter.  States he is has catheter for over 2 years.  States it is related to prostate issues.  Today at home he turned a certain way and it pulled the catheter out with the balloon fully inflated.  States he had some liquid come out.  Denies any suprapubic pressure.       Home Medications Prior to Admission medications   Medication Sig Start Date End Date Taking? Authorizing Provider  acetaminophen  (TYLENOL ) 500 MG tablet Take 1,000 mg by mouth every 6 (six) hours as needed for mild pain.    [provider]  alendronate  (FOSAMAX ) 70 MG tablet Take 1 tablet (70 mg total) by mouth every 7 (seven) days. Take with a full glass of water  on an empty stomach. 05/19/22   Tonna Frederic, MD  amLODipine  (NORVASC ) 10 MG tablet Take 1 tablet (10 mg total) by mouth daily. 05/19/22   Tonna Frederic, MD  aspirin  EC 81 MG tablet Take 81 mg by mouth daily. Swallow whole.    [provider]  atorvastatin  (LIPITOR) 40 MG tablet Take 1 tablet (40 mg total) by mouth daily. 05/19/22   Tonna Frederic, MD  bismuth  subsalicylate (PEPTO-BISMOL) 262 MG chewable tablet Chew 2 tablets (524 mg total) by mouth as needed. 07/05/22   Tonna Frederic, MD  cephALEXin  (KEFLEX ) 500 MG capsule Take 1 capsule (500 mg total) by mouth 4 (four) times daily. Patient not taking: Reported on 03/29/2024 02/25/23   Delray Fielding, PA-C  cholecalciferol (VITAMIN D3) 25 MCG (1000 UNIT) tablet Take 1,000 Units by mouth daily.    [provider]  cholestyramine  (QUESTRAN ) 4 g packet Take 1 packet (4 g total) by mouth 3 (three) times daily with meals. 02/05/24   Denese Finn, PA-C  diclofenac  Sodium  (VOLTAREN ) 1 % GEL Apply a small grape sized dollop to painful joints as needed. Patient taking differently: Apply 2 g topically daily as needed (pain). Apply a small grape sized dollop to painful joints as needed. 07/05/22   Tonna Frederic, MD  loperamide  (IMODIUM ) 2 MG capsule Take 1 capsule (2 mg total) by mouth 4 (four) times daily as needed for diarrhea or loose stools. 05/30/23   Long, Shereen Dike, MD  losartan  (COZAAR ) 25 MG tablet Take 1 tablet (25 mg total) by mouth daily. 08/14/22 11/12/22  Hoyt Macleod, MD  ondansetron  (ZOFRAN -ODT) 4 MG disintegrating tablet Take 1 tablet (4 mg total) by mouth every 8 (eight) hours as needed for nausea or vomiting. 05/30/23   Long, Joshua G, MD  psyllium (METAMUCIL SMOOTH TEXTURE) 58.6 % powder Take 1 packet by mouth 3 (three) times daily. 11/05/23   Smoot, Genevive Ket, PA-C  tamsulosin  (FLOMAX ) 0.4 MG CAPS capsule Take 0.4 mg by mouth daily. 09/24/21   [provider]  torsemide (DEMADEX) 5 MG tablet Take 5 mg by mouth daily. 11/04/23   [provider]      Allergies    Telmisartan    Review of Systems   Review of Systems  All other systems reviewed and are negative.   Physical Exam Updated Vital Signs There were no vitals taken for this visit. Physical Exam  Vitals and nursing note reviewed. Exam conducted with a chaperone present.  Constitutional:      General: He is not in acute distress.    Appearance: Normal appearance. He is well-developed. He is not toxic-appearing.  HENT:     Head: Normocephalic and atraumatic.  Eyes:     General: Lids are normal.     Conjunctiva/sclera: Conjunctivae normal.     Pupils: Pupils are equal, round, and reactive to light.  Neck:     Thyroid : No thyroid  mass.     Trachea: No tracheal deviation.  Cardiovascular:     Rate and Rhythm: Normal rate and regular rhythm.     Heart sounds: Normal heart sounds. No murmur heard.    No gallop.  Pulmonary:     Effort: Pulmonary effort is normal.  No respiratory distress.     Breath sounds: Normal breath sounds. No stridor. No decreased breath sounds, wheezing, rhonchi or rales.  Abdominal:     General: There is no distension.     Palpations: Abdomen is soft.     Tenderness: There is no abdominal tenderness. There is no rebound.  Genitourinary:    Penis: Uncircumcised.      Comments: Blood at the meatus Musculoskeletal:        General: No tenderness. Normal range of motion.     Cervical back: Normal range of motion and neck supple.  Skin:    General: Skin is warm and dry.     Findings: No abrasion or rash.  Neurological:     Mental Status: He is alert and oriented to person, place, and time. Mental status is at baseline.     GCS: GCS eye subscore is 4. GCS verbal subscore is 5. GCS motor subscore is 6.     Cranial Nerves: No cranial nerve deficit.     Sensory: No sensory deficit.     Motor: Motor function is intact.  Psychiatric:        Attention and Perception: Attention normal.        Speech: Speech normal.        Behavior: Behavior normal.     ED Results / Procedures / Treatments   Labs (all labs ordered are listed, but only abnormal results are displayed) Labs Reviewed - No data to display  EKG None  Radiology No results found.  Procedures Procedures    Medications Ordered in ED Medications - No data to display  ED Course/ Medical Decision Making/ A&P                                 Medical Decision Making  Patient had Foley catheter placed by nursing staff.  Was monitored and he has good urine output.  Urine is pink-tinged.  Patient has no complaints of suprapubic pressure.  Will discharge back home        Final Clinical Impression(s) / ED Diagnoses Final diagnoses:  None    Rx / DC Orders ED Discharge Orders     None         Lind Repine, MD 05/27/24 [phone]

## 2024-05-27 NOTE — ED Triage Notes (Signed)
 Pt. BIBEMS w/ c/o foley catheter displacement secondary to a fall. Per EMS pt. Slid off the toilet.  Hx prostate cancer and currently receiving txt. Pt. Is under hospice care. No c/o pain. No LOC.  Per EMS: BP: 162/90 HR: 71 SPO2: 99% Ra CBG: 115

## 2024-05-27 NOTE — Discharge Instructions (Signed)
 Return here for any trouble with urine output through the catheter tubing

## 2024-06-28 ENCOUNTER — Ambulatory Visit: Admitting: Podiatry

## 2024-11-16 ENCOUNTER — Inpatient Hospital Stay (HOSPITAL_COMMUNITY)
Admission: EM | Admit: 2024-11-16 | Discharge: 2024-11-24 | DRG: 698 | Disposition: A | Attending: Internal Medicine | Admitting: Internal Medicine

## 2024-11-16 ENCOUNTER — Other Ambulatory Visit: Payer: Self-pay

## 2024-11-16 ENCOUNTER — Emergency Department (HOSPITAL_COMMUNITY)

## 2024-11-16 ENCOUNTER — Encounter (HOSPITAL_COMMUNITY): Payer: Self-pay

## 2024-11-16 DIAGNOSIS — N179 Acute kidney failure, unspecified: Secondary | ICD-10-CM | POA: Diagnosis not present

## 2024-11-16 DIAGNOSIS — N401 Enlarged prostate with lower urinary tract symptoms: Secondary | ICD-10-CM | POA: Diagnosis present

## 2024-11-16 DIAGNOSIS — C61 Malignant neoplasm of prostate: Secondary | ICD-10-CM | POA: Diagnosis present

## 2024-11-16 DIAGNOSIS — E860 Dehydration: Secondary | ICD-10-CM

## 2024-11-16 DIAGNOSIS — R7881 Bacteremia: Secondary | ICD-10-CM

## 2024-11-16 DIAGNOSIS — L89154 Pressure ulcer of sacral region, stage 4: Secondary | ICD-10-CM | POA: Diagnosis present

## 2024-11-16 DIAGNOSIS — N138 Other obstructive and reflux uropathy: Secondary | ICD-10-CM | POA: Diagnosis present

## 2024-11-16 DIAGNOSIS — A419 Sepsis, unspecified organism: Principal | ICD-10-CM | POA: Diagnosis present

## 2024-11-16 DIAGNOSIS — G9341 Metabolic encephalopathy: Principal | ICD-10-CM

## 2024-11-16 DIAGNOSIS — E785 Hyperlipidemia, unspecified: Secondary | ICD-10-CM | POA: Diagnosis present

## 2024-11-16 DIAGNOSIS — A499 Bacterial infection, unspecified: Secondary | ICD-10-CM | POA: Diagnosis present

## 2024-11-16 DIAGNOSIS — I10 Essential (primary) hypertension: Secondary | ICD-10-CM | POA: Diagnosis present

## 2024-11-16 DIAGNOSIS — I152 Hypertension secondary to endocrine disorders: Secondary | ICD-10-CM

## 2024-11-16 DIAGNOSIS — N390 Urinary tract infection, site not specified: Secondary | ICD-10-CM

## 2024-11-16 DIAGNOSIS — E119 Type 2 diabetes mellitus without complications: Secondary | ICD-10-CM

## 2024-11-16 LAB — CBC WITH DIFFERENTIAL/PLATELET
Abs Immature Granulocytes: 0.38 K/uL — ABNORMAL HIGH (ref 0.00–0.07)
Basophils Absolute: 0 K/uL (ref 0.0–0.1)
Basophils Relative: 0 %
Eosinophils Absolute: 0.1 K/uL (ref 0.0–0.5)
Eosinophils Relative: 0 %
HCT: 30.9 % — ABNORMAL LOW (ref 39.0–52.0)
Hemoglobin: 9.7 g/dL — ABNORMAL LOW (ref 13.0–17.0)
Immature Granulocytes: 2 %
Lymphocytes Relative: 3 %
Lymphs Abs: 0.8 K/uL (ref 0.7–4.0)
MCH: 30.3 pg (ref 26.0–34.0)
MCHC: 31.4 g/dL (ref 30.0–36.0)
MCV: 96.6 fL (ref 80.0–100.0)
Monocytes Absolute: 0.7 K/uL (ref 0.1–1.0)
Monocytes Relative: 3 %
Neutro Abs: 20.9 K/uL — ABNORMAL HIGH (ref 1.7–7.7)
Neutrophils Relative %: 92 %
Platelets: 273 K/uL (ref 150–400)
RBC: 3.2 MIL/uL — ABNORMAL LOW (ref 4.22–5.81)
RDW: 16.4 % — ABNORMAL HIGH (ref 11.5–15.5)
WBC: 22.9 K/uL — ABNORMAL HIGH (ref 4.0–10.5)
nRBC: 0 % (ref 0.0–0.2)

## 2024-11-16 LAB — URINALYSIS, W/ REFLEX TO CULTURE (INFECTION SUSPECTED)
Bilirubin Urine: NEGATIVE
Glucose, UA: NEGATIVE mg/dL
Ketones, ur: NEGATIVE mg/dL
Nitrite: NEGATIVE
Protein, ur: >=300 mg/dL — AB
RBC / HPF: >50 RBC/hpf (ref 0–5)
Specific Gravity, Urine: 1.01 (ref 1.005–1.030)
WBC, UA: >50 WBC/hpf (ref 0–5)
pH: 7 (ref 5.0–8.0)

## 2024-11-16 LAB — I-STAT CHEM 8, ED
BUN: 33 mg/dL — ABNORMAL HIGH (ref 8–23)
Calcium, Ion: 1.02 mmol/L — ABNORMAL LOW (ref 1.15–1.40)
Chloride: 109 mmol/L (ref 98–111)
Creatinine, Ser: 2.2 mg/dL — ABNORMAL HIGH (ref 0.61–1.24)
Glucose, Bld: 103 mg/dL — ABNORMAL HIGH (ref 70–99)
HCT: 33 % — ABNORMAL LOW (ref 39.0–52.0)
Hemoglobin: 11.2 g/dL — ABNORMAL LOW (ref 13.0–17.0)
Potassium: 5.3 mmol/L — ABNORMAL HIGH (ref 3.5–5.1)
Sodium: 139 mmol/L (ref 135–145)
TCO2: 22 mmol/L (ref 22–32)

## 2024-11-16 LAB — I-STAT CG4 LACTIC ACID, ED
Lactic Acid, Venous: 2.3 mmol/L (ref 0.5–1.9)
Lactic Acid, Venous: 2.9 mmol/L (ref 0.5–1.9)

## 2024-11-16 LAB — PROTIME-INR
INR: 1.5 — ABNORMAL HIGH (ref 0.8–1.2)
Prothrombin Time: 18.8 s — ABNORMAL HIGH (ref 11.4–15.2)

## 2024-11-16 LAB — COMPREHENSIVE METABOLIC PANEL WITH GFR
ALT: 9 U/L (ref 0–44)
AST: 41 U/L (ref 15–41)
Albumin: 2.5 g/dL — ABNORMAL LOW (ref 3.5–5.0)
Alkaline Phosphatase: 88 U/L (ref 38–126)
Anion gap: 11 (ref 5–15)
BUN: 24 mg/dL — ABNORMAL HIGH (ref 8–23)
CO2: 19 mmol/L — ABNORMAL LOW (ref 22–32)
Calcium: 8.5 mg/dL — ABNORMAL LOW (ref 8.9–10.3)
Chloride: 108 mmol/L (ref 98–111)
Creatinine, Ser: 2.06 mg/dL — ABNORMAL HIGH (ref 0.61–1.24)
GFR, Estimated: 32 mL/min — ABNORMAL LOW (ref >60)
Glucose, Bld: 99 mg/dL (ref 70–99)
Potassium: 4.7 mmol/L (ref 3.5–5.1)
Sodium: 138 mmol/L (ref 135–145)
Total Bilirubin: 1.3 mg/dL — ABNORMAL HIGH (ref 0.0–1.2)
Total Protein: 6.4 g/dL — ABNORMAL LOW (ref 6.5–8.1)

## 2024-11-16 LAB — RESP PANEL BY RT-PCR (RSV, FLU A&B, COVID)  RVPGX2
Influenza A by PCR: NEGATIVE
Influenza B by PCR: NEGATIVE
Resp Syncytial Virus by PCR: NEGATIVE
SARS Coronavirus 2 by RT PCR: NEGATIVE

## 2024-11-16 LAB — CREATININE, SERUM
Creatinine, Ser: 1.74 mg/dL — ABNORMAL HIGH (ref 0.61–1.24)
GFR, Estimated: 39 mL/min — ABNORMAL LOW (ref >60)

## 2024-11-16 MED ORDER — HEPARIN SODIUM (PORCINE) 5000 UNIT/ML IJ SOLN
5000.0000 [IU] | Freq: Three times a day (TID) | INTRAMUSCULAR | Status: DC
  Administered 2024-11-16 – 2024-11-23 (×19): 5000 [IU] via SUBCUTANEOUS
  Filled 2024-11-16 (×21): qty 1

## 2024-11-16 MED ORDER — LACTATED RINGERS IV BOLUS (SEPSIS)
1000.0000 mL | Freq: Once | INTRAVENOUS | Status: AC
  Administered 2024-11-16: 1000 mL via INTRAVENOUS

## 2024-11-16 MED ORDER — POLYETHYLENE GLYCOL 3350 17 G PO PACK: 17.0000 g | PACK | Freq: Every day | ORAL | Status: DC | PRN

## 2024-11-16 MED ORDER — SODIUM CHLORIDE 0.9 % IV SOLN: 2.0000 g | Freq: Every day | INTRAVENOUS | Status: DC

## 2024-11-16 MED ORDER — ASPIRIN 81 MG PO TBEC
81.0000 mg | DELAYED_RELEASE_TABLET | Freq: Every day | ORAL | Status: DC
  Administered 2024-11-18 – 2024-11-24 (×7): 81 mg via ORAL
  Filled 2024-11-16 (×7): qty 1

## 2024-11-16 MED ORDER — METRONIDAZOLE 500 MG/100ML IV SOLN
500.0000 mg | Freq: Once | INTRAVENOUS | Status: AC
  Administered 2024-11-16: 500 mg via INTRAVENOUS
  Filled 2024-11-16: qty 100

## 2024-11-16 MED ORDER — METOPROLOL TARTRATE 5 MG/5ML IV SOLN: 5.0000 mg | INTRAVENOUS | Status: DC | PRN

## 2024-11-16 MED ORDER — TAMSULOSIN HCL 0.4 MG PO CAPS
0.4000 mg | ORAL_CAPSULE | Freq: Every day | ORAL | Status: DC
  Administered 2024-11-16 – 2024-11-24 (×8): 0.4 mg via ORAL
  Filled 2024-11-16 (×8): qty 1

## 2024-11-16 MED ORDER — SODIUM CHLORIDE 0.9 % IV SOLN
2.0000 g | Freq: Once | INTRAVENOUS | Status: AC
  Administered 2024-11-16: 2 g via INTRAVENOUS
  Filled 2024-11-16: qty 12.5

## 2024-11-16 MED ORDER — SODIUM CHLORIDE 0.9 % IV BOLUS
1000.0000 mL | Freq: Once | INTRAVENOUS | Status: AC
  Administered 2024-11-16: 1000 mL via INTRAVENOUS

## 2024-11-16 MED ORDER — ACETAMINOPHEN 500 MG PO TABS
1000.0000 mg | ORAL_TABLET | Freq: Four times a day (QID) | ORAL | Status: DC | PRN
  Administered 2024-11-19: 1000 mg via ORAL
  Filled 2024-11-16: qty 2

## 2024-11-16 MED ORDER — CHLORHEXIDINE GLUCONATE CLOTH 2 % EX PADS
6.0000 | MEDICATED_PAD | Freq: Every day | CUTANEOUS | Status: DC
  Administered 2024-11-16 – 2024-11-23 (×7): 6 via TOPICAL

## 2024-11-16 MED ORDER — ACETAMINOPHEN 650 MG RE SUPP
650.0000 mg | Freq: Once | RECTAL | Status: AC
  Administered 2024-11-16: 650 mg via RECTAL
  Filled 2024-11-16: qty 1

## 2024-11-16 MED ORDER — VANCOMYCIN HCL IN DEXTROSE 1-5 GM/200ML-% IV SOLN: 1000.0000 mg | INTRAVENOUS | Status: DC

## 2024-11-16 MED ORDER — ACETAMINOPHEN 650 MG RE SUPP: 650.0000 mg | Freq: Four times a day (QID) | RECTAL | Status: DC | PRN

## 2024-11-16 MED ORDER — ACETAMINOPHEN 325 MG PO TABS
650.0000 mg | ORAL_TABLET | Freq: Four times a day (QID) | ORAL | Status: DC | PRN
  Filled 2024-11-16: qty 2

## 2024-11-16 MED ORDER — VANCOMYCIN HCL IN DEXTROSE 1-5 GM/200ML-% IV SOLN
1000.0000 mg | Freq: Once | INTRAVENOUS | Status: AC
  Administered 2024-11-16: 1000 mg via INTRAVENOUS
  Filled 2024-11-16: qty 200

## 2024-11-16 MED ORDER — LACTATED RINGERS IV SOLN: INTRAVENOUS | Status: AC

## 2024-11-16 MED ORDER — METRONIDAZOLE 500 MG/100ML IV SOLN
500.0000 mg | Freq: Two times a day (BID) | INTRAVENOUS | Status: DC
  Administered 2024-11-17: 500 mg via INTRAVENOUS
  Filled 2024-11-16: qty 100

## 2024-11-16 NOTE — Assessment & Plan Note (Signed)
 On phone amlodipine  and losartan  These are currently being held due to sepsis and low blood pressure on admission.

## 2024-11-16 NOTE — Assessment & Plan Note (Signed)
 Continue Flomax  This catheter was changed in the ED

## 2024-11-16 NOTE — Consult Note (Signed)
 WOC Nurse Consult Note: Reason for Consult: sacral wound/L elbow and meatus  Wound type: Stage 3 Pressure injury sacrum red moist  Pressure Injury POA: Yes Measurement: see nursing flowsheet  Wound bed: red moist  Drainage (amount, consistency, odor) tan exudate on old dressing  Periwound: pink denuded skin  Dressing procedure/placement/frequency: Cleanse sacral wound with Vashe, do not rinse. Using a Q tip applicator insert Vashe moistened kerlix roll gauze into wound bed making sure to cover area of depth, cover with dry gauze and ABD pad and tape or silicone foam whichever is preferred.   Did request photo documentation of L hip and meatus wounds.    Thank you,    Powell Bar MSN, RN-BC, [employer]

## 2024-11-16 NOTE — Assessment & Plan Note (Signed)
 Currently on no meds with normal Monitor

## 2024-11-16 NOTE — Progress Notes (Signed)
 [facility] liaison note     This patient is a current hospice patient with [facility]. Please see media tab for detailed hospice report.    Liaison will continue to follow for any discharge planning needs and to coordinate continuation of hospice care.    Please don't hesitate to call with any Hospice related questions or concerns.    Thank you for the opportunity to participate in this patient's care.  Daphne Shed, LPN [facility] Liaison [phone]

## 2024-11-16 NOTE — ED Triage Notes (Addendum)
 Patient BIB [facility] from home. Patient is on hospice and is a full code. Was normal self yesterday, today not speaking. Groaning to pain. EMS BP 78/34. 91% room air.   EMS 20G left hand fluid

## 2024-11-16 NOTE — Hospital Course (Signed)
 Patient is a 83 year old with history of prostate cancer, HTN who has been previously on hospice care and has an indwelling catheter due to prostate issues who is brought in today by his wife because she does not think he looks well.  She would also like him to be a full code.  In the ED he was noted to be febrile 100.8 with a very dirty urine and a catheter look like it had not been changed for months.  He had an elevated lactic acid from 2.3 up to 2.9 he was fluid resuscitated and started on Vanco and cefepime  and Flagyl.  He was also noted to have a stage IV decubitus ulcer.  Patient is he is unable to give much history and history was obtained by talking to his wife who also suffers from dementia as well as the wife's sister.

## 2024-11-16 NOTE — Progress Notes (Signed)
 Pharmacy Antibiotic Note  Tanner Griffin is a 83 y.o. male admitted on 11/16/2024 with hypotension and concern for sepsis with multiple possible sources (chronic foley, decub ulcer.  Pharmacy has been consulted for vancomycin  dosing. Already on cefepime  and Flagyl  per MD  Plan: Vancomycin  1000 mg IV q48 hr (est AUC 475 based on SCr 2.2; Vd 0.72) Measure vancomycin  AUC at steady state as indicated SCr daily while on vanc with new AKI - if SCr worsens, would consider alternative therapy (linezolid , daptomycin) Continue cefepime  and Flagyl  as ordered  Height: 5' 8 (172.7 cm) Weight: 62 kg (136 lb 11 oz) IBW/kg (Calculated) : 68.4  Temp (24hrs), Avg:99.3 F (37.4 C), Min:97.8 F (36.6 C), Max:100.8 F (38.2 C)  Recent Labs  Lab 11/16/24 1620 11/16/24 1629 11/16/24 1820 11/16/24 1826 11/16/24 1827  WBC  --  22.9*  --   --   --   CREATININE  --   --  2.06*  --  2.20*  LATICACIDVEN 2.3*  --   --  2.9*  --     Estimated Creatinine Clearance: 23.1 mL/min (A) (by C-G formula based on SCr of 2.2 mg/dL (H)).    Allergies  Allergen Reactions   Telmisartan Other (See Comments)    Reaction:  Headache      Thank you for allowing pharmacy to be a part of this patient's care.  Copper Kirtley A 11/16/2024 9:31 PM

## 2024-11-16 NOTE — Assessment & Plan Note (Addendum)
 Likely secondary to urinary tract versus sacral decubiti Noted with lactic acid greater than 2.9 Elevated white count at 22.9 Hypotension on admission Continue antibiotics Urine and blood cultures are pending

## 2024-11-16 NOTE — ED Notes (Signed)
 ISTAT lactic 2.9. JRPRN

## 2024-11-16 NOTE — Sepsis Progress Note (Signed)
 Elink will follow per sepsis protocol.

## 2024-11-16 NOTE — Assessment & Plan Note (Signed)
 Wound ostomy nurse to see and evaluate Patient is on broad-spectrum antibiotics in case this is the source.

## 2024-11-16 NOTE — ED Notes (Signed)
 Correction to time. One set of blood cultures collected at 16:06pm at new IV insertion to left wrist with 20g. Blood cultures collected prior to IV antibiotic administration. Time clicked off in EPIC incorrect. JRPRN

## 2024-11-16 NOTE — H&P (Signed)
 History and Physical    Patient: Tanner Griffin FMW:[id number] DOB: [birth year] DOA: 11/16/2024 DOS: the patient was seen and examined on 11/16/2024 PCP: Tanner Kingfisher, MD  Patient coming from: Home  Chief Complaint:  Chief Complaint  Patient presents with   Hypotension   HPI: Tanner Griffin is a 83 y.o. male with medical history significant of prostate cancer, HTN who has been previously on hospice care and has an indwelling catheter due to prostate issues who is brought in today by his wife because she does not think he looks well.  She would also like him to be a full code.  In the ED he was noted to be febrile 100.8 with a very dirty urine and a catheter look like it had not been changed for months.  He had an elevated lactic acid from 2.3 up to 2.9 he was fluid resuscitated and started on Vanco and cefepime  and Flagyl.  He was also noted to have a stage IV decubitus ulcer.  Patient is he is unable to give much history and history was obtained by talking to his wife who also suffers from dementia as well as the wife's sister.  Review of Systems: As mentioned in the history of present illness. All other systems reviewed and are negative. Past Medical History:  Diagnosis Date   Arthritis    Arthrofibrosis of total knee arthroplasty, right 03/26/2014   Constipation    takes Miralax  daily as needed   Headache    occasional   HYPERLIPIDEMIA    takes Lipitor daily   HYPERTENSION    takes Amlodipine  and Labetalol  daily   Joint pain    Joint swelling    Nocturia    Osteoarthritis of left knee 03/26/2014   Pneumonia    hx of-as a child   Prostate cancer (HCC)    Urinary frequency    takes Flomax  daily   Wears eyeglasses    Wears partial dentures    top    Past Surgical History:  Procedure Laterality Date   COLONOSCOPY     HERNIA REPAIR     inguinal 04/14/17   INGUINAL HERNIA REPAIR Right 04/14/2017   Procedure: RIGHT INGUINAL HERNIA REPAIR WITH MESH;  Surgeon: Krystal Spinner,  MD;  Location: [facility];  Service: General;  Laterality: Right;   INSERTION OF MESH Right 04/14/2017   Procedure: INSERTION OF MESH;  Surgeon: Krystal Spinner, MD;  Location: [facility];  Service: General;  Laterality: Right;   KNEE CLOSED REDUCTION Right 03/26/2014   Procedure: CLOSED MANIPULATION KNEE;  Surgeon: Fonda SHAUNNA Olmsted, MD;  Location: [facility];  Service: Orthopedics;  Laterality: Right;   RADIOACTIVE SEED IMPLANT N/A 06/15/2019   Procedure: RADIOACTIVE SEED IMPLANT/BRACHYTHERAPY IMPLANT;  Surgeon: Ottelin, Mark, MD;  Location: [facility];  Service: Urology;  Laterality: N/A;   SPACE OAR INSTILLATION N/A 06/15/2019   Procedure: SPACE OAR INSTILLATION;  Surgeon: Ottelin, Mark, MD;  Location: [facility];  Service: Urology;  Laterality: N/A;   TOTAL KNEE ARTHROPLASTY  2011   right, Dr. isla per pt   TOTAL KNEE ARTHROPLASTY Left 03/26/2014   Procedure: TOTAL KNEE ARTHROPLASTY;  Surgeon: Fonda SHAUNNA Olmsted, MD;  Location: [facility] OR;  Service: Orthopedics;  Laterality: Left;   Social History:  reports that he has never smoked. He has never used smokeless tobacco. He reports that he does not drink alcohol and does not use drugs.  Allergies  Allergen Reactions   Telmisartan Other (See Comments)  Reaction:  Headache     Family History  Problem Relation Age of Onset   Brain cancer Father    Renal Disease Brother        ESRD, unknown cause   Stomach cancer Neg Hx    Esophageal cancer Neg Hx    Rectal cancer Neg Hx    Breast cancer Neg Hx    Colon cancer Neg Hx    Pancreatic cancer Neg Hx     Prior to Admission medications   Medication Sig Start Date End Date Taking? Authorizing Provider  acetaminophen  (TYLENOL ) 500 MG tablet Take 1,000 mg by mouth every 6 (six) hours as needed for mild pain.    [provider]  alendronate  (FOSAMAX ) 70 MG tablet Take 1 tablet (70 mg total) by mouth every 7 (seven) days. Take with a full glass of water  on an empty stomach.  05/19/22   Berneta Elsie Sayre, MD  amLODipine  (NORVASC ) 10 MG tablet Take 1 tablet (10 mg total) by mouth daily. 05/19/22   Berneta Elsie Sayre, MD  aspirin  EC 81 MG tablet Take 81 mg by mouth daily. Swallow whole.    [provider]  atorvastatin  (LIPITOR) 40 MG tablet Take 1 tablet (40 mg total) by mouth daily. 05/19/22   Berneta Elsie Sayre, MD  bismuth  subsalicylate (PEPTO-BISMOL) 262 MG chewable tablet Chew 2 tablets (524 mg total) by mouth as needed. 07/05/22   Berneta Elsie Sayre, MD  cephALEXin  (KEFLEX ) 500 MG capsule Take 1 capsule (500 mg total) by mouth 4 (four) times daily. Patient not taking: Reported on 03/29/2024 02/25/23   Emelia Sluder, PA-C  cholecalciferol (VITAMIN D3) 25 MCG (1000 UNIT) tablet Take 1,000 Units by mouth daily.    [provider]  cholestyramine  (QUESTRAN ) 4 g packet Take 1 packet (4 g total) by mouth 3 (three) times daily with meals. 02/05/24   Victor Lynwood DASEN, PA-C  diclofenac  Sodium (VOLTAREN ) 1 % GEL Apply a small grape sized dollop to painful joints as needed. Patient taking differently: Apply 2 g topically daily as needed (pain). Apply a small grape sized dollop to painful joints as needed. 07/05/22   Berneta Elsie Sayre, MD  loperamide  (IMODIUM ) 2 MG capsule Take 1 capsule (2 mg total) by mouth 4 (four) times daily as needed for diarrhea or loose stools. 05/30/23   Long, Fonda MATSU, MD  losartan  (COZAAR ) 25 MG tablet Take 1 tablet (25 mg total) by mouth daily. 08/14/22 11/12/22  Arlice Reichert, MD  ondansetron  (ZOFRAN -ODT) 4 MG disintegrating tablet Take 1 tablet (4 mg total) by mouth every 8 (eight) hours as needed for nausea or vomiting. 05/30/23   Long, Joshua G, MD  psyllium (METAMUCIL SMOOTH TEXTURE) 58.6 % powder Take 1 packet by mouth 3 (three) times daily. 11/05/23   Smoot, Lauraine LABOR, PA-C  tamsulosin  (FLOMAX ) 0.4 MG CAPS capsule Take 0.4 mg by mouth daily. 09/24/21   [provider]  torsemide (DEMADEX) 5 MG tablet Take 5 mg  by mouth daily. 11/04/23   [provider]    Physical Exam: Vitals:   11/16/24 1600 11/16/24 1615 11/16/24 1630 11/16/24 1835  BP:    (!) 133/59  Pulse:   64 97  Resp: 13 15  18   Temp:      TempSrc:      SpO2:   100% 100%  Weight:      Height:       Physical Examination: General appearance - chronically ill appearing and cachectic Patient opens his  eyes but does not really answer questions. Chest - clear to auscultation, no wheezes, rales or rhonchi, symmetric air entry Heart - normal rate, regular rhythm, normal S1, S2, no murmurs, rubs, clicks or gallops Skin - very dry see photo     Data Reviewed: Results for orders placed or performed during the hospital encounter of 11/16/24 (from the past 24 hours)  Resp panel by RT-PCR (RSV, Flu A&B, Covid) Anterior Nasal Swab     Status: None   Collection Time: 11/16/24  4:10 PM   Specimen: Anterior Nasal Swab  Result Value Ref Range   SARS Coronavirus 2 by RT PCR NEGATIVE NEGATIVE   Influenza A by PCR NEGATIVE NEGATIVE   Influenza B by PCR NEGATIVE NEGATIVE   Resp Syncytial Virus by PCR NEGATIVE NEGATIVE  Blood Culture (routine x 2)     Status: None (Preliminary result)   Collection Time: 11/16/24  4:11 PM   Specimen: BLOOD LEFT WRIST  Result Value Ref Range   Specimen Description      BLOOD LEFT WRIST Performed at [facility], [address], [place], [place] [address]    Special Requests      BOTTLES DRAWN AEROBIC AND ANAEROBIC Blood Culture results may not be optimal due to an inadequate volume of blood received in culture bottles Performed at [facility], [address] [place], [place] [address]    Culture PENDING    Report Status PENDING   Blood Culture (routine x 2)     Status: None (Preliminary result)   Collection Time: 11/16/24  4:16 PM   Specimen: BLOOD RIGHT WRIST  Result Value Ref Range   Specimen Description      BLOOD RIGHT WRIST Performed at [facility],  [address] [place], [place] [address]    Special Requests      BOTTLES DRAWN AEROBIC AND ANAEROBIC Blood Culture results may not be optimal due to an inadequate volume of blood received in culture bottles Performed at [facility], [address], [place], [place] [address]    Culture PENDING    Report Status PENDING   I-Stat Lactic Acid, ED     Status: Abnormal   Collection Time: 11/16/24  4:20 PM  Result Value Ref Range   Lactic Acid, Venous 2.3 (HH) 0.5 - 1.9 mmol/L   Comment NOTIFIED PHYSICIAN   CBC with Differential     Status: Abnormal   Collection Time: 11/16/24  4:29 PM  Result Value Ref Range   WBC 22.9 (H) 4.0 - 10.5 K/uL   RBC 3.20 (L) 4.22 - 5.81 MIL/uL   Hemoglobin 9.7 (L) 13.0 - 17.0 g/dL   HCT 69.0 (L) 60.9 - 47.9 %   MCV 96.6 80.0 - 100.0 fL   MCH 30.3 26.0 - 34.0 pg   MCHC 31.4 30.0 - 36.0 g/dL   RDW 83.5 (H) 88.4 - 84.4 %   Platelets 273 150 - 400 K/uL   nRBC 0.0 0.0 - 0.2 %   Neutrophils Relative % 92 %   Neutro Abs 20.9 (H) 1.7 - 7.7 K/uL   Lymphocytes Relative 3 %   Lymphs Abs 0.8 0.7 - 4.0 K/uL   Monocytes Relative 3 %   Monocytes Absolute 0.7 0.1 - 1.0 K/uL   Eosinophils Relative 0 %   Eosinophils Absolute 0.1 0.0 - 0.5 K/uL   Basophils Relative 0 %   Basophils Absolute 0.0 0.0 - 0.1 K/uL   Immature Granulocytes 2 %  Abs Immature Granulocytes 0.38 (H) 0.00 - 0.07 K/uL  Urinalysis, w/ Reflex to Culture (Infection Suspected) -Urine, Catheterized; Indwelling urinary catheter     Status: Abnormal   Collection Time: 11/16/24  4:39 PM  Result Value Ref Range   Specimen Source URINE, CATHETERIZED    Color, Urine YELLOW YELLOW   APPearance TURBID (A) CLEAR   Specific Gravity, Urine 1.010 1.005 - 1.030   pH 7.0 5.0 - 8.0   Glucose, UA NEGATIVE NEGATIVE mg/dL   Hgb urine dipstick SMALL (A) NEGATIVE   Bilirubin Urine NEGATIVE NEGATIVE   Ketones, ur NEGATIVE NEGATIVE mg/dL   Protein, ur >=699 (A) NEGATIVE mg/dL   Nitrite NEGATIVE  NEGATIVE   Leukocytes,Ua SMALL (A) NEGATIVE   RBC / HPF >50 0 - 5 RBC/hpf   WBC, UA >50 0 - 5 WBC/hpf   Bacteria, UA MANY (A) NONE SEEN   Squamous Epithelial / HPF 0-5 0 - 5 /HPF   Mucus PRESENT   Comprehensive metabolic panel with GFR     Status: Abnormal   Collection Time: 11/16/24  6:20 PM  Result Value Ref Range   Sodium 138 135 - 145 mmol/L   Potassium 4.7 3.5 - 5.1 mmol/L   Chloride 108 98 - 111 mmol/L   CO2 19 (L) 22 - 32 mmol/L   Glucose, Bld 99 70 - 99 mg/dL   BUN 24 (H) 8 - 23 mg/dL   Creatinine, Ser 7.93 (H) 0.61 - 1.24 mg/dL   Calcium  8.5 (L) 8.9 - 10.3 mg/dL   Total Protein 6.4 (L) 6.5 - 8.1 g/dL   Albumin 2.5 (L) 3.5 - 5.0 g/dL   AST 41 15 - 41 U/L   ALT 9 0 - 44 U/L   Alkaline Phosphatase 88 38 - 126 U/L   Total Bilirubin 1.3 (H) 0.0 - 1.2 mg/dL   GFR, Estimated 32 (L) >60 mL/min   Anion gap 11 5 - 15  I-Stat Lactic Acid, ED     Status: Abnormal   Collection Time: 11/16/24  6:26 PM  Result Value Ref Range   Lactic Acid, Venous 2.9 (HH) 0.5 - 1.9 mmol/L   Comment NOTIFIED PHYSICIAN   I-stat chem 8, ED     Status: Abnormal   Collection Time: 11/16/24  6:27 PM  Result Value Ref Range   Sodium 139 135 - 145 mmol/L   Potassium 5.3 (H) 3.5 - 5.1 mmol/L   Chloride 109 98 - 111 mmol/L   BUN 33 (H) 8 - 23 mg/dL   Creatinine, Ser 7.79 (H) 0.61 - 1.24 mg/dL   Glucose, Bld 896 (H) 70 - 99 mg/dL   Calcium , Ion 1.02 (L) 1.15 - 1.40 mmol/L   TCO2 22 22 - 32 mmol/L   Hemoglobin 11.2 (L) 13.0 - 17.0 g/dL   HCT 66.9 (L) 60.9 - 47.9 %   DG Chest Port 1 View Result Date: 11/16/2024 CLINICAL DATA:  Patient normal yesterday, today not speaking. Groaning with apparent pain. EXAM: PORTABLE CHEST 1 VIEW COMPARISON:  08/09/2022. FINDINGS: Cardiac silhouette is normal size.  No mediastinal or hilar masses. Clear lungs.  No pleural effusion or pneumothorax. Skeletal structures grossly intact. IMPRESSION: No active disease. Electronically Signed   By: Alm Parkins M.D.   On:  11/16/2024 16:15     Assessment and Plan: * Sepsis (HCC) Likely secondary to urinary tract versus sacral decubiti Noted with lactic acid greater than 2.9 Elevated white count at 22.9 Hypotension on admission Continue antibiotics Urine and blood cultures  are pending  Sacral decubitus ulcer, stage IV (HCC) Wound ostomy nurse to see and evaluate Patient is on broad-spectrum antibiotics in case this is the source.  Malignant neoplasm of prostate [facility]) It does not appear the patient has currently undergone treatment for this Consider palliative consult while inpatient  BPH with obstruction/lower urinary tract symptoms Continue Flomax  This catheter was changed in the ED  Essential hypertension On phone amlodipine  and losartan  These are currently being held due to sepsis and low blood pressure on admission.  Hyperlipidemia Continue atorvastatin   Type II diabetes mellitus, well controlled (HCC) Currently on no meds with normal Monitor      Advance Care Planning:   Code Status: Prior Full confirmed with family  Consults: None  Family Communication: Wife and sister-in-law at bedside  Severity of Illness: The appropriate patient status for this patient is INPATIENT. Inpatient status is judged to be reasonable and necessary in order to provide the required intensity of service to ensure the patient's safety. The patient's presenting symptoms, physical exam findings, and initial radiographic and laboratory data in the context of their chronic comorbidities is felt to place them at high risk for further clinical deterioration. Furthermore, it is not anticipated that the patient will be medically stable for discharge from the hospital within 2 midnights of admission.   * I certify that at the point of admission it is my clinical judgment that the patient will require inpatient hospital care spanning beyond 2 midnights from the point of admission due to high intensity of service, high  risk for further deterioration and high frequency of surveillance required.*  Author: Glenys GORMAN Birk, MD 11/16/2024 6:56 PM  For on call review [email or web address].

## 2024-11-16 NOTE — Assessment & Plan Note (Signed)
 Continue atorvastatin 

## 2024-11-16 NOTE — Assessment & Plan Note (Signed)
 It does not appear the patient has currently undergone treatment for this Consider palliative consult while inpatient

## 2024-11-16 NOTE — ED Provider Notes (Signed)
 [place] EMERGENCY DEPARTMENT AT [facility] Provider Note   CSN: [id number] Arrival date & time: 11/16/24  1509     Patient presents with: Hypotension   Tanner Griffin is a 83 y.o. male.   Pt is a 83 yo male with pmhx significant for hld, htn, arthritis, and chronic indwelling FC.  Pt's wife is a poor historian and the pt is unable to give any hx.  Pt is apparently on hospice, but remains a full code.  Today, his wife noticed that he was not acting right.  She called EMS and BP 78/34 upon their arrival, so they gave him 500 cc NS and BP improved.  He was 91% on RA, so he was put on 2L and O2 improved to the upper 90s.  Wife does not know when the [facility] was changed last.       Prior to Admission medications   Medication Sig Start Date End Date Taking? Authorizing Provider  acetaminophen  (TYLENOL ) 500 MG tablet Take 1,000 mg by mouth every 6 (six) hours as needed for mild pain.    [provider]  alendronate  (FOSAMAX ) 70 MG tablet Take 1 tablet (70 mg total) by mouth every 7 (seven) days. Take with a full glass of water  on an empty stomach. 05/19/22   Berneta Elsie Sayre, MD  amLODipine  (NORVASC ) 10 MG tablet Take 1 tablet (10 mg total) by mouth daily. 05/19/22   Berneta Elsie Sayre, MD  aspirin  EC 81 MG tablet Take 81 mg by mouth daily. Swallow whole.    [provider]  atorvastatin  (LIPITOR) 40 MG tablet Take 1 tablet (40 mg total) by mouth daily. 05/19/22   Berneta Elsie Sayre, MD  bismuth  subsalicylate (PEPTO-BISMOL) 262 MG chewable tablet Chew 2 tablets (524 mg total) by mouth as needed. 07/05/22   Berneta Elsie Sayre, MD  cephALEXin  (KEFLEX ) 500 MG capsule Take 1 capsule (500 mg total) by mouth 4 (four) times daily. Patient not taking: Reported on 03/29/2024 02/25/23   Emelia Sluder, PA-C  cholecalciferol (VITAMIN D3) 25 MCG (1000 UNIT) tablet Take 1,000 Units by mouth daily.    [provider]  cholestyramine  (QUESTRAN ) 4 g packet Take  1 packet (4 g total) by mouth 3 (three) times daily with meals. 02/05/24   Victor Lynwood DASEN, PA-C  diclofenac  Sodium (VOLTAREN ) 1 % GEL Apply a small grape sized dollop to painful joints as needed. Patient taking differently: Apply 2 g topically daily as needed (pain). Apply a small grape sized dollop to painful joints as needed. 07/05/22   Berneta Elsie Sayre, MD  loperamide  (IMODIUM ) 2 MG capsule Take 1 capsule (2 mg total) by mouth 4 (four) times daily as needed for diarrhea or loose stools. 05/30/23   Long, Fonda MATSU, MD  losartan  (COZAAR ) 25 MG tablet Take 1 tablet (25 mg total) by mouth daily. 08/14/22 11/12/22  Arlice Reichert, MD  ondansetron  (ZOFRAN -ODT) 4 MG disintegrating tablet Take 1 tablet (4 mg total) by mouth every 8 (eight) hours as needed for nausea or vomiting. 05/30/23   Long, Joshua G, MD  psyllium (METAMUCIL SMOOTH TEXTURE) 58.6 % powder Take 1 packet by mouth 3 (three) times daily. 11/05/23   Smoot, Lauraine LABOR, PA-C  tamsulosin  (FLOMAX ) 0.4 MG CAPS capsule Take 0.4 mg by mouth daily. 09/24/21   [provider]  torsemide (DEMADEX) 5 MG tablet Take 5 mg by mouth daily. 11/04/23   [provider]    Allergies: Telmisartan    Review of  Systems  Unable to perform ROS: Mental status change  All other systems reviewed and are negative.   Updated Vital Signs BP (!) 133/59 (BP Location: Right Arm)   Pulse 97   Temp (!) 100.8 F (38.2 C) (Rectal)   Resp 18   Ht 5' 8 (1.727 m)   Wt 62 kg   SpO2 100%   BMI 20.78 kg/m   Physical Exam Vitals and nursing note reviewed.  Constitutional:      General: He is in acute distress.     Appearance: He is underweight. He is ill-appearing.  HENT:     Head: Normocephalic and atraumatic.     Right Ear: External ear normal.     Left Ear: External ear normal.     Nose: Nose normal.     Mouth/Throat:     Mouth: Mucous membranes are dry.  Eyes:     Pupils: Pupils are equal, round, and reactive to light.  Cardiovascular:      Rate and Rhythm: Regular rhythm. Tachycardia present.     Pulses: Normal pulses.     Heart sounds: Normal heart sounds.  Pulmonary:     Effort: Pulmonary effort is normal.     Breath sounds: Normal breath sounds.  Abdominal:     General: Abdomen is flat. Bowel sounds are normal.     Palpations: Abdomen is soft.  Genitourinary:    Comments: Indwelling FC.  Urine in catheter tube looks like chocolate milk. Musculoskeletal:     Cervical back: Normal range of motion and neck supple.     Comments: Contractures upper and lower extr  Skin:    General: Skin is warm.     Capillary Refill: Capillary refill takes 2 to 3 seconds.     Comments: Decub ulcer sacrum.  See picture.  Neurological:     Mental Status: He is alert.     Comments: Pt will answer some questions with yes or no.  He will not follow commands.  Psychiatric:     Comments: Unable to assess     (all labs ordered are listed, but only abnormal results are displayed) Labs Reviewed  CBC WITH DIFFERENTIAL/PLATELET - Abnormal; Notable for the following components:      Result Value   WBC 22.9 (*)    RBC 3.20 (*)    Hemoglobin 9.7 (*)    HCT 30.9 (*)    RDW 16.4 (*)    Neutro Abs 20.9 (*)    Abs Immature Granulocytes 0.38 (*)    All other components within normal limits  URINALYSIS, W/ REFLEX TO CULTURE (INFECTION SUSPECTED) - Abnormal; Notable for the following components:   APPearance TURBID (*)    Hgb urine dipstick SMALL (*)    Protein, ur >=300 (*)    Leukocytes,Ua SMALL (*)    Bacteria, UA MANY (*)    All other components within normal limits  COMPREHENSIVE METABOLIC PANEL WITH GFR - Abnormal; Notable for the following components:   CO2 19 (*)    BUN 24 (*)    Creatinine, Ser 2.06 (*)    Calcium  8.5 (*)    Total Protein 6.4 (*)    Albumin 2.5 (*)    Total Bilirubin 1.3 (*)    GFR, Estimated 32 (*)    All other components within normal limits  I-STAT CG4 LACTIC ACID, ED - Abnormal; Notable for the  following components:   Lactic Acid, Venous 2.3 (*)    All other components within normal limits  I-STAT CG4 LACTIC ACID, ED - Abnormal; Notable for the following components:   Lactic Acid, Venous 2.9 (*)    All other components within normal limits  I-STAT CHEM 8, ED - Abnormal; Notable for the following components:   Potassium 5.3 (*)    BUN 33 (*)    Creatinine, Ser 2.20 (*)    Glucose, Bld 103 (*)    Calcium , Ion 1.02 (*)    Hemoglobin 11.2 (*)    HCT 33.0 (*)    All other components within normal limits  RESP PANEL BY RT-PCR (RSV, FLU A&B, COVID)  RVPGX2  CULTURE, BLOOD (ROUTINE X 2)  CULTURE, BLOOD (ROUTINE X 2)  URINE CULTURE  PROTIME-INR    EKG: EKG Interpretation Date/Time:  Friday November 16 2024 17:05:08 EST Ventricular Rate:  95 PR Interval:    QRS Duration:  117 QT Interval:  355 QTC Calculation: 447 R Axis:   22  Text Interpretation: Poor data quality in current ECG precludes serial comparison Confirmed by Dean Clarity [id number]) on 11/16/2024 5:41:46 PM  Radiology: [facility] Chest Port 1 View Result Date: 11/16/2024 CLINICAL DATA:  Patient normal yesterday, today not speaking. Groaning with apparent pain. EXAM: PORTABLE CHEST 1 VIEW COMPARISON:  08/09/2022. FINDINGS: Cardiac silhouette is normal size.  No mediastinal or hilar masses. Clear lungs.  No pleural effusion or pneumothorax. Skeletal structures grossly intact. IMPRESSION: No active disease. Electronically Signed   By: Alm Parkins M.D.   On: 11/16/2024 16:15     Procedures   Medications Ordered in the ED  lactated ringers  bolus 1,000 mL (0 mLs Intravenous Stopped 11/16/24 1720)    And  lactated ringers  bolus 1,000 mL (0 mLs Intravenous Stopped 11/16/24 1822)  ceFEPIme  (MAXIPIME ) 2 g in sodium chloride  0.9 % 100 mL IVPB (0 g Intravenous Stopped 11/16/24 1721)  metroNIDAZOLE  (FLAGYL ) IVPB 500 mg (0 mg Intravenous Stopped 11/16/24 1735)  vancomycin  (VANCOCIN ) IVPB 1000 mg/200 mL premix (0 mg  Intravenous Stopped 11/16/24 1822)  acetaminophen  (TYLENOL ) suppository 650 mg (650 mg Rectal Given 11/16/24 1544)  sodium chloride  0.9 % bolus 1,000 mL (1,000 mLs Intravenous New Bag/Given 11/16/24 1841)                                    Medical Decision Making Amount and/or Complexity of Data Reviewed Labs: ordered. Radiology: ordered.  Risk OTC drugs. Prescription drug management. Decision regarding hospitalization.   This patient presents to the ED for concern of ams, this involves an extensive number of treatment options, and is a complaint that carries with it a high risk of complications and morbidity.  The differential diagnosis includes sepsis, infection, electrolyte abn   Co morbidities that complicate the patient evaluation  hld, htn, arthritis, and chronic indwelling FC.   Additional history obtained:  Additional history obtained from epic chart review External records from outside source obtained and reviewed including EMS report/wife   Lab Tests:  I Ordered, and personally interpreted labs.  The pertinent results include:  cbc with wbc elevated at 22.9, hgb 9.7 (hgb 11.7 in Jan), lactic elevated at 2.3; UA + for UTI; istat bmp with k sl elevated at 5.3, bun 33 and cr 2.2 (nl in Feb)   Imaging Studies ordered:  I ordered imaging studies including cxr  I independently visualized and interpreted imaging which showed No active disease.  I agree with the radiologist interpretation   Cardiac Monitoring:  The  patient was maintained on a cardiac monitor.  I personally viewed and interpreted the cardiac monitored which showed an underlying rhythm of: st   Medicines ordered and prescription drug management:  I ordered medication including ivfs/tylenol /maxipime , flagyl, vancomycin  for sepsis  Reevaluation of the patient after these medicines showed that the patient improved I have reviewed the patients home medicines and have made adjustments as  needed   Test Considered:  ct   Critical Interventions:  Ivfs/iv abx   Consultations Obtained:  I requested consultation with the hospitalist (Dr. Fredirick),  and discussed lab and imaging findings as well as pertinent plan -she will admit   Problem List / ED Course:  Sepsis:  pt given IVFs and IV abx.   BP and HR improved with IVFS.  HR is irregular.  EKGs are still not great   sepsis likely due to UTI.  Pt remains critically ill.  Family still wants him full code. Sacral decub:  wound care will be needed AKI:  ivfs given   Reevaluation:  After the interventions noted above, I reevaluated the patient and found that they have :improved   Social Determinants of Health:  Lives at home   Dispostion:  After consideration of the diagnostic results and the patients response to treatment, I feel that the patent would benefit from admission.  CRITICAL CARE Performed by: Mliss Boyers   Total critical care time: 45 minutes  Critical care time was exclusive of separately billable procedures and treating other patients.  Critical care was necessary to treat or prevent imminent or life-threatening deterioration.  Critical care was time spent personally by me on the following activities: development of treatment plan with patient and/or surrogate as well as nursing, discussions with consultants, evaluation of patient's response to treatment, examination of patient, obtaining history from patient or surrogate, ordering and performing treatments and interventions, ordering and review of laboratory studies, ordering and review of radiographic studies, pulse oximetry and re-evaluation of patient's condition.        Final diagnoses:  Sepsis with encephalopathy without septic shock, due to unspecified organism (HCC)  Pressure injury of sacral region, stage 4 (HCC)  Urinary tract infection associated with indwelling urethral catheter, initial encounter  AKI (acute kidney injury)   Dehydration    ED Discharge Orders     None          Boyers Mliss, MD 11/16/24 1857

## 2024-11-17 DIAGNOSIS — G9341 Metabolic encephalopathy: Secondary | ICD-10-CM

## 2024-11-17 DIAGNOSIS — R6520 Severe sepsis without septic shock: Secondary | ICD-10-CM

## 2024-11-17 DIAGNOSIS — A419 Sepsis, unspecified organism: Secondary | ICD-10-CM

## 2024-11-17 LAB — COMPREHENSIVE METABOLIC PANEL WITH GFR
ALT: 12 U/L (ref 0–44)
AST: 44 U/L — ABNORMAL HIGH (ref 15–41)
Albumin: 2.2 g/dL — ABNORMAL LOW (ref 3.5–5.0)
Alkaline Phosphatase: 75 U/L (ref 38–126)
Anion gap: 7 (ref 5–15)
BUN: 27 mg/dL — ABNORMAL HIGH (ref 8–23)
CO2: 23 mmol/L (ref 22–32)
Calcium: 8.2 mg/dL — ABNORMAL LOW (ref 8.9–10.3)
Chloride: 107 mmol/L (ref 98–111)
Creatinine, Ser: 1.15 mg/dL (ref 0.61–1.24)
GFR, Estimated: >60 mL/min (ref >60)
Glucose, Bld: 107 mg/dL — ABNORMAL HIGH (ref 70–99)
Potassium: 3.1 mmol/L — ABNORMAL LOW (ref 3.5–5.1)
Sodium: 136 mmol/L (ref 135–145)
Total Bilirubin: 0.7 mg/dL (ref 0.0–1.2)
Total Protein: 5.7 g/dL — ABNORMAL LOW (ref 6.5–8.1)

## 2024-11-17 LAB — BLOOD CULTURE ID PANEL (REFLEXED) - BCID2
A.calcoaceticus-baumannii: NOT DETECTED
Bacteroides fragilis: NOT DETECTED
CTX-M ESBL: NOT DETECTED
Candida albicans: NOT DETECTED
Candida auris: NOT DETECTED
Candida glabrata: NOT DETECTED
Candida krusei: NOT DETECTED
Candida parapsilosis: NOT DETECTED
Candida tropicalis: NOT DETECTED
Carbapenem resist OXA 48 LIKE: NOT DETECTED
Carbapenem resistance IMP: NOT DETECTED
Carbapenem resistance KPC: NOT DETECTED
Carbapenem resistance NDM: NOT DETECTED
Carbapenem resistance VIM: NOT DETECTED
Cryptococcus neoformans/gattii: NOT DETECTED
Enterobacter cloacae complex: NOT DETECTED
Enterobacterales: DETECTED — AB
Enterococcus Faecium: NOT DETECTED
Enterococcus faecalis: NOT DETECTED
Escherichia coli: NOT DETECTED
Haemophilus influenzae: NOT DETECTED
Klebsiella aerogenes: NOT DETECTED
Klebsiella oxytoca: NOT DETECTED
Klebsiella pneumoniae: NOT DETECTED
Listeria monocytogenes: NOT DETECTED
Methicillin resistance mecA/C: DETECTED — AB
Neisseria meningitidis: NOT DETECTED
Proteus species: DETECTED — AB
Pseudomonas aeruginosa: NOT DETECTED
Salmonella species: NOT DETECTED
Serratia marcescens: NOT DETECTED
Staphylococcus aureus (BCID): NOT DETECTED
Staphylococcus epidermidis: DETECTED — AB
Staphylococcus lugdunensis: NOT DETECTED
Staphylococcus species: DETECTED — AB
Stenotrophomonas maltophilia: NOT DETECTED
Streptococcus agalactiae: NOT DETECTED
Streptococcus pneumoniae: NOT DETECTED
Streptococcus pyogenes: NOT DETECTED
Streptococcus species: NOT DETECTED

## 2024-11-17 LAB — CBC
HCT: 24.7 % — ABNORMAL LOW (ref 39.0–52.0)
Hemoglobin: 7.9 g/dL — ABNORMAL LOW (ref 13.0–17.0)
MCH: 31 pg (ref 26.0–34.0)
MCHC: 32 g/dL (ref 30.0–36.0)
MCV: 96.9 fL (ref 80.0–100.0)
Platelets: 214 K/uL (ref 150–400)
RBC: 2.55 MIL/uL — ABNORMAL LOW (ref 4.22–5.81)
RDW: 16.1 % — ABNORMAL HIGH (ref 11.5–15.5)
WBC: 22.2 K/uL — ABNORMAL HIGH (ref 4.0–10.5)
nRBC: 0 % (ref 0.0–0.2)

## 2024-11-17 LAB — GLUCOSE, CAPILLARY
Glucose-Capillary: 81 mg/dL (ref 70–99)
Glucose-Capillary: 75 mg/dL (ref 70–99)

## 2024-11-17 LAB — MAGNESIUM: Magnesium: 1.7 mg/dL (ref 1.7–2.4)

## 2024-11-17 MED ORDER — VANCOMYCIN HCL IN DEXTROSE 1-5 GM/200ML-% IV SOLN: 1000.0000 mg | INTRAVENOUS | Status: DC

## 2024-11-17 MED ORDER — INSULIN ASPART 100 UNIT/ML IJ SOLN
0.0000 [IU] | Freq: Three times a day (TID) | INTRAMUSCULAR | Status: DC
  Filled 2024-11-17: qty 1

## 2024-11-17 MED ORDER — SODIUM CHLORIDE 0.9 % IV SOLN
2.0000 g | INTRAVENOUS | Status: DC
  Administered 2024-11-17: 2 g via INTRAVENOUS
  Filled 2024-11-17: qty 20

## 2024-11-17 NOTE — Progress Notes (Signed)
 PROGRESS NOTE    AMMAAR ENCINA  FMW:[id number] DOB: [birth year] DOA: 11/16/2024 PCP: Haze Kingfisher, MD   Brief Narrative: This is a 83 year old male who lives at home with his family had a fall in June since then he has been bedridden and has not walked much has a history of prostate cancer hypertension has a chronic Foley.  Apparently the Foley catheter was changed by hospice last week per family.  Patient is not able to provide me any history.  He was admitted with concern for urinary tract infection and dehydration.  He was found to have an elevated lactate level started on Vanco cefepime  and Flagyl.  He also has a stage IV sacral decubitus.  He is seen by wound care.  Blood culture grew Staph epidermidis, Proteus, gram-negative Enterobacter, methicillin resistance detected, pending final.  Assessment & Plan:   Principal Problem:   Sepsis (HCC) Active Problems:   Type II diabetes mellitus, well controlled (HCC)   Hyperlipidemia   Essential hypertension   BPH with obstruction/lower urinary tract symptoms   Malignant neoplasm of prostate (HCC)   Sacral decubitus ulcer, stage IV (HCC)   #1 sepsis secondary to urinary tract infection present on admission.  Patient has history of prostate cancer chronic Foley in place and is bedridden.  At time of admission it was noted he had hypotension lactic acidosis and leukocytosis.  Initially started on Vanco Flagyl and cefepime .  Changed to Rocephin  2 g today.  Foley catheter was changed by hospice at home last week per family.  #2 stage IV sacral decub seen by wound care,Cleanse sacral wound with Vashe, do not rinse. Using a Q tip applicator insert Vashe moistened kerlix roll gauze into wound bed making sure to cover area of depth, cover with dry gauze and ABD pad and tape or silicone foam whichever is preferred.  Cleanse R elbow wound with Vashe, do not rinse. Apply silver  hydrofiber Soila [phone] [employer]) to wound bed daily and secure  with silicone foam or Kerlix roll gauze whichever is preferred.  SOAK SILVER  WITH NORMAL SALINE IF ADHERED TO WOUND BED FOR ATRAUMATIC REMOVAL.  #3 malignancy of prostate currently on no treatments/BPH patient was on hospice until this admission.  Family does not want hospice and wants to be full code. Continue Flomax .  #4 history of essential hypertension on Norvasc  and losartan  prior to admission these were held due to soft BP on admission.  #5 hyperlipidemia on statin  #6 type 2 diabetes CBG (last 3)  No results for input(s): GLUCAP in the last 72 hours.   #7 hyperkalemia potassium 5.3 Follow-up pending  Wound Pressure Injury Elbow Posterior;Right Stage 3 -  Full thickness tissue loss. Subcutaneous fat may be visible but bone, tendon or muscle are NOT exposed. (Active)    Estimated body mass index is 20.78 kg/m as calculated from the following:   Height as of this encounter: 5' 8 (1.727 m).   Weight as of this encounter: 62 kg.  DVT prophylaxis: Lovenox  Code Status: Full code Family Communication: Discussed with patient's daughter  disposition Plan:  Status is: Inpatient Remains inpatient appropriate because: Acute illness   Consultants:  None  Procedures: None rest Antimicrobials:-2 g Rocephin   Subjective: Patient resting in bed on 2 L of oxygen no new events overnight Foley in place draining clear urine denies any pain or discomfort  Objective: Vitals:   11/16/24 2100 11/17/24 0034 11/17/24 0402 11/17/24 0821  BP:  129/73 123/67 118/71  Pulse:  97  87 90  Resp: 14 15 15 18   Temp:  98.3 F (36.8 C) 98.6 F (37 C) 97.6 F (36.4 C)  TempSrc:      SpO2:   100% 100%  Weight:      Height:        Intake/Output Summary (Last 24 hours) at 11/17/2024 1229 Last data filed at 11/17/2024 0959 Gross per 24 hour  Intake 3209.55 ml  Output 1050 ml  Net 2159.55 ml   Filed Weights   11/16/24 1515  Weight: 62 kg    Examination:  General exam: Appears  chronically ill-appearing Respiratory system: Clear to auscultation. Respiratory effort normal. Cardiovascular system: Regular no tachycardia Gastrointestinal system: Abdomen is nondistended, soft and nontender. No organomegaly or masses felt. Normal bowel sounds heard. Central nervous system: Awake  extremities: No edema  Data Reviewed: I have personally reviewed following labs and imaging studies  CBC: Recent Labs  Lab 11/16/24 1629 11/16/24 1827  WBC 22.9*  --   NEUTROABS 20.9*  --   HGB 9.7* 11.2*  HCT 30.9* 33.0*  MCV 96.6  --   PLT 273  --    Basic Metabolic Panel: Recent Labs  Lab 11/16/24 1820 11/16/24 1827 11/16/24 2155  NA 138 139  --   K 4.7 5.3*  --   CL 108 109  --   CO2 19*  --   --   GLUCOSE 99 103*  --   BUN 24* 33*  --   CREATININE 2.06* 2.20* 1.74*  CALCIUM  8.5*  --   --    GFR: Estimated Creatinine Clearance: 29.2 mL/min (A) (by C-G formula based on SCr of 1.74 mg/dL (H)). Liver Function Tests: Recent Labs  Lab 11/16/24 1820  AST 41  ALT 9  ALKPHOS 88  BILITOT 1.3*  PROT 6.4*  ALBUMIN 2.5*   No results for input(s): LIPASE, AMYLASE in the last 168 hours. No results for input(s): AMMONIA in the last 168 hours. Coagulation Profile: Recent Labs  Lab 11/16/24 2155  INR 1.5*   Cardiac Enzymes: No results for input(s): CKTOTAL, CKMB, CKMBINDEX, TROPONINI in the last 168 hours. BNP (last 3 results) No results for input(s): PROBNP in the last 8760 hours. HbA1C: No results for input(s): HGBA1C in the last 72 hours. CBG: No results for input(s): GLUCAP in the last 168 hours. Lipid Profile: No results for input(s): CHOL, HDL, LDLCALC, TRIG, CHOLHDL, LDLDIRECT in the last 72 hours. Thyroid  Function Tests: No results for input(s): TSH, T4TOTAL, FREET4, T3FREE, THYROIDAB in the last 72 hours. Anemia Panel: No results for input(s): VITAMINB12, FOLATE, FERRITIN, TIBC, IRON, RETICCTPCT in the  last 72 hours. Sepsis Labs: Recent Labs  Lab 11/16/24 1620 11/16/24 1826  LATICACIDVEN 2.3* 2.9*    Recent Results (from the past 240 hours)  Resp panel by RT-PCR (RSV, Flu A&B, Covid) Anterior Nasal Swab     Status: None   Collection Time: 11/16/24  4:10 PM   Specimen: Anterior Nasal Swab  Result Value Ref Range Status   SARS Coronavirus 2 by RT PCR NEGATIVE NEGATIVE Final    Comment: (NOTE) SARS-CoV-2 target nucleic acids are NOT DETECTED.  The SARS-CoV-2 RNA is generally detectable in upper respiratory specimens during the acute phase of infection. The lowest concentration of SARS-CoV-2 viral copies this assay can detect is 138 copies/mL. A negative result does not preclude SARS-Cov-2 infection and should not be used as the sole basis for treatment or other patient management decisions. A negative result may occur with  improper specimen collection/handling, submission of specimen other than nasopharyngeal swab, presence of viral mutation(s) within the areas targeted by this assay, and inadequate number of viral copies(<138 copies/mL). A negative result must be combined with clinical observations, patient history, and epidemiological information. The expected result is Negative.  Fact Sheet for Patients:  bloggercourse.com  Fact Sheet for Healthcare Providers:  seriousbroker.it  This test is no t yet approved or cleared by the [place]  FDA and  has been authorized for detection and/or diagnosis of SARS-CoV-2 by FDA under an Emergency Use Authorization (EUA). This EUA will remain  in effect (meaning this test can be used) for the duration of the COVID-19 declaration under Section 564(b)(1) of the Act, 21 U.S.C.section 360bbb-3(b)(1), unless the authorization is terminated  or revoked sooner.       Influenza A by PCR NEGATIVE NEGATIVE Final   Influenza B by PCR NEGATIVE NEGATIVE Final    Comment: (NOTE) The Xpert  Xpress SARS-CoV-2/FLU/RSV plus assay is intended as an aid in the diagnosis of influenza from Nasopharyngeal swab specimens and should not be used as a sole basis for treatment. Nasal washings and aspirates are unacceptable for Xpert Xpress SARS-CoV-2/FLU/RSV testing.  Fact Sheet for Patients: bloggercourse.com  Fact Sheet for Healthcare Providers: seriousbroker.it  This test is not yet approved or cleared by the [place]  FDA and has been authorized for detection and/or diagnosis of SARS-CoV-2 by FDA under an Emergency Use Authorization (EUA). This EUA will remain in effect (meaning this test can be used) for the duration of the COVID-19 declaration under Section 564(b)(1) of the Act, 21 U.S.C. section 360bbb-3(b)(1), unless the authorization is terminated or revoked.     Resp Syncytial Virus by PCR NEGATIVE NEGATIVE Final    Comment: (NOTE) Fact Sheet for Patients: bloggercourse.com  Fact Sheet for Healthcare Providers: seriousbroker.it  This test is not yet approved or cleared by the [place]  FDA and has been authorized for detection and/or diagnosis of SARS-CoV-2 by FDA under an Emergency Use Authorization (EUA). This EUA will remain in effect (meaning this test can be used) for the duration of the COVID-19 declaration under Section 564(b)(1) of the Act, 21 U.S.C. section 360bbb-3(b)(1), unless the authorization is terminated or revoked.  Performed at [facility], [address] [place], [place] [address]   Blood Culture (routine x 2)     Status: None (Preliminary result)   Collection Time: 11/16/24  4:11 PM   Specimen: BLOOD LEFT WRIST  Result Value Ref Range Status   Specimen Description   Final    BLOOD LEFT WRIST Performed at [facility], [address], [place], [place] [address]    Special Requests   Final    BOTTLES DRAWN  AEROBIC AND ANAEROBIC Blood Culture results may not be optimal due to an inadequate volume of blood received in culture bottles Performed at [facility], [address] [place], [place] [address]    Culture  Setup Time   Final    GRAM NEGATIVE RODS ANAEROBIC BOTTLE ONLY CRITICAL RESULT CALLED TO, READ BACK BY AND VERIFIED WITH: PHARMD CHRISTINE SHADE [phone] AT 1120 BY EC Performed at [facility], [address], [place], [place] [address]    Culture GRAM NEGATIVE RODS  Final   Report Status PENDING  Incomplete  Blood Culture ID Panel (Reflexed)     Status: Abnormal   Collection Time: 11/16/24  4:11 PM  Result Value Ref Range Status   Enterococcus  faecalis NOT DETECTED NOT DETECTED Final   Enterococcus Faecium NOT DETECTED NOT DETECTED Final   Listeria monocytogenes NOT DETECTED NOT DETECTED Final   Staphylococcus species DETECTED (A) NOT DETECTED Final    Comment: CRITICAL RESULT CALLED TO, READ BACK BY AND VERIFIED WITH: PHARMD CHRISTINE SHADE [id number] AT 1120 BY EC    Staphylococcus aureus (BCID) NOT DETECTED NOT DETECTED Final   Staphylococcus epidermidis DETECTED (A) NOT DETECTED Final    Comment: Methicillin (oxacillin) resistant coagulase negative staphylococcus. Possible blood culture contaminant (unless isolated from more than one blood culture draw or clinical case suggests pathogenicity). No antibiotic treatment is indicated for blood  culture contaminants. CRITICAL RESULT CALLED TO, READ BACK BY AND VERIFIED WITH: PHARMD CHRISTINE SHADE [id number] AT 1120 BY EC    Staphylococcus lugdunensis NOT DETECTED NOT DETECTED Final   Streptococcus species NOT DETECTED NOT DETECTED Final   Streptococcus agalactiae NOT DETECTED NOT DETECTED Final   Streptococcus pneumoniae NOT DETECTED NOT DETECTED Final   Streptococcus pyogenes NOT DETECTED NOT DETECTED Final   A.calcoaceticus-baumannii NOT DETECTED NOT DETECTED Final   Bacteroides fragilis NOT DETECTED  NOT DETECTED Final   Enterobacterales DETECTED (A) NOT DETECTED Final    Comment: Enterobacterales represent a large order of gram negative bacteria, not a single organism. CRITICAL RESULT CALLED TO, READ BACK BY AND VERIFIED WITH: PHARMD CHRISTINE SHADE [id number] AT 1120 BY EC    Enterobacter cloacae complex NOT DETECTED NOT DETECTED Final   Escherichia coli NOT DETECTED NOT DETECTED Final   Klebsiella aerogenes NOT DETECTED NOT DETECTED Final   Klebsiella oxytoca NOT DETECTED NOT DETECTED Final   Klebsiella pneumoniae NOT DETECTED NOT DETECTED Final   Proteus species DETECTED (A) NOT DETECTED Final    Comment: CRITICAL RESULT CALLED TO, READ BACK BY AND VERIFIED WITH: PHARMD CHRISTINE SHADE [phone] AT 1120 BY EC    Salmonella species NOT DETECTED NOT DETECTED Final   Serratia marcescens NOT DETECTED NOT DETECTED Final   Haemophilus influenzae NOT DETECTED NOT DETECTED Final   Neisseria meningitidis NOT DETECTED NOT DETECTED Final   Pseudomonas aeruginosa NOT DETECTED NOT DETECTED Final   Stenotrophomonas maltophilia NOT DETECTED NOT DETECTED Final   Candida albicans NOT DETECTED NOT DETECTED Final   Candida auris NOT DETECTED NOT DETECTED Final   Candida glabrata NOT DETECTED NOT DETECTED Final   Candida krusei NOT DETECTED NOT DETECTED Final   Candida parapsilosis NOT DETECTED NOT DETECTED Final   Candida tropicalis NOT DETECTED NOT DETECTED Final   Cryptococcus neoformans/gattii NOT DETECTED NOT DETECTED Final   CTX-M ESBL NOT DETECTED NOT DETECTED Final   Carbapenem resistance IMP NOT DETECTED NOT DETECTED Final   Carbapenem resistance KPC NOT DETECTED NOT DETECTED Final   Methicillin resistance mecA/C DETECTED (A) NOT DETECTED Final    Comment: CRITICAL RESULT CALLED TO, READ BACK BY AND VERIFIED WITH: PHARMD CHRISTINE SHADE [id number] AT 1120 BY EC    Carbapenem resistance NDM NOT DETECTED NOT DETECTED Final   Carbapenem resist OXA 48 LIKE NOT DETECTED NOT DETECTED Final    Carbapenem resistance VIM NOT DETECTED NOT DETECTED Final    Comment: Performed at [facility], [address], [place], [place] [address]  Blood Culture (routine x 2)     Status: None (Preliminary result)   Collection Time: 11/16/24  4:16 PM   Specimen: BLOOD RIGHT WRIST  Result Value Ref Range Status   Specimen Description   Final    BLOOD RIGHT WRIST Performed at [facility], [address] [place], [place] [address]    Special Requests   Final    BOTTLES DRAWN AEROBIC AND ANAEROBIC Blood Culture results may not be optimal due to an inadequate volume of blood received in culture bottles Performed at [facility], [address], [place], [place] [address]    Culture   Final    NO GROWTH < 24 HOURS Performed at [facility], [address] [place], [place] [address]    Report Status PENDING  Incomplete     Radiology Studies: DG Chest Port 1 View Result Date: 11/16/2024 CLINICAL DATA:  Patient normal yesterday, today not speaking. Groaning with apparent pain. EXAM: PORTABLE CHEST 1 VIEW COMPARISON:  08/09/2022. FINDINGS: Cardiac silhouette is normal size.  No mediastinal or hilar masses. Clear lungs.  No pleural effusion or pneumothorax. Skeletal structures grossly intact. IMPRESSION: No active disease. Electronically Signed   By: Alm Parkins M.D.   On: 11/16/2024 16:15    Scheduled Meds:  aspirin  EC  81 mg Oral Daily   Chlorhexidine  Gluconate Cloth  6 each Topical Daily   heparin  5,000 Units Subcutaneous Q8H   tamsulosin   0.4 mg Oral Daily   Continuous Infusions:  cefTRIAXone  (ROCEPHIN )  IV     lactated ringers  75 mL/hr at 11/16/24 1942     LOS: 1 day   Almarie KANDICE Hoots, MD 11/17/2024, 12:29 PM

## 2024-11-17 NOTE — Progress Notes (Signed)
 PHARMACY - PHYSICIAN COMMUNICATION CRITICAL VALUE ALERT - BLOOD CULTURE IDENTIFICATION (BCID)  Tanner Griffin is an 83 y.o. male who presented to [facility] on 11/16/2024 with a chief complaint of sepsis with multiple possible sources including chronic foley, decub ulcer.  He was previously on hospice, but transitioned to full code on admission.    Assessment:  BCx: GNR, and BCID shows Proteus, no ESBL resistance detected.  Also reported methicillin resistant, coagulase negative, staphylococcus epi, but no GPC were seen on gram stain.  Possible blood culture contaminant.    Name of physician (or Provider) Contacted: Dr Alvia  Current antibiotics: Cefepime , metronidazole, vancomycin  Changes to prescribed antibiotics recommended:  Recommendations accepted by provider Change antibiotics to Ceftriaxone  2g IV q24h  Results for orders placed or performed during the hospital encounter of 11/16/24  Blood Culture ID Panel (Reflexed) (Collected: 11/16/2024  4:11 PM)  Result Value Ref Range   Enterococcus faecalis NOT DETECTED NOT DETECTED   Enterococcus Faecium NOT DETECTED NOT DETECTED   Listeria monocytogenes NOT DETECTED NOT DETECTED   Staphylococcus species DETECTED (A) NOT DETECTED   Staphylococcus aureus (BCID) NOT DETECTED NOT DETECTED   Staphylococcus epidermidis DETECTED (A) NOT DETECTED   Staphylococcus lugdunensis NOT DETECTED NOT DETECTED   Streptococcus species NOT DETECTED NOT DETECTED   Streptococcus agalactiae NOT DETECTED NOT DETECTED   Streptococcus pneumoniae NOT DETECTED NOT DETECTED   Streptococcus pyogenes NOT DETECTED NOT DETECTED   A.calcoaceticus-baumannii NOT DETECTED NOT DETECTED   Bacteroides fragilis NOT DETECTED NOT DETECTED   Enterobacterales DETECTED (A) NOT DETECTED   Enterobacter cloacae complex NOT DETECTED NOT DETECTED   Escherichia coli NOT DETECTED NOT DETECTED   Klebsiella aerogenes NOT DETECTED NOT DETECTED   Klebsiella oxytoca NOT DETECTED NOT  DETECTED   Klebsiella pneumoniae NOT DETECTED NOT DETECTED   Proteus species DETECTED (A) NOT DETECTED   Salmonella species NOT DETECTED NOT DETECTED   Serratia marcescens NOT DETECTED NOT DETECTED   Haemophilus influenzae NOT DETECTED NOT DETECTED   Neisseria meningitidis NOT DETECTED NOT DETECTED   Pseudomonas aeruginosa NOT DETECTED NOT DETECTED   Stenotrophomonas maltophilia NOT DETECTED NOT DETECTED   Candida albicans NOT DETECTED NOT DETECTED   Candida auris NOT DETECTED NOT DETECTED   Candida glabrata NOT DETECTED NOT DETECTED   Candida krusei NOT DETECTED NOT DETECTED   Candida parapsilosis NOT DETECTED NOT DETECTED   Candida tropicalis NOT DETECTED NOT DETECTED   Cryptococcus neoformans/gattii NOT DETECTED NOT DETECTED   CTX-M ESBL NOT DETECTED NOT DETECTED   Carbapenem resistance IMP NOT DETECTED NOT DETECTED   Carbapenem resistance KPC NOT DETECTED NOT DETECTED   Methicillin resistance mecA/C DETECTED (A) NOT DETECTED   Carbapenem resistance NDM NOT DETECTED NOT DETECTED   Carbapenem resist OXA 48 LIKE NOT DETECTED NOT DETECTED   Carbapenem resistance VIM NOT DETECTED NOT DETECTED     Wanda Hasting PharmD, BCPS [facility] main pharmacy [phone] 11/17/2024 11:21 AM

## 2024-11-17 NOTE — Progress Notes (Addendum)
 PHARMACY - PHYSICIAN COMMUNICATION CRITICAL VALUE ALERT - BLOOD CULTURE IDENTIFICATION (BCID)  Tanner Griffin is an 83 y.o. male who presented to [facility] on 11/16/2024 with a chief complaint of sepsis with multiple possible sources including chronic foley, decub ulcer.  He was previously on hospice, but transitioned to full code on admission.    Assessment:  BCx: GNR, and BCID shows Proteus, no ESBL resistance detected.  Also reported methicillin resistant, coagulase negative, staphylococcus epi now updated to 2 out of 4 BC.  Name of physician (or Provider) Contacted: Dr Alvia  Current antibiotics: Rocephin   Changes to prescribed antibiotics recommended:  Recommendations accepted by provider Change antibiotics to Ceftriaxone  2g IV q24h Resume Vancomycin  Results for orders placed or performed during the hospital encounter of 11/16/24  Blood Culture ID Panel (Reflexed) (Collected: 11/16/2024  4:11 PM)  Result Value Ref Range   Enterococcus faecalis NOT DETECTED NOT DETECTED   Enterococcus Faecium NOT DETECTED NOT DETECTED   Listeria monocytogenes NOT DETECTED NOT DETECTED   Staphylococcus species DETECTED (A) NOT DETECTED   Staphylococcus aureus (BCID) NOT DETECTED NOT DETECTED   Staphylococcus epidermidis DETECTED (A) NOT DETECTED   Staphylococcus lugdunensis NOT DETECTED NOT DETECTED   Streptococcus species NOT DETECTED NOT DETECTED   Streptococcus agalactiae NOT DETECTED NOT DETECTED   Streptococcus pneumoniae NOT DETECTED NOT DETECTED   Streptococcus pyogenes NOT DETECTED NOT DETECTED   A.calcoaceticus-baumannii NOT DETECTED NOT DETECTED   Bacteroides fragilis NOT DETECTED NOT DETECTED   Enterobacterales DETECTED (A) NOT DETECTED   Enterobacter cloacae complex NOT DETECTED NOT DETECTED   Escherichia coli NOT DETECTED NOT DETECTED   Klebsiella aerogenes NOT DETECTED NOT DETECTED   Klebsiella oxytoca NOT DETECTED NOT DETECTED   Klebsiella pneumoniae NOT DETECTED NOT  DETECTED   Proteus species DETECTED (A) NOT DETECTED   Salmonella species NOT DETECTED NOT DETECTED   Serratia marcescens NOT DETECTED NOT DETECTED   Haemophilus influenzae NOT DETECTED NOT DETECTED   Neisseria meningitidis NOT DETECTED NOT DETECTED   Pseudomonas aeruginosa NOT DETECTED NOT DETECTED   Stenotrophomonas maltophilia NOT DETECTED NOT DETECTED   Candida albicans NOT DETECTED NOT DETECTED   Candida auris NOT DETECTED NOT DETECTED   Candida glabrata NOT DETECTED NOT DETECTED   Candida krusei NOT DETECTED NOT DETECTED   Candida parapsilosis NOT DETECTED NOT DETECTED   Candida tropicalis NOT DETECTED NOT DETECTED   Cryptococcus neoformans/gattii NOT DETECTED NOT DETECTED   CTX-M ESBL NOT DETECTED NOT DETECTED   Carbapenem resistance IMP NOT DETECTED NOT DETECTED   Carbapenem resistance KPC NOT DETECTED NOT DETECTED   Methicillin resistance mecA/C DETECTED (A) NOT DETECTED   Carbapenem resistance NDM NOT DETECTED NOT DETECTED   Carbapenem resist OXA 48 LIKE NOT DETECTED NOT DETECTED   Carbapenem resistance VIM NOT DETECTED NOT DETECTED     Wanda Hasting PharmD, BCPS [facility] main pharmacy [phone] 11/17/2024 2:05 PM

## 2024-11-17 NOTE — Progress Notes (Addendum)
 Tanner Griffin [address] [facility] Liaison Note:   Tanner Griffin is a current hospice patient with [facility] with a terminal diagnosis of Senile Degeneration of Brain. Family alerted [facility] that patient  was not responding. Home visit completed by nurse. Patient was unresponsive and hypotensive. Family requested transfer to ED for evaluation. Patient was transported to ED from home. Patient is admitted for sepsis. Per Dr. Odella Pepper with [facility] this is a related hospital admission. Patient is a Full Code.   Visited patient at bedside. He was resting comfortably with eyes closed. To promote comfort I did not wake him. Spoke with nurse caring for patient today. She states that daughter was at bedside earlier. Patient appears to be free of discomfort. Has been sleeping on and off most of the morning. Will occasionally yell out when he wants something to drink and staff is able to assist with this. Was seen by Wound Specialist on yesterday. Per note, sacral wound stage 4 with odor present and right elbow is a stage 3. Orders given to nursing for dressing changes. Air Mattress bed has been ordered.       Per chart review, patient remains GIP appropriate as he requires IV antibiotics.    Vital Signs: 97.6/90/18    118/71  100% 2L [place]   I/O: 2849.05/1049   Abnormal Labs:  11/16/24 16:20 Lactic Acid, Venous: 2.3 (HH) 11/16/24 16:29 WBC: 22.9 (H) RBC: 3.20 (L) Hemoglobin: 9.7 (L) HCT: 30.9 (L) MCV: 96.6 MCH: 30.3 MCHC: 31.4 RDW: 16.4 (H) Platelets: 273 nRBC: 0.0 Neutrophils: 92 Lymphocytes: 3 Monocytes Relative: 3 Eosinophil: 0 Basophil: 0 Immature Granulocytes: 2 NEUT#: 20.9 (H) Lymphs Abs: 0.8 Monocyte #: 0.7 Eosinophils Absolute: 0.1 Basophils Absolute: 0.0 Abs Immature Granulocytes: 0.38 (H)   11/16/24 18:20 Comprehensive metabolic panel with GFR: Rpt ! CO2: 19 (L) BUN: 24 (H) Creatinine: 2.06 (H) Calcium : 8.5 (L) Albumin: 2.5  (L) Total Protein: 6.4 (L) Total Bilirubin: 1.3 (H) GFR, Estimated: 32 (L)  11/16/24 18:26 Lactic Acid, Venous: 2.9 (HH)   11/16/24 18:27 Potassium: 5.3 (H) Glucose: 103 (H) BUN: 33 (H) Creatinine: 2.20 (H) Calcium  Ionized: 1.02 (L) Hemoglobin: 11.2 (L) HCT: 33.0 (L)   11/16/24 21:55 Creatinine: 1.74 (H) GFR, Estimated: 39 (L) Prothrombin Time: 18.8 (H) INR: 1.5 (H)  Diagnostics:  DG Chest Port 1 View  Date: 11/16/2024 Department: [facility] [place]  Narrative & Impression CLINICAL DATA:  Patient normal yesterday, today not speaking. Groaning with apparent pain.   IMPRESSION: No active disease.     Electronically Signed   By: Alm Parkins M.D.   On: 11/16/2024 16:15  11/16/24 16:10 Resp panel by RT-PCR (RSV, Flu A&B, Covid): Rpt Influenza A By PCR: NEGATIVE Influenza B By PCR: NEGATIVE Respiratory Syncytial Virus by PCR: NEGATIVE SARS Coronavirus 2 by RT PCR: NEGATIVE  11/16/24 16:11 Culture, blood (Routine X 2) PENDING  11/16/24 16:39 Urinalysis, w/ Reflex to Culture (Infection Suspected): Rpt ! Appearance: TURBID ! Hgb urine dipstick: SMALL ! Leukocytes,Ua: SMALL ! Protein: >=300 ! Specific Gravity, Urine: 1.010 Specimen Source: URINE, CATHETERIZED Bacteria, UA: MANY ! Mucus: PRESENT RBC / HPF: >50 Squamous Epithelial / HPF: 0-5 WBC, UA: >50  Urine Culture: Rpt (IP)- PENDING   IV/PRN Meds:  Tylenol  Suppository 650mg  x1, Cefepime  2g IV x1, Lactated Ringers  Bolus 1000mL IV x2, Lactated Ringers  Infusion 75ml/hr IV continuous, Flagyl 500mg  IV x2, Sodium Chloride  0.9% bolus 1000mL IV x1, Vancomycin 1000mg  IV x1   Assessment and Plan per [name], MD  11.29.25 Assessment & Plan:  Principal Problem:   Sepsis (HCC) Active Problems:   Type II diabetes mellitus, well controlled (HCC)   Hyperlipidemia   Essential hypertension   BPH with obstruction/lower urinary tract symptoms   Malignant neoplasm of prostate (HCC)   Sacral  decubitus ulcer, stage IV (HCC)     #1 sepsis secondary to urinary tract infection present on admission.  Patient has history of prostate cancer chronic Foley in place and is bedridden.  At time of admission it was noted he had hypotension lactic acidosis and leukocytosis.  Initially started on Vanco Flagyl and cefepime .  Changed to Rocephin  2 g today.  Foley catheter was changed by hospice at home last week per family.   #2 stage IV sacral decub seen by wound care,Cleanse sacral wound with Vashe, do not rinse. Using a Q tip applicator insert Vashe moistened kerlix roll gauze into wound bed making sure to cover area of depth, cover with dry gauze and ABD pad and tape or silicone foam whichever is preferred.  Cleanse R elbow wound with Vashe, do not rinse. Apply silver  hydrofiber Soila [phone] Aquacel AG) to wound bed daily and secure with silicone foam or Kerlix roll gauze whichever is preferred.  SOAK SILVER  WITH NORMAL SALINE IF ADHERED TO WOUND BED FOR ATRAUMATIC REMOVAL.   #3 malignancy of prostate currently on no treatments/BPH patient was on hospice until this admission.  Family does not want hospice and wants to be full code. Continue Flomax .   #4 history of essential hypertension on Norvasc  and losartan  prior to admission these were held due to soft BP on admission.   #5 hyperlipidemia on statin   #6 type 2 diabetes CBG (last 3)  Recent Labs (last 2 labs)  No results for input(s): GLUCAP in the last 72 hours.       #7 hyperkalemia potassium 5.3 Follow-up pending    Code Status: Full   Discharge Planning: ongoing  Family Contact:  Spoke with dtr Nanetta and wife [facility] via phone. Informed them that patient had been admitted for sepsis and would be receiving IV antibiotics.   IDT: updated  Goals of Care: Full Code   Medication list and Transfer Summary placed under Media Tab.  Should patient need ambulance transfer at discharge- please use [facility]) as they  contract this service for our active hospice patients.   Please call with any hospice related questions or concerns.   [name] Babe, BSN, RN [facility] Liaison [phone]

## 2024-11-17 NOTE — Plan of Care (Signed)

## 2024-11-18 LAB — URINE CULTURE: Culture: >=100000 — AB

## 2024-11-18 LAB — BASIC METABOLIC PANEL WITH GFR
Anion gap: 7 (ref 5–15)
BUN: 26 mg/dL — ABNORMAL HIGH (ref 8–23)
CO2: 24 mmol/L (ref 22–32)
Calcium: 8.1 mg/dL — ABNORMAL LOW (ref 8.9–10.3)
Chloride: 107 mmol/L (ref 98–111)
Creatinine, Ser: 0.87 mg/dL (ref 0.61–1.24)
GFR, Estimated: >60 mL/min (ref >60)
Glucose, Bld: 93 mg/dL (ref 70–99)
Potassium: 2.8 mmol/L — ABNORMAL LOW (ref 3.5–5.1)
Sodium: 138 mmol/L (ref 135–145)

## 2024-11-18 LAB — CBC
HCT: 23.9 % — ABNORMAL LOW (ref 39.0–52.0)
Hemoglobin: 7.6 g/dL — ABNORMAL LOW (ref 13.0–17.0)
MCH: 30.5 pg (ref 26.0–34.0)
MCHC: 31.8 g/dL (ref 30.0–36.0)
MCV: 96 fL (ref 80.0–100.0)
Platelets: 217 K/uL (ref 150–400)
RBC: 2.49 MIL/uL — ABNORMAL LOW (ref 4.22–5.81)
RDW: 15.9 % — ABNORMAL HIGH (ref 11.5–15.5)
WBC: 14.6 K/uL — ABNORMAL HIGH (ref 4.0–10.5)
nRBC: 0 % (ref 0.0–0.2)

## 2024-11-18 LAB — MAGNESIUM: Magnesium: 1.5 mg/dL — ABNORMAL LOW (ref 1.7–2.4)

## 2024-11-18 LAB — GLUCOSE, CAPILLARY
Glucose-Capillary: 84 mg/dL (ref 70–99)
Glucose-Capillary: 94 mg/dL (ref 70–99)
Glucose-Capillary: 94 mg/dL (ref 70–99)
Glucose-Capillary: 101 mg/dL — ABNORMAL HIGH (ref 70–99)

## 2024-11-18 MED ORDER — SODIUM CHLORIDE 0.9 % IV SOLN
1.0000 g | Freq: Three times a day (TID) | INTRAVENOUS | Status: DC
  Administered 2024-11-18 – 2024-11-21 (×9): 1 g via INTRAVENOUS
  Filled 2024-11-18 (×10): qty 20

## 2024-11-18 MED ORDER — VANCOMYCIN HCL IN DEXTROSE 1-5 GM/200ML-% IV SOLN
1000.0000 mg | INTRAVENOUS | Status: DC
  Administered 2024-11-18 – 2024-11-21 (×4): 1000 mg via INTRAVENOUS
  Filled 2024-11-18 (×4): qty 200

## 2024-11-18 MED ORDER — POTASSIUM CHLORIDE CRYS ER 20 MEQ PO TBCR
40.0000 meq | EXTENDED_RELEASE_TABLET | ORAL | Status: AC
  Administered 2024-11-18 (×2): 40 meq via ORAL
  Filled 2024-11-18 (×2): qty 2

## 2024-11-18 NOTE — Progress Notes (Signed)
 Pharmacy Antibiotic Note  Tanner Griffin is a 83 y.o. male admitted on 11/16/2024 with hypotension and concern for sepsis with multiple possible sources (chronic foley, decub ulcer.  Pharmacy has been consulted for vancomycin  dosing. Already on cefepime  and Flagyl  per MD  Day 3 antibiotics Afebrile WBC trending down SCr 0.87 down Cultures: - UCx: ESBL Ecoli + proteus mirabilis - BCx: staph epi + proteus species  Plan: Due to improved renal function, change vancomycin  from 1g IV q48 to 1g IV q24 - goal AUC 400-550 Measure vancomycin  AUC at steady state as indicated Due to ESBL Ecoli being in urine, will change rocephin  to meropenem  1g IV q8  Height: 5' 8 (172.7 cm) Weight: 62 kg (136 lb 11 oz) IBW/kg (Calculated) : 68.4  Temp (24hrs), Avg:98.2 F (36.8 C), Min:97.9 F (36.6 C), Max:98.8 F (37.1 C)  Recent Labs  Lab 11/16/24 1620 11/16/24 1629 11/16/24 1820 11/16/24 1826 11/16/24 1827 11/16/24 2155 11/17/24 1325 11/18/24 0540  WBC  --  22.9*  --   --   --   --  22.2* 14.6*  CREATININE  --   --  2.06*  --  2.20* 1.74* 1.15 0.87  LATICACIDVEN 2.3*  --   --  2.9*  --   --   --   --     Estimated Creatinine Clearance: 58.4 mL/min (by C-G formula based on SCr of 0.87 mg/dL).    Allergies  Allergen Reactions   Telmisartan Other (See Comments)    Reaction:  Headache      Thank you for allowing pharmacy to be a part of this patient's care.  Britta Eva Na 11/18/2024 9:14 AM

## 2024-11-18 NOTE — Plan of Care (Signed)
  Problem: Clinical Measurements: Goal: Respiratory complications will improve Outcome: Progressing Goal: Cardiovascular complication will be avoided Outcome: Progressing   Problem: Activity: Goal: Risk for activity intolerance will decrease Outcome: Progressing   Problem: Coping: Goal: Level of anxiety will decrease Outcome: Progressing   Problem: Elimination: Goal: Will not experience complications related to bowel motility Outcome: Progressing Goal: Will not experience complications related to urinary retention Outcome: Progressing   Problem: Safety: Goal: Ability to remain free from injury will improve Outcome: Progressing   Problem: Skin Integrity: Goal: Risk for impaired skin integrity will decrease Outcome: Progressing

## 2024-11-18 NOTE — TOC Initial Note (Signed)
 Transition of Care [facility]) - Initial/Assessment Note    Patient Details  Name: Tanner Griffin MRN: [id number] Date of Birth: September 05, 1941  Transition of [facility]) CM/SW Contact:    Heather DELENA Saltness, LCSW Phone Number: 11/18/2024, 2:49 PM  Clinical Narrative:                 Pt admitted to hospital due to hypotension. Pt alert and oriented x1 at baseline. Pt currently a full code. Pt is active with [facility] for home hospice services. TOC will continue to follow.   Expected Discharge Plan: Home w Hospice Care Barriers to Discharge: Continued Medical Work up   Patient Goals and CMS Choice Patient states their goals for this hospitalization and ongoing recovery are:: To return home with hospice        Expected Discharge Plan and Services In-house Referral: Clinical Social Work Discharge Planning Services: NA Post Acute Care Choice: Hospice Living arrangements for the past 2 months: Single Family Home                 DME Arranged: N/A DME Agency: NA       HH Arranged: NA HH Agency: NA        Prior Living Arrangements/Services Living arrangements for the past 2 months: Single Family Home Lives with:: Self, Spouse Patient language and need for interpreter reviewed:: Yes Do you feel safe going back to the place where you live?: Yes      Need for Family Participation in Patient Care: Yes (Comment) Care giver support system in place?: Yes (comment) Current home services: DME, Hospice Criminal Activity/Legal Involvement Pertinent to Current Situation/Hospitalization: No - Comment as needed  Activities of Daily Living   ADL Screening (condition at time of admission) Independently performs ADLs?: No Does the patient have a NEW difficulty with bathing/dressing/toileting/self-feeding that is expected to last >3 days?: No Does the patient have a NEW difficulty with getting in/out of bed, walking, or climbing stairs that is expected to last >3 days?: No Does the patient have  a NEW difficulty with communication that is expected to last >3 days?: No Is the patient deaf or have difficulty hearing?: No Does the patient have difficulty seeing, even when wearing glasses/contacts?: No Does the patient have difficulty concentrating, remembering, or making decisions?: Yes  Permission Sought/Granted Permission sought to share information with : Case Manager, Family Supports Permission granted to share information with : Yes, Verbal Permission Granted  Share Information with NAME: Abou Sterkel  Permission granted to share info w AGENCY: [facility]  Permission granted to share info w Relationship: Spouse  Permission granted to share info w Contact Information: [phone]  Emotional Assessment Appearance:: Appears stated age Attitude/Demeanor/Rapport: Unable to Assess Affect (typically observed): Unable to Assess Orientation: : Oriented to Self Alcohol / Substance Use: Not Applicable Psych Involvement: No (comment)  Admission diagnosis:  Dehydration [E86.0] AKI (acute kidney injury) [N17.9] Sepsis (HCC) [A41.9] Urinary tract infection associated with indwelling urethral catheter, initial encounter [U16.488J, N39.0] Pressure injury of sacral region, stage 4 (HCC) [L89.154] Sepsis with encephalopathy without septic shock, due to unspecified organism (HCC) [A41.9, R65.20, G93.41] Patient Active Problem List   Diagnosis Date Noted   Sepsis (HCC) 11/16/2024   Sacral decubitus ulcer, stage IV (HCC) 11/16/2024   Bilateral lower extremity edema 11/13/2023   Frequent falls 11/13/2023   Dizziness, nonspecific 11/13/2023   Mobitz type I Wenckebach atrioventricular block 11/13/2023   UTI (urinary tract infection) due to urinary indwelling catheter 08/09/2022   Skin  erosion 07/05/2022   Loose stools 07/05/2022   Poor social situation 05/20/2022   Flatulence/wind 02/18/2022   Impacted cerumen of left ear 12/01/2021   Bilateral impacted cerumen 10/28/2021   Degenerative  arthritis of right elbow 04/06/2021   Distal radius fracture, right 05/30/2020   Macrocytic anemia 03/31/2020   Paget's disease of the bone 12/10/2019   Acute left-sided low back pain without sciatica 11/29/2019   Malignant neoplasm of prostate (HCC) 02/12/2019   Lumbar spinal stenosis 09/25/2018   Right lumbar radiculopathy 09/05/2018   Polyneuropathy 09/05/2018   Right leg weakness 07/26/2018   Elevated PSA 03/16/2018   Right inguinal hernia 03/14/2017   Degenerative disc disease, lumbar 01/11/2017   Arthralgia 04/26/2016   Pain due to total knee replacement 04/26/2016   Chronic constipation 01/20/2015   Focal myoclonus 06/05/2014   Osteoarthritis of knee s/p bilateral knee replacements 03/26/2014   Spongiotic dermatitis 03/19/2013   History of colonic polyps 10/30/2012   BPH with obstruction/lower urinary tract symptoms 06/05/2012   Right knee pain 04/17/2011   Type II diabetes mellitus, well controlled (HCC) 02/12/2008   Hyperlipidemia 06/19/2007   Essential hypertension 06/19/2007   PCP:  Haze Kingfisher, MD Pharmacy:   [id number] [place], [place] - [address] AT [facility] OF [address] [place] [address] Phone: [phone] Fax: [phone]  [facility] - [place], [place] - [address] [place] [address] Phone: [phone] Fax: [phone]  [facility] - [place], [place] - [address] [place] [place] [address] Phone: [phone] Fax: [phone]   Social Drivers of Health (SDOH) Social History: SDOH Screenings   Food Insecurity: No Food Insecurity (11/16/2024)  Housing: Low Risk  (11/16/2024)  Transportation Needs: No Transportation Needs (11/16/2024)  Utilities: Not At Risk (11/16/2024)  Alcohol Screen: Low Risk  (10/16/2020)  Depression (PHQ2-9): Low Risk  (02/18/2022)  Financial Resource Strain: Low Risk   (10/16/2020)  Physical Activity: Inactive (04/30/2020)  Social Connections: Socially Isolated (11/16/2024)  Stress: No Stress Concern Present (10/16/2020)  Tobacco Use: Low Risk  (11/16/2024)   SDOH Interventions: None     Readmission Risk Interventions    11/18/2024    2:47 PM  Readmission Risk Prevention Plan  Transportation Screening Complete  PCP or Specialist Appt within 3-5 Days Complete  HRI or Home Care Consult Complete  Social Work Consult for Recovery Care Planning/Counseling Complete  Palliative Care Screening Complete  Medication Review Oceanographer) Complete   Signed: Heather Saltness, MSW, LCSW Clinical Social Worker Inpatient Care Management 11/18/2024 2:51 PM

## 2024-11-18 NOTE — Progress Notes (Signed)
 PROGRESS NOTE    CAINE Griffin  FMW:[id number] DOB: 04/27/1941 DOA: 11/16/2024 PCP: Haze Kingfisher, MD   Brief Narrative: This is a 83 year old male who lives at home with his family had a fall in June since then he has been bedridden and has not walked much has a history of prostate cancer hypertension has a chronic Foley.  Apparently the Foley catheter was changed by hospice last week per family.  Patient is not able to provide me any history.  He was admitted with concern for urinary tract infection and dehydration.  He was found to have an elevated lactate level started on Vanco cefepime  and Flagyl.  He also has a stage IV sacral decubitus.  He is seen by wound care.  Blood culture grew Staph epidermidis, Proteus, gram-negative Enterobacter, methicillin resistance detected, pending final.  Assessment & Plan:   Principal Problem:   Sepsis (HCC) Active Problems:   Type II diabetes mellitus, well controlled (HCC)   Hyperlipidemia   Essential hypertension   BPH with obstruction/lower urinary tract symptoms   Malignant neoplasm of prostate (HCC)   Sacral decubitus ulcer, stage IV (HCC)   #1 sepsis secondary to urinary tract infection present on admission.  Patient has history of prostate cancer chronic Foley in place and is bedridden.  At time of admission it was noted he had hypotension lactic acidosis and leukocytosis.  Initially started on Vanco Flagyl and cefepime .   Changed to Rocephin  2 g vanco continued Flagyl dc Urine culture growing more than 100,000 colonies of Proteus and E. coli ESBL Blood culture methicillin resistance detected, Proteus positive,  Foley catheter was changed by hospice at home last week per family.  #2 stage IV sacral decub seen by wound care,Cleanse sacral wound with Vashe, do not rinse. Using a Q tip applicator insert Vashe moistened kerlix roll gauze into wound bed making sure to cover area of depth, cover with dry gauze and ABD pad and tape or  silicone foam whichever is preferred.  Cleanse R elbow wound with Vashe, do not rinse. Apply silver  hydrofiber ([employer] [id number] Aquacel AG) to wound bed daily and secure with silicone foam or Kerlix roll gauze whichever is preferred.  SOAK SILVER  WITH NORMAL SALINE IF ADHERED TO WOUND BED FOR ATRAUMATIC REMOVAL.  #3 malignancy of prostate currently on no treatments/BPH patient was on hospice until this admission.  Family does not want hospice and wants to be full code. Continue Flomax .  #4 history of essential hypertension on Norvasc  and losartan  prior to admission these were held due to soft BP on admission.  #5 hyperlipidemia on statin  #6 type 2 diabetes CBG (last 3)  Recent Labs    11/17/24 2038 11/18/24 0734 11/18/24 1127  GLUCAP 75 84 94     #7  Hypokalemia potassium 2.8 mag levels pending replete and recheck in a.m.  Wound Pressure Injury Elbow Posterior;Right Stage 3 -  Full thickness tissue loss. Subcutaneous fat may be visible but bone, tendon or muscle are NOT exposed. (Active)    Estimated body mass index is 20.78 kg/m as calculated from the following:   Height as of this encounter: 5' 8 (1.727 m).   Weight as of this encounter: 62 kg.  DVT prophylaxis: Lovenox  Code Status: Full code Family Communication: Discussed with patient's daughter  disposition Plan:  Status is: Inpatient Remains inpatient appropriate because: Acute illness   Consultants:  None  Procedures: None rest Antimicrobials:-2 g Rocephin   Subjective: No overnight events reported  Objective: Vitals:  11/17/24 0821 11/17/24 1308 11/17/24 1921 11/18/24 0453  BP: 118/71 109/66 129/88 (!) 146/71  Pulse: 90 84 84 80  Resp: 18 20 15 15   Temp: 97.6 F (36.4 C) 98 F (36.7 C) 98.8 F (37.1 C) 97.9 F (36.6 C)  TempSrc:      SpO2: 100%  100%   Weight:      Height:        Intake/Output Summary (Last 24 hours) at 11/18/2024 1229 Last data filed at 11/18/2024 1219 Gross per 24 hour   Intake 2725 ml  Output 1025 ml  Net 1700 ml   Filed Weights   11/16/24 1515  Weight: 62 kg    Examination:  General exam: Appears chronically ill-appearing Respiratory system: Clear to auscultation. Respiratory effort normal. Cardiovascular system: Regular no tachycardia Gastrointestinal system: Abdomen is nondistended, soft and nontender. No organomegaly or masses felt. Normal bowel sounds heard. Central nervous system: Awake  extremities: No edema  Data Reviewed: I have personally reviewed following labs and imaging studies  CBC: Recent Labs  Lab 11/16/24 1629 11/16/24 1827 11/17/24 1325 11/18/24 0540  WBC 22.9*  --  22.2* 14.6*  NEUTROABS 20.9*  --   --   --   HGB 9.7* 11.2* 7.9* 7.6*  HCT 30.9* 33.0* 24.7* 23.9*  MCV 96.6  --  96.9 96.0  PLT 273  --  214 217   Basic Metabolic Panel: Recent Labs  Lab 11/16/24 1820 11/16/24 1827 11/16/24 2155 11/17/24 1325 11/18/24 0540  NA 138 139  --  136 138  K 4.7 5.3*  --  3.1* 2.8*  CL 108 109  --  107 107  CO2 19*  --   --  23 24  GLUCOSE 99 103*  --  107* 93  BUN 24* 33*  --  27* 26*  CREATININE 2.06* 2.20* 1.74* 1.15 0.87  CALCIUM  8.5*  --   --  8.2* 8.1*  MG  --   --   --  1.7  --    GFR: Estimated Creatinine Clearance: 58.4 mL/min (by C-G formula based on SCr of 0.87 mg/dL). Liver Function Tests: Recent Labs  Lab 11/16/24 1820 11/17/24 1325  AST 41 44*  ALT 9 12  ALKPHOS 88 75  BILITOT 1.3* 0.7  PROT 6.4* 5.7*  ALBUMIN 2.5* 2.2*   No results for input(s): LIPASE, AMYLASE in the last 168 hours. No results for input(s): AMMONIA in the last 168 hours. Coagulation Profile: Recent Labs  Lab 11/16/24 2155  INR 1.5*   Cardiac Enzymes: No results for input(s): CKTOTAL, CKMB, CKMBINDEX, TROPONINI in the last 168 hours. BNP (last 3 results) No results for input(s): PROBNP in the last 8760 hours. HbA1C: No results for input(s): HGBA1C in the last 72 hours. CBG: Recent Labs  Lab  11/17/24 1721 11/17/24 2038 11/18/24 0734 11/18/24 1127  GLUCAP 81 75 84 94   Lipid Profile: No results for input(s): CHOL, HDL, LDLCALC, TRIG, CHOLHDL, LDLDIRECT in the last 72 hours. Thyroid  Function Tests: No results for input(s): TSH, T4TOTAL, FREET4, T3FREE, THYROIDAB in the last 72 hours. Anemia Panel: No results for input(s): VITAMINB12, FOLATE, FERRITIN, TIBC, IRON, RETICCTPCT in the last 72 hours. Sepsis Labs: Recent Labs  Lab 11/16/24 1620 11/16/24 1826  LATICACIDVEN 2.3* 2.9*    Recent Results (from the past 240 hours)  Resp panel by RT-PCR (RSV, Flu A&B, Covid) Anterior Nasal Swab     Status: None   Collection Time: 11/16/24  4:10 PM   Specimen: Anterior  Nasal Swab  Result Value Ref Range Status   SARS Coronavirus 2 by RT PCR NEGATIVE NEGATIVE Final    Comment: (NOTE) SARS-CoV-2 target nucleic acids are NOT DETECTED.  The SARS-CoV-2 RNA is generally detectable in upper respiratory specimens during the acute phase of infection. The lowest concentration of SARS-CoV-2 viral copies this assay can detect is 138 copies/mL. A negative result does not preclude SARS-Cov-2 infection and should not be used as the sole basis for treatment or other patient management decisions. A negative result may occur with  improper specimen collection/handling, submission of specimen other than nasopharyngeal swab, presence of viral mutation(s) within the areas targeted by this assay, and inadequate number of viral copies(<138 copies/mL). A negative result must be combined with clinical observations, patient history, and epidemiological information. The expected result is Negative.  Fact Sheet for Patients:  bloggercourse.com  Fact Sheet for Healthcare Providers:  seriousbroker.it  This test is no t yet approved or cleared by the [place]  FDA and  has been authorized for detection and/or  diagnosis of SARS-CoV-2 by FDA under an Emergency Use Authorization (EUA). This EUA will remain  in effect (meaning this test can be used) for the duration of the COVID-19 declaration under Section 564(b)(1) of the Act, 21 U.S.C.section 360bbb-3(b)(1), unless the authorization is terminated  or revoked sooner.       Influenza A by PCR NEGATIVE NEGATIVE Final   Influenza B by PCR NEGATIVE NEGATIVE Final    Comment: (NOTE) The Xpert Xpress SARS-CoV-2/FLU/RSV plus assay is intended as an aid in the diagnosis of influenza from Nasopharyngeal swab specimens and should not be used as a sole basis for treatment. Nasal washings and aspirates are unacceptable for Xpert Xpress SARS-CoV-2/FLU/RSV testing.  Fact Sheet for Patients: bloggercourse.com  Fact Sheet for Healthcare Providers: seriousbroker.it  This test is not yet approved or cleared by the [place]  FDA and has been authorized for detection and/or diagnosis of SARS-CoV-2 by FDA under an Emergency Use Authorization (EUA). This EUA will remain in effect (meaning this test can be used) for the duration of the COVID-19 declaration under Section 564(b)(1) of the Act, 21 U.S.C. section 360bbb-3(b)(1), unless the authorization is terminated or revoked.     Resp Syncytial Virus by PCR NEGATIVE NEGATIVE Final    Comment: (NOTE) Fact Sheet for Patients: bloggercourse.com  Fact Sheet for Healthcare Providers: seriousbroker.it  This test is not yet approved or cleared by the [place]  FDA and has been authorized for detection and/or diagnosis of SARS-CoV-2 by FDA under an Emergency Use Authorization (EUA). This EUA will remain in effect (meaning this test can be used) for the duration of the COVID-19 declaration under Section 564(b)(1) of the Act, 21 U.S.C. section 360bbb-3(b)(1), unless the authorization is terminated  or revoked.  Performed at [facility], [address], [place], [place] [address]   Blood Culture (routine x 2)     Status: None (Preliminary result)   Collection Time: 11/16/24  4:11 PM   Specimen: BLOOD LEFT WRIST  Result Value Ref Range Status   Specimen Description   Final    BLOOD LEFT WRIST Performed at [facility], [address], [place], [place] [address]    Special Requests   Final    BOTTLES DRAWN AEROBIC AND ANAEROBIC Blood Culture results may not be optimal due to an inadequate volume of blood received in culture bottles Performed at [facility], [address], [place], [place] [address]  Culture  Setup Time   Final    GRAM NEGATIVE RODS ANAEROBIC BOTTLE ONLY CRITICAL RESULT CALLED TO, READ BACK BY AND VERIFIED WITH: PHARMD CHRISTINE SHADE [id number] AT 1120 BY EC GRAM POSITIVE COCCI AEROBIC BOTTLE ONLY CRITICAL VALUE NOTED.  VALUE IS CONSISTENT WITH PREVIOUSLY REPORTED AND CALLED VALUE.    Culture   Final    GRAM NEGATIVE RODS IDENTIFICATION TO FOLLOW CULTURE REINCUBATED FOR BETTER GROWTH Performed at [facility], [address], [place], [place] [address]    Report Status PENDING  Incomplete  Blood Culture ID Panel (Reflexed)     Status: Abnormal   Collection Time: 11/16/24  4:11 PM  Result Value Ref Range Status   Enterococcus faecalis NOT DETECTED NOT DETECTED Final   Enterococcus Faecium NOT DETECTED NOT DETECTED Final   Listeria monocytogenes NOT DETECTED NOT DETECTED Final   Staphylococcus species DETECTED (A) NOT DETECTED Final    Comment: CRITICAL RESULT CALLED TO, READ BACK BY AND VERIFIED WITH: PHARMD CHRISTINE SHADE [id number] AT 1120 BY EC    Staphylococcus aureus (BCID) NOT DETECTED NOT DETECTED Final   Staphylococcus epidermidis DETECTED (A) NOT DETECTED Final    Comment: Methicillin (oxacillin) resistant coagulase negative staphylococcus. Possible blood culture contaminant (unless isolated  from more than one blood culture draw or clinical case suggests pathogenicity). No antibiotic treatment is indicated for blood  culture contaminants. CRITICAL RESULT CALLED TO, READ BACK BY AND VERIFIED WITH: PHARMD CHRISTINE SHADE [id number] AT 1120 BY EC    Staphylococcus lugdunensis NOT DETECTED NOT DETECTED Final   Streptococcus species NOT DETECTED NOT DETECTED Final   Streptococcus agalactiae NOT DETECTED NOT DETECTED Final   Streptococcus pneumoniae NOT DETECTED NOT DETECTED Final   Streptococcus pyogenes NOT DETECTED NOT DETECTED Final   A.calcoaceticus-baumannii NOT DETECTED NOT DETECTED Final   Bacteroides fragilis NOT DETECTED NOT DETECTED Final   Enterobacterales DETECTED (A) NOT DETECTED Final    Comment: Enterobacterales represent a large order of gram negative bacteria, not a single organism. CRITICAL RESULT CALLED TO, READ BACK BY AND VERIFIED WITH: PHARMD CHRISTINE SHADE [id number] AT 1120 BY EC    Enterobacter cloacae complex NOT DETECTED NOT DETECTED Final   Escherichia coli NOT DETECTED NOT DETECTED Final   Klebsiella aerogenes NOT DETECTED NOT DETECTED Final   Klebsiella oxytoca NOT DETECTED NOT DETECTED Final   Klebsiella pneumoniae NOT DETECTED NOT DETECTED Final   Proteus species DETECTED (A) NOT DETECTED Final    Comment: CRITICAL RESULT CALLED TO, READ BACK BY AND VERIFIED WITH: PHARMD CHRISTINE SHADE [id number] AT 1120 BY EC    Salmonella species NOT DETECTED NOT DETECTED Final   Serratia marcescens NOT DETECTED NOT DETECTED Final   Haemophilus influenzae NOT DETECTED NOT DETECTED Final   Neisseria meningitidis NOT DETECTED NOT DETECTED Final   Pseudomonas aeruginosa NOT DETECTED NOT DETECTED Final   Stenotrophomonas maltophilia NOT DETECTED NOT DETECTED Final   Candida albicans NOT DETECTED NOT DETECTED Final   Candida auris NOT DETECTED NOT DETECTED Final   Candida glabrata NOT DETECTED NOT DETECTED Final   Candida krusei NOT DETECTED NOT DETECTED Final    Candida parapsilosis NOT DETECTED NOT DETECTED Final   Candida tropicalis NOT DETECTED NOT DETECTED Final   Cryptococcus neoformans/gattii NOT DETECTED NOT DETECTED Final   CTX-M ESBL NOT DETECTED NOT DETECTED Final   Carbapenem resistance IMP NOT DETECTED NOT DETECTED Final   Carbapenem resistance KPC NOT DETECTED NOT DETECTED Final   Methicillin resistance mecA/C DETECTED (A) NOT DETECTED Final  Comment: CRITICAL RESULT CALLED TO, READ BACK BY AND VERIFIED WITH: PHARMD CHRISTINE SHADE [id number] AT 1120 BY EC    Carbapenem resistance NDM NOT DETECTED NOT DETECTED Final   Carbapenem resist OXA 48 LIKE NOT DETECTED NOT DETECTED Final   Carbapenem resistance VIM NOT DETECTED NOT DETECTED Final    Comment: Performed at [facility], [address] [place], [place] [address]  Blood Culture (routine x 2)     Status: None (Preliminary result)   Collection Time: 11/16/24  4:16 PM   Specimen: BLOOD RIGHT WRIST  Result Value Ref Range Status   Specimen Description   Final    BLOOD RIGHT WRIST Performed at [facility], [address] [place], [place] [address]    Special Requests   Final    BOTTLES DRAWN AEROBIC AND ANAEROBIC Blood Culture results may not be optimal due to an inadequate volume of blood received in culture bottles Performed at [facility], [address], [place], [place] [address]    Culture  Setup Time   Final    GRAM POSITIVE COCCI AEROBIC BOTTLE ONLY CRITICAL RESULT CALLED TO, READ BACK BY AND VERIFIED WITH: PHARMD CRYSTAL ROBERTSON [id number] AT 1402 BY EC GRAM NEGATIVE RODS ANAEROBIC BOTTLE ONLY CRITICAL VALUE NOTED.  VALUE IS CONSISTENT WITH PREVIOUSLY REPORTED AND CALLED VALUE.    Culture   Final    GRAM POSITIVE COCCI IDENTIFICATION TO FOLLOW CULTURE REINCUBATED FOR BETTER GROWTH Performed at [facility], [address], [place], [place] [address]    Report Status PENDING  Incomplete  Urine Culture     Status: Abnormal    Collection Time: 11/16/24  4:39 PM   Specimen: Urine, Random  Result Value Ref Range Status   Specimen Description   Final    URINE, RANDOM Performed at [facility], [address] [place], [place] [address]    Special Requests   Final    NONE Reflexed from [id number] Performed at [facility], [address] [place], [place] [address]    Culture (A)  Final    >=100,000 COLONIES/mL PROTEUS MIRABILIS >=100,000 COLONIES/mL ESCHERICHIA COLI Confirmed Extended Spectrum Beta-Lactamase Producer (ESBL).  In bloodstream infections from ESBL organisms, carbapenems are preferred over piperacillin/tazobactam. They are shown to have a lower risk of mortality.    Report Status 11/18/2024 FINAL  Final   Organism ID, Bacteria PROTEUS MIRABILIS (A)  Final   Organism ID, Bacteria ESCHERICHIA COLI (A)  Final      Susceptibility   Escherichia coli - MIC*    AMPICILLIN >=32 RESISTANT Resistant     CEFAZOLIN  (URINE) Value in next row Resistant      >=32 RESISTANTThis is a modified FDA-approved test that has been validated and its performance characteristics determined by the reporting laboratory.  This laboratory is certified under the Clinical Laboratory Improvement Amendments CLIA as qualified to perform high complexity clinical laboratory testing.    CEFEPIME  Value in next row Intermediate      >=32 RESISTANTThis is a modified FDA-approved test that has been validated and its performance characteristics determined by the reporting laboratory.  This laboratory is certified under the Clinical Laboratory Improvement Amendments CLIA as qualified to perform high complexity clinical laboratory testing.    ERTAPENEM Value in next row Sensitive      >=32 RESISTANTThis is a modified FDA-approved test that has been validated and its performance characteristics determined by the reporting laboratory.  This laboratory is  certified under the Clinical Laboratory Improvement Amendments  CLIA as qualified to perform high complexity clinical laboratory testing.    CEFTRIAXONE  Value in next row Resistant      >=32 RESISTANTThis is a modified FDA-approved test that has been validated and its performance characteristics determined by the reporting laboratory.  This laboratory is certified under the Clinical Laboratory Improvement Amendments CLIA as qualified to perform high complexity clinical laboratory testing.    CIPROFLOXACIN  Value in next row Resistant      >=32 RESISTANTThis is a modified FDA-approved test that has been validated and its performance characteristics determined by the reporting laboratory.  This laboratory is certified under the Clinical Laboratory Improvement Amendments CLIA as qualified to perform high complexity clinical laboratory testing.    GENTAMICIN Value in next row Sensitive      >=32 RESISTANTThis is a modified FDA-approved test that has been validated and its performance characteristics determined by the reporting laboratory.  This laboratory is certified under the Clinical Laboratory Improvement Amendments CLIA as qualified to perform high complexity clinical laboratory testing.    NITROFURANTOIN Value in next row Sensitive      >=32 RESISTANTThis is a modified FDA-approved test that has been validated and its performance characteristics determined by the reporting laboratory.  This laboratory is certified under the Clinical Laboratory Improvement Amendments CLIA as qualified to perform high complexity clinical laboratory testing.    TRIMETH /SULFA  Value in next row Resistant      >=32 RESISTANTThis is a modified FDA-approved test that has been validated and its performance characteristics determined by the reporting laboratory.  This laboratory is certified under the Clinical Laboratory Improvement Amendments CLIA as qualified to perform high complexity clinical laboratory testing.    AMPICILLIN/SULBACTAM Value in next row Resistant      >=32 RESISTANTThis  is a modified FDA-approved test that has been validated and its performance characteristics determined by the reporting laboratory.  This laboratory is certified under the Clinical Laboratory Improvement Amendments CLIA as qualified to perform high complexity clinical laboratory testing.    PIP/TAZO Value in next row Sensitive      <=4 SENSITIVEThis is a modified FDA-approved test that has been validated and its performance characteristics determined by the reporting laboratory.  This laboratory is certified under the Clinical Laboratory Improvement Amendments CLIA as qualified to perform high complexity clinical laboratory testing.    MEROPENEM Value in next row Sensitive      <=4 SENSITIVEThis is a modified FDA-approved test that has been validated and its performance characteristics determined by the reporting laboratory.  This laboratory is certified under the Clinical Laboratory Improvement Amendments CLIA as qualified to perform high complexity clinical laboratory testing.    * >=100,000 COLONIES/mL ESCHERICHIA COLI   Proteus mirabilis - MIC*    AMPICILLIN Value in next row Sensitive      <=4 SENSITIVEThis is a modified FDA-approved test that has been validated and its performance characteristics determined by the reporting laboratory.  This laboratory is certified under the Clinical Laboratory Improvement Amendments CLIA as qualified to perform high complexity clinical laboratory testing.    CEFAZOLIN  (URINE) Value in next row Sensitive      4 SENSITIVEThis is a modified FDA-approved test that has been validated and its performance characteristics determined by the reporting laboratory.  This laboratory is certified under the Clinical Laboratory Improvement Amendments CLIA as qualified to perform high complexity clinical laboratory testing.    CEFEPIME  Value in next row Sensitive      4 SENSITIVEThis  is a modified FDA-approved test that has been validated and its performance characteristics  determined by the reporting laboratory.  This laboratory is certified under the Clinical Laboratory Improvement Amendments CLIA as qualified to perform high complexity clinical laboratory testing.    ERTAPENEM Value in next row Sensitive      4 SENSITIVEThis is a modified FDA-approved test that has been validated and its performance characteristics determined by the reporting laboratory.  This laboratory is certified under the Clinical Laboratory Improvement Amendments CLIA as qualified to perform high complexity clinical laboratory testing.    CEFTRIAXONE  Value in next row Sensitive      4 SENSITIVEThis is a modified FDA-approved test that has been validated and its performance characteristics determined by the reporting laboratory.  This laboratory is certified under the Clinical Laboratory Improvement Amendments CLIA as qualified to perform high complexity clinical laboratory testing.    CIPROFLOXACIN  Value in next row Sensitive      4 SENSITIVEThis is a modified FDA-approved test that has been validated and its performance characteristics determined by the reporting laboratory.  This laboratory is certified under the Clinical Laboratory Improvement Amendments CLIA as qualified to perform high complexity clinical laboratory testing.    GENTAMICIN Value in next row Sensitive      4 SENSITIVEThis is a modified FDA-approved test that has been validated and its performance characteristics determined by the reporting laboratory.  This laboratory is certified under the Clinical Laboratory Improvement Amendments CLIA as qualified to perform high complexity clinical laboratory testing.    NITROFURANTOIN Value in next row Resistant      4 SENSITIVEThis is a modified FDA-approved test that has been validated and its performance characteristics determined by the reporting laboratory.  This laboratory is certified under the Clinical Laboratory Improvement Amendments CLIA as qualified to perform high complexity  clinical laboratory testing.    TRIMETH /SULFA  Value in next row Sensitive      4 SENSITIVEThis is a modified FDA-approved test that has been validated and its performance characteristics determined by the reporting laboratory.  This laboratory is certified under the Clinical Laboratory Improvement Amendments CLIA as qualified to perform high complexity clinical laboratory testing.    AMPICILLIN/SULBACTAM Value in next row Sensitive      4 SENSITIVEThis is a modified FDA-approved test that has been validated and its performance characteristics determined by the reporting laboratory.  This laboratory is certified under the Clinical Laboratory Improvement Amendments CLIA as qualified to perform high complexity clinical laboratory testing.    PIP/TAZO Value in next row Sensitive      <=4 SENSITIVEThis is a modified FDA-approved test that has been validated and its performance characteristics determined by the reporting laboratory.  This laboratory is certified under the Clinical Laboratory Improvement Amendments CLIA as qualified to perform high complexity clinical laboratory testing.    MEROPENEM Value in next row Sensitive      <=4 SENSITIVEThis is a modified FDA-approved test that has been validated and its performance characteristics determined by the reporting laboratory.  This laboratory is certified under the Clinical Laboratory Improvement Amendments CLIA as qualified to perform high complexity clinical laboratory testing.    * >=100,000 COLONIES/mL PROTEUS MIRABILIS     Radiology Studies: DG Chest Port 1 View Result Date: 11/16/2024 CLINICAL DATA:  Patient normal yesterday, today not speaking. Groaning with apparent pain. EXAM: PORTABLE CHEST 1 VIEW COMPARISON:  08/09/2022. FINDINGS: Cardiac silhouette is normal size.  No mediastinal or hilar masses. Clear lungs.  No pleural effusion or pneumothorax. Skeletal  structures grossly intact. IMPRESSION: No active disease. Electronically Signed   By:  Alm Parkins M.D.   On: 11/16/2024 16:15    Scheduled Meds:  aspirin  EC  81 mg Oral Daily   Chlorhexidine  Gluconate Cloth  6 each Topical Daily   heparin   5,000 Units Subcutaneous Q8H   insulin  aspart  0-6 Units Subcutaneous TID WC   tamsulosin   0.4 mg Oral Daily   Continuous Infusions:  meropenem  (MERREM ) IV     vancomycin  1,000 mg (11/18/24 1134)     LOS: 2 days   Almarie KANDICE Hoots, MD 11/18/2024, 12:29 PM

## 2024-11-18 NOTE — Progress Notes (Signed)
 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 Family alerted [facility] that patient  was not responding. Home visit completed by nurse. Patient was unresponsive and hypotensive. Family requested transfer to ED for evaluation. Patient was transported to ED from home. Patient is admitted for sepsis. Per Dr. Odella Pepper with [facility] this is a related hospital admission. Patient is a Full Code.   Patient seen at bedside. He was awake and watching television. Wife also in room with patient. Patient denied any discomfort. Continues to receive IV antibiotics. Urine and Blood Cultures back. Urine positive for ESBL Ecoli + proteus mirabilis, Blood positive for staph epi + proteus species. Per nurse, air mattress bed should be swapped out by tomorrow at the latest.     Per chart review, patient remains GIP appropriate as he requires IV antibiotics.    Vital Signs: 97.9/80/15    146/71  100% 2L [place]   I/O: 2525/550   Abnormal Labs:  11/17/24 13:25 Comprehensive metabolic panel with GFR: Rpt ! Potassium: 3.1 (L) Glucose: 107 (H) BUN: 27 (H) Calcium : 8.2 (L) Albumin: 2.2 (L) AST: 44 (H) Total Protein: 5.7 (L) WBC: 22.2 (H) RBC: 2.55 (L) Hemoglobin: 7.9 (L) HCT: 24.7 (L) RDW: 16.1 (H)  11/18/24 05:40 Basic metabolic panel with GFR: Rpt ! Potassium: 2.8 (L) BUN: 26 (H) Calcium : 8.1 (L) WBC: 14.6 (H) RBC: 2.49 (L) Hemoglobin: 7.6 (L) HCT: 23.9 (L) RDW: 15.9 (H)    Diagnostics:  11/16/24 16:39 Urinalysis, w/ Reflex to Culture (Infection Suspected): Rpt ! Appearance: TURBID ! Hgb urine dipstick: SMALL ! Leukocytes,Ua: SMALL ! Protein: >=300 ! Bacteria, UA: MANY ! Mucus: PRESENT RBC / HPF: >50 WBC, UA: >50 Organism ID, Bacteria: PROTEUS MIRABILIS !    ESCHERICHIA COLI !     IV/PRN Meds:  Rocephin  2g IV x1, Vancomycin 1000mg   IV x1,      Assessment and Plan per [name], MD  11.30.25 Assessment & Plan:   Principal Problem:   Sepsis [facility]) Active Problems:   Type II diabetes mellitus, well controlled (HCC)   Hyperlipidemia   Essential hypertension   BPH with obstruction/lower urinary tract symptoms   Malignant neoplasm of prostate (HCC)   Sacral decubitus ulcer, stage IV (HCC)     #1 sepsis secondary to urinary tract infection present on admission.  Patient has history of prostate cancer chronic Foley in place and is bedridden.  At time of admission it was noted he had hypotension lactic acidosis and leukocytosis.  Initially started on Vanco Flagyl and cefepime .   Changed to Rocephin  2 g vanco continued Flagyl dc Urine culture growing more than 100,000 colonies of Proteus and E. coli ESBL Blood culture methicillin resistance detected, Proteus positive,   Foley catheter was changed by hospice at home last week per family.   #2 stage IV sacral decub seen by wound care,Cleanse sacral wound with Vashe, do not rinse. Using a Q tip applicator insert Vashe moistened kerlix roll gauze into wound bed making sure to cover area of depth, cover with dry gauze and ABD pad and tape or silicone foam whichever is preferred.  Cleanse R elbow wound with Vashe, do not rinse. Apply silver  hydrofiber ([place] [phone] Aquacel [employer]) to wound bed daily and secure with silicone foam or Kerlix roll gauze whichever is preferred.  SOAK SILVER  WITH NORMAL SALINE IF ADHERED TO WOUND BED FOR ATRAUMATIC  REMOVAL.   #3 malignancy of prostate currently on no treatments/BPH patient was on hospice until this admission.  Family does not want hospice and wants to be full code. Continue Flomax .   #4 history of essential hypertension on Norvasc  and losartan  prior to admission these were held due to soft BP on admission.   #5 hyperlipidemia on statin   #6 type 2 diabetes CBG (last 3)  Recent Labs (last 2 labs)       Recent Labs     11/17/24 2038 11/18/24 0734 11/18/24 1127  GLUCAP 75 84 94          #7  Hypokalemia potassium 2.8 mag levels pending replete and recheck in a.m.  Wound Pressure Injury Elbow Posterior;Right Stage 3 -  Full thickness tissue loss. Subcutaneous fat may be visible but bone, tendon or muscle are NOT exposed. (Active)       Code Status: Full   Discharge Planning: ongoing   Family Contact:  Spoke with wife at bedside.    IDT: updated   Goals of Care: Full Code    Should patient need ambulance transfer at discharge- please use [facility]) as they contract this service for our active hospice patients.    Please call with any hospice related questions or concerns.   [name] Babe, BSN, RN [facility] Liaison [phone]

## 2024-11-19 DIAGNOSIS — N179 Acute kidney failure, unspecified: Secondary | ICD-10-CM

## 2024-11-19 LAB — BASIC METABOLIC PANEL WITH GFR
Anion gap: 6 (ref 5–15)
BUN: 18 mg/dL (ref 8–23)
CO2: 24 mmol/L (ref 22–32)
Calcium: 8.4 mg/dL — ABNORMAL LOW (ref 8.9–10.3)
Chloride: 111 mmol/L (ref 98–111)
Creatinine, Ser: 0.72 mg/dL (ref 0.61–1.24)
GFR, Estimated: >60 mL/min (ref >60)
Glucose, Bld: 80 mg/dL (ref 70–99)
Potassium: 4 mmol/L (ref 3.5–5.1)
Sodium: 141 mmol/L (ref 135–145)

## 2024-11-19 LAB — CBC
HCT: 24.5 % — ABNORMAL LOW (ref 39.0–52.0)
Hemoglobin: 7.7 g/dL — ABNORMAL LOW (ref 13.0–17.0)
MCH: 30.2 pg (ref 26.0–34.0)
MCHC: 31.4 g/dL (ref 30.0–36.0)
MCV: 96.1 fL (ref 80.0–100.0)
Platelets: 222 K/uL (ref 150–400)
RBC: 2.55 MIL/uL — ABNORMAL LOW (ref 4.22–5.81)
RDW: 16 % — ABNORMAL HIGH (ref 11.5–15.5)
WBC: 13 K/uL — ABNORMAL HIGH (ref 4.0–10.5)
nRBC: 0 % (ref 0.0–0.2)

## 2024-11-19 LAB — GLUCOSE, CAPILLARY
Glucose-Capillary: 83 mg/dL (ref 70–99)
Glucose-Capillary: 88 mg/dL (ref 70–99)
Glucose-Capillary: 83 mg/dL (ref 70–99)
Glucose-Capillary: 108 mg/dL — ABNORMAL HIGH (ref 70–99)

## 2024-11-19 NOTE — Plan of Care (Signed)

## 2024-11-19 NOTE — Progress Notes (Signed)
 PROGRESS NOTE    DAOUD LOBUE  FMW:[id number] DOB: Jan 02, 1941 DOA: 11/16/2024 PCP: Haze Kingfisher, MD   Brief Narrative: This is a 83 year old male who lives at home with his family had a fall in June since then he has been bedridden and has not walked much has a history of prostate cancer hypertension has a chronic Foley.  Apparently the Foley catheter was changed by hospice last week per family.  Patient is not able to provide me any history.  He was admitted with concern for urinary tract infection and dehydration.  He was found to have an elevated lactate level started on Vanco cefepime  and Flagyl.  He also has a stage IV sacral decubitus.  He is seen by wound care.  Blood culture grew Staph epidermidis, Proteus, gram-negative Enterobacter, methicillin resistance detected  Assessment & Plan:   Principal Problem:   Sepsis (HCC) Active Problems:   Type II diabetes mellitus, well controlled (HCC)   Hyperlipidemia   Essential hypertension   BPH with obstruction/lower urinary tract symptoms   Malignant neoplasm of prostate (HCC)   Sacral decubitus ulcer, stage IV (HCC)   #1 sepsis secondary to urinary tract infection present on admission.  Patient has history of prostate cancer chronic Foley in place and is bedridden.  At time of admission it was noted he had hypotension lactic acidosis and leukocytosis.  Initially started on Vanco Flagyl and cefepime .  Changed to vancomycin and meropenem once cultures came back.  Urine culture growing more than 100,000 colonies of Proteus and E. coli ESBL Blood culture Staph epidermidis methicillin resistance detected, Proteus positive, Foley catheter was changed by hospice at home last week per family.  #2 stage IV sacral decub seen by wound care,Cleanse sacral wound with Vashe, do not rinse. Using a Q tip applicator insert Vashe moistened kerlix roll gauze into wound bed making sure to cover area of depth, cover with dry gauze and ABD pad and tape  or silicone foam whichever is preferred.  Cleanse R elbow wound with Vashe, do not rinse. Apply silver  hydrofiber ([employer] [phone] Aquacel AG) to wound bed daily and secure with silicone foam or Kerlix roll gauze whichever is preferred.  SOAK SILVER  WITH NORMAL SALINE IF ADHERED TO WOUND BED FOR ATRAUMATIC REMOVAL.  #3 malignancy of prostate currently on no treatments/BPH patient was on hospice until this admission.  Family does not want hospice and wants to be full code. Continue Flomax .  #4 history of essential hypertension on Norvasc  and losartan  prior to admission these were held due to soft BP on admission.  #5 hyperlipidemia on statin  #6 type 2 diabetes CBG (last 3)  Recent Labs    11/18/24 2138 11/19/24 0730 11/19/24 1122  GLUCAP 101* 83 88     #7  Hypokalemia potassium 2.8 mag levels pending replete and recheck in a.m.    Wound Pressure Injury Elbow Posterior;Right Stage 3 -  Full thickness tissue loss. Subcutaneous fat may be visible but bone, tendon or muscle are NOT exposed. (Active)      Estimated body mass index is 20.78 kg/m as calculated from the following:   Height as of this encounter: 5' 8 (1.727 m).   Weight as of this encounter: 62 kg.  DVT prophylaxis: Lovenox  Code Status: Full code Family Communication: Discussed with patient's daughter  disposition Plan:  Status is: Inpatient Remains inpatient appropriate because: Acute illness   Consultants:  None  Procedures: None Antimicrobials: Anti-infectives (From admission, onward)    Start  Dose/Rate Route Frequency Ordered Stop   11/18/24 1600  vancomycin  (VANCOCIN ) IVPB 1000 mg/200 mL premix  Status:  Discontinued        1,000 mg 200 mL/hr over 60 Minutes Intravenous Every 48 hours 11/17/24 1407 11/18/24 0922   11/18/24 1400  meropenem  (MERREM ) 1 g in sodium chloride  0.9 % 100 mL IVPB        1 g 200 mL/hr over 30 Minutes Intravenous Every 8 hours 11/18/24 0922     11/18/24 1200  vancomycin   (VANCOCIN ) IVPB 1000 mg/200 mL premix        1,000 mg 200 mL/hr over 60 Minutes Intravenous Every 24 hours 11/18/24 0922     11/17/24 1600  vancomycin  (VANCOCIN ) IVPB 1000 mg/200 mL premix  Status:  Discontinued        1,000 mg 200 mL/hr over 60 Minutes Intravenous Every 48 hours 11/16/24 2130 11/17/24 1227   11/17/24 1600  vancomycin  (VANCOCIN ) IVPB 1000 mg/200 mL premix  Status:  Discontinued        1,000 mg 200 mL/hr over 60 Minutes Intravenous Every 48 hours 11/17/24 1403 11/17/24 1407   11/17/24 1400  ceFEPIme  (MAXIPIME ) 2 g in sodium chloride  0.9 % 100 mL IVPB  Status:  Discontinued        2 g 200 mL/hr over 30 Minutes Intravenous Daily 11/16/24 2052 11/17/24 1227   11/17/24 1315  cefTRIAXone  (ROCEPHIN ) 2 g in sodium chloride  0.9 % 100 mL IVPB  Status:  Discontinued        2 g 200 mL/hr over 30 Minutes Intravenous Every 24 hours 11/17/24 1227 11/18/24 0922   11/17/24 0600  metroNIDAZOLE  (FLAGYL ) IVPB 500 mg  Status:  Discontinued        500 mg 100 mL/hr over 60 Minutes Intravenous 2 times daily 11/16/24 2052 11/17/24 1227   11/16/24 2145  vancomycin  (VANCOCIN ) IVPB 1000 mg/200 mL premix  Status:  Discontinued        1,000 mg 200 mL/hr over 60 Minutes Intravenous Every 24 hours 11/16/24 2052 11/16/24 2130   11/16/24 1530  ceFEPIme  (MAXIPIME ) 2 g in sodium chloride  0.9 % 100 mL IVPB        2 g 200 mL/hr over 30 Minutes Intravenous  Once 11/16/24 1523 11/16/24 1721   11/16/24 1530  metroNIDAZOLE  (FLAGYL ) IVPB 500 mg        500 mg 100 mL/hr over 60 Minutes Intravenous  Once 11/16/24 1523 11/16/24 1735   11/16/24 1530  vancomycin  (VANCOCIN ) IVPB 1000 mg/200 mL premix        1,000 mg 200 mL/hr over 60 Minutes Intravenous  Once 11/16/24 1523 11/16/24 1822        Subjective: Asking for food  Objective: Vitals:   11/18/24 1306 11/18/24 1945 11/19/24 0507 11/19/24 1127  BP: 124/74 125/71 137/78 (!) 148/79  Pulse: 72 82 77 69  Resp:  18 18 18   Temp: 99.1 F (37.3 C) 99.3 F  (37.4 C) (!) 97.4 F (36.3 C) 98.7 F (37.1 C)  TempSrc: Oral Oral Oral   SpO2: 97% 100% 100% 100%  Weight:      Height:        Intake/Output Summary (Last 24 hours) at 11/19/2024 1436 Last data filed at 11/19/2024 0915 Gross per 24 hour  Intake 510 ml  Output 750 ml  Net -240 ml   Filed Weights   11/16/24 1515  Weight: 62 kg    Examination:  General exam: Appears chronically ill-appearing Respiratory system: Clear to auscultation.  Respiratory effort normal. Cardiovascular system: Regular no tachycardia Gastrointestinal system: Abdomen is nondistended, soft and nontender. No organomegaly or masses felt. Normal bowel sounds heard. Central nervous system: Awake  extremities: No edema  Data Reviewed: I have personally reviewed following labs and imaging studies  CBC: Recent Labs  Lab 11/16/24 1629 11/16/24 1827 11/17/24 1325 11/18/24 0540 11/19/24 0450  WBC 22.9*  --  22.2* 14.6* 13.0*  NEUTROABS 20.9*  --   --   --   --   HGB 9.7* 11.2* 7.9* 7.6* 7.7*  HCT 30.9* 33.0* 24.7* 23.9* 24.5*  MCV 96.6  --  96.9 96.0 96.1  PLT 273  --  214 217 222   Basic Metabolic Panel: Recent Labs  Lab 11/16/24 1820 11/16/24 1827 11/16/24 2155 11/17/24 1325 11/18/24 0540 11/19/24 0450  NA 138 139  --  136 138 141  K 4.7 5.3*  --  3.1* 2.8* 4.0  CL 108 109  --  107 107 111  CO2 19*  --   --  23 24 24   GLUCOSE 99 103*  --  107* 93 80  BUN 24* 33*  --  27* 26* 18  CREATININE 2.06* 2.20* 1.74* 1.15 0.87 0.72  CALCIUM  8.5*  --   --  8.2* 8.1* 8.4*  MG  --   --   --  1.7 1.5*  --    GFR: Estimated Creatinine Clearance: 63.5 mL/min (by C-G formula based on SCr of 0.72 mg/dL). Liver Function Tests: Recent Labs  Lab 11/16/24 1820 11/17/24 1325  AST 41 44*  ALT 9 12  ALKPHOS 88 75  BILITOT 1.3* 0.7  PROT 6.4* 5.7*  ALBUMIN 2.5* 2.2*   No results for input(s): LIPASE, AMYLASE in the last 168 hours. No results for input(s): AMMONIA in the last 168  hours. Coagulation Profile: Recent Labs  Lab 11/16/24 2155  INR 1.5*   Cardiac Enzymes: No results for input(s): CKTOTAL, CKMB, CKMBINDEX, TROPONINI in the last 168 hours. BNP (last 3 results) No results for input(s): PROBNP in the last 8760 hours. HbA1C: No results for input(s): HGBA1C in the last 72 hours. CBG: Recent Labs  Lab 11/18/24 1127 11/18/24 1700 11/18/24 2138 11/19/24 0730 11/19/24 1122  GLUCAP 94 94 101* 83 88   Lipid Profile: No results for input(s): CHOL, HDL, LDLCALC, TRIG, CHOLHDL, LDLDIRECT in the last 72 hours. Thyroid  Function Tests: No results for input(s): TSH, T4TOTAL, FREET4, T3FREE, THYROIDAB in the last 72 hours. Anemia Panel: No results for input(s): VITAMINB12, FOLATE, FERRITIN, TIBC, IRON, RETICCTPCT in the last 72 hours. Sepsis Labs: Recent Labs  Lab 11/16/24 1620 11/16/24 1826  LATICACIDVEN 2.3* 2.9*    Recent Results (from the past 240 hours)  Resp panel by RT-PCR (RSV, Flu A&B, Covid) Anterior Nasal Swab     Status: None   Collection Time: 11/16/24  4:10 PM   Specimen: Anterior Nasal Swab  Result Value Ref Range Status   SARS Coronavirus 2 by RT PCR NEGATIVE NEGATIVE Final    Comment: (NOTE) SARS-CoV-2 target nucleic acids are NOT DETECTED.  The SARS-CoV-2 RNA is generally detectable in upper respiratory specimens during the acute phase of infection. The lowest concentration of SARS-CoV-2 viral copies this assay can detect is 138 copies/mL. A negative result does not preclude SARS-Cov-2 infection and should not be used as the sole basis for treatment or other patient management decisions. A negative result may occur with  improper specimen collection/handling, submission of specimen other than nasopharyngeal swab, presence of viral  mutation(s) within the areas targeted by this assay, and inadequate number of viral copies(<138 copies/mL). A negative result must be combined  with clinical observations, patient history, and epidemiological information. The expected result is Negative.  Fact Sheet for Patients:  bloggercourse.com  Fact Sheet for Healthcare Providers:  seriousbroker.it  This test is no t yet approved or cleared by the [place]  FDA and  has been authorized for detection and/or diagnosis of SARS-CoV-2 by FDA under an Emergency Use Authorization (EUA). This EUA will remain  in effect (meaning this test can be used) for the duration of the COVID-19 declaration under Section 564(b)(1) of the Act, 21 U.S.C.section 360bbb-3(b)(1), unless the authorization is terminated  or revoked sooner.       Influenza A by PCR NEGATIVE NEGATIVE Final   Influenza B by PCR NEGATIVE NEGATIVE Final    Comment: (NOTE) The Xpert Xpress SARS-CoV-2/FLU/RSV plus assay is intended as an aid in the diagnosis of influenza from Nasopharyngeal swab specimens and should not be used as a sole basis for treatment. Nasal washings and aspirates are unacceptable for Xpert Xpress SARS-CoV-2/FLU/RSV testing.  Fact Sheet for Patients: bloggercourse.com  Fact Sheet for Healthcare Providers: seriousbroker.it  This test is not yet approved or cleared by the [place]  FDA and has been authorized for detection and/or diagnosis of SARS-CoV-2 by FDA under an Emergency Use Authorization (EUA). This EUA will remain in effect (meaning this test can be used) for the duration of the COVID-19 declaration under Section 564(b)(1) of the Act, 21 U.S.C. section 360bbb-3(b)(1), unless the authorization is terminated or revoked.     Resp Syncytial Virus by PCR NEGATIVE NEGATIVE Final    Comment: (NOTE) Fact Sheet for Patients: bloggercourse.com  Fact Sheet for Healthcare Providers: seriousbroker.it  This test is not yet approved  or cleared by the [place]  FDA and has been authorized for detection and/or diagnosis of SARS-CoV-2 by FDA under an Emergency Use Authorization (EUA). This EUA will remain in effect (meaning this test can be used) for the duration of the COVID-19 declaration under Section 564(b)(1) of the Act, 21 U.S.C. section 360bbb-3(b)(1), unless the authorization is terminated or revoked.  Performed at [facility], [address], [place], [place] [address]   Blood Culture (routine x 2)     Status: Abnormal (Preliminary result)   Collection Time: 11/16/24  4:11 PM   Specimen: BLOOD LEFT WRIST  Result Value Ref Range Status   Specimen Description   Final    BLOOD LEFT WRIST Performed at [facility], [address], [place], [place] [address]    Special Requests   Final    BOTTLES DRAWN AEROBIC AND ANAEROBIC Blood Culture results may not be optimal due to an inadequate volume of blood received in culture bottles Performed at [facility], [address], [place], [place] [address]    Culture  Setup Time   Final    GRAM NEGATIVE RODS ANAEROBIC BOTTLE ONLY CRITICAL RESULT CALLED TO, READ BACK BY AND VERIFIED WITH: PHARMD CHRISTINE SHADE [id number] AT 1120 BY EC GRAM POSITIVE COCCI AEROBIC BOTTLE ONLY CRITICAL VALUE NOTED.  VALUE IS CONSISTENT WITH PREVIOUSLY REPORTED AND CALLED VALUE.    Culture (A)  Final    PROTEUS MIRABILIS STAPHYLOCOCCUS EPIDERMIDIS STAPHYLOCOCCUS CAPITIS SUSCEPTIBILITIES TO FOLLOW Performed at [facility], [address] [place], [place] [address]    Report Status PENDING  Incomplete   Organism ID, Bacteria PROTEUS MIRABILIS  Final  Susceptibility   Proteus mirabilis - MIC*    AMPICILLIN <=2 SENSITIVE Sensitive     CEFAZOLIN  (NON-URINE) 4 INTERMEDIATE Intermediate     CEFEPIME  <=0.12 SENSITIVE Sensitive     ERTAPENEM <=0.12 SENSITIVE Sensitive     CEFTRIAXONE  <=0.25 SENSITIVE Sensitive     CIPROFLOXACIN   0.25 SENSITIVE Sensitive     GENTAMICIN <=1 SENSITIVE Sensitive     MEROPENEM 1 SENSITIVE Sensitive     TRIMETH /SULFA  <=20 SENSITIVE Sensitive     AMPICILLIN/SULBACTAM <=2 SENSITIVE Sensitive     PIP/TAZO Value in next row Sensitive      <=4 SENSITIVEThis is a modified FDA-approved test that has been validated and its performance characteristics determined by the reporting laboratory.  This laboratory is certified under the Clinical Laboratory Improvement Amendments CLIA as qualified to perform high complexity clinical laboratory testing.    * PROTEUS MIRABILIS  Blood Culture ID Panel (Reflexed)     Status: Abnormal   Collection Time: 11/16/24  4:11 PM  Result Value Ref Range Status   Enterococcus faecalis NOT DETECTED NOT DETECTED Final   Enterococcus Faecium NOT DETECTED NOT DETECTED Final   Listeria monocytogenes NOT DETECTED NOT DETECTED Final   Staphylococcus species DETECTED (A) NOT DETECTED Final    Comment: CRITICAL RESULT CALLED TO, READ BACK BY AND VERIFIED WITH: PHARMD CHRISTINE SHADE [id number] AT 1120 BY EC    Staphylococcus aureus (BCID) NOT DETECTED NOT DETECTED Final   Staphylococcus epidermidis DETECTED (A) NOT DETECTED Final    Comment: Methicillin (oxacillin) resistant coagulase negative staphylococcus. Possible blood culture contaminant (unless isolated from more than one blood culture draw or clinical case suggests pathogenicity). No antibiotic treatment is indicated for blood  culture contaminants. CRITICAL RESULT CALLED TO, READ BACK BY AND VERIFIED WITH: PHARMD CHRISTINE SHADE [id number] AT 1120 BY EC    Staphylococcus lugdunensis NOT DETECTED NOT DETECTED Final   Streptococcus species NOT DETECTED NOT DETECTED Final   Streptococcus agalactiae NOT DETECTED NOT DETECTED Final   Streptococcus pneumoniae NOT DETECTED NOT DETECTED Final   Streptococcus pyogenes NOT DETECTED NOT DETECTED Final   A.calcoaceticus-baumannii NOT DETECTED NOT DETECTED Final   Bacteroides  fragilis NOT DETECTED NOT DETECTED Final   Enterobacterales DETECTED (A) NOT DETECTED Final    Comment: Enterobacterales represent a large order of gram negative bacteria, not a single organism. CRITICAL RESULT CALLED TO, READ BACK BY AND VERIFIED WITH: PHARMD CHRISTINE SHADE [id number] AT 1120 BY EC    Enterobacter cloacae complex NOT DETECTED NOT DETECTED Final   Escherichia coli NOT DETECTED NOT DETECTED Final   Klebsiella aerogenes NOT DETECTED NOT DETECTED Final   Klebsiella oxytoca NOT DETECTED NOT DETECTED Final   Klebsiella pneumoniae NOT DETECTED NOT DETECTED Final   Proteus species DETECTED (A) NOT DETECTED Final    Comment: CRITICAL RESULT CALLED TO, READ BACK BY AND VERIFIED WITH: PHARMD CHRISTINE SHADE [id number] AT 1120 BY EC    Salmonella species NOT DETECTED NOT DETECTED Final   Serratia marcescens NOT DETECTED NOT DETECTED Final   Haemophilus influenzae NOT DETECTED NOT DETECTED Final   Neisseria meningitidis NOT DETECTED NOT DETECTED Final   Pseudomonas aeruginosa NOT DETECTED NOT DETECTED Final   Stenotrophomonas maltophilia NOT DETECTED NOT DETECTED Final   Candida albicans NOT DETECTED NOT DETECTED Final   Candida auris NOT DETECTED NOT DETECTED Final   Candida glabrata NOT DETECTED NOT DETECTED Final   Candida krusei NOT DETECTED NOT DETECTED Final   Candida parapsilosis NOT DETECTED NOT DETECTED Final   Candida tropicalis NOT  DETECTED NOT DETECTED Final   Cryptococcus neoformans/gattii NOT DETECTED NOT DETECTED Final   CTX-M ESBL NOT DETECTED NOT DETECTED Final   Carbapenem resistance IMP NOT DETECTED NOT DETECTED Final   Carbapenem resistance KPC NOT DETECTED NOT DETECTED Final   Methicillin resistance mecA/C DETECTED (A) NOT DETECTED Final    Comment: CRITICAL RESULT CALLED TO, READ BACK BY AND VERIFIED WITH: PHARMD CHRISTINE SHADE [id number] AT 1120 BY EC    Carbapenem resistance NDM NOT DETECTED NOT DETECTED Final   Carbapenem resist OXA 48 LIKE NOT DETECTED  NOT DETECTED Final   Carbapenem resistance VIM NOT DETECTED NOT DETECTED Final    Comment: Performed at [facility], [address], [place], [place] [address]  Blood Culture (routine x 2)     Status: Abnormal (Preliminary result)   Collection Time: 11/16/24  4:16 PM   Specimen: BLOOD RIGHT WRIST  Result Value Ref Range Status   Specimen Description   Final    BLOOD RIGHT WRIST Performed at [facility], [address] [place], [place] [address]    Special Requests   Final    BOTTLES DRAWN AEROBIC AND ANAEROBIC Blood Culture results may not be optimal due to an inadequate volume of blood received in culture bottles Performed at [facility], [address] [place], [place] [address]    Culture  Setup Time   Final    GRAM POSITIVE COCCI AEROBIC BOTTLE ONLY CRITICAL RESULT CALLED TO, READ BACK BY AND VERIFIED WITH: PHARMD CRYSTAL ROBERTSON [id number] AT 1402 BY EC GRAM NEGATIVE RODS ANAEROBIC BOTTLE ONLY CRITICAL VALUE NOTED.  VALUE IS CONSISTENT WITH PREVIOUSLY REPORTED AND CALLED VALUE. Performed at [facility], [address], [place], [place] [address]    Culture (A)  Final    STAPHYLOCOCCUS CAPITIS PROTEUS MIRABILIS STAPHYLOCOCCUS EPIDERMIDIS    Report Status PENDING  Incomplete  Urine Culture     Status: Abnormal   Collection Time: 11/16/24  4:39 PM   Specimen: Urine, Random  Result Value Ref Range Status   Specimen Description   Final    URINE, RANDOM Performed at [facility], [address] [place], [place] [address]    Special Requests   Final    NONE Reflexed from [id number] Performed at [facility], [address] [place], [place] [address]    Culture (A)  Final    >=100,000 COLONIES/mL PROTEUS MIRABILIS >=100,000 COLONIES/mL ESCHERICHIA COLI Confirmed Extended Spectrum Beta-Lactamase Producer (ESBL).  In bloodstream infections from ESBL organisms, carbapenems are preferred over  piperacillin/tazobactam. They are shown to have a lower risk of mortality.    Report Status 11/18/2024 FINAL  Final   Organism ID, Bacteria PROTEUS MIRABILIS (A)  Final   Organism ID, Bacteria ESCHERICHIA COLI (A)  Final      Susceptibility   Escherichia coli - MIC*    AMPICILLIN >=32 RESISTANT Resistant     CEFAZOLIN  (URINE) Value in next row Resistant      >=32 RESISTANTThis is a modified FDA-approved test that has been validated and its performance characteristics determined by the reporting laboratory.  This laboratory is certified under the Clinical Laboratory Improvement Amendments CLIA as qualified to perform high complexity clinical laboratory testing.    CEFEPIME  Value in next row Intermediate      >=32 RESISTANTThis is a modified FDA-approved test that has been validated and its performance characteristics determined by the reporting laboratory.  This laboratory is certified under the Clinical  Laboratory Improvement Amendments CLIA as qualified to perform high complexity clinical laboratory testing.    ERTAPENEM Value in next row Sensitive      >=32 RESISTANTThis is a modified FDA-approved test that has been validated and its performance characteristics determined by the reporting laboratory.  This laboratory is certified under the Clinical Laboratory Improvement Amendments CLIA as qualified to perform high complexity clinical laboratory testing.    CEFTRIAXONE  Value in next row Resistant      >=32 RESISTANTThis is a modified FDA-approved test that has been validated and its performance characteristics determined by the reporting laboratory.  This laboratory is certified under the Clinical Laboratory Improvement Amendments CLIA as qualified to perform high complexity clinical laboratory testing.    CIPROFLOXACIN  Value in next row Resistant      >=32 RESISTANTThis is a modified FDA-approved test that has been validated and its performance characteristics determined by the reporting  laboratory.  This laboratory is certified under the Clinical Laboratory Improvement Amendments CLIA as qualified to perform high complexity clinical laboratory testing.    GENTAMICIN Value in next row Sensitive      >=32 RESISTANTThis is a modified FDA-approved test that has been validated and its performance characteristics determined by the reporting laboratory.  This laboratory is certified under the Clinical Laboratory Improvement Amendments CLIA as qualified to perform high complexity clinical laboratory testing.    NITROFURANTOIN Value in next row Sensitive      >=32 RESISTANTThis is a modified FDA-approved test that has been validated and its performance characteristics determined by the reporting laboratory.  This laboratory is certified under the Clinical Laboratory Improvement Amendments CLIA as qualified to perform high complexity clinical laboratory testing.    TRIMETH /SULFA  Value in next row Resistant      >=32 RESISTANTThis is a modified FDA-approved test that has been validated and its performance characteristics determined by the reporting laboratory.  This laboratory is certified under the Clinical Laboratory Improvement Amendments CLIA as qualified to perform high complexity clinical laboratory testing.    AMPICILLIN/SULBACTAM Value in next row Resistant      >=32 RESISTANTThis is a modified FDA-approved test that has been validated and its performance characteristics determined by the reporting laboratory.  This laboratory is certified under the Clinical Laboratory Improvement Amendments CLIA as qualified to perform high complexity clinical laboratory testing.    PIP/TAZO Value in next row Sensitive      <=4 SENSITIVEThis is a modified FDA-approved test that has been validated and its performance characteristics determined by the reporting laboratory.  This laboratory is certified under the Clinical Laboratory Improvement Amendments CLIA as qualified to perform high complexity clinical  laboratory testing.    MEROPENEM  Value in next row Sensitive      <=4 SENSITIVEThis is a modified FDA-approved test that has been validated and its performance characteristics determined by the reporting laboratory.  This laboratory is certified under the Clinical Laboratory Improvement Amendments CLIA as qualified to perform high complexity clinical laboratory testing.    * >=100,000 COLONIES/mL ESCHERICHIA COLI   Proteus mirabilis - MIC*    AMPICILLIN Value in next row Sensitive      <=4 SENSITIVEThis is a modified FDA-approved test that has been validated and its performance characteristics determined by the reporting laboratory.  This laboratory is certified under the Clinical Laboratory Improvement Amendments CLIA as qualified to perform high complexity clinical laboratory testing.    CEFAZOLIN  (URINE) Value in next row Sensitive      4 SENSITIVEThis is a modified FDA-approved  test that has been validated and its performance characteristics determined by the reporting laboratory.  This laboratory is certified under the Clinical Laboratory Improvement Amendments CLIA as qualified to perform high complexity clinical laboratory testing.    CEFEPIME  Value in next row Sensitive      4 SENSITIVEThis is a modified FDA-approved test that has been validated and its performance characteristics determined by the reporting laboratory.  This laboratory is certified under the Clinical Laboratory Improvement Amendments CLIA as qualified to perform high complexity clinical laboratory testing.    ERTAPENEM Value in next row Sensitive      4 SENSITIVEThis is a modified FDA-approved test that has been validated and its performance characteristics determined by the reporting laboratory.  This laboratory is certified under the Clinical Laboratory Improvement Amendments CLIA as qualified to perform high complexity clinical laboratory testing.    CEFTRIAXONE  Value in next row Sensitive      4 SENSITIVEThis is a modified  FDA-approved test that has been validated and its performance characteristics determined by the reporting laboratory.  This laboratory is certified under the Clinical Laboratory Improvement Amendments CLIA as qualified to perform high complexity clinical laboratory testing.    CIPROFLOXACIN  Value in next row Sensitive      4 SENSITIVEThis is a modified FDA-approved test that has been validated and its performance characteristics determined by the reporting laboratory.  This laboratory is certified under the Clinical Laboratory Improvement Amendments CLIA as qualified to perform high complexity clinical laboratory testing.    GENTAMICIN Value in next row Sensitive      4 SENSITIVEThis is a modified FDA-approved test that has been validated and its performance characteristics determined by the reporting laboratory.  This laboratory is certified under the Clinical Laboratory Improvement Amendments CLIA as qualified to perform high complexity clinical laboratory testing.    NITROFURANTOIN Value in next row Resistant      4 SENSITIVEThis is a modified FDA-approved test that has been validated and its performance characteristics determined by the reporting laboratory.  This laboratory is certified under the Clinical Laboratory Improvement Amendments CLIA as qualified to perform high complexity clinical laboratory testing.    TRIMETH /SULFA  Value in next row Sensitive      4 SENSITIVEThis is a modified FDA-approved test that has been validated and its performance characteristics determined by the reporting laboratory.  This laboratory is certified under the Clinical Laboratory Improvement Amendments CLIA as qualified to perform high complexity clinical laboratory testing.    AMPICILLIN/SULBACTAM Value in next row Sensitive      4 SENSITIVEThis is a modified FDA-approved test that has been validated and its performance characteristics determined by the reporting laboratory.  This laboratory is certified under the  Clinical Laboratory Improvement Amendments CLIA as qualified to perform high complexity clinical laboratory testing.    PIP/TAZO Value in next row Sensitive      <=4 SENSITIVEThis is a modified FDA-approved test that has been validated and its performance characteristics determined by the reporting laboratory.  This laboratory is certified under the Clinical Laboratory Improvement Amendments CLIA as qualified to perform high complexity clinical laboratory testing.    MEROPENEM Value in next row Sensitive      <=4 SENSITIVEThis is a modified FDA-approved test that has been validated and its performance characteristics determined by the reporting laboratory.  This laboratory is certified under the Clinical Laboratory Improvement Amendments CLIA as qualified to perform high complexity clinical laboratory testing.    * >=100,000 COLONIES/mL PROTEUS MIRABILIS     Radiology  Studies: No results found.   Scheduled Meds:  aspirin  EC  81 mg Oral Daily   Chlorhexidine  Gluconate Cloth  6 each Topical Daily   heparin  5,000 Units Subcutaneous Q8H   insulin  aspart  0-6 Units Subcutaneous TID WC   tamsulosin   0.4 mg Oral Daily   Continuous Infusions:  meropenem (MERREM) IV 1 g (11/19/24 1247)   vancomycin 1,000 mg (11/19/24 1247)     LOS: 3 days   Almarie KANDICE Hoots, MD 11/19/2024, 2:36 PM

## 2024-11-19 NOTE — Plan of Care (Signed)
  Problem: Clinical Measurements: Goal: Ability to maintain clinical measurements within normal limits will improve Outcome: Progressing Goal: Will remain free from infection Outcome: Progressing Goal: Diagnostic test results will improve Outcome: Progressing Goal: Respiratory complications will improve Outcome: Progressing   Problem: Education: Goal: Knowledge of General Education information will improve Description: Including pain rating scale, medication(s)/side effects and non-pharmacologic comfort measures Outcome: Not Progressing   Problem: Health Behavior/Discharge Planning: Goal: Ability to manage health-related needs will improve Outcome: Not Progressing

## 2024-11-19 NOTE — Progress Notes (Signed)
 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 Family alerted [facility] that patient was not responding. Home visit completed by nurse. Patient was unresponsive and hypotensive. Family requested transfer to ED for evaluation. Patient was transported to ED from home. Patient is admitted for sepsis. Per Dr. Odella Pepper with [facility] this is a related hospital admission. Patient is a Full Code.   Patient seen at bedside. Daughter Tanner Griffin had just arrived and is waiting on her Aunt. They wanted to get an update from the RN. Patient opened his eyes but did not respond to any questions.  Continues to receive IV antibiotics. Urine and Blood Cultures back. Urine positive for ESBL Ecoli + proteus mirabilis, Blood positive for staph epi + proteus species.  Patient received air mattress and is resting comfortably.      Per chart review, patient remains GIP appropriate as he requires IV antibiotics.    Vital Signs: 98.7/69/18  148/79 100% 2L [place]   I/O: 50/250   Abnormal Labs:  11/19/24 04:40 Calcium : 8.4 (L) WBC: 13 (H) RBC: 2.55 (L) Hemoglobin: 7.7 (L) HCT: 24.5 (L)     Diagnostics: none new   IV/PRN Meds:  Meropenem 1g Q 8H, Vancomycin 1000mg  IV x1, Tylenol  1,000mg  Q6H PRN x1     Assessment and Plan per [name], MD  12.01.25   Principal Problem:   Sepsis [facility]) Active Problems:   Type II diabetes mellitus, well controlled (HCC)   Hyperlipidemia   Essential hypertension   BPH with obstruction/lower urinary tract symptoms   Malignant neoplasm of prostate (HCC)   Sacral decubitus ulcer, stage IV (HCC)     #1 sepsis secondary to urinary tract infection present on admission.  Patient has history of prostate cancer chronic Foley in place and is bedridden.  At time of admission it was noted he had hypotension lactic acidosis  and leukocytosis.  Initially started on Vanco Flagyl and cefepime .  Changed to vancomycin and meropenem once cultures came back.  Urine culture growing more than 100,000 colonies of Proteus and E. coli ESBL Blood culture Staph epidermidis methicillin resistance detected, Proteus positive, Foley catheter was changed by hospice at home last week per family.   #2 stage IV sacral decub seen by wound care,Cleanse sacral wound with Vashe, do not rinse. Using a Q tip applicator insert Vashe moistened kerlix roll gauze into wound bed making sure to cover area of depth, cover with dry gauze and ABD pad and tape or silicone foam whichever is preferred.  Cleanse R elbow wound with Vashe, do not rinse. Apply silver  hydrofiber ([place] [id number] Aquacel AG) to wound bed daily and secure with silicone foam or Kerlix roll gauze whichever is preferred.  SOAK SILVER  WITH NORMAL SALINE IF ADHERED TO WOUND BED FOR ATRAUMATIC REMOVAL.   #3 malignancy of prostate currently on no treatments/BPH patient was on hospice until this admission.  Family does not want hospice and wants to be full code. Continue Flomax .   #4 history of essential hypertension on Norvasc  and losartan  prior to admission these were held due to soft BP on admission.   #5 hyperlipidemia on statin   #6 type 2 diabetes CBG (last 3)      Wound Pressure Injury Elbow Posterior;Right Stage 3 -  Full thickness tissue loss. Subcutaneous fat may be visible but bone, tendon or muscle are NOT exposed. (Active)  Estimated body mass index is 20.78 kg/m as calculated from the following:   Height as of this encounter: 5' 8 (1.727 m).   Weight as of this encounter: 62 kg.   DVT prophylaxis: Lovenox  Code Status: Full code Family Communication: Discussed with patient's daughter  disposition Plan:  Status is: Inpatient Remains inpatient appropriate because: Acute illness   Consultants:  None   Procedures: None Antimicrobials: Anti-infectives  (From admission, onward)      Subjective: Asking for food   Objective:       Vitals:    11/18/24 1306 11/18/24 1945 11/19/24 0507 11/19/24 1127  BP: 124/74 125/71 137/78 (!) 148/79  Pulse: 72 82 77 69  Resp:   18 18 18   Temp: 99.1 F (37.3 C) 99.3 F (37.4 C) (!) 97.4 F (36.3 C) 98.7 F (37.1 C)  TempSrc: Oral Oral Oral    SpO2: 97% 100% 100% 100%                      Intake/Output Summary (Last 24 hours) at 11/19/2024 1436 Last data filed at 11/19/2024 0915    Gross per 24 hour  Intake 510 ml  Output 750 ml  Net -240 ml       Filed Weights    11/16/24 1515  Weight: 62 kg      Examination:   General exam: Appears chronically ill-appearing Respiratory system: Clear to auscultation. Respiratory effort normal. Cardiovascular system: Regular no tachycardia Gastrointestinal system: Abdomen is nondistended, soft and nontender. No organomegaly or masses felt. Normal bowel sounds heard. Central nervous system: Awake  extremities: No edema   Data Reviewed: I have personally reviewed following labs and imaging studies     Scheduled Meds:  aspirin  EC  81 mg Oral Daily   Chlorhexidine  Gluconate Cloth  6 each Topical Daily   heparin  5,000 Units Subcutaneous Q8H   insulin  aspart  0-6 Units Subcutaneous TID WC   tamsulosin   0.4 mg Oral Daily        Continuous Infusions:  meropenem (MERREM) IV 1 g (11/19/24 1247)   vancomycin 1,000 mg (11/19/24 1247)           LOS: 3 days    Almarie KANDICE Hoots, MD 11/19/2024, 2:36 PM     Code Status: Full   Discharge Planning: ongoing   Family Contact:  Spoke with daughter Tanner Griffin at bedside.    IDT: updated   Goals of Care: Full Code    Should patient need ambulance transfer at discharge- please use [facility]) as they contract this service for our active hospice patients.    Please call with any hospice related questions or concerns.   Greig Basket, BSN, RN [facility]  Liaison [phone]

## 2024-11-20 LAB — GLUCOSE, CAPILLARY
Glucose-Capillary: 77 mg/dL (ref 70–99)
Glucose-Capillary: 104 mg/dL — ABNORMAL HIGH (ref 70–99)
Glucose-Capillary: 106 mg/dL — ABNORMAL HIGH (ref 70–99)
Glucose-Capillary: 119 mg/dL — ABNORMAL HIGH (ref 70–99)

## 2024-11-20 LAB — CULTURE, BLOOD (ROUTINE X 2)

## 2024-11-20 LAB — BASIC METABOLIC PANEL WITH GFR
Anion gap: 4 — ABNORMAL LOW (ref 5–15)
BUN: 12 mg/dL (ref 8–23)
CO2: 26 mmol/L (ref 22–32)
Calcium: 8.8 mg/dL — ABNORMAL LOW (ref 8.9–10.3)
Chloride: 112 mmol/L — ABNORMAL HIGH (ref 98–111)
Creatinine, Ser: 0.53 mg/dL — ABNORMAL LOW (ref 0.61–1.24)
GFR, Estimated: >60 mL/min (ref >60)
Glucose, Bld: 82 mg/dL (ref 70–99)
Potassium: 4 mmol/L (ref 3.5–5.1)
Sodium: 141 mmol/L (ref 135–145)

## 2024-11-20 LAB — CBC
HCT: 25 % — ABNORMAL LOW (ref 39.0–52.0)
Hemoglobin: 7.5 g/dL — ABNORMAL LOW (ref 13.0–17.0)
MCH: 29.8 pg (ref 26.0–34.0)
MCHC: 30 g/dL (ref 30.0–36.0)
MCV: 99.2 fL (ref 80.0–100.0)
Platelets: 221 K/uL (ref 150–400)
RBC: 2.52 MIL/uL — ABNORMAL LOW (ref 4.22–5.81)
RDW: 15.9 % — ABNORMAL HIGH (ref 11.5–15.5)
WBC: 7.4 K/uL (ref 4.0–10.5)
nRBC: 0 % (ref 0.0–0.2)

## 2024-11-20 LAB — HEMOGLOBIN A1C

## 2024-11-20 LAB — MAGNESIUM: Magnesium: 1.8 mg/dL (ref 1.7–2.4)

## 2024-11-20 MED ORDER — AMLODIPINE BESYLATE 5 MG PO TABS
5.0000 mg | ORAL_TABLET | Freq: Every day | ORAL | Status: DC
  Administered 2024-11-20 – 2024-11-24 (×5): 5 mg via ORAL
  Filled 2024-11-20 (×5): qty 1

## 2024-11-20 MED ORDER — ORAL CARE MOUTH RINSE: 15.0000 mL | OROMUCOSAL | Status: DC | PRN

## 2024-11-20 NOTE — Progress Notes (Addendum)
 PROGRESS NOTE    Tanner Griffin  FMW:[id number] DOB: 1941/05/19 DOA: 11/16/2024 PCP: Haze Kingfisher, MD   Brief Narrative: This is a 83 year old male who lives at home with his family had a fall in June since then he has been bedridden and has not walked much has a history of prostate cancer hypertension has a chronic Foley.  Apparently the Foley catheter was changed by hospice last week per family.  Patient is not able to provide me any history.  He was admitted with concern for urinary tract infection and dehydration.  He was found to have an elevated lactate level started on Vanco cefepime  and Flagyl.  He also has a stage IV sacral decubitus.  He is seen by wound care.  Blood culture grew Staph epidermidis, Proteus, gram-negative Enterobacter, methicillin resistance detected  Assessment & Plan:   Principal Problem:   Sepsis (HCC) Active Problems:   Type II diabetes mellitus, well controlled (HCC)   Hyperlipidemia   Essential hypertension   BPH with obstruction/lower urinary tract symptoms   Malignant neoplasm of prostate (HCC)   Sacral decubitus ulcer, stage IV (HCC)   AKI (acute kidney injury)   #1 Sepsis secondary to Proteus and staph epidermidis bacteremia and ESBL E. coli urinary tract infection present on admission.  Patient has history of prostate cancer chronic Foley in place and is bedridden.  At time of admission it was noted he had hypotension lactic acidosis and leukocytosis.  Initially started on Vanco Flagyl and cefepime .   Changed to vancomycin and meropenem once cultures came back. Blood culture 2/2 Staph epidermidis methicillin resistance detected, Proteus positive.  Sensitivities pending. Will repeat blood culture 12/2 Foley catheter was changed by hospice at home last week per family. Urine culture positive for Proteus E. coli ESBL sensitive to ertapenem continue ertapenem for total of 5 days.(Started 11/18/2024)  #2 stage IV sacral decub seen by wound  care,Cleanse sacral wound with Vashe, do not rinse. Using a Q tip applicator insert Vashe moistened kerlix roll gauze into wound bed making sure to cover area of depth, cover with dry gauze and ABD pad and tape or silicone foam whichever is preferred.  Cleanse R elbow wound with Vashe, do not rinse. Apply silver  hydrofiber Soila [phone] Aquacel AG) to wound bed daily and secure with silicone foam or Kerlix roll gauze whichever is preferred.  Soak silver  with normal saline if adhered to wound bed for atraumatic removal.   #3 malignancy of prostate currently on no treatments/BPH patient was on hospice until this admission.  Family now wants to continue hospice.  #4 history of essential hypertension on Norvasc  and losartan  prior to admission these were held due to soft BP on admission. His blood pressure is starting to trend up 159/99 will restart antihypertensives.  #5 hyperlipidemia on statin  #6 type 2 diabetes CBG (last 3)  Recent Labs    11/19/24 1703 11/19/24 2024 11/20/24 0744  GLUCAP 83 108* 77    #7  Hypokalemia resolved after repletion.  Follow-up levels in AM.   #8 normocytic anemia  anemia of chronic disease 7.5 from 11.2 on admission however patient has been receiving IV fluids throughout partly hemodilution.  Will check FOBT.  No active signs of bleeding.  Wound Pressure Injury Elbow Posterior;Left Stage 3 -  Full thickness tissue loss. Subcutaneous fat may be visible but bone, tendon or muscle are NOT exposed. (Active)      Estimated body mass index is 20.78 kg/m as calculated from the following:  Height as of this encounter: 5' 8 (1.727 m).   Weight as of this encounter: 62 kg.  DVT prophylaxis: Lovenox  Code Status: Full code Family Communication: Discussed with patient's daughter  disposition Plan:  Status is: Inpatient Remains inpatient appropriate because: Acute illness   Consultants:  None  Procedures: None Antimicrobials: Anti-infectives (From  admission, onward)    Start     Dose/Rate Route Frequency Ordered Stop   11/18/24 1600  vancomycin (VANCOCIN) IVPB 1000 mg/200 mL premix  Status:  Discontinued        1,000 mg 200 mL/hr over 60 Minutes Intravenous Every 48 hours 11/17/24 1407 11/18/24 0922   11/18/24 1400  meropenem (MERREM) 1 g in sodium chloride  0.9 % 100 mL IVPB        1 g 200 mL/hr over 30 Minutes Intravenous Every 8 hours 11/18/24 0922     11/18/24 1200  vancomycin (VANCOCIN) IVPB 1000 mg/200 mL premix        1,000 mg 200 mL/hr over 60 Minutes Intravenous Every 24 hours 11/18/24 0922     11/17/24 1600  vancomycin (VANCOCIN) IVPB 1000 mg/200 mL premix  Status:  Discontinued        1,000 mg 200 mL/hr over 60 Minutes Intravenous Every 48 hours 11/16/24 2130 11/17/24 1227   11/17/24 1600  vancomycin (VANCOCIN) IVPB 1000 mg/200 mL premix  Status:  Discontinued        1,000 mg 200 mL/hr over 60 Minutes Intravenous Every 48 hours 11/17/24 1403 11/17/24 1407   11/17/24 1400  ceFEPIme  (MAXIPIME ) 2 g in sodium chloride  0.9 % 100 mL IVPB  Status:  Discontinued        2 g 200 mL/hr over 30 Minutes Intravenous Daily 11/16/24 2052 11/17/24 1227   11/17/24 1315  cefTRIAXone  (ROCEPHIN ) 2 g in sodium chloride  0.9 % 100 mL IVPB  Status:  Discontinued        2 g 200 mL/hr over 30 Minutes Intravenous Every 24 hours 11/17/24 1227 11/18/24 0922   11/17/24 0600  metroNIDAZOLE (FLAGYL) IVPB 500 mg  Status:  Discontinued        500 mg 100 mL/hr over 60 Minutes Intravenous 2 times daily 11/16/24 2052 11/17/24 1227   11/16/24 2145  vancomycin (VANCOCIN) IVPB 1000 mg/200 mL premix  Status:  Discontinued        1,000 mg 200 mL/hr over 60 Minutes Intravenous Every 24 hours 11/16/24 2052 11/16/24 2130   11/16/24 1530  ceFEPIme  (MAXIPIME ) 2 g in sodium chloride  0.9 % 100 mL IVPB        2 g 200 mL/hr over 30 Minutes Intravenous  Once 11/16/24 1523 11/16/24 1721   11/16/24 1530  metroNIDAZOLE (FLAGYL) IVPB 500 mg        500 mg 100 mL/hr over  60 Minutes Intravenous  Once 11/16/24 1523 11/16/24 1735   11/16/24 1530  vancomycin (VANCOCIN) IVPB 1000 mg/200 mL premix        1,000 mg 200 mL/hr over 60 Minutes Intravenous  Once 11/16/24 1523 11/16/24 1822        Subjective:  Patient resting in bed overnight there was no events concerns patient has good appetite and he has been eating every meal Foley in place cloudy urine  Objective: Vitals:   11/19/24 0507 11/19/24 1127 11/19/24 1932 11/20/24 0510  BP: 137/78 (!) 148/79 136/85 (!) 159/99  Pulse: 77 69 62 77  Resp: 18 18 17 17   Temp: (!) 97.4 F (36.3 C) 98.7 F (37.1 C) 97.8  F (36.6 C) 98.8 F (37.1 C)  TempSrc: Oral  Oral Oral  SpO2: 100% 100% 100% 100%  Weight:      Height:        Intake/Output Summary (Last 24 hours) at 11/20/2024 9176 Last data filed at 11/20/2024 0700 Gross per 24 hour  Intake 650 ml  Output 1400 ml  Net -750 ml   Filed Weights   11/16/24 1515  Weight: 62 kg    Examination:  General exam: Appears chronically ill-appearing Respiratory system: Clear to auscultation. Respiratory effort normal. Cardiovascular system: Regular no tachycardia Gastrointestinal system: Abdomen is nondistended, soft and nontender. No organomegaly or masses felt. Normal bowel sounds heard. Central nervous system: Awake  extremities: No edema  Data Reviewed: I have personally reviewed following labs and imaging studies  CBC: Recent Labs  Lab 11/16/24 1629 11/16/24 1827 11/17/24 1325 11/18/24 0540 11/19/24 0450 11/20/24 0534  WBC 22.9*  --  22.2* 14.6* 13.0* 7.4  NEUTROABS 20.9*  --   --   --   --   --   HGB 9.7* 11.2* 7.9* 7.6* 7.7* 7.5*  HCT 30.9* 33.0* 24.7* 23.9* 24.5* 25.0*  MCV 96.6  --  96.9 96.0 96.1 99.2  PLT 273  --  214 217 222 221   Basic Metabolic Panel: Recent Labs  Lab 11/16/24 1820 11/16/24 1827 11/16/24 2155 11/17/24 1325 11/18/24 0540 11/19/24 0450 11/20/24 0534  NA 138 139  --  136 138 141 141  K 4.7 5.3*  --  3.1* 2.8*  4.0 4.0  CL 108 109  --  107 107 111 112*  CO2 19*  --   --  23 24 24 26   GLUCOSE 99 103*  --  107* 93 80 82  BUN 24* 33*  --  27* 26* 18 12  CREATININE 2.06* 2.20* 1.74* 1.15 0.87 0.72 0.53*  CALCIUM  8.5*  --   --  8.2* 8.1* 8.4* 8.8*  MG  --   --   --  1.7 1.5*  --   --    GFR: Estimated Creatinine Clearance: 63.5 mL/min (A) (by C-G formula based on SCr of 0.53 mg/dL (L)). Liver Function Tests: Recent Labs  Lab 11/16/24 1820 11/17/24 1325  AST 41 44*  ALT 9 12  ALKPHOS 88 75  BILITOT 1.3* 0.7  PROT 6.4* 5.7*  ALBUMIN 2.5* 2.2*   No results for input(s): LIPASE, AMYLASE in the last 168 hours. No results for input(s): AMMONIA in the last 168 hours. Coagulation Profile: Recent Labs  Lab 11/16/24 2155  INR 1.5*   Cardiac Enzymes: No results for input(s): CKTOTAL, CKMB, CKMBINDEX, TROPONINI in the last 168 hours. BNP (last 3 results) No results for input(s): PROBNP in the last 8760 hours. HbA1C: No results for input(s): HGBA1C in the last 72 hours. CBG: Recent Labs  Lab 11/19/24 0730 11/19/24 1122 11/19/24 1703 11/19/24 2024 11/20/24 0744  GLUCAP 83 88 83 108* 77   Lipid Profile: No results for input(s): CHOL, HDL, LDLCALC, TRIG, CHOLHDL, LDLDIRECT in the last 72 hours. Thyroid  Function Tests: No results for input(s): TSH, T4TOTAL, FREET4, T3FREE, THYROIDAB in the last 72 hours. Anemia Panel: No results for input(s): VITAMINB12, FOLATE, FERRITIN, TIBC, IRON, RETICCTPCT in the last 72 hours. Sepsis Labs: Recent Labs  Lab 11/16/24 1620 11/16/24 1826  LATICACIDVEN 2.3* 2.9*    Recent Results (from the past 240 hours)  Resp panel by RT-PCR (RSV, Flu A&B, Covid) Anterior Nasal Swab     Status: None  Collection Time: 11/16/24  4:10 PM   Specimen: Anterior Nasal Swab  Result Value Ref Range Status   SARS Coronavirus 2 by RT PCR NEGATIVE NEGATIVE Final    Comment: (NOTE) SARS-CoV-2 target nucleic acids are  NOT DETECTED.  The SARS-CoV-2 RNA is generally detectable in upper respiratory specimens during the acute phase of infection. The lowest concentration of SARS-CoV-2 viral copies this assay can detect is 138 copies/mL. A negative result does not preclude SARS-Cov-2 infection and should not be used as the sole basis for treatment or other patient management decisions. A negative result may occur with  improper specimen collection/handling, submission of specimen other than nasopharyngeal swab, presence of viral mutation(s) within the areas targeted by this assay, and inadequate number of viral copies(<138 copies/mL). A negative result must be combined with clinical observations, patient history, and epidemiological information. The expected result is Negative.  Fact Sheet for Patients:  bloggercourse.com  Fact Sheet for Healthcare Providers:  seriousbroker.it  This test is no t yet approved or cleared by the [place]  FDA and  has been authorized for detection and/or diagnosis of SARS-CoV-2 by FDA under an Emergency Use Authorization (EUA). This EUA will remain  in effect (meaning this test can be used) for the duration of the COVID-19 declaration under Section 564(b)(1) of the Act, 21 U.S.C.section 360bbb-3(b)(1), unless the authorization is terminated  or revoked sooner.       Influenza A by PCR NEGATIVE NEGATIVE Final   Influenza B by PCR NEGATIVE NEGATIVE Final    Comment: (NOTE) The Xpert Xpress SARS-CoV-2/FLU/RSV plus assay is intended as an aid in the diagnosis of influenza from Nasopharyngeal swab specimens and should not be used as a sole basis for treatment. Nasal washings and aspirates are unacceptable for Xpert Xpress SARS-CoV-2/FLU/RSV testing.  Fact Sheet for Patients: bloggercourse.com  Fact Sheet for Healthcare Providers: seriousbroker.it  This test is not  yet approved or cleared by the [place]  FDA and has been authorized for detection and/or diagnosis of SARS-CoV-2 by FDA under an Emergency Use Authorization (EUA). This EUA will remain in effect (meaning this test can be used) for the duration of the COVID-19 declaration under Section 564(b)(1) of the Act, 21 U.S.C. section 360bbb-3(b)(1), unless the authorization is terminated or revoked.     Resp Syncytial Virus by PCR NEGATIVE NEGATIVE Final    Comment: (NOTE) Fact Sheet for Patients: bloggercourse.com  Fact Sheet for Healthcare Providers: seriousbroker.it  This test is not yet approved or cleared by the [place]  FDA and has been authorized for detection and/or diagnosis of SARS-CoV-2 by FDA under an Emergency Use Authorization (EUA). This EUA will remain in effect (meaning this test can be used) for the duration of the COVID-19 declaration under Section 564(b)(1) of the Act, 21 U.S.C. section 360bbb-3(b)(1), unless the authorization is terminated or revoked.  Performed at [facility], [address], [place], [place] [address]   Blood Culture (routine x 2)     Status: Abnormal (Preliminary result)   Collection Time: 11/16/24  4:11 PM   Specimen: BLOOD LEFT WRIST  Result Value Ref Range Status   Specimen Description   Final    BLOOD LEFT WRIST Performed at [facility], [address], [place], [place] [address]    Special Requests   Final    BOTTLES DRAWN AEROBIC AND ANAEROBIC Blood Culture results may not be optimal due to an inadequate volume of blood received in culture bottles Performed at [facility], [address], [place], [place] [address]    Culture  Setup Time   Final    GRAM NEGATIVE RODS ANAEROBIC BOTTLE ONLY CRITICAL RESULT CALLED TO, READ BACK BY AND VERIFIED WITH: PHARMD CHRISTINE SHADE [id number] AT 1120 BY EC GRAM POSITIVE COCCI AEROBIC BOTTLE  ONLY CRITICAL VALUE NOTED.  VALUE IS CONSISTENT WITH PREVIOUSLY REPORTED AND CALLED VALUE.    Culture (A)  Final    PROTEUS MIRABILIS STAPHYLOCOCCUS EPIDERMIDIS STAPHYLOCOCCUS CAPITIS SUSCEPTIBILITIES TO FOLLOW Performed at [facility], [address] [place], [place] [address]    Report Status PENDING  Incomplete   Organism ID, Bacteria PROTEUS MIRABILIS  Final      Susceptibility   Proteus mirabilis - MIC*    AMPICILLIN <=2 SENSITIVE Sensitive     CEFAZOLIN  (NON-URINE) 4 INTERMEDIATE Intermediate     CEFEPIME  <=0.12 SENSITIVE Sensitive     ERTAPENEM <=0.12 SENSITIVE Sensitive     CEFTRIAXONE  <=0.25 SENSITIVE Sensitive     CIPROFLOXACIN  0.25 SENSITIVE Sensitive     GENTAMICIN <=1 SENSITIVE Sensitive     MEROPENEM  1 SENSITIVE Sensitive     TRIMETH /SULFA  <=20 SENSITIVE Sensitive     AMPICILLIN/SULBACTAM <=2 SENSITIVE Sensitive     PIP/TAZO Value in next row Sensitive      <=4 SENSITIVEThis is a modified FDA-approved test that has been validated and its performance characteristics determined by the reporting laboratory.  This laboratory is certified under the Clinical Laboratory Improvement Amendments CLIA as qualified to perform high complexity clinical laboratory testing.    * PROTEUS MIRABILIS  Blood Culture ID Panel (Reflexed)     Status: Abnormal   Collection Time: 11/16/24  4:11 PM  Result Value Ref Range Status   Enterococcus faecalis NOT DETECTED NOT DETECTED Final   Enterococcus Faecium NOT DETECTED NOT DETECTED Final   Listeria monocytogenes NOT DETECTED NOT DETECTED Final   Staphylococcus species DETECTED (A) NOT DETECTED Final    Comment: CRITICAL RESULT CALLED TO, READ BACK BY AND VERIFIED WITH: PHARMD CHRISTINE SHADE [id number] AT 1120 BY EC    Staphylococcus aureus (BCID) NOT DETECTED NOT DETECTED Final   Staphylococcus epidermidis DETECTED (A) NOT DETECTED Final    Comment: Methicillin (oxacillin) resistant coagulase negative staphylococcus. Possible blood  culture contaminant (unless isolated from more than one blood culture draw or clinical case suggests pathogenicity). No antibiotic treatment is indicated for blood  culture contaminants. CRITICAL RESULT CALLED TO, READ BACK BY AND VERIFIED WITH: PHARMD CHRISTINE SHADE [id number] AT 1120 BY EC    Staphylococcus lugdunensis NOT DETECTED NOT DETECTED Final   Streptococcus species NOT DETECTED NOT DETECTED Final   Streptococcus agalactiae NOT DETECTED NOT DETECTED Final   Streptococcus pneumoniae NOT DETECTED NOT DETECTED Final   Streptococcus pyogenes NOT DETECTED NOT DETECTED Final   A.calcoaceticus-baumannii NOT DETECTED NOT DETECTED Final   Bacteroides fragilis NOT DETECTED NOT DETECTED Final   Enterobacterales DETECTED (A) NOT DETECTED Final    Comment: Enterobacterales represent a large order of gram negative bacteria, not a single organism. CRITICAL RESULT CALLED TO, READ BACK BY AND VERIFIED WITH: PHARMD CHRISTINE SHADE [id number] AT 1120 BY EC    Enterobacter cloacae complex NOT DETECTED NOT DETECTED Final   Escherichia coli NOT DETECTED NOT DETECTED Final   Klebsiella aerogenes NOT DETECTED NOT DETECTED Final   Klebsiella oxytoca NOT DETECTED NOT DETECTED Final   Klebsiella pneumoniae NOT DETECTED NOT DETECTED Final   Proteus species DETECTED (A) NOT DETECTED Final    Comment: CRITICAL RESULT CALLED TO, READ  BACK BY AND VERIFIED WITH: PHARMD CHRISTINE SHADE [id number] AT 1120 BY EC    Salmonella species NOT DETECTED NOT DETECTED Final   Serratia marcescens NOT DETECTED NOT DETECTED Final   Haemophilus influenzae NOT DETECTED NOT DETECTED Final   Neisseria meningitidis NOT DETECTED NOT DETECTED Final   Pseudomonas aeruginosa NOT DETECTED NOT DETECTED Final   Stenotrophomonas maltophilia NOT DETECTED NOT DETECTED Final   Candida albicans NOT DETECTED NOT DETECTED Final   Candida auris NOT DETECTED NOT DETECTED Final   Candida glabrata NOT DETECTED NOT DETECTED Final   Candida krusei  NOT DETECTED NOT DETECTED Final   Candida parapsilosis NOT DETECTED NOT DETECTED Final   Candida tropicalis NOT DETECTED NOT DETECTED Final   Cryptococcus neoformans/gattii NOT DETECTED NOT DETECTED Final   CTX-M ESBL NOT DETECTED NOT DETECTED Final   Carbapenem resistance IMP NOT DETECTED NOT DETECTED Final   Carbapenem resistance KPC NOT DETECTED NOT DETECTED Final   Methicillin resistance mecA/C DETECTED (A) NOT DETECTED Final    Comment: CRITICAL RESULT CALLED TO, READ BACK BY AND VERIFIED WITH: PHARMD CHRISTINE SHADE [id number] AT 1120 BY EC    Carbapenem resistance NDM NOT DETECTED NOT DETECTED Final   Carbapenem resist OXA 48 LIKE NOT DETECTED NOT DETECTED Final   Carbapenem resistance VIM NOT DETECTED NOT DETECTED Final    Comment: Performed at [facility], [address] [place], [place] [address]  Blood Culture (routine x 2)     Status: Abnormal (Preliminary result)   Collection Time: 11/16/24  4:16 PM   Specimen: BLOOD RIGHT WRIST  Result Value Ref Range Status   Specimen Description   Final    BLOOD RIGHT WRIST Performed at [facility], [address], [place], [place] [address]    Special Requests   Final    BOTTLES DRAWN AEROBIC AND ANAEROBIC Blood Culture results may not be optimal due to an inadequate volume of blood received in culture bottles Performed at [facility], [address] [place], [place] [address]    Culture  Setup Time   Final    GRAM POSITIVE COCCI AEROBIC BOTTLE ONLY CRITICAL RESULT CALLED TO, READ BACK BY AND VERIFIED WITH: PHARMD CRYSTAL ROBERTSON [id number] AT 1402 BY EC GRAM NEGATIVE RODS ANAEROBIC BOTTLE ONLY CRITICAL VALUE NOTED.  VALUE IS CONSISTENT WITH PREVIOUSLY REPORTED AND CALLED VALUE. Performed at [facility], [address], [place], [place] [address]    Culture (A)  Final    STAPHYLOCOCCUS CAPITIS PROTEUS MIRABILIS STAPHYLOCOCCUS EPIDERMIDIS    Report Status PENDING  Incomplete  Urine  Culture     Status: Abnormal   Collection Time: 11/16/24  4:39 PM   Specimen: Urine, Random  Result Value Ref Range Status   Specimen Description   Final    URINE, RANDOM Performed at [facility], [address], [place], [place] [address]    Special Requests   Final    NONE Reflexed from [id number] Performed at [facility], [address], [place], [place] [address]    Culture (A)  Final    >=100,000 COLONIES/mL PROTEUS MIRABILIS >=100,000 COLONIES/mL ESCHERICHIA COLI Confirmed Extended Spectrum Beta-Lactamase Producer (ESBL).  In bloodstream infections from ESBL organisms, carbapenems are preferred over piperacillin/tazobactam. They are shown to have a lower risk of mortality.    Report Status 11/18/2024 FINAL  Final   Organism ID, Bacteria PROTEUS MIRABILIS (A)  Final   Organism ID, Bacteria ESCHERICHIA COLI (A)  Final  Susceptibility   Escherichia coli - MIC*    AMPICILLIN >=32 RESISTANT Resistant     CEFAZOLIN  (URINE) Value in next row Resistant      >=32 RESISTANTThis is a modified FDA-approved test that has been validated and its performance characteristics determined by the reporting laboratory.  This laboratory is certified under the Clinical Laboratory Improvement Amendments CLIA as qualified to perform high complexity clinical laboratory testing.    CEFEPIME  Value in next row Intermediate      >=32 RESISTANTThis is a modified FDA-approved test that has been validated and its performance characteristics determined by the reporting laboratory.  This laboratory is certified under the Clinical Laboratory Improvement Amendments CLIA as qualified to perform high complexity clinical laboratory testing.    ERTAPENEM Value in next row Sensitive      >=32 RESISTANTThis is a modified FDA-approved test that has been validated and its performance characteristics determined by the reporting laboratory.  This laboratory is certified under the Clinical  Laboratory Improvement Amendments CLIA as qualified to perform high complexity clinical laboratory testing.    CEFTRIAXONE  Value in next row Resistant      >=32 RESISTANTThis is a modified FDA-approved test that has been validated and its performance characteristics determined by the reporting laboratory.  This laboratory is certified under the Clinical Laboratory Improvement Amendments CLIA as qualified to perform high complexity clinical laboratory testing.    CIPROFLOXACIN  Value in next row Resistant      >=32 RESISTANTThis is a modified FDA-approved test that has been validated and its performance characteristics determined by the reporting laboratory.  This laboratory is certified under the Clinical Laboratory Improvement Amendments CLIA as qualified to perform high complexity clinical laboratory testing.    GENTAMICIN Value in next row Sensitive      >=32 RESISTANTThis is a modified FDA-approved test that has been validated and its performance characteristics determined by the reporting laboratory.  This laboratory is certified under the Clinical Laboratory Improvement Amendments CLIA as qualified to perform high complexity clinical laboratory testing.    NITROFURANTOIN Value in next row Sensitive      >=32 RESISTANTThis is a modified FDA-approved test that has been validated and its performance characteristics determined by the reporting laboratory.  This laboratory is certified under the Clinical Laboratory Improvement Amendments CLIA as qualified to perform high complexity clinical laboratory testing.    TRIMETH /SULFA  Value in next row Resistant      >=32 RESISTANTThis is a modified FDA-approved test that has been validated and its performance characteristics determined by the reporting laboratory.  This laboratory is certified under the Clinical Laboratory Improvement Amendments CLIA as qualified to perform high complexity clinical laboratory testing.    AMPICILLIN/SULBACTAM Value in next row  Resistant      >=32 RESISTANTThis is a modified FDA-approved test that has been validated and its performance characteristics determined by the reporting laboratory.  This laboratory is certified under the Clinical Laboratory Improvement Amendments CLIA as qualified to perform high complexity clinical laboratory testing.    PIP/TAZO Value in next row Sensitive      <=4 SENSITIVEThis is a modified FDA-approved test that has been validated and its performance characteristics determined by the reporting laboratory.  This laboratory is certified under the Clinical Laboratory Improvement Amendments CLIA as qualified to perform high complexity clinical laboratory testing.    MEROPENEM Value in next row Sensitive      <=4 SENSITIVEThis is a modified FDA-approved test that has been validated and its performance characteristics determined by the  reporting laboratory.  This laboratory is certified under the Clinical Laboratory Improvement Amendments CLIA as qualified to perform high complexity clinical laboratory testing.    * >=100,000 COLONIES/mL ESCHERICHIA COLI   Proteus mirabilis - MIC*    AMPICILLIN Value in next row Sensitive      <=4 SENSITIVEThis is a modified FDA-approved test that has been validated and its performance characteristics determined by the reporting laboratory.  This laboratory is certified under the Clinical Laboratory Improvement Amendments CLIA as qualified to perform high complexity clinical laboratory testing.    CEFAZOLIN  (URINE) Value in next row Sensitive      4 SENSITIVEThis is a modified FDA-approved test that has been validated and its performance characteristics determined by the reporting laboratory.  This laboratory is certified under the Clinical Laboratory Improvement Amendments CLIA as qualified to perform high complexity clinical laboratory testing.    CEFEPIME  Value in next row Sensitive      4 SENSITIVEThis is a modified FDA-approved test that has been validated and its  performance characteristics determined by the reporting laboratory.  This laboratory is certified under the Clinical Laboratory Improvement Amendments CLIA as qualified to perform high complexity clinical laboratory testing.    ERTAPENEM Value in next row Sensitive      4 SENSITIVEThis is a modified FDA-approved test that has been validated and its performance characteristics determined by the reporting laboratory.  This laboratory is certified under the Clinical Laboratory Improvement Amendments CLIA as qualified to perform high complexity clinical laboratory testing.    CEFTRIAXONE  Value in next row Sensitive      4 SENSITIVEThis is a modified FDA-approved test that has been validated and its performance characteristics determined by the reporting laboratory.  This laboratory is certified under the Clinical Laboratory Improvement Amendments CLIA as qualified to perform high complexity clinical laboratory testing.    CIPROFLOXACIN  Value in next row Sensitive      4 SENSITIVEThis is a modified FDA-approved test that has been validated and its performance characteristics determined by the reporting laboratory.  This laboratory is certified under the Clinical Laboratory Improvement Amendments CLIA as qualified to perform high complexity clinical laboratory testing.    GENTAMICIN Value in next row Sensitive      4 SENSITIVEThis is a modified FDA-approved test that has been validated and its performance characteristics determined by the reporting laboratory.  This laboratory is certified under the Clinical Laboratory Improvement Amendments CLIA as qualified to perform high complexity clinical laboratory testing.    NITROFURANTOIN Value in next row Resistant      4 SENSITIVEThis is a modified FDA-approved test that has been validated and its performance characteristics determined by the reporting laboratory.  This laboratory is certified under the Clinical Laboratory Improvement Amendments CLIA as qualified to  perform high complexity clinical laboratory testing.    TRIMETH /SULFA  Value in next row Sensitive      4 SENSITIVEThis is a modified FDA-approved test that has been validated and its performance characteristics determined by the reporting laboratory.  This laboratory is certified under the Clinical Laboratory Improvement Amendments CLIA as qualified to perform high complexity clinical laboratory testing.    AMPICILLIN/SULBACTAM Value in next row Sensitive      4 SENSITIVEThis is a modified FDA-approved test that has been validated and its performance characteristics determined by the reporting laboratory.  This laboratory is certified under the Clinical Laboratory Improvement Amendments CLIA as qualified to perform high complexity clinical laboratory testing.    PIP/TAZO Value in next row Sensitive      <=  4 SENSITIVEThis is a modified FDA-approved test that has been validated and its performance characteristics determined by the reporting laboratory.  This laboratory is certified under the Clinical Laboratory Improvement Amendments CLIA as qualified to perform high complexity clinical laboratory testing.    MEROPENEM  Value in next row Sensitive      <=4 SENSITIVEThis is a modified FDA-approved test that has been validated and its performance characteristics determined by the reporting laboratory.  This laboratory is certified under the Clinical Laboratory Improvement Amendments CLIA as qualified to perform high complexity clinical laboratory testing.    * >=100,000 COLONIES/mL PROTEUS MIRABILIS     Radiology Studies: No results found.   Scheduled Meds:  aspirin  EC  81 mg Oral Daily   Chlorhexidine  Gluconate Cloth  6 each Topical Daily   heparin   5,000 Units Subcutaneous Q8H   insulin  aspart  0-6 Units Subcutaneous TID WC   tamsulosin   0.4 mg Oral Daily   Continuous Infusions:  meropenem  (MERREM ) IV 1 g (11/20/24 0517)   vancomycin  1,000 mg (11/19/24 1247)     LOS: 4 days   Almarie KANDICE Hoots, MD 11/20/2024, 8:23 AM

## 2024-11-20 NOTE — Progress Notes (Signed)
 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 Family alerted [facility] that patient was not responding. Home visit completed by nurse. Patient was unresponsive and hypotensive. Family requested transfer to ED for evaluation. Patient was transported to ED from home. Patient is admitted for sepsis. Per Dr. Odella Pepper with [facility] this is a related hospital admission. Patient is a Full Code.   Patient seen at bedside, appeared to be sleeping comfortably. No family present. Called daughter [name] and clarified that they still want the patient under hospice services upon discharge.  She said yes, they would like him to continue with hospice at home. The social worker asked if they were going to revoke so I just wanted to make sure.   Continues to receive IV antibiotics. Urine and Blood Cultures back. Urine positive for ESBL Ecoli + proteus mirabilis, Blood positive for staph epi + proteus species.  Patient received air mattress and is resting comfortably.      Per chart review, patient remains GIP appropriate as he requires IV antibiotics.    Vital Signs: 98.2/76/18  139/93, 100% 2L [place]   I/O: 360/not documented   Abnormal Labs:  11/20/24 05:34 Calcium : 8.8 (L) RBC: 2.52 (L) Hemoglobin: 7.5 (L) HCT: 25.0(L) Cr 0.53 (L) Anion gap 4 (L) RDW 15.9 (H) Diagnostics: none new   IV/PRN Meds:  Meropenem 1g Q 8H, Vancomycin 1000mg  IV x1, Tylenol  1,000mg  Q6H PRN x0   Assessment and Plan per [name], MD  12.02.25  Principal Problem:   Sepsis [facility]) Active Problems:   Type II diabetes mellitus, well controlled (HCC)   Hyperlipidemia   Essential hypertension   BPH with obstruction/lower urinary tract symptoms   Malignant neoplasm of prostate (HCC)   Sacral decubitus ulcer, stage IV (HCC)   AKI (acute kidney injury)     #1  Sepsis secondary to Proteus and staph epidermidis bacteremia and ESBL E. coli urinary tract infection present on admission.  Patient has history of prostate cancer chronic Foley in place and is bedridden.  At time of admission it was noted he had hypotension lactic acidosis and leukocytosis.  Initially started on Vanco Flagyl and cefepime .   Changed to vancomycin and meropenem once cultures came back.  Urine culture growing more than 100,000 colonies of Proteus and E. coli ESBL Blood culture 2/2 Staph epidermidis methicillin resistance detected, Proteus positive Will repeat blood culture 12/2 Foley catheter was changed by hospice at home last week per family. Urine culture positive for Proteus E. coli ESBL sensitive to ertapenem   #2 stage IV sacral decub seen by wound care,Cleanse sacral wound with Vashe, do not rinse. Using a Q tip applicator insert Vashe moistened kerlix roll gauze into wound bed making sure to cover area of depth, cover with dry gauze and ABD pad and tape or silicone foam whichever is preferred.  Cleanse R elbow wound with Vashe, do not rinse. Apply silver  hydrofiber ([place] [phone] Aquacel [employer]) to wound bed daily and secure with silicone foam or Kerlix roll gauze whichever is preferred.  Soak silver  with normal saline if adhered to wound bed for atraumatic removal.    #3 malignancy of prostate currently on no treatments/BPH patient was on hospice until this admission.  Family does not want hospice and wants to be full code. Continue Flomax .   #4 history of essential hypertension on Norvasc  and losartan  prior to  admission these were held due to soft BP on admission. His blood pressure is starting to trend up 159/99 will restart antihypertensives.   #5 hyperlipidemia on statin   #6 type 2 diabetes CBG (last 3)  Recent Labs (last 2 labs)       Recent Labs    11/19/24 1703 11/19/24 2024 11/20/24 0744  GLUCAP 83 108* 77        #7  Hypokalemia resolved after repletion.   Follow-up levels in AM.    #8 normocytic anemia Ackley from anemia of chronic disease 7.5 from 11.2 on admission however patient has been receiving IV fluids throughout partly hemodilution.  Will check FOBT.  No active signs of bleeding.   Wound Pressure Injury Elbow Posterior;Left Stage 3 -  Full thickness tissue loss. Subcutaneous fat may be visible but bone, tendon or muscle are NOT exposed. (Active)          Estimated body mass index is 20.78 kg/m as calculated from the following:   Height as of this encounter: 5' 8 (1.727 m).   Weight as of this encounter: 62 kg.   DVT prophylaxis: Lovenox  Code Status: Full code Family Communication: Discussed with patient's daughter  disposition Plan:  Status is: Inpatient Remains inpatient appropriate because: Acute illness   Consultants:  None   Procedures: None Antimicrobials: Anti-infectives (From admission, onward)        Start     Dose/Rate Route Frequency Ordered Stop    11/18/24 1600   vancomycin (VANCOCIN) IVPB 1000 mg/200 mL premix  Status:  Discontinued        1,000 mg 200 mL/hr over 60 Minutes Intravenous Every 48 hours 11/17/24 1407 11/18/24 0922    11/18/24 1400   meropenem (MERREM) 1 g in sodium chloride  0.9 % 100 mL IVPB        1 g 200 mL/hr over 30 Minutes Intravenous Every 8 hours 11/18/24 0922      11/18/24 1200   vancomycin (VANCOCIN) IVPB 1000 mg/200 mL premix        1,000 mg 200 mL/hr over 60 Minutes Intravenous Every 24 hours 11/18/24 0922      11/17/24 1600   vancomycin (VANCOCIN) IVPB 1000 mg/200 mL premix  Status:  Discontinued        1,000 mg 200 mL/hr over 60 Minutes Intravenous Every 48 hours 11/16/24 2130 11/17/24 1227    11/17/24 1600   vancomycin (VANCOCIN) IVPB 1000 mg/200 mL premix  Status:  Discontinued        1,000 mg 200 mL/hr over 60 Minutes Intravenous Every 48 hours 11/17/24 1403 11/17/24 1407    11/17/24 1400   ceFEPIme  (MAXIPIME ) 2 g in sodium chloride  0.9 % 100 mL IVPB  Status:   Discontinued        2 g 200 mL/hr over 30 Minutes Intravenous Daily 11/16/24 2052 11/17/24 1227    11/17/24 1315   cefTRIAXone  (ROCEPHIN ) 2 g in sodium chloride  0.9 % 100 mL IVPB  Status:  Discontinued        2 g 200 mL/hr over 30 Minutes Intravenous Every 24 hours 11/17/24 1227 11/18/24 0922    11/17/24 0600   metroNIDAZOLE (FLAGYL) IVPB 500 mg  Status:  Discontinued        500 mg 100 mL/hr over 60 Minutes Intravenous 2 times daily 11/16/24 2052 11/17/24 1227    11/16/24 2145   vancomycin (VANCOCIN) IVPB 1000 mg/200 mL premix  Status:  Discontinued        1,000  mg 200 mL/hr over 60 Minutes Intravenous Every 24 hours 11/16/24 2052 11/16/24 2130    11/16/24 1530   ceFEPIme  (MAXIPIME ) 2 g in sodium chloride  0.9 % 100 mL IVPB        2 g 200 mL/hr over 30 Minutes Intravenous  Once 11/16/24 1523 11/16/24 1721    11/16/24 1530   metroNIDAZOLE  (FLAGYL ) IVPB 500 mg        500 mg 100 mL/hr over 60 Minutes Intravenous  Once 11/16/24 1523 11/16/24 1735    11/16/24 1530   vancomycin  (VANCOCIN ) IVPB 1000 mg/200 mL premix        1,000 mg 200 mL/hr over 60 Minutes Intravenous  Once 11/16/24 1523 11/16/24 1822               Subjective:   Patient resting in bed overnight there was no events concerns patient has good appetite and he has been eating every meal Foley in place cloudy urine   Objective:       Vitals:    11/19/24 0507 11/19/24 1127 11/19/24 1932 11/20/24 0510  BP: 137/78 (!) 148/79 136/85 (!) 159/99  Pulse: 77 69 62 77  Resp: 18 18 17 17   Temp: (!) 97.4 F (36.3 C) 98.7 F (37.1 C) 97.8 F (36.6 C) 98.8 F (37.1 C)  TempSrc: Oral   Oral Oral  SpO2: 100% 100% 100% 100%  Weight:          Height:              Intake/Output Summary (Last 24 hours) at 11/20/2024 9176 Last data filed at 11/20/2024 0700    Gross per 24 hour  Intake 650 ml  Output 1400 ml  Net -750 ml       Filed Weights    11/16/24 1515  Weight: 62 kg      Examination:   General exam: Appears  chronically ill-appearing Respiratory system: Clear to auscultation. Respiratory effort normal. Cardiovascular system: Regular no tachycardia Gastrointestinal system: Abdomen is nondistended, soft and nontender. No organomegaly or masses felt. Normal bowel sounds heard. Central nervous system: Awake  extremities: No edema     Scheduled Meds:  aspirin  EC  81 mg Oral Daily   Chlorhexidine  Gluconate Cloth  6 each Topical Daily   heparin   5,000 Units Subcutaneous Q8H   insulin  aspart  0-6 Units Subcutaneous TID WC   tamsulosin   0.4 mg Oral Daily        Continuous Infusions:  meropenem  (MERREM ) IV 1 g (11/20/24 0517)   vancomycin  1,000 mg (11/19/24 1247)           LOS: 4 days    Almarie KANDICE Hoots, MD 11/20/2024, 8:23 AM   Code Status: Full   Discharge Planning: ongoing   Family Contact:  Spoke with daughter [name] at bedside.    IDT: updated   Goals of Care: Full Code    Should patient need ambulance transfer at discharge- please use [facility]) as they contract this service for our active hospice patients.    Please call with any hospice related questions or concerns.   Greig Basket, BSN, RN [facility] Liaison [phone]

## 2024-11-20 NOTE — Plan of Care (Signed)

## 2024-11-20 NOTE — Plan of Care (Signed)
  Problem: Education: Goal: Knowledge of General Education information will improve Description: Including pain rating scale, medication(s)/side effects and non-pharmacologic comfort measures 11/20/2024 0525 by Rosalva Reena HERO, RN Outcome: Not Progressing 11/20/2024 0523 by Rosalva Reena HERO, RN Outcome: Progressing   Problem: Health Behavior/Discharge Planning: Goal: Ability to manage health-related needs will improve 11/20/2024 0523 by Rosalva Reena HERO, RN Outcome: Not Progressing 11/20/2024 0523 by Rosalva Reena HERO, RN Outcome: Progressing   Problem: Activity: Goal: Risk for activity intolerance will decrease 11/20/2024 0525 by Rosalva Reena HERO, RN Outcome: Not Progressing 11/20/2024 0523 by Rosalva Reena HERO, RN Outcome: Not Progressing 11/20/2024 0523 by Rosalva Reena HERO, RN Outcome: Progressing

## 2024-11-21 DIAGNOSIS — I10 Essential (primary) hypertension: Secondary | ICD-10-CM

## 2024-11-21 DIAGNOSIS — C61 Malignant neoplasm of prostate: Secondary | ICD-10-CM

## 2024-11-21 DIAGNOSIS — T83511A Infection and inflammatory reaction due to indwelling urethral catheter, initial encounter: Secondary | ICD-10-CM

## 2024-11-21 DIAGNOSIS — N390 Urinary tract infection, site not specified: Secondary | ICD-10-CM

## 2024-11-21 DIAGNOSIS — A499 Bacterial infection, unspecified: Secondary | ICD-10-CM | POA: Diagnosis present

## 2024-11-21 DIAGNOSIS — N138 Other obstructive and reflux uropathy: Secondary | ICD-10-CM

## 2024-11-21 DIAGNOSIS — N179 Acute kidney failure, unspecified: Secondary | ICD-10-CM

## 2024-11-21 DIAGNOSIS — E119 Type 2 diabetes mellitus without complications: Secondary | ICD-10-CM

## 2024-11-21 DIAGNOSIS — N401 Enlarged prostate with lower urinary tract symptoms: Secondary | ICD-10-CM | POA: Diagnosis not present

## 2024-11-21 DIAGNOSIS — R7881 Bacteremia: Secondary | ICD-10-CM

## 2024-11-21 LAB — CBC
HCT: 24.8 % — ABNORMAL LOW (ref 39.0–52.0)
Hemoglobin: 7.6 g/dL — ABNORMAL LOW (ref 13.0–17.0)
MCH: 29.8 pg (ref 26.0–34.0)
MCHC: 30.6 g/dL (ref 30.0–36.0)
MCV: 97.3 fL (ref 80.0–100.0)
Platelets: 235 K/uL (ref 150–400)
RBC: 2.55 MIL/uL — ABNORMAL LOW (ref 4.22–5.81)
RDW: 15.9 % — ABNORMAL HIGH (ref 11.5–15.5)
WBC: 6.2 K/uL (ref 4.0–10.5)
nRBC: 0 % (ref 0.0–0.2)

## 2024-11-21 LAB — BASIC METABOLIC PANEL WITH GFR
Anion gap: 5 (ref 5–15)
BUN: 10 mg/dL (ref 8–23)
CO2: 26 mmol/L (ref 22–32)
Calcium: 8.7 mg/dL — ABNORMAL LOW (ref 8.9–10.3)
Chloride: 111 mmol/L (ref 98–111)
Creatinine, Ser: 0.62 mg/dL (ref 0.61–1.24)
GFR, Estimated: >60 mL/min (ref >60)
Glucose, Bld: 95 mg/dL (ref 70–99)
Potassium: 3.8 mmol/L (ref 3.5–5.1)
Sodium: 141 mmol/L (ref 135–145)

## 2024-11-21 LAB — GLUCOSE, CAPILLARY
Glucose-Capillary: 79 mg/dL (ref 70–99)
Glucose-Capillary: 87 mg/dL (ref 70–99)
Glucose-Capillary: 112 mg/dL — ABNORMAL HIGH (ref 70–99)
Glucose-Capillary: 96 mg/dL (ref 70–99)

## 2024-11-21 MED ORDER — AMOXICILLIN-POT CLAVULANATE 875-125 MG PO TABS
1.0000 | ORAL_TABLET | Freq: Two times a day (BID) | ORAL | Status: DC
  Administered 2024-11-21 – 2024-11-24 (×7): 1 via ORAL
  Filled 2024-11-21 (×7): qty 1

## 2024-11-21 NOTE — Consult Note (Addendum)
 Date of Admission:  11/16/2024          Reason for Consult: Polymicrobial bacteremia    Referring Provider: Toribio Hummer, MD   Assessment:  Polymicrobial bacteremia--likely from his stage IV decubitus ulcer +/- urine though the GP organisms are not in the urine--with sepsis and AKI Dementia Bed bound status Prostate Cancer  DM  Plan:  We have asked the lab to workup the sensis on the coag negative staph species found in the two cultures to make sure these are same organisms and that he truly has gram + and gram negative bacteremia We put him to Augmentin to cover the Proteus and cover anaerobes in the wound, while continuing vancomycin for now but with likely plan to exchange the vancomycin for zyvox to complete a total of two weeks of coverage targeting the staphylococcal species and the Proteus He has already completed sufficient meropenem for the ESBL found in urine Contact precautions for ESBL carrier   Principal Problem:   Sepsis (HCC) Active Problems:   Type II diabetes mellitus, well controlled (HCC)   Hyperlipidemia   Essential hypertension   BPH with obstruction/lower urinary tract symptoms   Malignant neoplasm of prostate (HCC)   Sacral decubitus ulcer, stage IV (HCC)   AKI (acute kidney injury)   Scheduled Meds:  amLODipine   5 mg Oral Daily   amoxicillin-clavulanate  1 tablet Oral Q12H   aspirin  EC  81 mg Oral Daily   Chlorhexidine  Gluconate Cloth  6 each Topical Daily   heparin  5,000 Units Subcutaneous Q8H   insulin  aspart  0-6 Units Subcutaneous TID WC   tamsulosin   0.4 mg Oral Daily   Continuous Infusions:  vancomycin 1,000 mg (11/20/24 1210)   PRN Meds:.acetaminophen  **OR** acetaminophen , acetaminophen , metoprolol tartrate, mouth rinse, polyethylene glycol  HPI: Tanner Griffin is a 83 y.o. male past medical history significant for prostate cancer hypertension Tanner Griffin, hyperlipidemia diabetes mellitus who is fairly bedbound since he  suffered a fall in June and with a large stage IV decubitus ulcer.  He actually had been previously on hospice care.  He was brought into the ER by his wife because she felt he looked unwell and thought his urine appeared cloudy.  He was seen in the ER where blood cultures were taken and urine cultures were taken from his catheter it appears.  He also found to have the stage IV decubitus ulcer mentioned above which was seen by wound care.  He was given fluid resuscitation and was brought started on broad spectrum antibiotics in the form of vancomycin metronidazole and cefepime .  In the interim his blood cultures returned with Cultures positive in the left wrist and right wrist with Staphylococcus capitis Staphylococcus epidermidis and Proteus mirabilis.  Sensitivities have been run on the 1 set of blood cultures we are asking the to be repeated on the opposite set of cultures in particular with regards to the coagulase-negative staphylococcal species that were seen.  Urine cultures yielded 100,000 coliform units of Proteus mirabilis and greater than 100,000 colony-forming units of E. coli that was an ESBL.  His cefepime  was changed to meropenem she has been on the last 3 days, continuing vancomycin.  Source certainly could be his decubitus ulcer given the polymicrobial nature of the infection.  His catheter is also a potential source that we are not seeing the coagulase-negative staph species being present.  We should be able to treat this with oral antibiotics with a total  of 7 days for his gram-negative infection and 2 weeks for the gram-positive 1.  Will go ahead and change him over to Augmentin to cover the Proteus and also anaerobes that could be present in his decubitus ulcer.  Will keep him on vancomycin in the interim and plan on changing him over to oral Zyvox to complete 2 weeks of coverage with augmentin and zyvox.  I personally spent a total of 80 minutes in the care of the  patient today including preparing to see the patient, getting/reviewing separately obtained history, performing a medically appropriate exam/evaluation, counseling and educating, placing orders, referring and communicating with other health care professionals, and documenting clinical information in the EHR.   Evaluation of the patient requires complex antimicrobial therapy evaluation, counseling , isolation needs to reduce disease transmission and risk assessment and mitigation.     Review of Systems: Review of Systems  Unable to perform ROS: Dementia    Past Medical History:  Diagnosis Date   Arthritis    Arthrofibrosis of total knee arthroplasty, right 03/26/2014   Constipation    takes Miralax  daily as needed   Headache    occasional   HYPERLIPIDEMIA    takes Lipitor daily   HYPERTENSION    takes Amlodipine  and Labetalol  daily   Joint pain    Joint swelling    Nocturia    Osteoarthritis of left knee 03/26/2014   Pneumonia    hx of-as a child   Prostate cancer (HCC)    Urinary frequency    takes Flomax  daily   Wears eyeglasses    Wears partial dentures    top     Social History   Tobacco Use   Smoking status: Never   Smokeless tobacco: Never  Vaping Use   Vaping status: Never Used  Substance Use Topics   Alcohol use: No    Alcohol/week: 0.0 standard drinks of alcohol   Drug use: No    Family History  Problem Relation Age of Onset   Brain cancer Father    Renal Disease Brother        ESRD, unknown cause   Stomach cancer Neg Hx    Esophageal cancer Neg Hx    Rectal cancer Neg Hx    Breast cancer Neg Hx    Colon cancer Neg Hx    Pancreatic cancer Neg Hx    Allergies  Allergen Reactions   Telmisartan Other (See Comments)    Reaction:  Headache     OBJECTIVE: Blood pressure (!) 144/86, pulse 84, temperature 97.8 F (36.6 C), temperature source Axillary, resp. rate 18, height 5' 8 (1.727 m), weight 62 kg, SpO2 100%.  Physical Exam HENT:     Head:  Normocephalic and atraumatic.  Eyes:     Conjunctiva/sclera: Conjunctivae normal.  Cardiovascular:     Rate and Rhythm: Normal rate and regular rhythm.  Pulmonary:     Effort: Pulmonary effort is normal. No respiratory distress.     Breath sounds: No wheezing.  Abdominal:     General: There is no distension.     Palpations: Abdomen is soft.  Musculoskeletal:        General: No tenderness. Normal range of motion.     Cervical back: Normal range of motion and neck supple.  Skin:    General: Skin is warm and dry.     Coloration: Skin is not pale.     Findings: No erythema or rash.  Neurological:     Mental Status: He  is alert and oriented to person, place, and time.  Psychiatric:        Mood and Affect: Mood normal.        Behavior: Behavior normal.        Thought Content: Thought content normal.        Cognition and Memory: Cognition is impaired. Memory is impaired. He exhibits impaired recent memory and impaired remote memory.        Judgment: Judgment normal.     Lab Results Lab Results  Component Value Date   WBC 6.2 11/21/2024   HGB 7.6 (L) 11/21/2024   HCT 24.8 (L) 11/21/2024   MCV 97.3 11/21/2024   PLT 235 11/21/2024    Lab Results  Component Value Date   CREATININE 0.62 11/21/2024   BUN 10 11/21/2024   NA 141 11/21/2024   K 3.8 11/21/2024   CL 111 11/21/2024   CO2 26 11/21/2024    Lab Results  Component Value Date   ALT 12 11/17/2024   AST 44 (H) 11/17/2024   ALKPHOS 75 11/17/2024   BILITOT 0.7 11/17/2024     Microbiology: Recent Results (from the past 240 hours)  Resp panel by RT-PCR (RSV, Flu A&B, Covid) Anterior Nasal Swab     Status: None   Collection Time: 11/16/24  4:10 PM   Specimen: Anterior Nasal Swab  Result Value Ref Range Status   SARS Coronavirus 2 by RT PCR NEGATIVE NEGATIVE Final    Comment: (NOTE) SARS-CoV-2 target nucleic acids are NOT DETECTED.  The SARS-CoV-2 RNA is generally detectable in upper respiratory specimens during the  acute phase of infection. The lowest concentration of SARS-CoV-2 viral copies this assay can detect is 138 copies/mL. A negative result does not preclude SARS-Cov-2 infection and should not be used as the sole basis for treatment or other patient management decisions. A negative result may occur with  improper specimen collection/handling, submission of specimen other than nasopharyngeal swab, presence of viral mutation(s) within the areas targeted by this assay, and inadequate number of viral copies(<138 copies/mL). A negative result must be combined with clinical observations, patient history, and epidemiological information. The expected result is Negative.  Fact Sheet for Patients:  bloggercourse.com  Fact Sheet for Healthcare Providers:  seriousbroker.it  This test is no t yet approved or cleared by the [place]  FDA and  has been authorized for detection and/or diagnosis of SARS-CoV-2 by FDA under an Emergency Use Authorization (EUA). This EUA will remain  in effect (meaning this test can be used) for the duration of the COVID-19 declaration under Section 564(b)(1) of the Act, 21 U.S.C.section 360bbb-3(b)(1), unless the authorization is terminated  or revoked sooner.       Influenza A by PCR NEGATIVE NEGATIVE Final   Influenza B by PCR NEGATIVE NEGATIVE Final    Comment: (NOTE) The Xpert Xpress SARS-CoV-2/FLU/RSV plus assay is intended as an aid in the diagnosis of influenza from Nasopharyngeal swab specimens and should not be used as a sole basis for treatment. Nasal washings and aspirates are unacceptable for Xpert Xpress SARS-CoV-2/FLU/RSV testing.  Fact Sheet for Patients: bloggercourse.com  Fact Sheet for Healthcare Providers: seriousbroker.it  This test is not yet approved or cleared by the [place]  FDA and has been authorized for detection and/or  diagnosis of SARS-CoV-2 by FDA under an Emergency Use Authorization (EUA). This EUA will remain in effect (meaning this test can be used) for the duration of the COVID-19 declaration under Section 564(b)(1) of the Act, 21 U.S.C.  section 360bbb-3(b)(1), unless the authorization is terminated or revoked.     Resp Syncytial Virus by PCR NEGATIVE NEGATIVE Final    Comment: (NOTE) Fact Sheet for Patients: bloggercourse.com  Fact Sheet for Healthcare Providers: seriousbroker.it  This test is not yet approved or cleared by the [place]  FDA and has been authorized for detection and/or diagnosis of SARS-CoV-2 by FDA under an Emergency Use Authorization (EUA). This EUA will remain in effect (meaning this test can be used) for the duration of the COVID-19 declaration under Section 564(b)(1) of the Act, 21 U.S.C. section 360bbb-3(b)(1), unless the authorization is terminated or revoked.  Performed at [facility], [address], [place], [place] [address]   Blood Culture (routine x 2)     Status: Abnormal   Collection Time: 11/16/24  4:11 PM   Specimen: BLOOD LEFT WRIST  Result Value Ref Range Status   Specimen Description   Final    BLOOD LEFT WRIST Performed at [facility], [address] [place], [place] [address]    Special Requests   Final    BOTTLES DRAWN AEROBIC AND ANAEROBIC Blood Culture results may not be optimal due to an inadequate volume of blood received in culture bottles Performed at [facility], [address] [place], [place] [address]    Culture  Setup Time   Final    GRAM NEGATIVE RODS ANAEROBIC BOTTLE ONLY CRITICAL RESULT CALLED TO, READ BACK BY AND VERIFIED WITH: PHARMD CHRISTINE SHADE [phone] AT 1120 BY EC GRAM POSITIVE COCCI AEROBIC BOTTLE ONLY CRITICAL VALUE NOTED.  VALUE IS CONSISTENT WITH PREVIOUSLY REPORTED AND CALLED VALUE. Performed at [facility], [address], [place], [place] [address]    Culture (A)  Final    PROTEUS MIRABILIS STAPHYLOCOCCUS EPIDERMIDIS STAPHYLOCOCCUS CAPITIS    Report Status 11/20/2024 FINAL  Final   Organism ID, Bacteria PROTEUS MIRABILIS  Final   Organism ID, Bacteria STAPHYLOCOCCUS EPIDERMIDIS  Final   Organism ID, Bacteria STAPHYLOCOCCUS CAPITIS  Final      Susceptibility   Proteus mirabilis - MIC*    AMPICILLIN <=2 SENSITIVE Sensitive     CEFAZOLIN  (NON-URINE) 4 INTERMEDIATE Intermediate     CEFEPIME  <=0.12 SENSITIVE Sensitive     ERTAPENEM <=0.12 SENSITIVE Sensitive     CEFTRIAXONE  <=0.25 SENSITIVE Sensitive     CIPROFLOXACIN  0.25 SENSITIVE Sensitive     GENTAMICIN <=1 SENSITIVE Sensitive     MEROPENEM 1 SENSITIVE Sensitive     TRIMETH /SULFA  <=20 SENSITIVE Sensitive     AMPICILLIN/SULBACTAM <=2 SENSITIVE Sensitive     PIP/TAZO Value in next row Sensitive      <=4 SENSITIVEThis is a modified FDA-approved test that has been validated and its performance characteristics determined by the reporting laboratory.  This laboratory is certified under the Clinical Laboratory Improvement Amendments CLIA as qualified to perform high complexity clinical laboratory testing.    * PROTEUS MIRABILIS   Staphylococcus capitis - MIC*    CIPROFLOXACIN  Value in next row Sensitive      <=4 SENSITIVEThis is a modified FDA-approved test that has been validated and its performance characteristics determined by the reporting laboratory.  This laboratory is certified under the Clinical Laboratory Improvement Amendments CLIA as qualified to perform high complexity clinical laboratory testing.    ERYTHROMYCIN Value in next row Resistant      <=4 SENSITIVEThis is a modified FDA-approved test that has been validated and its performance characteristics determined by the reporting laboratory.  This laboratory  is certified under the Clinical Laboratory Improvement Amendments CLIA as qualified to perform high complexity  clinical laboratory testing.    GENTAMICIN Value in next row Sensitive      <=4 SENSITIVEThis is a modified FDA-approved test that has been validated and its performance characteristics determined by the reporting laboratory.  This laboratory is certified under the Clinical Laboratory Improvement Amendments CLIA as qualified to perform high complexity clinical laboratory testing.    OXACILLIN Value in next row Sensitive      <=4 SENSITIVEThis is a modified FDA-approved test that has been validated and its performance characteristics determined by the reporting laboratory.  This laboratory is certified under the Clinical Laboratory Improvement Amendments CLIA as qualified to perform high complexity clinical laboratory testing.    TETRACYCLINE Value in next row Sensitive      <=4 SENSITIVEThis is a modified FDA-approved test that has been validated and its performance characteristics determined by the reporting laboratory.  This laboratory is certified under the Clinical Laboratory Improvement Amendments CLIA as qualified to perform high complexity clinical laboratory testing.    VANCOMYCIN Value in next row Sensitive      <=4 SENSITIVEThis is a modified FDA-approved test that has been validated and its performance characteristics determined by the reporting laboratory.  This laboratory is certified under the Clinical Laboratory Improvement Amendments CLIA as qualified to perform high complexity clinical laboratory testing.    TRIMETH /SULFA  Value in next row Sensitive      <=4 SENSITIVEThis is a modified FDA-approved test that has been validated and its performance characteristics determined by the reporting laboratory.  This laboratory is certified under the Clinical Laboratory Improvement Amendments CLIA as qualified to perform high complexity clinical laboratory testing.    CLINDAMYCIN Value in next row Intermediate      <=4 SENSITIVEThis is a modified FDA-approved test that has been validated and its  performance characteristics determined by the reporting laboratory.  This laboratory is certified under the Clinical Laboratory Improvement Amendments CLIA as qualified to perform high complexity clinical laboratory testing.    RIFAMPIN Value in next row Sensitive      <=4 SENSITIVEThis is a modified FDA-approved test that has been validated and its performance characteristics determined by the reporting laboratory.  This laboratory is certified under the Clinical Laboratory Improvement Amendments CLIA as qualified to perform high complexity clinical laboratory testing.    Inducible Clindamycin Value in next row Sensitive      <=4 SENSITIVEThis is a modified FDA-approved test that has been validated and its performance characteristics determined by the reporting laboratory.  This laboratory is certified under the Clinical Laboratory Improvement Amendments CLIA as qualified to perform high complexity clinical laboratory testing.    * STAPHYLOCOCCUS CAPITIS   Staphylococcus epidermidis - MIC*    CIPROFLOXACIN  Value in next row Resistant      <=4 SENSITIVEThis is a modified FDA-approved test that has been validated and its performance characteristics determined by the reporting laboratory.  This laboratory is certified under the Clinical Laboratory Improvement Amendments CLIA as qualified to perform high complexity clinical laboratory testing.    ERYTHROMYCIN Value in next row Resistant      <=4 SENSITIVEThis is a modified FDA-approved test that has been validated and its performance characteristics determined by the reporting laboratory.  This laboratory is certified under the Clinical Laboratory Improvement Amendments CLIA as qualified to perform high complexity clinical laboratory testing.    GENTAMICIN Value in next row Sensitive      <=4 SENSITIVEThis is  a modified FDA-approved test that has been validated and its performance characteristics determined by the reporting laboratory.  This laboratory is  certified under the Clinical Laboratory Improvement Amendments CLIA as qualified to perform high complexity clinical laboratory testing.    OXACILLIN Value in next row Resistant      <=4 SENSITIVEThis is a modified FDA-approved test that has been validated and its performance characteristics determined by the reporting laboratory.  This laboratory is certified under the Clinical Laboratory Improvement Amendments CLIA as qualified to perform high complexity clinical laboratory testing.    TETRACYCLINE Value in next row Sensitive      <=4 SENSITIVEThis is a modified FDA-approved test that has been validated and its performance characteristics determined by the reporting laboratory.  This laboratory is certified under the Clinical Laboratory Improvement Amendments CLIA as qualified to perform high complexity clinical laboratory testing.    VANCOMYCIN Value in next row Sensitive      <=4 SENSITIVEThis is a modified FDA-approved test that has been validated and its performance characteristics determined by the reporting laboratory.  This laboratory is certified under the Clinical Laboratory Improvement Amendments CLIA as qualified to perform high complexity clinical laboratory testing.    TRIMETH /SULFA  Value in next row Sensitive      <=4 SENSITIVEThis is a modified FDA-approved test that has been validated and its performance characteristics determined by the reporting laboratory.  This laboratory is certified under the Clinical Laboratory Improvement Amendments CLIA as qualified to perform high complexity clinical laboratory testing.    CLINDAMYCIN Value in next row Sensitive      <=4 SENSITIVEThis is a modified FDA-approved test that has been validated and its performance characteristics determined by the reporting laboratory.  This laboratory is certified under the Clinical Laboratory Improvement Amendments CLIA as qualified to perform high complexity clinical laboratory testing.    RIFAMPIN Value in next  row Sensitive      <=4 SENSITIVEThis is a modified FDA-approved test that has been validated and its performance characteristics determined by the reporting laboratory.  This laboratory is certified under the Clinical Laboratory Improvement Amendments CLIA as qualified to perform high complexity clinical laboratory testing.    Inducible Clindamycin Value in next row Sensitive      <=4 SENSITIVEThis is a modified FDA-approved test that has been validated and its performance characteristics determined by the reporting laboratory.  This laboratory is certified under the Clinical Laboratory Improvement Amendments CLIA as qualified to perform high complexity clinical laboratory testing.    * STAPHYLOCOCCUS EPIDERMIDIS  Blood Culture ID Panel (Reflexed)     Status: Abnormal   Collection Time: 11/16/24  4:11 PM  Result Value Ref Range Status   Enterococcus faecalis NOT DETECTED NOT DETECTED Final   Enterococcus Faecium NOT DETECTED NOT DETECTED Final   Listeria monocytogenes NOT DETECTED NOT DETECTED Final   Staphylococcus species DETECTED (A) NOT DETECTED Final    Comment: CRITICAL RESULT CALLED TO, READ BACK BY AND VERIFIED WITH: PHARMD CHRISTINE SHADE [id number] AT 1120 BY EC    Staphylococcus aureus (BCID) NOT DETECTED NOT DETECTED Final   Staphylococcus epidermidis DETECTED (A) NOT DETECTED Final    Comment: Methicillin (oxacillin) resistant coagulase negative staphylococcus. Possible blood culture contaminant (unless isolated from more than one blood culture draw or clinical case suggests pathogenicity). No antibiotic treatment is indicated for blood  culture contaminants. CRITICAL RESULT CALLED TO, READ BACK BY AND VERIFIED WITH: PHARMD CHRISTINE SHADE [id number] AT 1120 BY EC    Staphylococcus lugdunensis NOT DETECTED NOT  DETECTED Final   Streptococcus species NOT DETECTED NOT DETECTED Final   Streptococcus agalactiae NOT DETECTED NOT DETECTED Final   Streptococcus pneumoniae NOT DETECTED NOT  DETECTED Final   Streptococcus pyogenes NOT DETECTED NOT DETECTED Final   A.calcoaceticus-baumannii NOT DETECTED NOT DETECTED Final   Bacteroides fragilis NOT DETECTED NOT DETECTED Final   Enterobacterales DETECTED (A) NOT DETECTED Final    Comment: Enterobacterales represent a large order of gram negative bacteria, not a single organism. CRITICAL RESULT CALLED TO, READ BACK BY AND VERIFIED WITH: PHARMD CHRISTINE SHADE [id number] AT 1120 BY EC    Enterobacter cloacae complex NOT DETECTED NOT DETECTED Final   Escherichia coli NOT DETECTED NOT DETECTED Final   Klebsiella aerogenes NOT DETECTED NOT DETECTED Final   Klebsiella oxytoca NOT DETECTED NOT DETECTED Final   Klebsiella pneumoniae NOT DETECTED NOT DETECTED Final   Proteus species DETECTED (A) NOT DETECTED Final    Comment: CRITICAL RESULT CALLED TO, READ BACK BY AND VERIFIED WITH: PHARMD CHRISTINE SHADE [id number] AT 1120 BY EC    Salmonella species NOT DETECTED NOT DETECTED Final   Serratia marcescens NOT DETECTED NOT DETECTED Final   Haemophilus influenzae NOT DETECTED NOT DETECTED Final   Neisseria meningitidis NOT DETECTED NOT DETECTED Final   Pseudomonas aeruginosa NOT DETECTED NOT DETECTED Final   Stenotrophomonas maltophilia NOT DETECTED NOT DETECTED Final   Candida albicans NOT DETECTED NOT DETECTED Final   Candida auris NOT DETECTED NOT DETECTED Final   Candida glabrata NOT DETECTED NOT DETECTED Final   Candida krusei NOT DETECTED NOT DETECTED Final   Candida parapsilosis NOT DETECTED NOT DETECTED Final   Candida tropicalis NOT DETECTED NOT DETECTED Final   Cryptococcus neoformans/gattii NOT DETECTED NOT DETECTED Final   CTX-M ESBL NOT DETECTED NOT DETECTED Final   Carbapenem resistance IMP NOT DETECTED NOT DETECTED Final   Carbapenem resistance KPC NOT DETECTED NOT DETECTED Final   Methicillin resistance mecA/C DETECTED (A) NOT DETECTED Final    Comment: CRITICAL RESULT CALLED TO, READ BACK BY AND VERIFIED WITH: PHARMD  CHRISTINE SHADE [id number] AT 1120 BY EC    Carbapenem resistance NDM NOT DETECTED NOT DETECTED Final   Carbapenem resist OXA 48 LIKE NOT DETECTED NOT DETECTED Final   Carbapenem resistance VIM NOT DETECTED NOT DETECTED Final    Comment: Performed at [facility], [address], [place], [place] [address]  Blood Culture (routine x 2)     Status: Abnormal   Collection Time: 11/16/24  4:16 PM   Specimen: BLOOD RIGHT WRIST  Result Value Ref Range Status   Specimen Description   Final    BLOOD RIGHT WRIST Performed at [facility], [address], [place], [place] [address]    Special Requests   Final    BOTTLES DRAWN AEROBIC AND ANAEROBIC Blood Culture results may not be optimal due to an inadequate volume of blood received in culture bottles Performed at [facility], [address] [place], [place] [address]    Culture  Setup Time   Final    GRAM POSITIVE COCCI AEROBIC BOTTLE ONLY CRITICAL RESULT CALLED TO, READ BACK BY AND VERIFIED WITH: PHARMD CRYSTAL ROBERTSON [id number] AT 1402 BY EC GRAM NEGATIVE RODS ANAEROBIC BOTTLE ONLY CRITICAL VALUE NOTED.  VALUE IS CONSISTENT WITH PREVIOUSLY REPORTED AND CALLED VALUE.    Culture (A)  Final    STAPHYLOCOCCUS CAPITIS PROTEUS MIRABILIS STAPHYLOCOCCUS EPIDERMIDIS SUSCEPTIBILITIES PERFORMED ON PREVIOUS CULTURE WITHIN THE LAST 5 DAYS. Performed at [facility],  [address], [place], [place] [address]    Report Status 11/20/2024 FINAL  Final  Urine Culture     Status: Abnormal   Collection Time: 11/16/24  4:39 PM   Specimen: Urine, Random  Result Value Ref Range Status   Specimen Description   Final    URINE, RANDOM Performed at [facility], [address] [place], [place] [address]    Special Requests   Final    NONE Reflexed from [phone] Performed at [facility], [address], [place], [place] [address]    Culture (A)  Final    >=100,000 COLONIES/mL  PROTEUS MIRABILIS >=100,000 COLONIES/mL ESCHERICHIA COLI Confirmed Extended Spectrum Beta-Lactamase Producer (ESBL).  In bloodstream infections from ESBL organisms, carbapenems are preferred over piperacillin/tazobactam. They are shown to have a lower risk of mortality.    Report Status 11/18/2024 FINAL  Final   Organism ID, Bacteria PROTEUS MIRABILIS (A)  Final   Organism ID, Bacteria ESCHERICHIA COLI (A)  Final      Susceptibility   Escherichia coli - MIC*    AMPICILLIN >=32 RESISTANT Resistant     CEFAZOLIN  (URINE) Value in next row Resistant      >=32 RESISTANTThis is a modified FDA-approved test that has been validated and its performance characteristics determined by the reporting laboratory.  This laboratory is certified under the Clinical Laboratory Improvement Amendments CLIA as qualified to perform high complexity clinical laboratory testing.    CEFEPIME  Value in next row Intermediate      >=32 RESISTANTThis is a modified FDA-approved test that has been validated and its performance characteristics determined by the reporting laboratory.  This laboratory is certified under the Clinical Laboratory Improvement Amendments CLIA as qualified to perform high complexity clinical laboratory testing.    ERTAPENEM Value in next row Sensitive      >=32 RESISTANTThis is a modified FDA-approved test that has been validated and its performance characteristics determined by the reporting laboratory.  This laboratory is certified under the Clinical Laboratory Improvement Amendments CLIA as qualified to perform high complexity clinical laboratory testing.    CEFTRIAXONE  Value in next row Resistant      >=32 RESISTANTThis is a modified FDA-approved test that has been validated and its performance characteristics determined by the reporting laboratory.  This laboratory is certified under the Clinical Laboratory Improvement Amendments CLIA as qualified to perform high complexity clinical laboratory testing.     CIPROFLOXACIN  Value in next row Resistant      >=32 RESISTANTThis is a modified FDA-approved test that has been validated and its performance characteristics determined by the reporting laboratory.  This laboratory is certified under the Clinical Laboratory Improvement Amendments CLIA as qualified to perform high complexity clinical laboratory testing.    GENTAMICIN Value in next row Sensitive      >=32 RESISTANTThis is a modified FDA-approved test that has been validated and its performance characteristics determined by the reporting laboratory.  This laboratory is certified under the Clinical Laboratory Improvement Amendments CLIA as qualified to perform high complexity clinical laboratory testing.    NITROFURANTOIN Value in next row Sensitive      >=32 RESISTANTThis is a modified FDA-approved test that has been validated and its performance characteristics determined by the reporting laboratory.  This laboratory is certified under the Clinical Laboratory Improvement Amendments CLIA as qualified to perform high complexity clinical laboratory testing.    TRIMETH /SULFA  Value in next row Resistant      >=32 RESISTANTThis is a modified FDA-approved  test that has been validated and its performance characteristics determined by the reporting laboratory.  This laboratory is certified under the Clinical Laboratory Improvement Amendments CLIA as qualified to perform high complexity clinical laboratory testing.    AMPICILLIN/SULBACTAM Value in next row Resistant      >=32 RESISTANTThis is a modified FDA-approved test that has been validated and its performance characteristics determined by the reporting laboratory.  This laboratory is certified under the Clinical Laboratory Improvement Amendments CLIA as qualified to perform high complexity clinical laboratory testing.    PIP/TAZO Value in next row Sensitive      <=4 SENSITIVEThis is a modified FDA-approved test that has been validated and its performance  characteristics determined by the reporting laboratory.  This laboratory is certified under the Clinical Laboratory Improvement Amendments CLIA as qualified to perform high complexity clinical laboratory testing.    MEROPENEM  Value in next row Sensitive      <=4 SENSITIVEThis is a modified FDA-approved test that has been validated and its performance characteristics determined by the reporting laboratory.  This laboratory is certified under the Clinical Laboratory Improvement Amendments CLIA as qualified to perform high complexity clinical laboratory testing.    * >=100,000 COLONIES/mL ESCHERICHIA COLI   Proteus mirabilis - MIC*    AMPICILLIN Value in next row Sensitive      <=4 SENSITIVEThis is a modified FDA-approved test that has been validated and its performance characteristics determined by the reporting laboratory.  This laboratory is certified under the Clinical Laboratory Improvement Amendments CLIA as qualified to perform high complexity clinical laboratory testing.    CEFAZOLIN  (URINE) Value in next row Sensitive      4 SENSITIVEThis is a modified FDA-approved test that has been validated and its performance characteristics determined by the reporting laboratory.  This laboratory is certified under the Clinical Laboratory Improvement Amendments CLIA as qualified to perform high complexity clinical laboratory testing.    CEFEPIME  Value in next row Sensitive      4 SENSITIVEThis is a modified FDA-approved test that has been validated and its performance characteristics determined by the reporting laboratory.  This laboratory is certified under the Clinical Laboratory Improvement Amendments CLIA as qualified to perform high complexity clinical laboratory testing.    ERTAPENEM Value in next row Sensitive      4 SENSITIVEThis is a modified FDA-approved test that has been validated and its performance characteristics determined by the reporting laboratory.  This laboratory is certified under the  Clinical Laboratory Improvement Amendments CLIA as qualified to perform high complexity clinical laboratory testing.    CEFTRIAXONE  Value in next row Sensitive      4 SENSITIVEThis is a modified FDA-approved test that has been validated and its performance characteristics determined by the reporting laboratory.  This laboratory is certified under the Clinical Laboratory Improvement Amendments CLIA as qualified to perform high complexity clinical laboratory testing.    CIPROFLOXACIN  Value in next row Sensitive      4 SENSITIVEThis is a modified FDA-approved test that has been validated and its performance characteristics determined by the reporting laboratory.  This laboratory is certified under the Clinical Laboratory Improvement Amendments CLIA as qualified to perform high complexity clinical laboratory testing.    GENTAMICIN Value in next row Sensitive      4 SENSITIVEThis is a modified FDA-approved test that has been validated and its performance characteristics determined by the reporting laboratory.  This laboratory is certified under the Clinical Laboratory Improvement Amendments CLIA as qualified to perform high complexity clinical  laboratory testing.    NITROFURANTOIN Value in next row Resistant      4 SENSITIVEThis is a modified FDA-approved test that has been validated and its performance characteristics determined by the reporting laboratory.  This laboratory is certified under the Clinical Laboratory Improvement Amendments CLIA as qualified to perform high complexity clinical laboratory testing.    TRIMETH /SULFA  Value in next row Sensitive      4 SENSITIVEThis is a modified FDA-approved test that has been validated and its performance characteristics determined by the reporting laboratory.  This laboratory is certified under the Clinical Laboratory Improvement Amendments CLIA as qualified to perform high complexity clinical laboratory testing.    AMPICILLIN/SULBACTAM Value in next row  Sensitive      4 SENSITIVEThis is a modified FDA-approved test that has been validated and its performance characteristics determined by the reporting laboratory.  This laboratory is certified under the Clinical Laboratory Improvement Amendments CLIA as qualified to perform high complexity clinical laboratory testing.    PIP/TAZO Value in next row Sensitive      <=4 SENSITIVEThis is a modified FDA-approved test that has been validated and its performance characteristics determined by the reporting laboratory.  This laboratory is certified under the Clinical Laboratory Improvement Amendments CLIA as qualified to perform high complexity clinical laboratory testing.    MEROPENEM  Value in next row Sensitive      <=4 SENSITIVEThis is a modified FDA-approved test that has been validated and its performance characteristics determined by the reporting laboratory.  This laboratory is certified under the Clinical Laboratory Improvement Amendments CLIA as qualified to perform high complexity clinical laboratory testing.    * >=100,000 COLONIES/mL PROTEUS MIRABILIS  Culture, blood (Routine X 2) w Reflex to ID Panel     Status: None (Preliminary result)   Collection Time: 11/20/24  9:13 AM   Specimen: BLOOD LEFT ARM  Result Value Ref Range Status   Specimen Description   Final    BLOOD LEFT ARM Performed at [facility], [address], [place], [place] [address]    Special Requests   Final    BOTTLES DRAWN AEROBIC ONLY Blood Culture results may not be optimal due to an inadequate volume of blood received in culture bottles Performed at [facility], [address], [place], [place] [address]    Culture   Final    NO GROWTH < 24 HOURS Performed at [facility], [address], [place], [place] [address]    Report Status PENDING  Incomplete  Culture, blood (Routine X 2) w Reflex to ID Panel     Status: None (Preliminary result)   Collection Time: 11/20/24  9:13 AM    Specimen: BLOOD LEFT HAND  Result Value Ref Range Status   Specimen Description   Final    BLOOD LEFT HAND Performed at [facility], [address], [place], [place] [address]    Special Requests   Final    BOTTLES DRAWN AEROBIC ONLY Blood Culture results may not be optimal due to an inadequate volume of blood received in culture bottles Performed at [facility], [address], [place], [place] [address]    Culture   Final    NO GROWTH < 24 HOURS Performed at [facility], [address], [place], [place] [address]    Report Status PENDING  Incomplete    Jomarie Fleeta Rothman, MD [facility] for Infectious Disease [facility] [phone] pager  11/21/2024, 9:37 AM

## 2024-11-21 NOTE — Progress Notes (Addendum)
 PROGRESS NOTE    Tanner Griffin  FMW:[id number] DOB: [birth year] DOA: 11/16/2024 PCP: Haze Kingfisher, MD   Chief Complaint  Patient presents with   Hypotension    Brief Narrative:  This is a 83 year old male who lives at home with his family had a fall in June since then he has been bedridden and has not walked much has a history of prostate cancer hypertension has a chronic Foley. Apparently the Foley catheter was changed by hospice last week per family. Patient is not able to provide me any history. He was admitted with concern for urinary tract infection and dehydration. He was found to have an elevated lactate level started on Vanco cefepime  and Flagyl. He also has a stage IV sacral decubitus. He is seen by wound care. Blood culture grew Staph epidermidis, Proteus, gram-negative Enterobacter, methicillin resistance detected    Assessment & Plan:   Principal Problem:   Sepsis (HCC) Active Problems:   Type II diabetes mellitus, well controlled (HCC)   Hyperlipidemia   Essential hypertension   BPH with obstruction/lower urinary tract symptoms   Malignant neoplasm of prostate (HCC)   Sacral decubitus ulcer, stage IV (HCC)   AKI (acute kidney injury)   Bacteremia   Urinary tract infection associated with indwelling urethral catheter  #1 sepsis secondary to polymicrobial bacteremia (Proteus and Staph epidermidis bacteremia) and ESBL E. Coli UTI, POA -Felt likely seeded from stage IV decubitus ulcer +/- urine - Patient with history of prostate cancer with chronic Foley catheter in place noted to be bedridden. - At time of admission patient noted to be hypotensive, elevated lactic acid level, leukocytosis and initially started on IV vancomycin and IV cefepime , IV Flagyl. - Once cultures had resulted IV vancomycin was changed to meropenem. - 2/2 blood cultures with Staph epidermidis methicillin resistance detected, Proteus, Staphylococcus capitis. -Urine cultures with > 100,000  colonies of Proteus Mirabilis, > 100,000 colonies of ESBL E. coli - Patient seen in consultation by ID who reviewed cultures and sensitivities and recommending placing patient on Augmentin to cover Proteus and anaerobes in the wound, while continuing vancomycin for now but likely plan to exchange vancomycin for Zyvox to complete a total of 2 weeks of coverage targeting Staphylococcus species and Proteus. -Per ID patient already completed sufficient meropenem for ESBL found in the urine. -Appreciate ID input and recommendations.  2.  Stage IV decubitus ulcer - Patient seen by wound care. - See wound care recommendations.  3.  History of prostate cancer/BPH - Patient noted to be on hospice prior to admission. - Hospice liaison discussed with family and patient to return back home with hospice following on discharge.  4.  Hypertension -Patient noted to be on Norvasc  and losartan  prior to admission which were held due to soft blood pressure. - BP currently stable. - Norvasc  resumed at 5 mg daily.  Follow.  5.  Hyperlipidemia -Statin.  6.  Diabetes mellitus type 2 -Hemoglobin A1c noted at 5.1 on 11/26/2021. - CBG 79 this morning. - SSI.  7.  Hypokalemia -Repleted.  8.  Normocytic anemia/anemia of chronic disease - Hemoglobin currently at 7.6 today from 7.5 from 7.7 from 7.6 from 7.9 from 11.2 on admission. - Felt likely dilutional component. - Patient with no overt GI bleed. - FOBT pending. - Follow H&H.  9.  AKI -Improved with hydration.  10.  Pressure injury, POA Wound Pressure Injury Elbow Posterior;Left Stage 3 -  Full thickness tissue loss. Subcutaneous fat may be visible but bone, tendon  or muscle are NOT exposed. (Active)       DVT prophylaxis: Heparin Code Status: Full Family Communication: Updated patient.  Updated daughter, [name] on the telephone.  Disposition: Back home with hospice once medically stable hopefully in the next 24 to 48 hours.  Status is:  Inpatient Remains inpatient appropriate because: Severity of illness   Consultants:  ID: Dr.Van Dam 11/21/2024  Procedures:  Chest x-ray 11/16/2024   Antimicrobials:  Anti-infectives (From admission, onward)    Start     Dose/Rate Route Frequency Ordered Stop   11/21/24 1000  amoxicillin-clavulanate (AUGMENTIN) 875-125 MG per tablet 1 tablet        1 tablet Oral Every 12 hours 11/21/24 0907     11/18/24 1600  vancomycin (VANCOCIN) IVPB 1000 mg/200 mL premix  Status:  Discontinued        1,000 mg 200 mL/hr over 60 Minutes Intravenous Every 48 hours 11/17/24 1407 11/18/24 0922   11/18/24 1400  meropenem (MERREM) 1 g in sodium chloride  0.9 % 100 mL IVPB  Status:  Discontinued        1 g 200 mL/hr over 30 Minutes Intravenous Every 8 hours 11/18/24 0922 11/21/24 0907   11/18/24 1200  vancomycin (VANCOCIN) IVPB 1000 mg/200 mL premix        1,000 mg 200 mL/hr over 60 Minutes Intravenous Every 24 hours 11/18/24 0922     11/17/24 1600  vancomycin (VANCOCIN) IVPB 1000 mg/200 mL premix  Status:  Discontinued        1,000 mg 200 mL/hr over 60 Minutes Intravenous Every 48 hours 11/16/24 2130 11/17/24 1227   11/17/24 1600  vancomycin (VANCOCIN) IVPB 1000 mg/200 mL premix  Status:  Discontinued        1,000 mg 200 mL/hr over 60 Minutes Intravenous Every 48 hours 11/17/24 1403 11/17/24 1407   11/17/24 1400  ceFEPIme  (MAXIPIME ) 2 g in sodium chloride  0.9 % 100 mL IVPB  Status:  Discontinued        2 g 200 mL/hr over 30 Minutes Intravenous Daily 11/16/24 2052 11/17/24 1227   11/17/24 1315  cefTRIAXone  (ROCEPHIN ) 2 g in sodium chloride  0.9 % 100 mL IVPB  Status:  Discontinued        2 g 200 mL/hr over 30 Minutes Intravenous Every 24 hours 11/17/24 1227 11/18/24 0922   11/17/24 0600  metroNIDAZOLE (FLAGYL) IVPB 500 mg  Status:  Discontinued        500 mg 100 mL/hr over 60 Minutes Intravenous 2 times daily 11/16/24 2052 11/17/24 1227   11/16/24 2145  vancomycin (VANCOCIN) IVPB 1000 mg/200 mL premix   Status:  Discontinued        1,000 mg 200 mL/hr over 60 Minutes Intravenous Every 24 hours 11/16/24 2052 11/16/24 2130   11/16/24 1530  ceFEPIme  (MAXIPIME ) 2 g in sodium chloride  0.9 % 100 mL IVPB        2 g 200 mL/hr over 30 Minutes Intravenous  Once 11/16/24 1523 11/16/24 1721   11/16/24 1530  metroNIDAZOLE (FLAGYL) IVPB 500 mg        500 mg 100 mL/hr over 60 Minutes Intravenous  Once 11/16/24 1523 11/16/24 1735   11/16/24 1530  vancomycin (VANCOCIN) IVPB 1000 mg/200 mL premix        1,000 mg 200 mL/hr over 60 Minutes Intravenous  Once 11/16/24 1523 11/16/24 1822         Subjective: Patient laying in bed.  Arousable.  Denies any chest pain or shortness of breath.  No abdominal pain.  Objective: Vitals:   11/20/24 1416 11/20/24 2023 11/21/24 0947 11/21/24 1245  BP: (!) 139/93 (!) 144/86 (!) 148/71 131/77  Pulse: 76 84  70  Resp: 18 18  18   Temp: 98.2 F (36.8 C) 97.8 F (36.6 C)  99 F (37.2 C)  TempSrc: Oral Axillary  Oral  SpO2: 100% 100%    Weight:      Height:        Intake/Output Summary (Last 24 hours) at 11/21/2024 1443 Last data filed at 11/21/2024 1325 Gross per 24 hour  Intake 574 ml  Output 850 ml  Net -276 ml   Filed Weights   11/16/24 1515  Weight: 62 kg    Examination:  General exam: NAD. Respiratory system: Griffin to auscultation anterior lung fields. Respiratory effort normal. Cardiovascular system: S1 & S2 heard, RRR. No JVD, murmurs, rubs, gallops or clicks. No pedal edema. Gastrointestinal system: Abdomen is nondistended, soft and nontender. No organomegaly or masses felt. Normal bowel sounds heard. Central nervous system: Alert and oriented. No focal neurological deficits. Extremities: Symmetric 5 x 5 power. Skin: No rashes, lesions or ulcers Psychiatry: Judgement and insight appear normal. Mood & affect appropriate.     Data Reviewed: I have personally reviewed following labs and imaging studies  CBC: Recent Labs  Lab 11/16/24 1629  11/16/24 1827 11/17/24 1325 11/18/24 0540 11/19/24 0450 11/20/24 0534 11/21/24 0459  WBC 22.9*  --  22.2* 14.6* 13.0* 7.4 6.2  NEUTROABS 20.9*  --   --   --   --   --   --   HGB 9.7*   < > 7.9* 7.6* 7.7* 7.5* 7.6*  HCT 30.9*   < > 24.7* 23.9* 24.5* 25.0* 24.8*  MCV 96.6  --  96.9 96.0 96.1 99.2 97.3  PLT 273  --  214 217 222 221 235   < > = values in this interval not displayed.    Basic Metabolic Panel: Recent Labs  Lab 11/17/24 1325 11/18/24 0540 11/19/24 0450 11/20/24 0534 11/20/24 0837 11/21/24 0459  NA 136 138 141 141  --  141  K 3.1* 2.8* 4.0 4.0  --  3.8  CL 107 107 111 112*  --  111  CO2 23 24 24 26   --  26  GLUCOSE 107* 93 80 82  --  95  BUN 27* 26* 18 12  --  10  CREATININE 1.15 0.87 0.72 0.53*  --  0.62  CALCIUM  8.2* 8.1* 8.4* 8.8*  --  8.7*  MG 1.7 1.5*  --   --  1.8  --     GFR: Estimated Creatinine Clearance: 63.5 mL/min (by C-G formula based on SCr of 0.62 mg/dL).  Liver Function Tests: Recent Labs  Lab 11/16/24 1820 11/17/24 1325  AST 41 44*  ALT 9 12  ALKPHOS 88 75  BILITOT 1.3* 0.7  PROT 6.4* 5.7*  ALBUMIN 2.5* 2.2*    CBG: Recent Labs  Lab 11/20/24 1149 11/20/24 1711 11/20/24 2058 11/21/24 0717 11/21/24 1218  GLUCAP 104* 106* 119* 79 87     Recent Results (from the past 240 hours)  Resp panel by RT-PCR (RSV, Flu A&B, Covid) Anterior Nasal Swab     Status: None   Collection Time: 11/16/24  4:10 PM   Specimen: Anterior Nasal Swab  Result Value Ref Range Status   SARS Coronavirus 2 by RT PCR NEGATIVE NEGATIVE Final    Comment: (NOTE) SARS-CoV-2 target nucleic acids are NOT DETECTED.  The  SARS-CoV-2 RNA is generally detectable in upper respiratory specimens during the acute phase of infection. The lowest concentration of SARS-CoV-2 viral copies this assay can detect is 138 copies/mL. A negative result does not preclude SARS-Cov-2 infection and should not be used as the sole basis for treatment or other patient management  decisions. A negative result may occur with  improper specimen collection/handling, submission of specimen other than nasopharyngeal swab, presence of viral mutation(s) within the areas targeted by this assay, and inadequate number of viral copies(<138 copies/mL). A negative result must be combined with clinical observations, patient history, and epidemiological information. The expected result is Negative.  Fact Sheet for Patients:  bloggercourse.com  Fact Sheet for Healthcare Providers:  seriousbroker.it  This test is no t yet approved or cleared by the [place]  FDA and  has been authorized for detection and/or diagnosis of SARS-CoV-2 by FDA under an Emergency Use Authorization (EUA). This EUA will remain  in effect (meaning this test can be used) for the duration of the COVID-19 declaration under Section 564(b)(1) of the Act, 21 U.S.C.section 360bbb-3(b)(1), unless the authorization is terminated  or revoked sooner.       Influenza A by PCR NEGATIVE NEGATIVE Final   Influenza B by PCR NEGATIVE NEGATIVE Final    Comment: (NOTE) The Xpert Xpress SARS-CoV-2/FLU/RSV plus assay is intended as an aid in the diagnosis of influenza from Nasopharyngeal swab specimens and should not be used as a sole basis for treatment. Nasal washings and aspirates are unacceptable for Xpert Xpress SARS-CoV-2/FLU/RSV testing.  Fact Sheet for Patients: bloggercourse.com  Fact Sheet for Healthcare Providers: seriousbroker.it  This test is not yet approved or cleared by the [place]  FDA and has been authorized for detection and/or diagnosis of SARS-CoV-2 by FDA under an Emergency Use Authorization (EUA). This EUA will remain in effect (meaning this test can be used) for the duration of the COVID-19 declaration under Section 564(b)(1) of the Act, 21 U.S.C. section 360bbb-3(b)(1), unless the  authorization is terminated or revoked.     Resp Syncytial Virus by PCR NEGATIVE NEGATIVE Final    Comment: (NOTE) Fact Sheet for Patients: bloggercourse.com  Fact Sheet for Healthcare Providers: seriousbroker.it  This test is not yet approved or cleared by the [place]  FDA and has been authorized for detection and/or diagnosis of SARS-CoV-2 by FDA under an Emergency Use Authorization (EUA). This EUA will remain in effect (meaning this test can be used) for the duration of the COVID-19 declaration under Section 564(b)(1) of the Act, 21 U.S.C. section 360bbb-3(b)(1), unless the authorization is terminated or revoked.  Performed at [facility], [address], [place], [place] [address]   Blood Culture (routine x 2)     Status: Abnormal   Collection Time: 11/16/24  4:11 PM   Specimen: BLOOD LEFT WRIST  Result Value Ref Range Status   Specimen Description   Final    BLOOD LEFT WRIST Performed at [facility], [address], [place], [place] [address]    Special Requests   Final    BOTTLES DRAWN AEROBIC AND ANAEROBIC Blood Culture results may not be optimal due to an inadequate volume of blood received in culture bottles Performed at [facility], [address], [place], [place] [address]    Culture  Setup Time   Final    GRAM NEGATIVE RODS ANAEROBIC BOTTLE ONLY CRITICAL RESULT CALLED TO, READ BACK BY AND VERIFIED WITH: PHARMD CHRISTINE SHADE [phone] AT 1120 BY EC  GRAM POSITIVE COCCI AEROBIC BOTTLE ONLY CRITICAL VALUE NOTED.  VALUE IS CONSISTENT WITH PREVIOUSLY REPORTED AND CALLED VALUE. Performed at [facility], [address], [place], [place] [address]    Culture (A)  Final    PROTEUS MIRABILIS STAPHYLOCOCCUS EPIDERMIDIS STAPHYLOCOCCUS CAPITIS    Report Status 11/20/2024 FINAL  Final   Organism ID, Bacteria PROTEUS MIRABILIS  Final   Organism ID, Bacteria  STAPHYLOCOCCUS EPIDERMIDIS  Final   Organism ID, Bacteria STAPHYLOCOCCUS CAPITIS  Final      Susceptibility   Proteus mirabilis - MIC*    AMPICILLIN <=2 SENSITIVE Sensitive     CEFAZOLIN  (NON-URINE) 4 INTERMEDIATE Intermediate     CEFEPIME  <=0.12 SENSITIVE Sensitive     ERTAPENEM <=0.12 SENSITIVE Sensitive     CEFTRIAXONE  <=0.25 SENSITIVE Sensitive     CIPROFLOXACIN  0.25 SENSITIVE Sensitive     GENTAMICIN <=1 SENSITIVE Sensitive     MEROPENEM 1 SENSITIVE Sensitive     TRIMETH /SULFA  <=20 SENSITIVE Sensitive     AMPICILLIN/SULBACTAM <=2 SENSITIVE Sensitive     PIP/TAZO Value in next row Sensitive      <=4 SENSITIVEThis is a modified FDA-approved test that has been validated and its performance characteristics determined by the reporting laboratory.  This laboratory is certified under the Clinical Laboratory Improvement Amendments CLIA as qualified to perform high complexity clinical laboratory testing.    * PROTEUS MIRABILIS   Staphylococcus capitis - MIC*    CIPROFLOXACIN  Value in next row Sensitive      <=4 SENSITIVEThis is a modified FDA-approved test that has been validated and its performance characteristics determined by the reporting laboratory.  This laboratory is certified under the Clinical Laboratory Improvement Amendments CLIA as qualified to perform high complexity clinical laboratory testing.    ERYTHROMYCIN Value in next row Resistant      <=4 SENSITIVEThis is a modified FDA-approved test that has been validated and its performance characteristics determined by the reporting laboratory.  This laboratory is certified under the Clinical Laboratory Improvement Amendments CLIA as qualified to perform high complexity clinical laboratory testing.    GENTAMICIN Value in next row Sensitive      <=4 SENSITIVEThis is a modified FDA-approved test that has been validated and its performance characteristics determined by the reporting laboratory.  This laboratory is certified under the  Clinical Laboratory Improvement Amendments CLIA as qualified to perform high complexity clinical laboratory testing.    OXACILLIN Value in next row Sensitive      <=4 SENSITIVEThis is a modified FDA-approved test that has been validated and its performance characteristics determined by the reporting laboratory.  This laboratory is certified under the Clinical Laboratory Improvement Amendments CLIA as qualified to perform high complexity clinical laboratory testing.    TETRACYCLINE Value in next row Sensitive      <=4 SENSITIVEThis is a modified FDA-approved test that has been validated and its performance characteristics determined by the reporting laboratory.  This laboratory is certified under the Clinical Laboratory Improvement Amendments CLIA as qualified to perform high complexity clinical laboratory testing.    VANCOMYCIN Value in next row Sensitive      <=4 SENSITIVEThis is a modified FDA-approved test that has been validated and its performance characteristics determined by the reporting laboratory.  This laboratory is certified under the Clinical Laboratory Improvement Amendments CLIA as qualified to perform high complexity clinical laboratory testing.    TRIMETH /SULFA  Value in next row Sensitive      <=4 SENSITIVEThis is a modified FDA-approved test that has been  validated and its performance characteristics determined by the reporting laboratory.  This laboratory is certified under the Clinical Laboratory Improvement Amendments CLIA as qualified to perform high complexity clinical laboratory testing.    CLINDAMYCIN Value in next row Intermediate      <=4 SENSITIVEThis is a modified FDA-approved test that has been validated and its performance characteristics determined by the reporting laboratory.  This laboratory is certified under the Clinical Laboratory Improvement Amendments CLIA as qualified to perform high complexity clinical laboratory testing.    RIFAMPIN Value in next row Sensitive       <=4 SENSITIVEThis is a modified FDA-approved test that has been validated and its performance characteristics determined by the reporting laboratory.  This laboratory is certified under the Clinical Laboratory Improvement Amendments CLIA as qualified to perform high complexity clinical laboratory testing.    Inducible Clindamycin Value in next row Sensitive      <=4 SENSITIVEThis is a modified FDA-approved test that has been validated and its performance characteristics determined by the reporting laboratory.  This laboratory is certified under the Clinical Laboratory Improvement Amendments CLIA as qualified to perform high complexity clinical laboratory testing.    * STAPHYLOCOCCUS CAPITIS   Staphylococcus epidermidis - MIC*    CIPROFLOXACIN  Value in next row Resistant      <=4 SENSITIVEThis is a modified FDA-approved test that has been validated and its performance characteristics determined by the reporting laboratory.  This laboratory is certified under the Clinical Laboratory Improvement Amendments CLIA as qualified to perform high complexity clinical laboratory testing.    ERYTHROMYCIN Value in next row Resistant      <=4 SENSITIVEThis is a modified FDA-approved test that has been validated and its performance characteristics determined by the reporting laboratory.  This laboratory is certified under the Clinical Laboratory Improvement Amendments CLIA as qualified to perform high complexity clinical laboratory testing.    GENTAMICIN Value in next row Sensitive      <=4 SENSITIVEThis is a modified FDA-approved test that has been validated and its performance characteristics determined by the reporting laboratory.  This laboratory is certified under the Clinical Laboratory Improvement Amendments CLIA as qualified to perform high complexity clinical laboratory testing.    OXACILLIN Value in next row Resistant      <=4 SENSITIVEThis is a modified FDA-approved test that has been validated and its  performance characteristics determined by the reporting laboratory.  This laboratory is certified under the Clinical Laboratory Improvement Amendments CLIA as qualified to perform high complexity clinical laboratory testing.    TETRACYCLINE Value in next row Sensitive      <=4 SENSITIVEThis is a modified FDA-approved test that has been validated and its performance characteristics determined by the reporting laboratory.  This laboratory is certified under the Clinical Laboratory Improvement Amendments CLIA as qualified to perform high complexity clinical laboratory testing.    VANCOMYCIN  Value in next row Sensitive      <=4 SENSITIVEThis is a modified FDA-approved test that has been validated and its performance characteristics determined by the reporting laboratory.  This laboratory is certified under the Clinical Laboratory Improvement Amendments CLIA as qualified to perform high complexity clinical laboratory testing.    TRIMETH /SULFA  Value in next row Sensitive      <=4 SENSITIVEThis is a modified FDA-approved test that has been validated and its performance characteristics determined by the reporting laboratory.  This laboratory is certified under the Clinical Laboratory Improvement Amendments CLIA as qualified to perform high complexity clinical laboratory testing.    CLINDAMYCIN  Value in next row Sensitive      <=4 SENSITIVEThis is a modified FDA-approved test that has been validated and its performance characteristics determined by the reporting laboratory.  This laboratory is certified under the Clinical Laboratory Improvement Amendments CLIA as qualified to perform high complexity clinical laboratory testing.    RIFAMPIN Value in next row Sensitive      <=4 SENSITIVEThis is a modified FDA-approved test that has been validated and its performance characteristics determined by the reporting laboratory.  This laboratory is certified under the Clinical Laboratory Improvement Amendments CLIA as  qualified to perform high complexity clinical laboratory testing.    Inducible Clindamycin Value in next row Sensitive      <=4 SENSITIVEThis is a modified FDA-approved test that has been validated and its performance characteristics determined by the reporting laboratory.  This laboratory is certified under the Clinical Laboratory Improvement Amendments CLIA as qualified to perform high complexity clinical laboratory testing.    * STAPHYLOCOCCUS EPIDERMIDIS  Blood Culture ID Panel (Reflexed)     Status: Abnormal   Collection Time: 11/16/24  4:11 PM  Result Value Ref Range Status   Enterococcus faecalis NOT DETECTED NOT DETECTED Final   Enterococcus Faecium NOT DETECTED NOT DETECTED Final   Listeria monocytogenes NOT DETECTED NOT DETECTED Final   Staphylococcus species DETECTED (A) NOT DETECTED Final    Comment: CRITICAL RESULT CALLED TO, READ BACK BY AND VERIFIED WITH: PHARMD CHRISTINE SHADE [id number] AT 1120 BY EC    Staphylococcus aureus (BCID) NOT DETECTED NOT DETECTED Final   Staphylococcus epidermidis DETECTED (A) NOT DETECTED Final    Comment: Methicillin (oxacillin) resistant coagulase negative staphylococcus. Possible blood culture contaminant (unless isolated from more than one blood culture draw or clinical case suggests pathogenicity). No antibiotic treatment is indicated for blood  culture contaminants. CRITICAL RESULT CALLED TO, READ BACK BY AND VERIFIED WITH: PHARMD CHRISTINE SHADE [id number] AT 1120 BY EC    Staphylococcus lugdunensis NOT DETECTED NOT DETECTED Final   Streptococcus species NOT DETECTED NOT DETECTED Final   Streptococcus agalactiae NOT DETECTED NOT DETECTED Final   Streptococcus pneumoniae NOT DETECTED NOT DETECTED Final   Streptococcus pyogenes NOT DETECTED NOT DETECTED Final   A.calcoaceticus-baumannii NOT DETECTED NOT DETECTED Final   Bacteroides fragilis NOT DETECTED NOT DETECTED Final   Enterobacterales DETECTED (A) NOT DETECTED Final    Comment:  Enterobacterales represent a large order of gram negative bacteria, not a single organism. CRITICAL RESULT CALLED TO, READ BACK BY AND VERIFIED WITH: PHARMD CHRISTINE SHADE [id number] AT 1120 BY EC    Enterobacter cloacae complex NOT DETECTED NOT DETECTED Final   Escherichia coli NOT DETECTED NOT DETECTED Final   Klebsiella aerogenes NOT DETECTED NOT DETECTED Final   Klebsiella oxytoca NOT DETECTED NOT DETECTED Final   Klebsiella pneumoniae NOT DETECTED NOT DETECTED Final   Proteus species DETECTED (A) NOT DETECTED Final    Comment: CRITICAL RESULT CALLED TO, READ BACK BY AND VERIFIED WITH: PHARMD CHRISTINE SHADE [id number] AT 1120 BY EC    Salmonella species NOT DETECTED NOT DETECTED Final   Serratia marcescens NOT DETECTED NOT DETECTED Final   Haemophilus influenzae NOT DETECTED NOT DETECTED Final   Neisseria meningitidis NOT DETECTED NOT DETECTED Final   Pseudomonas aeruginosa NOT DETECTED NOT DETECTED Final   Stenotrophomonas maltophilia NOT DETECTED NOT DETECTED Final   Candida albicans NOT DETECTED NOT DETECTED Final   Candida auris NOT DETECTED NOT DETECTED Final   Candida glabrata NOT DETECTED NOT DETECTED Final   Candida krusei  NOT DETECTED NOT DETECTED Final   Candida parapsilosis NOT DETECTED NOT DETECTED Final   Candida tropicalis NOT DETECTED NOT DETECTED Final   Cryptococcus neoformans/gattii NOT DETECTED NOT DETECTED Final   CTX-M ESBL NOT DETECTED NOT DETECTED Final   Carbapenem resistance IMP NOT DETECTED NOT DETECTED Final   Carbapenem resistance KPC NOT DETECTED NOT DETECTED Final   Methicillin resistance mecA/C DETECTED (A) NOT DETECTED Final    Comment: CRITICAL RESULT CALLED TO, READ BACK BY AND VERIFIED WITH: PHARMD CHRISTINE SHADE [id number] AT 1120 BY EC    Carbapenem resistance NDM NOT DETECTED NOT DETECTED Final   Carbapenem resist OXA 48 LIKE NOT DETECTED NOT DETECTED Final   Carbapenem resistance VIM NOT DETECTED NOT DETECTED Final    Comment: Performed at  [facility], [address], [place], [place] [address]  Blood Culture (routine x 2)     Status: Abnormal (Preliminary result)   Collection Time: 11/16/24  4:16 PM   Specimen: BLOOD RIGHT WRIST  Result Value Ref Range Status   Specimen Description   Final    BLOOD RIGHT WRIST Performed at [facility], [address], [place], [place] [address]    Special Requests   Final    BOTTLES DRAWN AEROBIC AND ANAEROBIC Blood Culture results may not be optimal due to an inadequate volume of blood received in culture bottles Performed at [facility], [address], [place], [place] [address]    Culture  Setup Time   Final    GRAM POSITIVE COCCI AEROBIC BOTTLE ONLY CRITICAL RESULT CALLED TO, READ BACK BY AND VERIFIED WITH: PHARMD CRYSTAL ROBERTSON [id number] AT 1402 BY EC GRAM NEGATIVE RODS ANAEROBIC BOTTLE ONLY CRITICAL VALUE NOTED.  VALUE IS CONSISTENT WITH PREVIOUSLY REPORTED AND CALLED VALUE.    Culture (A)  Final    STAPHYLOCOCCUS CAPITIS PROTEUS MIRABILIS STAPHYLOCOCCUS EPIDERMIDIS CULTURE REINCUBATED FOR BETTER GROWTH Performed at [facility], [address] [place], [place] [address]    Report Status PENDING  Incomplete  Urine Culture     Status: Abnormal   Collection Time: 11/16/24  4:39 PM   Specimen: Urine, Random  Result Value Ref Range Status   Specimen Description   Final    URINE, RANDOM Performed at [facility], [address] [place], [place] [address]    Special Requests   Final    NONE Reflexed from [id number] Performed at [facility], [address], [place], [place] [address]    Culture (A)  Final    >=100,000 COLONIES/mL PROTEUS MIRABILIS >=100,000 COLONIES/mL ESCHERICHIA COLI Confirmed Extended Spectrum Beta-Lactamase Producer (ESBL).  In bloodstream infections from ESBL organisms, carbapenems are preferred over piperacillin/tazobactam. They are shown to have a lower risk of mortality.     Report Status 11/18/2024 FINAL  Final   Organism ID, Bacteria PROTEUS MIRABILIS (A)  Final   Organism ID, Bacteria ESCHERICHIA COLI (A)  Final      Susceptibility   Escherichia coli - MIC*    AMPICILLIN >=32 RESISTANT Resistant     CEFAZOLIN  (URINE) Value in next row Resistant      >=32 RESISTANTThis is a modified FDA-approved test that has been validated and its performance characteristics determined by the reporting laboratory.  This laboratory is certified under the Clinical Laboratory Improvement Amendments CLIA as qualified to perform high complexity clinical laboratory testing.    CEFEPIME  Value in next row Intermediate      >=32 RESISTANTThis is a  modified FDA-approved test that has been validated and its performance characteristics determined by the reporting laboratory.  This laboratory is certified under the Clinical Laboratory Improvement Amendments CLIA as qualified to perform high complexity clinical laboratory testing.    ERTAPENEM Value in next row Sensitive      >=32 RESISTANTThis is a modified FDA-approved test that has been validated and its performance characteristics determined by the reporting laboratory.  This laboratory is certified under the Clinical Laboratory Improvement Amendments CLIA as qualified to perform high complexity clinical laboratory testing.    CEFTRIAXONE  Value in next row Resistant      >=32 RESISTANTThis is a modified FDA-approved test that has been validated and its performance characteristics determined by the reporting laboratory.  This laboratory is certified under the Clinical Laboratory Improvement Amendments CLIA as qualified to perform high complexity clinical laboratory testing.    CIPROFLOXACIN  Value in next row Resistant      >=32 RESISTANTThis is a modified FDA-approved test that has been validated and its performance characteristics determined by the reporting laboratory.  This laboratory is certified under the Clinical Laboratory Improvement  Amendments CLIA as qualified to perform high complexity clinical laboratory testing.    GENTAMICIN Value in next row Sensitive      >=32 RESISTANTThis is a modified FDA-approved test that has been validated and its performance characteristics determined by the reporting laboratory.  This laboratory is certified under the Clinical Laboratory Improvement Amendments CLIA as qualified to perform high complexity clinical laboratory testing.    NITROFURANTOIN Value in next row Sensitive      >=32 RESISTANTThis is a modified FDA-approved test that has been validated and its performance characteristics determined by the reporting laboratory.  This laboratory is certified under the Clinical Laboratory Improvement Amendments CLIA as qualified to perform high complexity clinical laboratory testing.    TRIMETH /SULFA  Value in next row Resistant      >=32 RESISTANTThis is a modified FDA-approved test that has been validated and its performance characteristics determined by the reporting laboratory.  This laboratory is certified under the Clinical Laboratory Improvement Amendments CLIA as qualified to perform high complexity clinical laboratory testing.    AMPICILLIN/SULBACTAM Value in next row Resistant      >=32 RESISTANTThis is a modified FDA-approved test that has been validated and its performance characteristics determined by the reporting laboratory.  This laboratory is certified under the Clinical Laboratory Improvement Amendments CLIA as qualified to perform high complexity clinical laboratory testing.    PIP/TAZO Value in next row Sensitive      <=4 SENSITIVEThis is a modified FDA-approved test that has been validated and its performance characteristics determined by the reporting laboratory.  This laboratory is certified under the Clinical Laboratory Improvement Amendments CLIA as qualified to perform high complexity clinical laboratory testing.    MEROPENEM Value in next row Sensitive      <=4 SENSITIVEThis  is a modified FDA-approved test that has been validated and its performance characteristics determined by the reporting laboratory.  This laboratory is certified under the Clinical Laboratory Improvement Amendments CLIA as qualified to perform high complexity clinical laboratory testing.    * >=100,000 COLONIES/mL ESCHERICHIA COLI   Proteus mirabilis - MIC*    AMPICILLIN Value in next row Sensitive      <=4 SENSITIVEThis is a modified FDA-approved test that has been validated and its performance characteristics determined by the reporting laboratory.  This laboratory is certified under the Clinical Laboratory Improvement Amendments CLIA as qualified to perform high complexity  clinical laboratory testing.    CEFAZOLIN  (URINE) Value in next row Sensitive      4 SENSITIVEThis is a modified FDA-approved test that has been validated and its performance characteristics determined by the reporting laboratory.  This laboratory is certified under the Clinical Laboratory Improvement Amendments CLIA as qualified to perform high complexity clinical laboratory testing.    CEFEPIME  Value in next row Sensitive      4 SENSITIVEThis is a modified FDA-approved test that has been validated and its performance characteristics determined by the reporting laboratory.  This laboratory is certified under the Clinical Laboratory Improvement Amendments CLIA as qualified to perform high complexity clinical laboratory testing.    ERTAPENEM Value in next row Sensitive      4 SENSITIVEThis is a modified FDA-approved test that has been validated and its performance characteristics determined by the reporting laboratory.  This laboratory is certified under the Clinical Laboratory Improvement Amendments CLIA as qualified to perform high complexity clinical laboratory testing.    CEFTRIAXONE  Value in next row Sensitive      4 SENSITIVEThis is a modified FDA-approved test that has been validated and its performance characteristics  determined by the reporting laboratory.  This laboratory is certified under the Clinical Laboratory Improvement Amendments CLIA as qualified to perform high complexity clinical laboratory testing.    CIPROFLOXACIN  Value in next row Sensitive      4 SENSITIVEThis is a modified FDA-approved test that has been validated and its performance characteristics determined by the reporting laboratory.  This laboratory is certified under the Clinical Laboratory Improvement Amendments CLIA as qualified to perform high complexity clinical laboratory testing.    GENTAMICIN Value in next row Sensitive      4 SENSITIVEThis is a modified FDA-approved test that has been validated and its performance characteristics determined by the reporting laboratory.  This laboratory is certified under the Clinical Laboratory Improvement Amendments CLIA as qualified to perform high complexity clinical laboratory testing.    NITROFURANTOIN Value in next row Resistant      4 SENSITIVEThis is a modified FDA-approved test that has been validated and its performance characteristics determined by the reporting laboratory.  This laboratory is certified under the Clinical Laboratory Improvement Amendments CLIA as qualified to perform high complexity clinical laboratory testing.    TRIMETH /SULFA  Value in next row Sensitive      4 SENSITIVEThis is a modified FDA-approved test that has been validated and its performance characteristics determined by the reporting laboratory.  This laboratory is certified under the Clinical Laboratory Improvement Amendments CLIA as qualified to perform high complexity clinical laboratory testing.    AMPICILLIN/SULBACTAM Value in next row Sensitive      4 SENSITIVEThis is a modified FDA-approved test that has been validated and its performance characteristics determined by the reporting laboratory.  This laboratory is certified under the Clinical Laboratory Improvement Amendments CLIA as qualified to perform high  complexity clinical laboratory testing.    PIP/TAZO Value in next row Sensitive      <=4 SENSITIVEThis is a modified FDA-approved test that has been validated and its performance characteristics determined by the reporting laboratory.  This laboratory is certified under the Clinical Laboratory Improvement Amendments CLIA as qualified to perform high complexity clinical laboratory testing.    MEROPENEM  Value in next row Sensitive      <=4 SENSITIVEThis is a modified FDA-approved test that has been validated and its performance characteristics determined by the reporting laboratory.  This laboratory is certified under the Clinical Laboratory Improvement  Amendments CLIA as qualified to perform high complexity clinical laboratory testing.    * >=100,000 COLONIES/mL PROTEUS MIRABILIS  Culture, blood (Routine X 2) w Reflex to ID Panel     Status: None (Preliminary result)   Collection Time: 11/20/24  9:13 AM   Specimen: BLOOD LEFT ARM  Result Value Ref Range Status   Specimen Description   Final    BLOOD LEFT ARM Performed at [facility], [address], [place], [place] [address]    Special Requests   Final    BOTTLES DRAWN AEROBIC ONLY Blood Culture results may not be optimal due to an inadequate volume of blood received in culture bottles Performed at [facility], [address], [place], [place] [address]    Culture   Final    NO GROWTH < 24 HOURS Performed at [facility], [address], [place], [place] [address]    Report Status PENDING  Incomplete  Culture, blood (Routine X 2) w Reflex to ID Panel     Status: None (Preliminary result)   Collection Time: 11/20/24  9:13 AM   Specimen: BLOOD LEFT HAND  Result Value Ref Range Status   Specimen Description   Final    BLOOD LEFT HAND Performed at [facility], [address], [place], [place] [address]    Special Requests   Final    BOTTLES DRAWN AEROBIC ONLY Blood Culture results may not be optimal  due to an inadequate volume of blood received in culture bottles Performed at [facility], [address] [place], [place] [address]    Culture   Final    NO GROWTH < 24 HOURS Performed at [facility], [address], [place], [place] [address]    Report Status PENDING  Incomplete         Radiology Studies: No results found.      Scheduled Meds:  amLODipine   5 mg Oral Daily   amoxicillin-clavulanate  1 tablet Oral Q12H   aspirin  EC  81 mg Oral Daily   Chlorhexidine  Gluconate Cloth  6 each Topical Daily   heparin  5,000 Units Subcutaneous Q8H   insulin  aspart  0-6 Units Subcutaneous TID WC   tamsulosin   0.4 mg Oral Daily   Continuous Infusions:  vancomycin 1,000 mg (11/21/24 1307)     LOS: 5 days    Time spent: 40 mins    Toribio Hummer, MD [facility]   To contact the attending provider between 7A-7P or the covering provider during after hours 7P-7A, please log into the web site [email or web address] and access using universal [place] password for that web site. If you do not have the password, please call the hospital operator.  11/21/2024, 2:43 PM

## 2024-11-21 NOTE — Progress Notes (Signed)
 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 Family alerted [facility] that patient was not responding. Home visit completed by nurse. Patient was unresponsive and hypotensive. Family requested transfer to ED for evaluation. Patient was transported to ED from home. Patient is admitted for sepsis. Per Dr. Odella Pepper with [facility] this is a related hospital admission. Patient is a Full Code.   Patient seen at bedside. He was awake with eyes open and in no apparent distress. No family present during the visit. Mr. Bacci asked if it was time for dinner; he was informed that it was not yet time. Primary nurse reports that patient frequently asks for food. Patient denies pain and shortness of breath.  Left his daughter [name] a message providing an update.   Continues to receive IV antibiotics. Infectious disease was consulted and they are waiting on final results from cultures. Pt will likely be discharged home tomorrow on PO antibiotics.    Per chart review, patient remains GIP appropriate as he requires IV antibiotics.    Vital Signs: 99.0/7018  131/77, 100% Room Air   I/O: 337/not documented   Abnormal Labs:  11/21/24 04:59 Calcium : 8.7 (L) RBC: 2.55 (L) Hemoglobin: 7.6(L) HCT: 24.8(L) Diagnostics: none new   IV/PRN Meds:   Vancomycin 1000mg  IV x1, Tylenol  1,000mg  Q6H PRN x0   Assessment and Plan per  12.02.25  Principal Problem:   Sepsis (HCC) Active Problems:   Type II diabetes mellitus, well controlled (HCC)   Hyperlipidemia   Essential hypertension   BPH with obstruction/lower urinary tract symptoms   Malignant neoplasm of prostate (HCC)   Sacral decubitus ulcer, stage IV (HCC)   AKI (acute kidney injury)   Bacteremia   Urinary tract infection associated with indwelling urethral catheter   #1 sepsis secondary to  polymicrobial bacteremia (Proteus and Staph epidermidis bacteremia) and ESBL E. Coli UTI, POA -Felt likely seeded from stage IV decubitus ulcer +/- urine - Patient with history of prostate cancer with chronic Foley catheter in place noted to be bedridden. - At time of admission patient noted to be hypotensive, elevated lactic acid level, leukocytosis and initially started on IV vancomycin and IV cefepime , IV Flagyl. - Once cultures had resulted IV vancomycin was changed to meropenem. - 2/2 blood cultures with Staph epidermidis methicillin resistance detected, Proteus, Staphylococcus capitis. -Urine cultures with > 100,000 colonies of Proteus Mirabilis, > 100,000 colonies of ESBL E. coli - Patient seen in consultation by ID who reviewed cultures and sensitivities and recommending placing patient on Augmentin to cover Proteus and anaerobes in the wound, while continuing vancomycin for now but likely plan to exchange vancomycin for Zyvox to complete a total of 2 weeks of coverage targeting Staphylococcus species and Proteus. -Per ID patient already completed sufficient meropenem for ESBL found in the urine. -Appreciate ID input and recommendations.   2.  Stage IV decubitus ulcer - Patient seen by wound care. - See wound care recommendations.   3.  History of prostate cancer/BPH - Patient noted to be on hospice prior to admission. - Hospice liaison discussed with family and patient to return back home with hospice following on discharge.   4.  Hypertension -Patient noted to be on Norvasc  and losartan  prior to admission which were held due to soft blood pressure. - BP currently stable. - Norvasc  resumed at 5 mg daily.  Follow.   5.  Hyperlipidemia -Statin.   6.  Diabetes mellitus type 2 -Hemoglobin A1c noted at 5.1 on 11/26/2021. - CBG 79 this morning. - SSI.   7.  Hypokalemia -Repleted.   8.  Normocytic anemia/anemia of chronic disease - Hemoglobin currently at 7.6 today from 7.5 from  7.7 from 7.6 from 7.9 from 11.2 on admission. - Felt likely dilutional component. - Patient with no overt GI bleed. - FOBT pending. - Follow H&H.   9.  AKI -Improved with hydration.   10.  Pressure injury, POA Wound Pressure Injury Elbow Posterior;Left Stage 3 -  Full thickness tissue loss. Subcutaneous fat may be visible but bone, tendon or muscle are NOT exposed. (Active)          DVT prophylaxis: Heparin Code Status: Full Family Communication: Updated patient.  No family at bedside. Disposition: Back home with hospice once medically stable hopefully in the next 24 to 48 hours.   Status is: Inpatient Remains inpatient appropriate because: Severity of illness   Consultants:  ID: Dr.Van Dam 11/21/2024   Procedures:  Chest x-ray 11/16/2024                      Code Status: Full   Discharge Planning: ongoing   Family Contact:  Spoke with daughter [name] at bedside.    IDT: updated   Goals of Care: Full Code    Should patient need ambulance transfer at discharge- please use [facility]) as they contract this service for our active hospice patients.    Please call with any hospice related questions or concerns.   Greig Basket, BSN, RN [facility] Liaison [phone]

## 2024-11-21 NOTE — Plan of Care (Signed)
  Problem: Activity: Goal: Risk for activity intolerance will decrease Outcome: Progressing   Problem: Elimination: Goal: Will not experience complications related to bowel motility Outcome: Progressing   Problem: Education: Goal: Ability to describe self-care measures that may prevent or decrease complications (Diabetes Survival Skills Education) will improve Outcome: Progressing Goal: Individualized Educational Video(s) Outcome: Progressing   Problem: Metabolic: Goal: Ability to maintain appropriate glucose levels will improve Outcome: Progressing

## 2024-11-21 NOTE — Plan of Care (Signed)
  Problem: Clinical Measurements: Goal: Will remain free from infection Outcome: Progressing Goal: Cardiovascular complication will be avoided Outcome: Progressing   Problem: Coping: Goal: Level of anxiety will decrease Outcome: Progressing   Problem: Elimination: Goal: Will not experience complications related to urinary retention Outcome: Progressing   Problem: Pain Managment: Goal: General experience of comfort will improve and/or be controlled Outcome: Progressing   Problem: Safety: Goal: Ability to remain free from injury will improve Outcome: Progressing   Problem: Skin Integrity: Goal: Risk for impaired skin integrity will decrease Outcome: Progressing

## 2024-11-22 DIAGNOSIS — N179 Acute kidney failure, unspecified: Secondary | ICD-10-CM | POA: Diagnosis not present

## 2024-11-22 DIAGNOSIS — I10 Essential (primary) hypertension: Secondary | ICD-10-CM | POA: Diagnosis not present

## 2024-11-22 DIAGNOSIS — N401 Enlarged prostate with lower urinary tract symptoms: Secondary | ICD-10-CM | POA: Diagnosis not present

## 2024-11-22 DIAGNOSIS — C61 Malignant neoplasm of prostate: Secondary | ICD-10-CM | POA: Diagnosis not present

## 2024-11-22 LAB — BASIC METABOLIC PANEL WITH GFR
Anion gap: 5 (ref 5–15)
BUN: 9 mg/dL (ref 8–23)
CO2: 27 mmol/L (ref 22–32)
Calcium: 8.9 mg/dL (ref 8.9–10.3)
Chloride: 109 mmol/L (ref 98–111)
Creatinine, Ser: 0.55 mg/dL — ABNORMAL LOW (ref 0.61–1.24)
GFR, Estimated: >60 mL/min (ref >60)
Glucose, Bld: 111 mg/dL — ABNORMAL HIGH (ref 70–99)
Potassium: 3.8 mmol/L (ref 3.5–5.1)
Sodium: 140 mmol/L (ref 135–145)

## 2024-11-22 LAB — GLUCOSE, CAPILLARY
Glucose-Capillary: 77 mg/dL (ref 70–99)
Glucose-Capillary: 118 mg/dL — ABNORMAL HIGH (ref 70–99)
Glucose-Capillary: 90 mg/dL (ref 70–99)
Glucose-Capillary: 161 mg/dL — ABNORMAL HIGH (ref 70–99)

## 2024-11-22 LAB — CBC
HCT: 23.4 % — ABNORMAL LOW (ref 39.0–52.0)
Hemoglobin: 7.3 g/dL — ABNORMAL LOW (ref 13.0–17.0)
MCH: 30.7 pg (ref 26.0–34.0)
MCHC: 31.2 g/dL (ref 30.0–36.0)
MCV: 98.3 fL (ref 80.0–100.0)
Platelets: 259 K/uL (ref 150–400)
RBC: 2.38 MIL/uL — ABNORMAL LOW (ref 4.22–5.81)
RDW: 16.1 % — ABNORMAL HIGH (ref 11.5–15.5)
WBC: 5.2 K/uL (ref 4.0–10.5)
nRBC: 0 % (ref 0.0–0.2)

## 2024-11-22 MED ORDER — HALOPERIDOL 1 MG PO TABS: 1.0000 mg | ORAL_TABLET | ORAL | Status: DC | PRN

## 2024-11-22 MED ORDER — LINEZOLID 600 MG PO TABS
600.0000 mg | ORAL_TABLET | Freq: Two times a day (BID) | ORAL | Status: DC
  Administered 2024-11-22 – 2024-11-24 (×5): 600 mg via ORAL
  Filled 2024-11-22 (×6): qty 1

## 2024-11-22 NOTE — Progress Notes (Signed)
 PROGRESS NOTE    Tanner Griffin  FMW:[id number] DOB: February 11, 1941 DOA: 11/16/2024 PCP: Haze Kingfisher, MD   Chief Complaint  Patient presents with   Hypotension    Brief Narrative:  This is a 83 year old male who lives at home with his family had a fall in June since then he has been bedridden and has not walked much has a history of prostate cancer hypertension has a chronic Foley. Apparently the Foley catheter was changed by hospice last week per family. Patient is not able to provide me any history. He was admitted with concern for urinary tract infection and dehydration. He was found to have an elevated lactate level started on Vanco cefepime  and Flagyl. He also has a stage IV sacral decubitus. He is seen by wound care. Blood culture grew Staph epidermidis, Proteus, gram-negative Enterobacter, methicillin resistance detected    Assessment & Plan:   Principal Problem:   Polymicrobial bacterial infection Active Problems:   Type II diabetes mellitus, well controlled (HCC)   Hyperlipidemia   Essential hypertension   BPH with obstruction/lower urinary tract symptoms   Malignant neoplasm of prostate (HCC)   Sepsis (HCC)   Sacral decubitus ulcer, stage IV (HCC)   AKI (acute kidney injury)   Bacteremia   Urinary tract infection associated with indwelling urethral catheter  #1 sepsis secondary to polymicrobial bacteremia (Proteus and Staph epidermidis bacteremia) and ESBL E. Coli UTI, POA -Felt likely seeded from stage IV decubitus ulcer +/- urine - Patient with history of prostate cancer with chronic Foley catheter in place noted to be bedridden. - At time of admission patient noted to be hypotensive, elevated lactic acid level, leukocytosis and initially started on IV vancomycin and IV cefepime , IV Flagyl. - Once cultures had resulted IV vancomycin was changed to meropenem. - 2/2 blood cultures with Staph epidermidis methicillin resistance detected, Proteus, Staphylococcus  capitis. -Urine cultures with > 100,000 colonies of Proteus Mirabilis, > 100,000 colonies of ESBL E. coli - Patient seen in consultation by ID who reviewed cultures and sensitivities and recommending placing patient on Augmentin to cover Proteus and anaerobes in the wound, while continuing vancomycin for now but likely plan to exchange vancomycin for Zyvox to complete a total of 2 weeks of coverage targeting Staphylococcus species and Proteus. -Per ID patient already completed sufficient meropenem for ESBL found in the urine. -Appreciate ID input and recommendations.  2.  Stage IV decubitus ulcer - Patient seen by wound care. - See wound care recommendations.  3.  History of prostate cancer/BPH - Patient noted to be on hospice prior to admission. - Hospice liaison discussed with family and patient to return back home with hospice following on discharge.  4.  Hypertension -Patient noted to be on Norvasc  and losartan  prior to admission which were held due to soft blood pressure. - BP currently stable. - Norvasc  resumed at 5 mg daily.  Follow.  5.  Hyperlipidemia -Statin.  6.  Diabetes mellitus type 2 -Hemoglobin A1c noted at 5.1 on 11/26/2021. - CBG 118 this morning. - SSI.  7.  Hypokalemia -Repleted.  8.  Normocytic anemia/anemia of chronic disease - Hemoglobin currently at 7.3 from 7.6 from 7.5 from 7.7 from 7.6 from 7.9 from 11.2 on admission. - Felt likely dilutional component. - Patient with no overt GI bleed. - FOBT pending. - Follow H&H.  9.  AKI - Improved with hydration.   10.  Pressure injury, POA Wound Pressure Injury Elbow Posterior;Left Stage 3 -  Full thickness tissue loss.  Subcutaneous fat may be visible but bone, tendon or muscle are NOT exposed. (Active)       DVT prophylaxis: Heparin Code Status: Full Family Communication: Updated patient.  No family at bedside. Disposition: Back home with hospice once medically stable hopefully in the next 24 to 48  hours.  Status is: Inpatient Remains inpatient appropriate because: Severity of illness   Consultants:  ID: Dr.Van Dam 11/21/2024  Procedures:  Chest x-ray 11/16/2024   Antimicrobials:  Anti-infectives (From admission, onward)    Start     Dose/Rate Route Frequency Ordered Stop   11/21/24 1000  amoxicillin-clavulanate (AUGMENTIN) 875-125 MG per tablet 1 tablet        1 tablet Oral Every 12 hours 11/21/24 0907     11/18/24 1600  vancomycin (VANCOCIN) IVPB 1000 mg/200 mL premix  Status:  Discontinued        1,000 mg 200 mL/hr over 60 Minutes Intravenous Every 48 hours 11/17/24 1407 11/18/24 0922   11/18/24 1400  meropenem (MERREM) 1 g in sodium chloride  0.9 % 100 mL IVPB  Status:  Discontinued        1 g 200 mL/hr over 30 Minutes Intravenous Every 8 hours 11/18/24 0922 11/21/24 0907   11/18/24 1200  vancomycin (VANCOCIN) IVPB 1000 mg/200 mL premix        1,000 mg 200 mL/hr over 60 Minutes Intravenous Every 24 hours 11/18/24 0922     11/17/24 1600  vancomycin (VANCOCIN) IVPB 1000 mg/200 mL premix  Status:  Discontinued        1,000 mg 200 mL/hr over 60 Minutes Intravenous Every 48 hours 11/16/24 2130 11/17/24 1227   11/17/24 1600  vancomycin (VANCOCIN) IVPB 1000 mg/200 mL premix  Status:  Discontinued        1,000 mg 200 mL/hr over 60 Minutes Intravenous Every 48 hours 11/17/24 1403 11/17/24 1407   11/17/24 1400  ceFEPIme  (MAXIPIME ) 2 g in sodium chloride  0.9 % 100 mL IVPB  Status:  Discontinued        2 g 200 mL/hr over 30 Minutes Intravenous Daily 11/16/24 2052 11/17/24 1227   11/17/24 1315  cefTRIAXone  (ROCEPHIN ) 2 g in sodium chloride  0.9 % 100 mL IVPB  Status:  Discontinued        2 g 200 mL/hr over 30 Minutes Intravenous Every 24 hours 11/17/24 1227 11/18/24 0922   11/17/24 0600  metroNIDAZOLE (FLAGYL) IVPB 500 mg  Status:  Discontinued        500 mg 100 mL/hr over 60 Minutes Intravenous 2 times daily 11/16/24 2052 11/17/24 1227   11/16/24 2145  vancomycin (VANCOCIN) IVPB  1000 mg/200 mL premix  Status:  Discontinued        1,000 mg 200 mL/hr over 60 Minutes Intravenous Every 24 hours 11/16/24 2052 11/16/24 2130   11/16/24 1530  ceFEPIme  (MAXIPIME ) 2 g in sodium chloride  0.9 % 100 mL IVPB        2 g 200 mL/hr over 30 Minutes Intravenous  Once 11/16/24 1523 11/16/24 1721   11/16/24 1530  metroNIDAZOLE (FLAGYL) IVPB 500 mg        500 mg 100 mL/hr over 60 Minutes Intravenous  Once 11/16/24 1523 11/16/24 1735   11/16/24 1530  vancomycin (VANCOCIN) IVPB 1000 mg/200 mL premix        1,000 mg 200 mL/hr over 60 Minutes Intravenous  Once 11/16/24 1523 11/16/24 1822         Subjective: Sleeping but easily arousable.  Denies any chest pain or shortness  of breath.  No abdominal pain.   Objective: Vitals:   11/21/24 0947 11/21/24 1245 11/21/24 2116 11/22/24 0525  BP: (!) 148/71 131/77 (!) 149/98 (!) 150/75  Pulse:  70 77 68  Resp:  18 18 18   Temp:  99 F (37.2 C) 98.4 F (36.9 C) 98.6 F (37 C)  TempSrc:  Oral Oral   SpO2:      Weight:      Height:        Intake/Output Summary (Last 24 hours) at 11/22/2024 1046 Last data filed at 11/22/2024 0550 Gross per 24 hour  Intake 237 ml  Output 750 ml  Net -513 ml   Filed Weights   11/16/24 1515  Weight: 62 kg    Examination:  General exam: NAD. Respiratory system: CTAB.  No wheezes, no crackles, no rhonchi.  Fair air movement.  Speaking in full sentences.  Cardiovascular system: Regular rate rhythm no murmurs rubs or gallops.  No JVD.  No lower extremity edema.  Gastrointestinal system: Abdomen is soft, nontender, nondistended, positive bowel sounds.  No rebound.  No guarding. Central nervous system: Alert and oriented. No focal neurological deficits. Extremities: Symmetric 5 x 5 power. Skin: No rashes, lesions or ulcers Psychiatry: Judgement and insight appear normal. Mood & affect appropriate.     Data Reviewed: I have personally reviewed following labs and imaging studies  CBC: Recent Labs   Lab 11/16/24 1629 11/16/24 1827 11/17/24 1325 11/18/24 0540 11/19/24 0450 11/20/24 0534 11/21/24 0459  WBC 22.9*  --  22.2* 14.6* 13.0* 7.4 6.2  NEUTROABS 20.9*  --   --   --   --   --   --   HGB 9.7*   < > 7.9* 7.6* 7.7* 7.5* 7.6*  HCT 30.9*   < > 24.7* 23.9* 24.5* 25.0* 24.8*  MCV 96.6  --  96.9 96.0 96.1 99.2 97.3  PLT 273  --  214 217 222 221 235   < > = values in this interval not displayed.    Basic Metabolic Panel: Recent Labs  Lab 11/17/24 1325 11/18/24 0540 11/19/24 0450 11/20/24 0534 11/20/24 0837 11/21/24 0459  NA 136 138 141 141  --  141  K 3.1* 2.8* 4.0 4.0  --  3.8  CL 107 107 111 112*  --  111  CO2 23 24 24 26   --  26  GLUCOSE 107* 93 80 82  --  95  BUN 27* 26* 18 12  --  10  CREATININE 1.15 0.87 0.72 0.53*  --  0.62  CALCIUM  8.2* 8.1* 8.4* 8.8*  --  8.7*  MG 1.7 1.5*  --   --  1.8  --     GFR: Estimated Creatinine Clearance: 63.5 mL/min (by C-G formula based on SCr of 0.62 mg/dL).  Liver Function Tests: Recent Labs  Lab 11/16/24 1820 11/17/24 1325  AST 41 44*  ALT 9 12  ALKPHOS 88 75  BILITOT 1.3* 0.7  PROT 6.4* 5.7*  ALBUMIN 2.5* 2.2*    CBG: Recent Labs  Lab 11/21/24 0717 11/21/24 1218 11/21/24 1646 11/21/24 2118 11/22/24 0809  GLUCAP 79 87 112* 96 77     Recent Results (from the past 240 hours)  Resp panel by RT-PCR (RSV, Flu A&B, Covid) Anterior Nasal Swab     Status: None   Collection Time: 11/16/24  4:10 PM   Specimen: Anterior Nasal Swab  Result Value Ref Range Status   SARS Coronavirus 2 by RT PCR NEGATIVE NEGATIVE Final  Comment: (NOTE) SARS-CoV-2 target nucleic acids are NOT DETECTED.  The SARS-CoV-2 RNA is generally detectable in upper respiratory specimens during the acute phase of infection. The lowest concentration of SARS-CoV-2 viral copies this assay can detect is 138 copies/mL. A negative result does not preclude SARS-Cov-2 infection and should not be used as the sole basis for treatment or other  patient management decisions. A negative result may occur with  improper specimen collection/handling, submission of specimen other than nasopharyngeal swab, presence of viral mutation(s) within the areas targeted by this assay, and inadequate number of viral copies(<138 copies/mL). A negative result must be combined with clinical observations, patient history, and epidemiological information. The expected result is Negative.  Fact Sheet for Patients:  bloggercourse.com  Fact Sheet for Healthcare Providers:  seriousbroker.it  This test is no t yet approved or cleared by the [place]  FDA and  has been authorized for detection and/or diagnosis of SARS-CoV-2 by FDA under an Emergency Use Authorization (EUA). This EUA will remain  in effect (meaning this test can be used) for the duration of the COVID-19 declaration under Section 564(b)(1) of the Act, 21 U.S.C.section 360bbb-3(b)(1), unless the authorization is terminated  or revoked sooner.       Influenza A by PCR NEGATIVE NEGATIVE Final   Influenza B by PCR NEGATIVE NEGATIVE Final    Comment: (NOTE) The Xpert Xpress SARS-CoV-2/FLU/RSV plus assay is intended as an aid in the diagnosis of influenza from Nasopharyngeal swab specimens and should not be used as a sole basis for treatment. Nasal washings and aspirates are unacceptable for Xpert Xpress SARS-CoV-2/FLU/RSV testing.  Fact Sheet for Patients: bloggercourse.com  Fact Sheet for Healthcare Providers: seriousbroker.it  This test is not yet approved or cleared by the [place]  FDA and has been authorized for detection and/or diagnosis of SARS-CoV-2 by FDA under an Emergency Use Authorization (EUA). This EUA will remain in effect (meaning this test can be used) for the duration of the COVID-19 declaration under Section 564(b)(1) of the Act, 21 U.S.C. section  360bbb-3(b)(1), unless the authorization is terminated or revoked.     Resp Syncytial Virus by PCR NEGATIVE NEGATIVE Final    Comment: (NOTE) Fact Sheet for Patients: bloggercourse.com  Fact Sheet for Healthcare Providers: seriousbroker.it  This test is not yet approved or cleared by the [place]  FDA and has been authorized for detection and/or diagnosis of SARS-CoV-2 by FDA under an Emergency Use Authorization (EUA). This EUA will remain in effect (meaning this test can be used) for the duration of the COVID-19 declaration under Section 564(b)(1) of the Act, 21 U.S.C. section 360bbb-3(b)(1), unless the authorization is terminated or revoked.  Performed at [facility], [address] [place], [place] [address]   Blood Culture (routine x 2)     Status: Abnormal   Collection Time: 11/16/24  4:11 PM   Specimen: BLOOD LEFT WRIST  Result Value Ref Range Status   Specimen Description   Final    BLOOD LEFT WRIST Performed at [facility], [address] [place], [place] [address]    Special Requests   Final    BOTTLES DRAWN AEROBIC AND ANAEROBIC Blood Culture results may not be optimal due to an inadequate volume of blood received in culture bottles Performed at [facility], [address], [place], [place] [address]    Culture  Setup Time   Final    GRAM NEGATIVE RODS ANAEROBIC BOTTLE ONLY CRITICAL RESULT CALLED TO, READ BACK BY  AND VERIFIED WITH: PHARMD CHRISTINE SHADE [phone] AT 1120 BY EC GRAM POSITIVE COCCI AEROBIC BOTTLE ONLY CRITICAL VALUE NOTED.  VALUE IS CONSISTENT WITH PREVIOUSLY REPORTED AND CALLED VALUE. Performed at [facility], [address] [place], [place] [address]    Culture (A)  Final    PROTEUS MIRABILIS STAPHYLOCOCCUS EPIDERMIDIS STAPHYLOCOCCUS CAPITIS    Report Status 11/20/2024 FINAL  Final   Organism ID, Bacteria PROTEUS MIRABILIS  Final    Organism ID, Bacteria STAPHYLOCOCCUS EPIDERMIDIS  Final   Organism ID, Bacteria STAPHYLOCOCCUS CAPITIS  Final      Susceptibility   Proteus mirabilis - MIC*    AMPICILLIN <=2 SENSITIVE Sensitive     CEFAZOLIN  (NON-URINE) 4 INTERMEDIATE Intermediate     CEFEPIME  <=0.12 SENSITIVE Sensitive     ERTAPENEM <=0.12 SENSITIVE Sensitive     CEFTRIAXONE  <=0.25 SENSITIVE Sensitive     CIPROFLOXACIN  0.25 SENSITIVE Sensitive     GENTAMICIN <=1 SENSITIVE Sensitive     MEROPENEM 1 SENSITIVE Sensitive     TRIMETH /SULFA  <=20 SENSITIVE Sensitive     AMPICILLIN/SULBACTAM <=2 SENSITIVE Sensitive     PIP/TAZO Value in next row Sensitive      <=4 SENSITIVEThis is a modified FDA-approved test that has been validated and its performance characteristics determined by the reporting laboratory.  This laboratory is certified under the Clinical Laboratory Improvement Amendments CLIA as qualified to perform high complexity clinical laboratory testing.    * PROTEUS MIRABILIS   Staphylococcus capitis - MIC*    CIPROFLOXACIN  Value in next row Sensitive      <=4 SENSITIVEThis is a modified FDA-approved test that has been validated and its performance characteristics determined by the reporting laboratory.  This laboratory is certified under the Clinical Laboratory Improvement Amendments CLIA as qualified to perform high complexity clinical laboratory testing.    ERYTHROMYCIN Value in next row Resistant      <=4 SENSITIVEThis is a modified FDA-approved test that has been validated and its performance characteristics determined by the reporting laboratory.  This laboratory is certified under the Clinical Laboratory Improvement Amendments CLIA as qualified to perform high complexity clinical laboratory testing.    GENTAMICIN Value in next row Sensitive      <=4 SENSITIVEThis is a modified FDA-approved test that has been validated and its performance characteristics determined by the reporting laboratory.  This laboratory is  certified under the Clinical Laboratory Improvement Amendments CLIA as qualified to perform high complexity clinical laboratory testing.    OXACILLIN Value in next row Sensitive      <=4 SENSITIVEThis is a modified FDA-approved test that has been validated and its performance characteristics determined by the reporting laboratory.  This laboratory is certified under the Clinical Laboratory Improvement Amendments CLIA as qualified to perform high complexity clinical laboratory testing.    TETRACYCLINE Value in next row Sensitive      <=4 SENSITIVEThis is a modified FDA-approved test that has been validated and its performance characteristics determined by the reporting laboratory.  This laboratory is certified under the Clinical Laboratory Improvement Amendments CLIA as qualified to perform high complexity clinical laboratory testing.    VANCOMYCIN Value in next row Sensitive      <=4 SENSITIVEThis is a modified FDA-approved test that has been validated and its performance characteristics determined by the reporting laboratory.  This laboratory is certified under the Clinical Laboratory Improvement Amendments CLIA as qualified to perform high complexity clinical laboratory testing.    TRIMETH /SULFA  Value in next row Sensitive      <=  4 SENSITIVEThis is a modified FDA-approved test that has been validated and its performance characteristics determined by the reporting laboratory.  This laboratory is certified under the Clinical Laboratory Improvement Amendments CLIA as qualified to perform high complexity clinical laboratory testing.    CLINDAMYCIN Value in next row Intermediate      <=4 SENSITIVEThis is a modified FDA-approved test that has been validated and its performance characteristics determined by the reporting laboratory.  This laboratory is certified under the Clinical Laboratory Improvement Amendments CLIA as qualified to perform high complexity clinical laboratory testing.    RIFAMPIN Value in  next row Sensitive      <=4 SENSITIVEThis is a modified FDA-approved test that has been validated and its performance characteristics determined by the reporting laboratory.  This laboratory is certified under the Clinical Laboratory Improvement Amendments CLIA as qualified to perform high complexity clinical laboratory testing.    Inducible Clindamycin Value in next row Sensitive      <=4 SENSITIVEThis is a modified FDA-approved test that has been validated and its performance characteristics determined by the reporting laboratory.  This laboratory is certified under the Clinical Laboratory Improvement Amendments CLIA as qualified to perform high complexity clinical laboratory testing.    * STAPHYLOCOCCUS CAPITIS   Staphylococcus epidermidis - MIC*    CIPROFLOXACIN  Value in next row Resistant      <=4 SENSITIVEThis is a modified FDA-approved test that has been validated and its performance characteristics determined by the reporting laboratory.  This laboratory is certified under the Clinical Laboratory Improvement Amendments CLIA as qualified to perform high complexity clinical laboratory testing.    ERYTHROMYCIN Value in next row Resistant      <=4 SENSITIVEThis is a modified FDA-approved test that has been validated and its performance characteristics determined by the reporting laboratory.  This laboratory is certified under the Clinical Laboratory Improvement Amendments CLIA as qualified to perform high complexity clinical laboratory testing.    GENTAMICIN Value in next row Sensitive      <=4 SENSITIVEThis is a modified FDA-approved test that has been validated and its performance characteristics determined by the reporting laboratory.  This laboratory is certified under the Clinical Laboratory Improvement Amendments CLIA as qualified to perform high complexity clinical laboratory testing.    OXACILLIN Value in next row Resistant      <=4 SENSITIVEThis is a modified FDA-approved test that has been  validated and its performance characteristics determined by the reporting laboratory.  This laboratory is certified under the Clinical Laboratory Improvement Amendments CLIA as qualified to perform high complexity clinical laboratory testing.    TETRACYCLINE Value in next row Sensitive      <=4 SENSITIVEThis is a modified FDA-approved test that has been validated and its performance characteristics determined by the reporting laboratory.  This laboratory is certified under the Clinical Laboratory Improvement Amendments CLIA as qualified to perform high complexity clinical laboratory testing.    VANCOMYCIN Value in next row Sensitive      <=4 SENSITIVEThis is a modified FDA-approved test that has been validated and its performance characteristics determined by the reporting laboratory.  This laboratory is certified under the Clinical Laboratory Improvement Amendments CLIA as qualified to perform high complexity clinical laboratory testing.    TRIMETH /SULFA  Value in next row Sensitive      <=4 SENSITIVEThis is a modified FDA-approved test that has been validated and its performance characteristics determined by the reporting laboratory.  This laboratory is certified under the Clinical Laboratory Improvement Amendments CLIA as qualified to  perform high complexity clinical laboratory testing.    CLINDAMYCIN Value in next row Sensitive      <=4 SENSITIVEThis is a modified FDA-approved test that has been validated and its performance characteristics determined by the reporting laboratory.  This laboratory is certified under the Clinical Laboratory Improvement Amendments CLIA as qualified to perform high complexity clinical laboratory testing.    RIFAMPIN Value in next row Sensitive      <=4 SENSITIVEThis is a modified FDA-approved test that has been validated and its performance characteristics determined by the reporting laboratory.  This laboratory is certified under the Clinical Laboratory Improvement  Amendments CLIA as qualified to perform high complexity clinical laboratory testing.    Inducible Clindamycin Value in next row Sensitive      <=4 SENSITIVEThis is a modified FDA-approved test that has been validated and its performance characteristics determined by the reporting laboratory.  This laboratory is certified under the Clinical Laboratory Improvement Amendments CLIA as qualified to perform high complexity clinical laboratory testing.    * STAPHYLOCOCCUS EPIDERMIDIS  Blood Culture ID Panel (Reflexed)     Status: Abnormal   Collection Time: 11/16/24  4:11 PM  Result Value Ref Range Status   Enterococcus faecalis NOT DETECTED NOT DETECTED Final   Enterococcus Faecium NOT DETECTED NOT DETECTED Final   Listeria monocytogenes NOT DETECTED NOT DETECTED Final   Staphylococcus species DETECTED (A) NOT DETECTED Final    Comment: CRITICAL RESULT CALLED TO, READ BACK BY AND VERIFIED WITH: PHARMD CHRISTINE SHADE [id number] AT 1120 BY EC    Staphylococcus aureus (BCID) NOT DETECTED NOT DETECTED Final   Staphylococcus epidermidis DETECTED (A) NOT DETECTED Final    Comment: Methicillin (oxacillin) resistant coagulase negative staphylococcus. Possible blood culture contaminant (unless isolated from more than one blood culture draw or clinical case suggests pathogenicity). No antibiotic treatment is indicated for blood  culture contaminants. CRITICAL RESULT CALLED TO, READ BACK BY AND VERIFIED WITH: PHARMD CHRISTINE SHADE [id number] AT 1120 BY EC    Staphylococcus lugdunensis NOT DETECTED NOT DETECTED Final   Streptococcus species NOT DETECTED NOT DETECTED Final   Streptococcus agalactiae NOT DETECTED NOT DETECTED Final   Streptococcus pneumoniae NOT DETECTED NOT DETECTED Final   Streptococcus pyogenes NOT DETECTED NOT DETECTED Final   A.calcoaceticus-baumannii NOT DETECTED NOT DETECTED Final   Bacteroides fragilis NOT DETECTED NOT DETECTED Final   Enterobacterales DETECTED (A) NOT DETECTED Final     Comment: Enterobacterales represent a large order of gram negative bacteria, not a single organism. CRITICAL RESULT CALLED TO, READ BACK BY AND VERIFIED WITH: PHARMD CHRISTINE SHADE [id number] AT 1120 BY EC    Enterobacter cloacae complex NOT DETECTED NOT DETECTED Final   Escherichia coli NOT DETECTED NOT DETECTED Final   Klebsiella aerogenes NOT DETECTED NOT DETECTED Final   Klebsiella oxytoca NOT DETECTED NOT DETECTED Final   Klebsiella pneumoniae NOT DETECTED NOT DETECTED Final   Proteus species DETECTED (A) NOT DETECTED Final    Comment: CRITICAL RESULT CALLED TO, READ BACK BY AND VERIFIED WITH: PHARMD CHRISTINE SHADE [id number] AT 1120 BY EC    Salmonella species NOT DETECTED NOT DETECTED Final   Serratia marcescens NOT DETECTED NOT DETECTED Final   Haemophilus influenzae NOT DETECTED NOT DETECTED Final   Neisseria meningitidis NOT DETECTED NOT DETECTED Final   Pseudomonas aeruginosa NOT DETECTED NOT DETECTED Final   Stenotrophomonas maltophilia NOT DETECTED NOT DETECTED Final   Candida albicans NOT DETECTED NOT DETECTED Final   Candida auris NOT DETECTED NOT DETECTED Final   Candida  glabrata NOT DETECTED NOT DETECTED Final   Candida krusei NOT DETECTED NOT DETECTED Final   Candida parapsilosis NOT DETECTED NOT DETECTED Final   Candida tropicalis NOT DETECTED NOT DETECTED Final   Cryptococcus neoformans/gattii NOT DETECTED NOT DETECTED Final   CTX-M ESBL NOT DETECTED NOT DETECTED Final   Carbapenem resistance IMP NOT DETECTED NOT DETECTED Final   Carbapenem resistance KPC NOT DETECTED NOT DETECTED Final   Methicillin resistance mecA/C DETECTED (A) NOT DETECTED Final    Comment: CRITICAL RESULT CALLED TO, READ BACK BY AND VERIFIED WITH: PHARMD CHRISTINE SHADE [id number] AT 1120 BY EC    Carbapenem resistance NDM NOT DETECTED NOT DETECTED Final   Carbapenem resist OXA 48 LIKE NOT DETECTED NOT DETECTED Final   Carbapenem resistance VIM NOT DETECTED NOT DETECTED Final    Comment:  Performed at [facility], [address], [place], [place] [address]  Blood Culture (routine x 2)     Status: Abnormal (Preliminary result)   Collection Time: 11/16/24  4:16 PM   Specimen: BLOOD RIGHT WRIST  Result Value Ref Range Status   Specimen Description   Final    BLOOD RIGHT WRIST Performed at [facility], [address], [place], [place] [address]    Special Requests   Final    BOTTLES DRAWN AEROBIC AND ANAEROBIC Blood Culture results may not be optimal due to an inadequate volume of blood received in culture bottles Performed at [facility], [address], [place], [place] [address]    Culture  Setup Time   Final    GRAM POSITIVE COCCI AEROBIC BOTTLE ONLY CRITICAL RESULT CALLED TO, READ BACK BY AND VERIFIED WITH: PHARMD CRYSTAL ROBERTSON [id number] AT 1402 BY EC GRAM NEGATIVE RODS ANAEROBIC BOTTLE ONLY CRITICAL VALUE NOTED.  VALUE IS CONSISTENT WITH PREVIOUSLY REPORTED AND CALLED VALUE.    Culture (A)  Final    STAPHYLOCOCCUS CAPITIS STAPHYLOCOCCUS EPIDERMIDIS SUSCEPTIBILITIES TO FOLLOW PROTEUS MIRABILIS SUSCEPTIBILITIES PERFORMED ON PREVIOUS CULTURE WITHIN THE LAST 5 DAYS. Performed at [facility], [address], [place], [place] [address]    Report Status PENDING  Incomplete  Urine Culture     Status: Abnormal   Collection Time: 11/16/24  4:39 PM   Specimen: Urine, Random  Result Value Ref Range Status   Specimen Description   Final    URINE, RANDOM Performed at [facility], [address], [place], [place] [address]    Special Requests   Final    NONE Reflexed from [id number] Performed at [facility], [address], [place], [place] [address]    Culture (A)  Final    >=100,000 COLONIES/mL PROTEUS MIRABILIS >=100,000 COLONIES/mL ESCHERICHIA COLI Confirmed Extended Spectrum Beta-Lactamase Producer (ESBL).  In bloodstream infections from ESBL organisms, carbapenems are preferred over  piperacillin/tazobactam. They are shown to have a lower risk of mortality.    Report Status 11/18/2024 FINAL  Final   Organism ID, Bacteria PROTEUS MIRABILIS (A)  Final   Organism ID, Bacteria ESCHERICHIA COLI (A)  Final      Susceptibility   Escherichia coli - MIC*    AMPICILLIN >=32 RESISTANT Resistant     CEFAZOLIN  (URINE) Value in next row Resistant      >=32 RESISTANTThis is a modified FDA-approved test that has been validated and its performance characteristics determined by the reporting laboratory.  This laboratory is certified under the Clinical Laboratory Improvement Amendments CLIA as qualified to perform high complexity clinical laboratory testing.  CEFEPIME  Value in next row Intermediate      >=32 RESISTANTThis is a modified FDA-approved test that has been validated and its performance characteristics determined by the reporting laboratory.  This laboratory is certified under the Clinical Laboratory Improvement Amendments CLIA as qualified to perform high complexity clinical laboratory testing.    ERTAPENEM Value in next row Sensitive      >=32 RESISTANTThis is a modified FDA-approved test that has been validated and its performance characteristics determined by the reporting laboratory.  This laboratory is certified under the Clinical Laboratory Improvement Amendments CLIA as qualified to perform high complexity clinical laboratory testing.    CEFTRIAXONE  Value in next row Resistant      >=32 RESISTANTThis is a modified FDA-approved test that has been validated and its performance characteristics determined by the reporting laboratory.  This laboratory is certified under the Clinical Laboratory Improvement Amendments CLIA as qualified to perform high complexity clinical laboratory testing.    CIPROFLOXACIN  Value in next row Resistant      >=32 RESISTANTThis is a modified FDA-approved test that has been validated and its performance characteristics determined by the reporting  laboratory.  This laboratory is certified under the Clinical Laboratory Improvement Amendments CLIA as qualified to perform high complexity clinical laboratory testing.    GENTAMICIN Value in next row Sensitive      >=32 RESISTANTThis is a modified FDA-approved test that has been validated and its performance characteristics determined by the reporting laboratory.  This laboratory is certified under the Clinical Laboratory Improvement Amendments CLIA as qualified to perform high complexity clinical laboratory testing.    NITROFURANTOIN Value in next row Sensitive      >=32 RESISTANTThis is a modified FDA-approved test that has been validated and its performance characteristics determined by the reporting laboratory.  This laboratory is certified under the Clinical Laboratory Improvement Amendments CLIA as qualified to perform high complexity clinical laboratory testing.    TRIMETH /SULFA  Value in next row Resistant      >=32 RESISTANTThis is a modified FDA-approved test that has been validated and its performance characteristics determined by the reporting laboratory.  This laboratory is certified under the Clinical Laboratory Improvement Amendments CLIA as qualified to perform high complexity clinical laboratory testing.    AMPICILLIN/SULBACTAM Value in next row Resistant      >=32 RESISTANTThis is a modified FDA-approved test that has been validated and its performance characteristics determined by the reporting laboratory.  This laboratory is certified under the Clinical Laboratory Improvement Amendments CLIA as qualified to perform high complexity clinical laboratory testing.    PIP/TAZO Value in next row Sensitive      <=4 SENSITIVEThis is a modified FDA-approved test that has been validated and its performance characteristics determined by the reporting laboratory.  This laboratory is certified under the Clinical Laboratory Improvement Amendments CLIA as qualified to perform high complexity clinical  laboratory testing.    MEROPENEM Value in next row Sensitive      <=4 SENSITIVEThis is a modified FDA-approved test that has been validated and its performance characteristics determined by the reporting laboratory.  This laboratory is certified under the Clinical Laboratory Improvement Amendments CLIA as qualified to perform high complexity clinical laboratory testing.    * >=100,000 COLONIES/mL ESCHERICHIA COLI   Proteus mirabilis - MIC*    AMPICILLIN Value in next row Sensitive      <=4 SENSITIVEThis is a modified FDA-approved test that has been validated and its performance characteristics determined by the reporting laboratory.  This laboratory  is certified under the Clinical Laboratory Improvement Amendments CLIA as qualified to perform high complexity clinical laboratory testing.    CEFAZOLIN  (URINE) Value in next row Sensitive      4 SENSITIVEThis is a modified FDA-approved test that has been validated and its performance characteristics determined by the reporting laboratory.  This laboratory is certified under the Clinical Laboratory Improvement Amendments CLIA as qualified to perform high complexity clinical laboratory testing.    CEFEPIME  Value in next row Sensitive      4 SENSITIVEThis is a modified FDA-approved test that has been validated and its performance characteristics determined by the reporting laboratory.  This laboratory is certified under the Clinical Laboratory Improvement Amendments CLIA as qualified to perform high complexity clinical laboratory testing.    ERTAPENEM Value in next row Sensitive      4 SENSITIVEThis is a modified FDA-approved test that has been validated and its performance characteristics determined by the reporting laboratory.  This laboratory is certified under the Clinical Laboratory Improvement Amendments CLIA as qualified to perform high complexity clinical laboratory testing.    CEFTRIAXONE  Value in next row Sensitive      4 SENSITIVEThis is a modified  FDA-approved test that has been validated and its performance characteristics determined by the reporting laboratory.  This laboratory is certified under the Clinical Laboratory Improvement Amendments CLIA as qualified to perform high complexity clinical laboratory testing.    CIPROFLOXACIN  Value in next row Sensitive      4 SENSITIVEThis is a modified FDA-approved test that has been validated and its performance characteristics determined by the reporting laboratory.  This laboratory is certified under the Clinical Laboratory Improvement Amendments CLIA as qualified to perform high complexity clinical laboratory testing.    GENTAMICIN Value in next row Sensitive      4 SENSITIVEThis is a modified FDA-approved test that has been validated and its performance characteristics determined by the reporting laboratory.  This laboratory is certified under the Clinical Laboratory Improvement Amendments CLIA as qualified to perform high complexity clinical laboratory testing.    NITROFURANTOIN Value in next row Resistant      4 SENSITIVEThis is a modified FDA-approved test that has been validated and its performance characteristics determined by the reporting laboratory.  This laboratory is certified under the Clinical Laboratory Improvement Amendments CLIA as qualified to perform high complexity clinical laboratory testing.    TRIMETH /SULFA  Value in next row Sensitive      4 SENSITIVEThis is a modified FDA-approved test that has been validated and its performance characteristics determined by the reporting laboratory.  This laboratory is certified under the Clinical Laboratory Improvement Amendments CLIA as qualified to perform high complexity clinical laboratory testing.    AMPICILLIN/SULBACTAM Value in next row Sensitive      4 SENSITIVEThis is a modified FDA-approved test that has been validated and its performance characteristics determined by the reporting laboratory.  This laboratory is certified under the  Clinical Laboratory Improvement Amendments CLIA as qualified to perform high complexity clinical laboratory testing.    PIP/TAZO Value in next row Sensitive      <=4 SENSITIVEThis is a modified FDA-approved test that has been validated and its performance characteristics determined by the reporting laboratory.  This laboratory is certified under the Clinical Laboratory Improvement Amendments CLIA as qualified to perform high complexity clinical laboratory testing.    MEROPENEM Value in next row Sensitive      <=4 SENSITIVEThis is a modified FDA-approved test that has been validated and its performance characteristics  determined by the reporting laboratory.  This laboratory is certified under the Clinical Laboratory Improvement Amendments CLIA as qualified to perform high complexity clinical laboratory testing.    * >=100,000 COLONIES/mL PROTEUS MIRABILIS  Culture, blood (Routine X 2) w Reflex to ID Panel     Status: None (Preliminary result)   Collection Time: 11/20/24  9:13 AM   Specimen: BLOOD LEFT ARM  Result Value Ref Range Status   Specimen Description   Final    BLOOD LEFT ARM Performed at [facility], [address] [place], [place] [address]    Special Requests   Final    BOTTLES DRAWN AEROBIC ONLY Blood Culture results may not be optimal due to an inadequate volume of blood received in culture bottles Performed at [facility], [address] [place], [place] [address]    Culture   Final    NO GROWTH 2 DAYS Performed at [facility], [address] [place], [place] [address]    Report Status PENDING  Incomplete  Culture, blood (Routine X 2) w Reflex to ID Panel     Status: None (Preliminary result)   Collection Time: 11/20/24  9:13 AM   Specimen: BLOOD LEFT HAND  Result Value Ref Range Status   Specimen Description   Final    BLOOD LEFT HAND Performed at [facility], [address], [place], [place] [address]    Special Requests    Final    BOTTLES DRAWN AEROBIC ONLY Blood Culture results may not be optimal due to an inadequate volume of blood received in culture bottles Performed at [facility], [address], [place], [place] [address]    Culture   Final    NO GROWTH 2 DAYS Performed at [facility], [address], [place], [place] [address]    Report Status PENDING  Incomplete         Radiology Studies: No results found.      Scheduled Meds:  amLODipine   5 mg Oral Daily   amoxicillin-clavulanate  1 tablet Oral Q12H   aspirin  EC  81 mg Oral Daily   Chlorhexidine  Gluconate Cloth  6 each Topical Daily   heparin  5,000 Units Subcutaneous Q8H   insulin  aspart  0-6 Units Subcutaneous TID WC   tamsulosin   0.4 mg Oral Daily   Continuous Infusions:  vancomycin 1,000 mg (11/21/24 1307)     LOS: 6 days    Time spent: 35 minutes.    Toribio Hummer, MD [facility]   To contact the attending provider between 7A-7P or the covering provider during after hours 7P-7A, please log into the web site [email or web address] and access using universal [place] password for that web site. If you do not have the password, please call the hospital operator.  11/22/2024, 10:46 AM

## 2024-11-22 NOTE — Plan of Care (Signed)
  Problem: Education: Goal: Knowledge of General Education information will improve Description: Including pain rating scale, medication(s)/side effects and non-pharmacologic comfort measures Outcome: Progressing   Problem: Health Behavior/Discharge Planning: Goal: Ability to manage health-related needs will improve Outcome: Progressing   Problem: Clinical Measurements: Goal: Ability to maintain clinical measurements within normal limits will improve Outcome: Progressing   Problem: Activity: Goal: Risk for activity intolerance will decrease Outcome: Progressing   Problem: Nutrition: Goal: Adequate nutrition will be maintained Outcome: Progressing   Problem: Elimination: Goal: Will not experience complications related to urinary retention Outcome: Progressing   Problem: Pain Managment: Goal: General experience of comfort will improve and/or be controlled Outcome: Progressing   Problem: Education: Goal: Individualized Educational Video(s) Outcome: Progressing   Problem: Fluid Volume: Goal: Ability to maintain a balanced intake and output will improve Outcome: Progressing

## 2024-11-22 NOTE — Plan of Care (Signed)
  Problem: Clinical Measurements: Goal: Ability to maintain clinical measurements within normal limits will improve Outcome: Progressing Goal: Will remain free from infection Outcome: Progressing Goal: Diagnostic test results will improve Outcome: Progressing Goal: Respiratory complications will improve Outcome: Progressing Goal: Cardiovascular complication will be avoided Outcome: Progressing   Problem: Nutrition: Goal: Adequate nutrition will be maintained Outcome: Progressing   Problem: Coping: Goal: Level of anxiety will decrease Outcome: Progressing   Problem: Elimination: Goal: Will not experience complications related to bowel motility Outcome: Progressing Goal: Will not experience complications related to urinary retention Outcome: Progressing   Problem: Pain Managment: Goal: General experience of comfort will improve and/or be controlled Outcome: Progressing   Problem: Safety: Goal: Ability to remain free from injury will improve Outcome: Progressing   Problem: Skin Integrity: Goal: Risk for impaired skin integrity will decrease Outcome: Progressing

## 2024-11-22 NOTE — Progress Notes (Signed)
 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 Family alerted [facility] that patient was not responding. Home visit completed by nurse. Patient was unresponsive and hypotensive. Family requested transfer to ED for evaluation. Patient was transported to ED from home. Patient is admitted for sepsis. Per Dr. Odella Pepper with [facility] this is a related hospital admission. Patient is a Full Code.   Patient seen at bedside. He had his eyes closed, appeared to be sleeping without any distress. Daughter [name] returned writer's call from yesterday and said that MD updated her and she is hoping her Father can go home today after receiving results from the cultures.    Per chart review, patient remains GIP appropriate. ID awaiting wound culture results.    Vital Signs: 99.0/7018  131/77, 100% Room Air   I/O: 337/not documented   Abnormal Labs: none taken today Diagnostics: none new   IV/PRN Meds:   Tylenol  1,000mg  Q6H PRN x0   Assessment and Plan per  Dr. Toribio Guiles note on  12.04.25:  Principal Problem:   Polymicrobial bacterial infection Active Problems:  Sepsis (HCC)   Sacral decubitus ulcer, stage IV (HCC)   Bacteremia   Urinary tract infection associated with indwelling urethral catheter   #1 sepsis secondary to polymicrobial bacteremia (Proteus and Staph epidermidis bacteremia) and ESBL E. Coli UTI, POA -Felt likely seeded from stage IV decubitus ulcer +/- urine - Patient with history of prostate cancer with chronic Foley catheter in place noted to be bedridden. - At time of admission patient noted to be hypotensive, elevated lactic acid level, leukocytosis and initially started on IV vancomycin and IV cefepime , IV Flagyl. - Once cultures had resulted IV vancomycin was changed to meropenem. - 2/2 blood cultures with Staph  epidermidis methicillin resistance detected, Proteus, Staphylococcus capitis. -Urine cultures with > 100,000 colonies of Proteus Mirabilis, > 100,000 colonies of ESBL E. coli - Patient seen in consultation by ID who reviewed cultures and sensitivities and recommending placing patient on Augmentin to cover Proteus and anaerobes in the wound, while continuing vancomycin for now but likely plan to exchange vancomycin for Zyvox to complete a total of 2 weeks of coverage targeting Staphylococcus species and Proteus. -Per ID patient already completed sufficient meropenem for ESBL found in the urine. -Appreciate ID input and recommendations.   2.  Stage IV decubitus ulcer - Patient seen by wound care. - See wound care recommendations.   3.  History of prostate cancer/BPH - Patient noted to be on hospice prior to admission. - Hospice liaison discussed with family and patient to return back home with hospice following on discharge 10.  Pressure injury, POA Wound Pressure Injury Elbow Posterior;Left Stage 3 -  Full thickness tissue loss. Subcutaneous fat may be visible but bone, tendon or muscle are NOT exposed. (Active)   DVT prophylaxis: Heparin Code Status: Full Family Communication: Updated patient.  No family at bedside. Disposition: Back home with hospice once medically stable hopefully in the next 24 to 48 hours.   Status is: Inpatient Remains inpatient appropriate because: Severity of illness   Consultants:  ID: Dr.Van Dam 11/21/2024     Examination:   General exam: NAD. Respiratory system: CTAB.  No wheezes, no crackles, no rhonchi.  Fair air movement.  Speaking in full sentences.  Cardiovascular system: Regular rate rhythm no murmurs rubs or gallops.  No JVD.  No lower  extremity edema.  Gastrointestinal system: Abdomen is soft, nontender, nondistended, positive bowel sounds.  No rebound.  No guarding. Central nervous system: Alert and oriented. No focal neurological  deficits. Extremities: Symmetric 5 x 5 power. Skin: No rashes, lesions or ulcers Psychiatry: Judgement and insight appear normal. Mood & affect appropriate.        Subjective: Sleeping but easily arousable.  Denies any chest pain or shortness of breath.  No abdominal pain.       Code Status: Full   Discharge Planning: ongoing   Family Contact:  Spoke with daughter [name] by phone this morning.   IDT: updated   Goals of Care: Full Code    Should patient need ambulance transfer at discharge- please use [facility]) as they contract this service for our active hospice patients.    Please call with any hospice related questions or concerns.   Greig Basket, BSN, RN [facility] Liaison [phone]

## 2024-11-23 DIAGNOSIS — I10 Essential (primary) hypertension: Secondary | ICD-10-CM | POA: Diagnosis not present

## 2024-11-23 DIAGNOSIS — N401 Enlarged prostate with lower urinary tract symptoms: Secondary | ICD-10-CM | POA: Diagnosis not present

## 2024-11-23 DIAGNOSIS — N179 Acute kidney failure, unspecified: Secondary | ICD-10-CM | POA: Diagnosis not present

## 2024-11-23 DIAGNOSIS — C61 Malignant neoplasm of prostate: Secondary | ICD-10-CM | POA: Diagnosis not present

## 2024-11-23 LAB — CBC WITH DIFFERENTIAL/PLATELET
Abs Immature Granulocytes: 0.05 K/uL (ref 0.00–0.07)
Basophils Absolute: 0 K/uL (ref 0.0–0.1)
Basophils Relative: 1 %
Eosinophils Absolute: 0.3 K/uL (ref 0.0–0.5)
Eosinophils Relative: 6 %
HCT: 22.4 % — ABNORMAL LOW (ref 39.0–52.0)
Hemoglobin: 6.9 g/dL — CL (ref 13.0–17.0)
Immature Granulocytes: 1 %
Lymphocytes Relative: 27 %
Lymphs Abs: 1.6 K/uL (ref 0.7–4.0)
MCH: 30.8 pg (ref 26.0–34.0)
MCHC: 30.8 g/dL (ref 30.0–36.0)
MCV: 100 fL (ref 80.0–100.0)
Monocytes Absolute: 0.5 K/uL (ref 0.1–1.0)
Monocytes Relative: 9 %
Neutro Abs: 3.4 K/uL (ref 1.7–7.7)
Neutrophils Relative %: 56 %
Platelets: 279 K/uL (ref 150–400)
RBC: 2.24 MIL/uL — ABNORMAL LOW (ref 4.22–5.81)
RDW: 16.4 % — ABNORMAL HIGH (ref 11.5–15.5)
WBC: 5.9 K/uL (ref 4.0–10.5)
nRBC: 0 % (ref 0.0–0.2)

## 2024-11-23 LAB — CULTURE, BLOOD (ROUTINE X 2)

## 2024-11-23 LAB — IRON AND TIBC
Iron: 19 ug/dL — ABNORMAL LOW (ref 45–182)
Saturation Ratios: 14 % — ABNORMAL LOW (ref 17.9–39.5)
TIBC: 137 ug/dL — ABNORMAL LOW (ref 250–450)
UIBC: 118 ug/dL

## 2024-11-23 LAB — BASIC METABOLIC PANEL WITH GFR
Anion gap: 6 (ref 5–15)
BUN: 10 mg/dL (ref 8–23)
CO2: 27 mmol/L (ref 22–32)
Calcium: 8.9 mg/dL (ref 8.9–10.3)
Chloride: 108 mmol/L (ref 98–111)
Creatinine, Ser: 0.68 mg/dL (ref 0.61–1.24)
GFR, Estimated: >60 mL/min (ref >60)
Glucose, Bld: 89 mg/dL (ref 70–99)
Potassium: 3.6 mmol/L (ref 3.5–5.1)
Sodium: 141 mmol/L (ref 135–145)

## 2024-11-23 LAB — GLUCOSE, CAPILLARY
Glucose-Capillary: 67 mg/dL — ABNORMAL LOW (ref 70–99)
Glucose-Capillary: 86 mg/dL (ref 70–99)
Glucose-Capillary: 93 mg/dL (ref 70–99)
Glucose-Capillary: 108 mg/dL — ABNORMAL HIGH (ref 70–99)
Glucose-Capillary: 94 mg/dL (ref 70–99)

## 2024-11-23 LAB — FERRITIN: Ferritin: 618 ng/mL — ABNORMAL HIGH (ref 24–336)

## 2024-11-23 LAB — FOLATE: Folate: 4.1 ng/mL — ABNORMAL LOW (ref >5.9)

## 2024-11-23 LAB — PREPARE RBC (CROSSMATCH)

## 2024-11-23 LAB — HEMOGLOBIN A1C
Hgb A1c MFr Bld: <4.2 % — ABNORMAL LOW (ref 4.8–5.6)
Mean Plasma Glucose: <74 mg/dL

## 2024-11-23 LAB — VITAMIN B12: Vitamin B-12: 1070 pg/mL — ABNORMAL HIGH (ref 180–914)

## 2024-11-23 MED ORDER — DIPHENHYDRAMINE HCL 25 MG PO CAPS
25.0000 mg | ORAL_CAPSULE | Freq: Once | ORAL | Status: AC
  Administered 2024-11-23: 25 mg via ORAL
  Filled 2024-11-23: qty 1

## 2024-11-23 MED ORDER — ACETAMINOPHEN 325 MG PO TABS
650.0000 mg | ORAL_TABLET | Freq: Once | ORAL | Status: AC
  Administered 2024-11-23: 650 mg via ORAL
  Filled 2024-11-23: qty 2

## 2024-11-23 MED ORDER — FOLIC ACID 1 MG PO TABS
1.0000 mg | ORAL_TABLET | Freq: Every day | ORAL | Status: DC
  Administered 2024-11-23 – 2024-11-24 (×2): 1 mg via ORAL
  Filled 2024-11-23 (×2): qty 1

## 2024-11-23 MED ORDER — SODIUM CHLORIDE 0.9% IV SOLUTION: Freq: Once | INTRAVENOUS | Status: AC

## 2024-11-23 MED ORDER — FUROSEMIDE 10 MG/ML IJ SOLN
20.0000 mg | Freq: Once | INTRAMUSCULAR | Status: AC
  Administered 2024-11-23: 20 mg via INTRAVENOUS
  Filled 2024-11-23: qty 2

## 2024-11-23 NOTE — Progress Notes (Signed)
      The blood cultures were concondant  He is covered with augmentin  and zyvox   Plan is as outlined in my last note  I will sign off for now.  Please call with further questions.   Tanner Griffin Dam 11/23/2024, 3:28 PM

## 2024-11-23 NOTE — Progress Notes (Addendum)
 PROGRESS NOTE    Tanner Griffin  FMW:[id number] DOB: [birth year] DOA: 11/16/2024 PCP: Haze Kingfisher, MD   Chief Complaint  Patient presents with   Hypotension    Brief Narrative:  This is a 83 year old male who lives at home with his family had a fall in June since then he has been bedridden and has not walked much has a history of prostate cancer hypertension has a chronic Foley. Apparently the Foley catheter was changed by hospice last week per family. Patient is not able to provide me any history. He was admitted with concern for urinary tract infection and dehydration. He was found to have an elevated lactate level started on Vanco cefepime  and Flagyl . He also has a stage IV sacral decubitus. He is seen by wound care. Blood culture grew Staph epidermidis, Proteus, gram-negative Enterobacter, methicillin resistance detected    Assessment & Plan:   Principal Problem:   Polymicrobial bacterial infection Active Problems:   Type II diabetes mellitus, well controlled (HCC)   Hyperlipidemia   Essential hypertension   BPH with obstruction/lower urinary tract symptoms   Malignant neoplasm of prostate (HCC)   Sepsis (HCC)   Sacral decubitus ulcer, stage IV (HCC)   AKI (acute kidney injury)   Bacteremia   Urinary tract infection associated with indwelling urethral catheter  #1 sepsis secondary to polymicrobial bacteremia (Proteus and Staph epidermidis bacteremia) and ESBL E. Coli UTI, POA -Felt likely seeded from stage IV decubitus ulcer +/- urine - Patient with history of prostate cancer with chronic Foley catheter in place noted to be bedridden. - At time of admission patient noted to be hypotensive, elevated lactic acid level, leukocytosis and initially started on IV vancomycin  and IV cefepime , IV Flagyl . - Once cultures had resulted IV vancomycin  was changed to meropenem . - 2/2 blood cultures with Staph epidermidis methicillin resistance detected, Proteus, Staphylococcus  capitis. -Urine cultures with > 100,000 colonies of Proteus Mirabilis, > 100,000 colonies of ESBL E. coli - Patient seen in consultation by ID who reviewed cultures and sensitivities and recommending placing patient on Augmentin  to cover Proteus and anaerobes in the wound, while continuing vancomycin  for now but likely plan to exchange vancomycin  for Zyvox  to complete a total of 2 weeks of coverage targeting Staphylococcus species and Proteus. -Patient changed from vancomycin  to Zyvox . -Per ID patient already completed sufficient meropenem  for ESBL found in the urine. -Appreciate ID input and recommendations.  2.  Stage IV decubitus ulcer - Patient seen by wound care. - See wound care recommendations.  3.  History of prostate cancer/BPH - Patient noted to be on hospice prior to admission. - Hospice liaison discussed with family and patient to return back home with hospice following on discharge.  4.  Hypertension -Patient noted to be on Norvasc  and losartan  prior to admission which were held due to soft blood pressure. - BP currently stable. - Continue Norvasc  5 mg daily.  -Follow.  5.  Hyperlipidemia -Statin.  6.  Diabetes mellitus type 2 -Hemoglobin A1c noted at 5.1 on 11/26/2021. - CBG 93 this morning. -Discontinue SSI and CBGs.  7.  Hypokalemia -Repleted.  8.  Normocytic anemia/anemia of chronic disease - Hemoglobin currently at 6.9 from 7.3 from 7.6 from 7.5 from 7.7 from 7.6 from 7.9 from 11.2 from 9.7 on admission.  - Felt likely dilutional component. - Patient with no overt GI bleed. - FOBT pending. -Anemia panel with iron level of 19, TIBC of 137, ferritin of 618, folate of 4.1, vitamin B12 of  1070. -Transfuse 2 units PRBCs. -Folic acid  1 mg p.o. daily. - Follow H&H.  9.  AKI - Resolved with hydration.  10.  Pressure injury, POA Wound Pressure Injury Elbow Posterior;Left Stage 3 -  Full thickness tissue loss. Subcutaneous fat may be visible but bone, tendon or  muscle are NOT exposed. (Active)       DVT prophylaxis: Heparin >>>> SCDs Code Status: Full Family Communication: Updated patient.  Updated wife at bedside. Disposition: Back home with hospice once medically stable hopefully in the next 24 to 48 hours.  Status is: Inpatient Remains inpatient appropriate because: Severity of illness   Consultants:  ID: Dr.Van Dam 11/21/2024  Procedures:  Chest x-ray 11/16/2024 Transfuse 2 units PRBCs 11/23/2024  Antimicrobials:  Anti-infectives (From admission, onward)    Start     Dose/Rate Route Frequency Ordered Stop   11/22/24 1200  linezolid  (ZYVOX ) tablet 600 mg        600 mg Oral Every 12 hours 11/22/24 1109     11/21/24 1000  amoxicillin -clavulanate (AUGMENTIN ) 875-125 MG per tablet 1 tablet        1 tablet Oral Every 12 hours 11/21/24 0907     11/18/24 1600  vancomycin  (VANCOCIN ) IVPB 1000 mg/200 mL premix  Status:  Discontinued        1,000 mg 200 mL/hr over 60 Minutes Intravenous Every 48 hours 11/17/24 1407 11/18/24 0922   11/18/24 1400  meropenem  (MERREM ) 1 g in sodium chloride  0.9 % 100 mL IVPB  Status:  Discontinued        1 g 200 mL/hr over 30 Minutes Intravenous Every 8 hours 11/18/24 0922 11/21/24 0907   11/18/24 1200  vancomycin  (VANCOCIN ) IVPB 1000 mg/200 mL premix  Status:  Discontinued        1,000 mg 200 mL/hr over 60 Minutes Intravenous Every 24 hours 11/18/24 0922 11/22/24 1109   11/17/24 1600  vancomycin  (VANCOCIN ) IVPB 1000 mg/200 mL premix  Status:  Discontinued        1,000 mg 200 mL/hr over 60 Minutes Intravenous Every 48 hours 11/16/24 2130 11/17/24 1227   11/17/24 1600  vancomycin  (VANCOCIN ) IVPB 1000 mg/200 mL premix  Status:  Discontinued        1,000 mg 200 mL/hr over 60 Minutes Intravenous Every 48 hours 11/17/24 1403 11/17/24 1407   11/17/24 1400  ceFEPIme  (MAXIPIME ) 2 g in sodium chloride  0.9 % 100 mL IVPB  Status:  Discontinued        2 g 200 mL/hr over 30 Minutes Intravenous Daily 11/16/24 2052 11/17/24  1227   11/17/24 1315  cefTRIAXone  (ROCEPHIN ) 2 g in sodium chloride  0.9 % 100 mL IVPB  Status:  Discontinued        2 g 200 mL/hr over 30 Minutes Intravenous Every 24 hours 11/17/24 1227 11/18/24 0922   11/17/24 0600  metroNIDAZOLE  (FLAGYL ) IVPB 500 mg  Status:  Discontinued        500 mg 100 mL/hr over 60 Minutes Intravenous 2 times daily 11/16/24 2052 11/17/24 1227   11/16/24 2145  vancomycin  (VANCOCIN ) IVPB 1000 mg/200 mL premix  Status:  Discontinued        1,000 mg 200 mL/hr over 60 Minutes Intravenous Every 24 hours 11/16/24 2052 11/16/24 2130   11/16/24 1530  ceFEPIme  (MAXIPIME ) 2 g in sodium chloride  0.9 % 100 mL IVPB        2 g 200 mL/hr over 30 Minutes Intravenous  Once 11/16/24 1523 11/16/24 1721   11/16/24 1530  metroNIDAZOLE  (FLAGYL )  IVPB 500 mg        500 mg 100 mL/hr over 60 Minutes Intravenous  Once 11/16/24 1523 11/16/24 1735   11/16/24 1530  vancomycin  (VANCOCIN ) IVPB 1000 mg/200 mL premix        1,000 mg 200 mL/hr over 60 Minutes Intravenous  Once 11/16/24 1523 11/16/24 1822         Subjective: Patient laying in bed eating lunch.  Denies any chest pain or shortness of breath.  No abdominal pain.  Wife at bedside.  Denies any overt bleeding.  Patient hoping to be discharged today.  Objective: Vitals:   11/22/24 1154 11/22/24 1948 11/23/24 0327 11/23/24 1230  BP: 139/69 (!) 165/78 (!) 160/83 135/66  Pulse: 64 67 81 78  Resp: 16 20 20 15   Temp: 97.9 F (36.6 C) 98.4 F (36.9 C) 97.6 F (36.4 C) 98.1 F (36.7 C)  TempSrc:  Oral Oral Oral  SpO2:  100% 100% (P) 100%  Weight:      Height:        Intake/Output Summary (Last 24 hours) at 11/23/2024 1240 Last data filed at 11/23/2024 0900 Gross per 24 hour  Intake 480 ml  Output 975 ml  Net -495 ml   Filed Weights   11/16/24 1515  Weight: 62 kg    Examination:  General exam: NAD. Respiratory system: Lungs clear to auscultation bilaterally anterior lung fields.  No wheezes, no crackles, no rhonchi.   Fair air movement.  Speaking in full sentences.  Cardiovascular system: RRR no murmurs rubs or gallops.  No JVD.  No pitting lower extremity edema.   Gastrointestinal system: Abdomen is soft, nontender, nondistended, positive bowel sounds.  No rebound.  No guarding.  Central nervous system: Alert and oriented. No focal neurological deficits. Extremities: Symmetric 5 x 5 power. Skin: No rashes, lesions or ulcers Psychiatry: Judgement and insight appear poor to fair. Mood & affect appropriate.     Data Reviewed: I have personally reviewed following labs and imaging studies  CBC: Recent Labs  Lab 11/16/24 1629 11/16/24 1827 11/19/24 0450 11/20/24 0534 11/21/24 0459 11/22/24 1355 11/23/24 0901  WBC 22.9*   < > 13.0* 7.4 6.2 5.2 5.9  NEUTROABS 20.9*  --   --   --   --   --  3.4  HGB 9.7*   < > 7.7* 7.5* 7.6* 7.3* 6.9*  HCT 30.9*   < > 24.5* 25.0* 24.8* 23.4* 22.4*  MCV 96.6   < > 96.1 99.2 97.3 98.3 100.0  PLT 273   < > 222 221 235 259 279   < > = values in this interval not displayed.    Basic Metabolic Panel: Recent Labs  Lab 11/17/24 1325 11/18/24 0540 11/19/24 0450 11/20/24 0534 11/20/24 0837 11/21/24 0459 11/22/24 1355 11/23/24 0901  NA 136 138 141 141  --  141 140 141  K 3.1* 2.8* 4.0 4.0  --  3.8 3.8 3.6  CL 107 107 111 112*  --  111 109 108  CO2 23 24 24 26   --  26 27 27   GLUCOSE 107* 93 80 82  --  95 111* 89  BUN 27* 26* 18 12  --  10 9 10   CREATININE 1.15 0.87 0.72 0.53*  --  0.62 0.55* 0.68  CALCIUM  8.2* 8.1* 8.4* 8.8*  --  8.7* 8.9 8.9  MG 1.7 1.5*  --   --  1.8  --   --   --     GFR: Estimated  Creatinine Clearance: 63.5 mL/min (by C-G formula based on SCr of 0.68 mg/dL).  Liver Function Tests: Recent Labs  Lab 11/16/24 1820 11/17/24 1325  AST 41 44*  ALT 9 12  ALKPHOS 88 75  BILITOT 1.3* 0.7  PROT 6.4* 5.7*  ALBUMIN 2.5* 2.2*    CBG: Recent Labs  Lab 11/22/24 1633 11/22/24 2123 11/23/24 0805 11/23/24 0855 11/23/24 1204  GLUCAP 90  161* 67* 86 93     Recent Results (from the past 240 hours)  Resp panel by RT-PCR (RSV, Flu A&B, Covid) Anterior Nasal Swab     Status: None   Collection Time: 11/16/24  4:10 PM   Specimen: Anterior Nasal Swab  Result Value Ref Range Status   SARS Coronavirus 2 by RT PCR NEGATIVE NEGATIVE Final    Comment: (NOTE) SARS-CoV-2 target nucleic acids are NOT DETECTED.  The SARS-CoV-2 RNA is generally detectable in upper respiratory specimens during the acute phase of infection. The lowest concentration of SARS-CoV-2 viral copies this assay can detect is 138 copies/mL. A negative result does not preclude SARS-Cov-2 infection and should not be used as the sole basis for treatment or other patient management decisions. A negative result may occur with  improper specimen collection/handling, submission of specimen other than nasopharyngeal swab, presence of viral mutation(s) within the areas targeted by this assay, and inadequate number of viral copies(<138 copies/mL). A negative result must be combined with clinical observations, patient history, and epidemiological information. The expected result is Negative.  Fact Sheet for Patients:  bloggercourse.com  Fact Sheet for Healthcare Providers:  seriousbroker.it  This test is no t yet approved or cleared by the [place]  FDA and  has been authorized for detection and/or diagnosis of SARS-CoV-2 by FDA under an Emergency Use Authorization (EUA). This EUA will remain  in effect (meaning this test can be used) for the duration of the COVID-19 declaration under Section 564(b)(1) of the Act, 21 U.S.C.section 360bbb-3(b)(1), unless the authorization is terminated  or revoked sooner.       Influenza A by PCR NEGATIVE NEGATIVE Final   Influenza B by PCR NEGATIVE NEGATIVE Final    Comment: (NOTE) The Xpert Xpress SARS-CoV-2/FLU/RSV plus assay is intended as an aid in the diagnosis of  influenza from Nasopharyngeal swab specimens and should not be used as a sole basis for treatment. Nasal washings and aspirates are unacceptable for Xpert Xpress SARS-CoV-2/FLU/RSV testing.  Fact Sheet for Patients: bloggercourse.com  Fact Sheet for Healthcare Providers: seriousbroker.it  This test is not yet approved or cleared by the [place]  FDA and has been authorized for detection and/or diagnosis of SARS-CoV-2 by FDA under an Emergency Use Authorization (EUA). This EUA will remain in effect (meaning this test can be used) for the duration of the COVID-19 declaration under Section 564(b)(1) of the Act, 21 U.S.C. section 360bbb-3(b)(1), unless the authorization is terminated or revoked.     Resp Syncytial Virus by PCR NEGATIVE NEGATIVE Final    Comment: (NOTE) Fact Sheet for Patients: bloggercourse.com  Fact Sheet for Healthcare Providers: seriousbroker.it  This test is not yet approved or cleared by the [place]  FDA and has been authorized for detection and/or diagnosis of SARS-CoV-2 by FDA under an Emergency Use Authorization (EUA). This EUA will remain in effect (meaning this test can be used) for the duration of the COVID-19 declaration under Section 564(b)(1) of the Act, 21 U.S.C. section 360bbb-3(b)(1), unless the authorization is terminated or revoked.  Performed at [facility], [address], [place], [place] [address]   Blood Culture (routine x 2)     Status: Abnormal   Collection Time: 11/16/24  4:11 PM   Specimen: BLOOD LEFT WRIST  Result Value Ref Range Status   Specimen Description   Final    BLOOD LEFT WRIST Performed at [facility], [address], [place], [place] [address]    Special Requests   Final    BOTTLES DRAWN AEROBIC AND ANAEROBIC Blood Culture results may not be optimal due to an inadequate volume of blood  received in culture bottles Performed at [facility], [address], [place], [place] [address]    Culture  Setup Time   Final    GRAM NEGATIVE RODS ANAEROBIC BOTTLE ONLY CRITICAL RESULT CALLED TO, READ BACK BY AND VERIFIED WITH: PHARMD CHRISTINE SHADE [phone] AT 1120 BY EC GRAM POSITIVE COCCI AEROBIC BOTTLE ONLY CRITICAL VALUE NOTED.  VALUE IS CONSISTENT WITH PREVIOUSLY REPORTED AND CALLED VALUE. Performed at [facility], [address], [place], [place] [address]    Culture (A)  Final    PROTEUS MIRABILIS STAPHYLOCOCCUS EPIDERMIDIS STAPHYLOCOCCUS CAPITIS    Report Status 11/20/2024 FINAL  Final   Organism ID, Bacteria PROTEUS MIRABILIS  Final   Organism ID, Bacteria STAPHYLOCOCCUS EPIDERMIDIS  Final   Organism ID, Bacteria STAPHYLOCOCCUS CAPITIS  Final      Susceptibility   Proteus mirabilis - MIC*    AMPICILLIN <=2 SENSITIVE Sensitive     CEFAZOLIN  (NON-URINE) 4 INTERMEDIATE Intermediate     CEFEPIME  <=0.12 SENSITIVE Sensitive     ERTAPENEM <=0.12 SENSITIVE Sensitive     CEFTRIAXONE  <=0.25 SENSITIVE Sensitive     CIPROFLOXACIN  0.25 SENSITIVE Sensitive     GENTAMICIN <=1 SENSITIVE Sensitive     MEROPENEM  1 SENSITIVE Sensitive     TRIMETH /SULFA  <=20 SENSITIVE Sensitive     AMPICILLIN/SULBACTAM <=2 SENSITIVE Sensitive     PIP/TAZO Value in next row Sensitive      <=4 SENSITIVEThis is a modified FDA-approved test that has been validated and its performance characteristics determined by the reporting laboratory.  This laboratory is certified under the Clinical Laboratory Improvement Amendments CLIA as qualified to perform high complexity clinical laboratory testing.    * PROTEUS MIRABILIS   Staphylococcus capitis - MIC*    CIPROFLOXACIN  Value in next row Sensitive      <=4 SENSITIVEThis is a modified FDA-approved test that has been validated and its performance characteristics determined by the reporting laboratory.  This laboratory is certified under  the Clinical Laboratory Improvement Amendments CLIA as qualified to perform high complexity clinical laboratory testing.    ERYTHROMYCIN Value in next row Resistant      <=4 SENSITIVEThis is a modified FDA-approved test that has been validated and its performance characteristics determined by the reporting laboratory.  This laboratory is certified under the Clinical Laboratory Improvement Amendments CLIA as qualified to perform high complexity clinical laboratory testing.    GENTAMICIN Value in next row Sensitive      <=4 SENSITIVEThis is a modified FDA-approved test that has been validated and its performance characteristics determined by the reporting laboratory.  This laboratory is certified under the Clinical Laboratory Improvement Amendments CLIA as qualified to perform high complexity clinical laboratory testing.    OXACILLIN Value in next row Sensitive      <=4 SENSITIVEThis is a modified FDA-approved test that has been validated and its performance characteristics determined by the reporting laboratory.  This laboratory is certified under the  Clinical Laboratory Improvement Amendments CLIA as qualified to perform high complexity clinical laboratory testing.    TETRACYCLINE Value in next row Sensitive      <=4 SENSITIVEThis is a modified FDA-approved test that has been validated and its performance characteristics determined by the reporting laboratory.  This laboratory is certified under the Clinical Laboratory Improvement Amendments CLIA as qualified to perform high complexity clinical laboratory testing.    VANCOMYCIN  Value in next row Sensitive      <=4 SENSITIVEThis is a modified FDA-approved test that has been validated and its performance characteristics determined by the reporting laboratory.  This laboratory is certified under the Clinical Laboratory Improvement Amendments CLIA as qualified to perform high complexity clinical laboratory testing.    TRIMETH /SULFA  Value in next row Sensitive       <=4 SENSITIVEThis is a modified FDA-approved test that has been validated and its performance characteristics determined by the reporting laboratory.  This laboratory is certified under the Clinical Laboratory Improvement Amendments CLIA as qualified to perform high complexity clinical laboratory testing.    CLINDAMYCIN Value in next row Intermediate      <=4 SENSITIVEThis is a modified FDA-approved test that has been validated and its performance characteristics determined by the reporting laboratory.  This laboratory is certified under the Clinical Laboratory Improvement Amendments CLIA as qualified to perform high complexity clinical laboratory testing.    RIFAMPIN Value in next row Sensitive      <=4 SENSITIVEThis is a modified FDA-approved test that has been validated and its performance characteristics determined by the reporting laboratory.  This laboratory is certified under the Clinical Laboratory Improvement Amendments CLIA as qualified to perform high complexity clinical laboratory testing.    Inducible Clindamycin Value in next row Sensitive      <=4 SENSITIVEThis is a modified FDA-approved test that has been validated and its performance characteristics determined by the reporting laboratory.  This laboratory is certified under the Clinical Laboratory Improvement Amendments CLIA as qualified to perform high complexity clinical laboratory testing.    * STAPHYLOCOCCUS CAPITIS   Staphylococcus epidermidis - MIC*    CIPROFLOXACIN  Value in next row Resistant      <=4 SENSITIVEThis is a modified FDA-approved test that has been validated and its performance characteristics determined by the reporting laboratory.  This laboratory is certified under the Clinical Laboratory Improvement Amendments CLIA as qualified to perform high complexity clinical laboratory testing.    ERYTHROMYCIN Value in next row Resistant      <=4 SENSITIVEThis is a modified FDA-approved test that has been validated and  its performance characteristics determined by the reporting laboratory.  This laboratory is certified under the Clinical Laboratory Improvement Amendments CLIA as qualified to perform high complexity clinical laboratory testing.    GENTAMICIN Value in next row Sensitive      <=4 SENSITIVEThis is a modified FDA-approved test that has been validated and its performance characteristics determined by the reporting laboratory.  This laboratory is certified under the Clinical Laboratory Improvement Amendments CLIA as qualified to perform high complexity clinical laboratory testing.    OXACILLIN Value in next row Resistant      <=4 SENSITIVEThis is a modified FDA-approved test that has been validated and its performance characteristics determined by the reporting laboratory.  This laboratory is certified under the Clinical Laboratory Improvement Amendments CLIA as qualified to perform high complexity clinical laboratory testing.    TETRACYCLINE Value in next row Sensitive      <=4 SENSITIVEThis is a modified FDA-approved test  that has been validated and its performance characteristics determined by the reporting laboratory.  This laboratory is certified under the Clinical Laboratory Improvement Amendments CLIA as qualified to perform high complexity clinical laboratory testing.    VANCOMYCIN  Value in next row Sensitive      <=4 SENSITIVEThis is a modified FDA-approved test that has been validated and its performance characteristics determined by the reporting laboratory.  This laboratory is certified under the Clinical Laboratory Improvement Amendments CLIA as qualified to perform high complexity clinical laboratory testing.    TRIMETH /SULFA  Value in next row Sensitive      <=4 SENSITIVEThis is a modified FDA-approved test that has been validated and its performance characteristics determined by the reporting laboratory.  This laboratory is certified under the Clinical Laboratory Improvement Amendments CLIA as  qualified to perform high complexity clinical laboratory testing.    CLINDAMYCIN Value in next row Sensitive      <=4 SENSITIVEThis is a modified FDA-approved test that has been validated and its performance characteristics determined by the reporting laboratory.  This laboratory is certified under the Clinical Laboratory Improvement Amendments CLIA as qualified to perform high complexity clinical laboratory testing.    RIFAMPIN Value in next row Sensitive      <=4 SENSITIVEThis is a modified FDA-approved test that has been validated and its performance characteristics determined by the reporting laboratory.  This laboratory is certified under the Clinical Laboratory Improvement Amendments CLIA as qualified to perform high complexity clinical laboratory testing.    Inducible Clindamycin Value in next row Sensitive      <=4 SENSITIVEThis is a modified FDA-approved test that has been validated and its performance characteristics determined by the reporting laboratory.  This laboratory is certified under the Clinical Laboratory Improvement Amendments CLIA as qualified to perform high complexity clinical laboratory testing.    * STAPHYLOCOCCUS EPIDERMIDIS  Blood Culture ID Panel (Reflexed)     Status: Abnormal   Collection Time: 11/16/24  4:11 PM  Result Value Ref Range Status   Enterococcus faecalis NOT DETECTED NOT DETECTED Final   Enterococcus Faecium NOT DETECTED NOT DETECTED Final   Listeria monocytogenes NOT DETECTED NOT DETECTED Final   Staphylococcus species DETECTED (A) NOT DETECTED Final    Comment: CRITICAL RESULT CALLED TO, READ BACK BY AND VERIFIED WITH: PHARMD CHRISTINE SHADE [id number] AT 1120 BY EC    Staphylococcus aureus (BCID) NOT DETECTED NOT DETECTED Final   Staphylococcus epidermidis DETECTED (A) NOT DETECTED Final    Comment: Methicillin (oxacillin) resistant coagulase negative staphylococcus. Possible blood culture contaminant (unless isolated from more than one blood culture  draw or clinical case suggests pathogenicity). No antibiotic treatment is indicated for blood  culture contaminants. CRITICAL RESULT CALLED TO, READ BACK BY AND VERIFIED WITH: PHARMD CHRISTINE SHADE [id number] AT 1120 BY EC    Staphylococcus lugdunensis NOT DETECTED NOT DETECTED Final   Streptococcus species NOT DETECTED NOT DETECTED Final   Streptococcus agalactiae NOT DETECTED NOT DETECTED Final   Streptococcus pneumoniae NOT DETECTED NOT DETECTED Final   Streptococcus pyogenes NOT DETECTED NOT DETECTED Final   A.calcoaceticus-baumannii NOT DETECTED NOT DETECTED Final   Bacteroides fragilis NOT DETECTED NOT DETECTED Final   Enterobacterales DETECTED (A) NOT DETECTED Final    Comment: Enterobacterales represent a large order of gram negative bacteria, not a single organism. CRITICAL RESULT CALLED TO, READ BACK BY AND VERIFIED WITH: PHARMD CHRISTINE SHADE [id number] AT 1120 BY EC    Enterobacter cloacae complex NOT DETECTED NOT DETECTED Final   Escherichia coli NOT  DETECTED NOT DETECTED Final   Klebsiella aerogenes NOT DETECTED NOT DETECTED Final   Klebsiella oxytoca NOT DETECTED NOT DETECTED Final   Klebsiella pneumoniae NOT DETECTED NOT DETECTED Final   Proteus species DETECTED (A) NOT DETECTED Final    Comment: CRITICAL RESULT CALLED TO, READ BACK BY AND VERIFIED WITH: PHARMD CHRISTINE SHADE [id number] AT 1120 BY EC    Salmonella species NOT DETECTED NOT DETECTED Final   Serratia marcescens NOT DETECTED NOT DETECTED Final   Haemophilus influenzae NOT DETECTED NOT DETECTED Final   Neisseria meningitidis NOT DETECTED NOT DETECTED Final   Pseudomonas aeruginosa NOT DETECTED NOT DETECTED Final   Stenotrophomonas maltophilia NOT DETECTED NOT DETECTED Final   Candida albicans NOT DETECTED NOT DETECTED Final   Candida auris NOT DETECTED NOT DETECTED Final   Candida glabrata NOT DETECTED NOT DETECTED Final   Candida krusei NOT DETECTED NOT DETECTED Final   Candida parapsilosis NOT DETECTED  NOT DETECTED Final   Candida tropicalis NOT DETECTED NOT DETECTED Final   Cryptococcus neoformans/gattii NOT DETECTED NOT DETECTED Final   CTX-M ESBL NOT DETECTED NOT DETECTED Final   Carbapenem resistance IMP NOT DETECTED NOT DETECTED Final   Carbapenem resistance KPC NOT DETECTED NOT DETECTED Final   Methicillin resistance mecA/C DETECTED (A) NOT DETECTED Final    Comment: CRITICAL RESULT CALLED TO, READ BACK BY AND VERIFIED WITH: PHARMD CHRISTINE SHADE [id number] AT 1120 BY EC    Carbapenem resistance NDM NOT DETECTED NOT DETECTED Final   Carbapenem resist OXA 48 LIKE NOT DETECTED NOT DETECTED Final   Carbapenem resistance VIM NOT DETECTED NOT DETECTED Final    Comment: Performed at [facility], [address] [place], [place] [address]  Blood Culture (routine x 2)     Status: Abnormal   Collection Time: 11/16/24  4:16 PM   Specimen: BLOOD RIGHT WRIST  Result Value Ref Range Status   Specimen Description   Final    BLOOD RIGHT WRIST Performed at [facility], [address], [place], [place] [address]    Special Requests   Final    BOTTLES DRAWN AEROBIC AND ANAEROBIC Blood Culture results may not be optimal due to an inadequate volume of blood received in culture bottles Performed at [facility], [address], [place], [place] [address]    Culture  Setup Time   Final    GRAM POSITIVE COCCI AEROBIC BOTTLE ONLY CRITICAL RESULT CALLED TO, READ BACK BY AND VERIFIED WITH: PHARMD CRYSTAL ROBERTSON [id number] AT 1402 BY EC GRAM NEGATIVE RODS ANAEROBIC BOTTLE ONLY CRITICAL VALUE NOTED.  VALUE IS CONSISTENT WITH PREVIOUSLY REPORTED AND CALLED VALUE.    Culture (A)  Final    STAPHYLOCOCCUS CAPITIS STAPHYLOCOCCUS EPIDERMIDIS PROTEUS MIRABILIS SUSCEPTIBILITIES PERFORMED ON PREVIOUS CULTURE WITHIN THE LAST 5 DAYS. Performed at [facility], [address], [place], [place] [address]    Report Status 11/23/2024 FINAL  Final   Organism ID, Bacteria  STAPHYLOCOCCUS EPIDERMIDIS  Final   Organism ID, Bacteria STAPHYLOCOCCUS CAPITIS  Final      Susceptibility   Staphylococcus capitis - MIC*    CIPROFLOXACIN  <=0.5 SENSITIVE Sensitive     ERYTHROMYCIN >=8 RESISTANT Resistant     GENTAMICIN <=0.5 SENSITIVE Sensitive     OXACILLIN <=0.25 SENSITIVE Sensitive     TETRACYCLINE <=1 SENSITIVE Sensitive     VANCOMYCIN  <=0.5 SENSITIVE Sensitive     TRIMETH /SULFA  <=10 SENSITIVE Sensitive     CLINDAMYCIN INTERMEDIATE Intermediate     RIFAMPIN <=0.5  SENSITIVE Sensitive     Inducible Clindamycin NEGATIVE Sensitive     * STAPHYLOCOCCUS CAPITIS   Staphylococcus epidermidis - MIC*    CIPROFLOXACIN  >=8 RESISTANT Resistant     ERYTHROMYCIN >=8 RESISTANT Resistant     GENTAMICIN <=0.5 SENSITIVE Sensitive     OXACILLIN >=4 RESISTANT Resistant     TETRACYCLINE 2 SENSITIVE Sensitive     VANCOMYCIN  2 SENSITIVE Sensitive     TRIMETH /SULFA  <=10 SENSITIVE Sensitive     CLINDAMYCIN <=0.25 SENSITIVE Sensitive     RIFAMPIN <=0.5 SENSITIVE Sensitive     Inducible Clindamycin NEGATIVE Sensitive     * STAPHYLOCOCCUS EPIDERMIDIS  Urine Culture     Status: Abnormal   Collection Time: 11/16/24  4:39 PM   Specimen: Urine, Random  Result Value Ref Range Status   Specimen Description   Final    URINE, RANDOM Performed at [facility], [address], [place], [place] [address]    Special Requests   Final    NONE Reflexed from [id number] Performed at [facility], [address] [place], [place] [address]    Culture (A)  Final    >=100,000 COLONIES/mL PROTEUS MIRABILIS >=100,000 COLONIES/mL ESCHERICHIA COLI Confirmed Extended Spectrum Beta-Lactamase Producer (ESBL).  In bloodstream infections from ESBL organisms, carbapenems are preferred over piperacillin/tazobactam. They are shown to have a lower risk of mortality.    Report Status 11/18/2024 FINAL  Final   Organism ID, Bacteria PROTEUS MIRABILIS (A)  Final   Organism ID,  Bacteria ESCHERICHIA COLI (A)  Final      Susceptibility   Escherichia coli - MIC*    AMPICILLIN >=32 RESISTANT Resistant     CEFAZOLIN  (URINE) Value in next row Resistant      >=32 RESISTANTThis is a modified FDA-approved test that has been validated and its performance characteristics determined by the reporting laboratory.  This laboratory is certified under the Clinical Laboratory Improvement Amendments CLIA as qualified to perform high complexity clinical laboratory testing.    CEFEPIME  Value in next row Intermediate      >=32 RESISTANTThis is a modified FDA-approved test that has been validated and its performance characteristics determined by the reporting laboratory.  This laboratory is certified under the Clinical Laboratory Improvement Amendments CLIA as qualified to perform high complexity clinical laboratory testing.    ERTAPENEM Value in next row Sensitive      >=32 RESISTANTThis is a modified FDA-approved test that has been validated and its performance characteristics determined by the reporting laboratory.  This laboratory is certified under the Clinical Laboratory Improvement Amendments CLIA as qualified to perform high complexity clinical laboratory testing.    CEFTRIAXONE  Value in next row Resistant      >=32 RESISTANTThis is a modified FDA-approved test that has been validated and its performance characteristics determined by the reporting laboratory.  This laboratory is certified under the Clinical Laboratory Improvement Amendments CLIA as qualified to perform high complexity clinical laboratory testing.    CIPROFLOXACIN  Value in next row Resistant      >=32 RESISTANTThis is a modified FDA-approved test that has been validated and its performance characteristics determined by the reporting laboratory.  This laboratory is certified under the Clinical Laboratory Improvement Amendments CLIA as qualified to perform high complexity clinical laboratory testing.    GENTAMICIN Value in  next row Sensitive      >=32 RESISTANTThis is a modified FDA-approved test that has been validated and its performance characteristics determined by the reporting laboratory.  This laboratory  is certified under the Clinical Laboratory Improvement Amendments CLIA as qualified to perform high complexity clinical laboratory testing.    NITROFURANTOIN Value in next row Sensitive      >=32 RESISTANTThis is a modified FDA-approved test that has been validated and its performance characteristics determined by the reporting laboratory.  This laboratory is certified under the Clinical Laboratory Improvement Amendments CLIA as qualified to perform high complexity clinical laboratory testing.    TRIMETH /SULFA  Value in next row Resistant      >=32 RESISTANTThis is a modified FDA-approved test that has been validated and its performance characteristics determined by the reporting laboratory.  This laboratory is certified under the Clinical Laboratory Improvement Amendments CLIA as qualified to perform high complexity clinical laboratory testing.    AMPICILLIN/SULBACTAM Value in next row Resistant      >=32 RESISTANTThis is a modified FDA-approved test that has been validated and its performance characteristics determined by the reporting laboratory.  This laboratory is certified under the Clinical Laboratory Improvement Amendments CLIA as qualified to perform high complexity clinical laboratory testing.    PIP/TAZO Value in next row Sensitive      <=4 SENSITIVEThis is a modified FDA-approved test that has been validated and its performance characteristics determined by the reporting laboratory.  This laboratory is certified under the Clinical Laboratory Improvement Amendments CLIA as qualified to perform high complexity clinical laboratory testing.    MEROPENEM  Value in next row Sensitive      <=4 SENSITIVEThis is a modified FDA-approved test that has been validated and its performance characteristics determined by  the reporting laboratory.  This laboratory is certified under the Clinical Laboratory Improvement Amendments CLIA as qualified to perform high complexity clinical laboratory testing.    * >=100,000 COLONIES/mL ESCHERICHIA COLI   Proteus mirabilis - MIC*    AMPICILLIN Value in next row Sensitive      <=4 SENSITIVEThis is a modified FDA-approved test that has been validated and its performance characteristics determined by the reporting laboratory.  This laboratory is certified under the Clinical Laboratory Improvement Amendments CLIA as qualified to perform high complexity clinical laboratory testing.    CEFAZOLIN  (URINE) Value in next row Sensitive      4 SENSITIVEThis is a modified FDA-approved test that has been validated and its performance characteristics determined by the reporting laboratory.  This laboratory is certified under the Clinical Laboratory Improvement Amendments CLIA as qualified to perform high complexity clinical laboratory testing.    CEFEPIME  Value in next row Sensitive      4 SENSITIVEThis is a modified FDA-approved test that has been validated and its performance characteristics determined by the reporting laboratory.  This laboratory is certified under the Clinical Laboratory Improvement Amendments CLIA as qualified to perform high complexity clinical laboratory testing.    ERTAPENEM Value in next row Sensitive      4 SENSITIVEThis is a modified FDA-approved test that has been validated and its performance characteristics determined by the reporting laboratory.  This laboratory is certified under the Clinical Laboratory Improvement Amendments CLIA as qualified to perform high complexity clinical laboratory testing.    CEFTRIAXONE  Value in next row Sensitive      4 SENSITIVEThis is a modified FDA-approved test that has been validated and its performance characteristics determined by the reporting laboratory.  This laboratory is certified under the Clinical Laboratory Improvement  Amendments CLIA as qualified to perform high complexity clinical laboratory testing.    CIPROFLOXACIN  Value in next row Sensitive      4  SENSITIVEThis is a modified FDA-approved test that has been validated and its performance characteristics determined by the reporting laboratory.  This laboratory is certified under the Clinical Laboratory Improvement Amendments CLIA as qualified to perform high complexity clinical laboratory testing.    GENTAMICIN Value in next row Sensitive      4 SENSITIVEThis is a modified FDA-approved test that has been validated and its performance characteristics determined by the reporting laboratory.  This laboratory is certified under the Clinical Laboratory Improvement Amendments CLIA as qualified to perform high complexity clinical laboratory testing.    NITROFURANTOIN Value in next row Resistant      4 SENSITIVEThis is a modified FDA-approved test that has been validated and its performance characteristics determined by the reporting laboratory.  This laboratory is certified under the Clinical Laboratory Improvement Amendments CLIA as qualified to perform high complexity clinical laboratory testing.    TRIMETH /SULFA  Value in next row Sensitive      4 SENSITIVEThis is a modified FDA-approved test that has been validated and its performance characteristics determined by the reporting laboratory.  This laboratory is certified under the Clinical Laboratory Improvement Amendments CLIA as qualified to perform high complexity clinical laboratory testing.    AMPICILLIN/SULBACTAM Value in next row Sensitive      4 SENSITIVEThis is a modified FDA-approved test that has been validated and its performance characteristics determined by the reporting laboratory.  This laboratory is certified under the Clinical Laboratory Improvement Amendments CLIA as qualified to perform high complexity clinical laboratory testing.    PIP/TAZO Value in next row Sensitive      <=4 SENSITIVEThis is a  modified FDA-approved test that has been validated and its performance characteristics determined by the reporting laboratory.  This laboratory is certified under the Clinical Laboratory Improvement Amendments CLIA as qualified to perform high complexity clinical laboratory testing.    MEROPENEM  Value in next row Sensitive      <=4 SENSITIVEThis is a modified FDA-approved test that has been validated and its performance characteristics determined by the reporting laboratory.  This laboratory is certified under the Clinical Laboratory Improvement Amendments CLIA as qualified to perform high complexity clinical laboratory testing.    * >=100,000 COLONIES/mL PROTEUS MIRABILIS  Culture, blood (Routine X 2) w Reflex to ID Panel     Status: None (Preliminary result)   Collection Time: 11/20/24  9:13 AM   Specimen: BLOOD LEFT ARM  Result Value Ref Range Status   Specimen Description   Final    BLOOD LEFT ARM Performed at [facility], [address], [place], [place] [address]    Special Requests   Final    BOTTLES DRAWN AEROBIC ONLY Blood Culture results may not be optimal due to an inadequate volume of blood received in culture bottles Performed at [facility], [address], [place], [place] [address]    Culture   Final    NO GROWTH 3 DAYS Performed at [facility], [address], [place], [place] [address]    Report Status PENDING  Incomplete  Culture, blood (Routine X 2) w Reflex to ID Panel     Status: None (Preliminary result)   Collection Time: 11/20/24  9:13 AM   Specimen: BLOOD LEFT HAND  Result Value Ref Range Status   Specimen Description   Final    BLOOD LEFT HAND Performed at [facility], [address], [place], [place] [address]    Special Requests   Final  BOTTLES DRAWN AEROBIC ONLY Blood Culture results may not be optimal due to an inadequate volume of blood received in culture bottles Performed at [facility],  [address], [place], [place] [address]    Culture   Final    NO GROWTH 3 DAYS Performed at [facility], [address], [place], [place] [address]    Report Status PENDING  Incomplete         Radiology Studies: No results found.      Scheduled Meds:  amLODipine   5 mg Oral Daily   amoxicillin -clavulanate  1 tablet Oral Q12H   aspirin  EC  81 mg Oral Daily   Chlorhexidine  Gluconate Cloth  6 each Topical Daily   furosemide   20 mg Intravenous Once   heparin   5,000 Units Subcutaneous Q8H   insulin  aspart  0-6 Units Subcutaneous TID WC   linezolid   600 mg Oral Q12H   tamsulosin   0.4 mg Oral Daily   Continuous Infusions:     LOS: 7 days    Time spent: 35 minutes.    Toribio Hummer, MD [facility]   To contact the attending provider between 7A-7P or the covering provider during after hours 7P-7A, please log into the web site [email or web address] and access using universal [place] password for that web site. If you do not have the password, please call the hospital operator.  11/23/2024, 12:40 PM

## 2024-11-23 NOTE — Progress Notes (Signed)
 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 Family alerted [facility] that patient was not responding. Home visit completed by nurse. Patient was unresponsive and hypotensive. Family requested transfer to ED for evaluation. Patient was transported to ED from home. Patient is admitted for sepsis. Per Dr. Odella Pepper with [facility] this is a related hospital admission. Patient is a Full Code.   Patient's hemoglobin was 6.9 this morning and 2U PRBC have been ordered. Spoke with daughter [name] who is going to be coming to the hospital shortly to pick up her Mom who is visiting the patient. Went to the room and met Mrs. Taillon. Mr. Bethel seemed to be lethargic, but in no distress. He was receiving the blood transfusion. Mrs. Ruffini said that she had spoken with the MD and said that the pt will likely be dc home tomorrow.      Per chart review, patient remains GIP appropriate for abnormal labs, PRBC transfusion. MD believes he will discharge tomorrow.     Vital Signs: 98.7/78,19  124/63, 100% 2L [place]   I/O: 240/not documented   Abnormal Labs: RBC 2.4 (L) Hgb 6.9 (L) Hct 22.4 (L) RDW 16.4 (H) Iron 19 (L) Folate 4.1 (L) Diagnostics: none new  IV/PRN Meds:   Tylenol  1,000mg  Q6H PRN x0   Assessment and Plan per  Dr. Toribio Guiles note on  12.04.25: Principal Problem:   Polymicrobial bacterial infection Active Problems:   Type II diabetes mellitus, well controlled (HCC)   Hyperlipidemia   Essential hypertension   BPH with obstruction/lower urinary tract symptoms   Malignant neoplasm of prostate (HCC)   Sepsis (HCC)   Sacral decubitus ulcer, stage IV (HCC)   AKI (acute kidney injury)   Bacteremia   Urinary tract infection associated with indwelling urethral catheter   #1 sepsis secondary to polymicrobial bacteremia (Proteus and  Staph epidermidis bacteremia) and ESBL E. Coli UTI, POA -Felt likely seeded from stage IV decubitus ulcer +/- urine - Patient with history of prostate cancer with chronic Foley catheter in place noted to be bedridden. - At time of admission patient noted to be hypotensive, elevated lactic acid level, leukocytosis and initially started on IV vancomycin  and IV cefepime , IV Flagyl . - Once cultures had resulted IV vancomycin  was changed to meropenem . - 2/2 blood cultures with Staph epidermidis methicillin resistance detected, Proteus, Staphylococcus capitis. -Urine cultures with > 100,000 colonies of Proteus Mirabilis, > 100,000 colonies of ESBL E. coli - Patient seen in consultation by ID who reviewed cultures and sensitivities and recommending placing patient on Augmentin  to cover Proteus and anaerobes in the wound, while continuing vancomycin  for now but likely plan to exchange vancomycin  for Zyvox  to complete a total of 2 weeks of coverage targeting Staphylococcus species and Proteus. -Per ID patient already completed sufficient meropenem  for ESBL found in the urine. -Appreciate ID input and recommendations.   Normocytic anemia/anemia of chronic disease - Hemoglobin currently at 7.3 from 7.6 from 7.5 from 7.7 from 7.6 from 7.9 from 11.2 on admission. - Felt likely dilutional component. - Patient with no overt GI bleed. - FOBT pending. - Follow H&H.   Discharge Planning: ongoing   Family Contact:  Met with patient's wife at the bedside and spoke with daughter [name] by phone.   IDT: updated   Goals of Care: Full Code    Should patient need ambulance transfer at discharge-  please use [facility]) as they contract this service for our active hospice patients.    Please call with any hospice related questions or concerns.   Greig Basket, BSN, RN [facility] Liaison [phone]

## 2024-11-23 NOTE — TOC Progression Note (Signed)
 Transition of Care [facility]) - Progression Note    Patient Details  Name: Tanner Griffin MRN: [id number] Date of Birth: 04/10/1941  [facility] CM/SW Contact  Sonda Manuella Quill, RN Phone Number: 11/23/2024, 6:07 PM  Clinical Narrative:    Pt not medically ready for d/c; IP CM is following.   Expected Discharge Plan: Home w Hospice Care Barriers to Discharge: Continued Medical Work up               Expected Discharge Plan and Services In-house Referral: Clinical Social Work Discharge Planning Services: NA Post Acute Care Choice: Hospice Living arrangements for the past 2 months: Single Family Home                 DME Arranged: N/A DME Agency: NA       HH Arranged: NA HH Agency: NA         Social Drivers of Health (SDOH) Interventions SDOH Screenings   Food Insecurity: No Food Insecurity (11/16/2024)  Housing: Low Risk  (11/16/2024)  Transportation Needs: No Transportation Needs (11/16/2024)  Utilities: Not At Risk (11/16/2024)  Alcohol Screen: Low Risk  (10/16/2020)  Depression (PHQ2-9): Low Risk  (02/18/2022)  Financial Resource Strain: Low Risk  (10/16/2020)  Physical Activity: Inactive (04/30/2020)  Social Connections: Socially Isolated (11/16/2024)  Stress: No Stress Concern Present (10/16/2020)  Tobacco Use: Low Risk  (11/16/2024)    Readmission Risk Interventions    11/18/2024    2:47 PM  Readmission Risk Prevention Plan  Transportation Screening Complete  PCP or Specialist Appt within 3-5 Days Complete  HRI or Home Care Consult Complete  Social Work Consult for Recovery Care Planning/Counseling Complete  Palliative Care Screening Complete  Medication Review Oceanographer) Complete

## 2024-11-24 ENCOUNTER — Other Ambulatory Visit (HOSPITAL_COMMUNITY): Payer: Self-pay

## 2024-11-24 DIAGNOSIS — I10 Essential (primary) hypertension: Secondary | ICD-10-CM | POA: Diagnosis not present

## 2024-11-24 DIAGNOSIS — T83511S Infection and inflammatory reaction due to indwelling urethral catheter, sequela: Secondary | ICD-10-CM

## 2024-11-24 DIAGNOSIS — L89154 Pressure ulcer of sacral region, stage 4: Secondary | ICD-10-CM

## 2024-11-24 DIAGNOSIS — E7849 Other hyperlipidemia: Secondary | ICD-10-CM

## 2024-11-24 DIAGNOSIS — E119 Type 2 diabetes mellitus without complications: Secondary | ICD-10-CM | POA: Diagnosis not present

## 2024-11-24 DIAGNOSIS — N179 Acute kidney failure, unspecified: Secondary | ICD-10-CM | POA: Diagnosis not present

## 2024-11-24 DIAGNOSIS — R7881 Bacteremia: Secondary | ICD-10-CM | POA: Diagnosis not present

## 2024-11-24 LAB — CBC
HCT: 30.3 % — ABNORMAL LOW (ref 39.0–52.0)
Hemoglobin: 9.9 g/dL — ABNORMAL LOW (ref 13.0–17.0)
MCH: 30.7 pg (ref 26.0–34.0)
MCHC: 32.7 g/dL (ref 30.0–36.0)
MCV: 93.8 fL (ref 80.0–100.0)
Platelets: 229 K/uL (ref 150–400)
RBC: 3.23 MIL/uL — ABNORMAL LOW (ref 4.22–5.81)
RDW: 16.8 % — ABNORMAL HIGH (ref 11.5–15.5)
WBC: 7.6 K/uL (ref 4.0–10.5)
nRBC: 0 % (ref 0.0–0.2)

## 2024-11-24 LAB — GLUCOSE, CAPILLARY
Glucose-Capillary: 75 mg/dL (ref 70–99)
Glucose-Capillary: 91 mg/dL (ref 70–99)

## 2024-11-24 MED ORDER — AMOXICILLIN-POT CLAVULANATE 875-125 MG PO TABS
1.0000 | ORAL_TABLET | Freq: Two times a day (BID) | ORAL | 0 refills | Status: AC
  Filled 2024-11-24: qty 14, 7d supply, fill #0

## 2024-11-24 MED ORDER — TAMSULOSIN HCL 0.4 MG PO CAPS
0.4000 mg | ORAL_CAPSULE | Freq: Every day | ORAL | 1 refills | Status: AC
  Filled 2024-11-24: qty 30, 30d supply, fill #0

## 2024-11-24 MED ORDER — LINEZOLID 600 MG PO TABS
600.0000 mg | ORAL_TABLET | Freq: Two times a day (BID) | ORAL | 0 refills | Status: AC
  Filled 2024-11-24: qty 14, 7d supply, fill #0

## 2024-11-24 MED ORDER — FOLIC ACID 1 MG PO TABS: 1.0000 mg | ORAL_TABLET | Freq: Every day | ORAL | Status: AC

## 2024-11-24 MED ORDER — AMLODIPINE BESYLATE 5 MG PO TABS
5.0000 mg | ORAL_TABLET | Freq: Every day | ORAL | 1 refills | Status: AC
  Filled 2024-11-24: qty 30, 30d supply, fill #0

## 2024-11-24 NOTE — Plan of Care (Signed)

## 2024-11-24 NOTE — TOC Transition Note (Signed)
 Transition of Care [facility]) - Discharge Note   Patient Details  Name: Tanner Griffin MRN: [id number] Date of Birth: [birth year]  Transition of Care [employer]) CM/SW Contact:  Sonda Manuella Quill, RN Phone Number: 11/24/2024, 1:08 PM   Clinical Narrative:    D/C orders received; pt home w/ [facility]; transport by [facility]; spoke w/ dtr [name] Makos [phone]); she agreed to d/c plan; d/c address verified: [address] [place] [phone]; DONALDA called for transport at 1309; spoke w. Operator # [phone]; he verbalized understanding this is [facility] hospice pt and transport by [facility]; no IP CM needs.   Final next level of care: Home w Hospice Care Barriers to Discharge: No Barriers Identified   Patient Goals and CMS Choice Patient states their goals for this hospitalization and ongoing recovery are:: To return home with hospice          Discharge Placement                  Name of family member notified: [name] Makos (dtr) [phone] Patient and family notified of of transfer: 11/24/24  Discharge Plan and Services Additional resources added to the After Visit Summary for   In-house Referral: Clinical Social Work Discharge Planning Services: NA Post Acute Care Choice: Hospice          DME Arranged: N/A DME Agency: NA       HH Arranged: NA HH Agency: NA        Social Drivers of Health (SDOH) Interventions SDOH Screenings   Food Insecurity: No Food Insecurity (11/16/2024)  Housing: Low Risk  (11/16/2024)  Transportation Needs: No Transportation Needs (11/16/2024)  Utilities: Not At Risk (11/16/2024)  Alcohol Screen: Low Risk  (10/16/2020)  Depression (PHQ2-9): Low Risk  (02/18/2022)  Financial Resource Strain: Low Risk  (10/16/2020)  Physical Activity: Inactive (04/30/2020)  Social Connections: Socially Isolated (11/16/2024)  Stress: No Stress Concern Present (10/16/2020)  Tobacco Use: Low Risk  (11/16/2024)     Readmission Risk Interventions     11/18/2024    2:47 PM  Readmission Risk Prevention Plan  Transportation Screening Complete  PCP or Specialist Appt within 3-5 Days Complete  HRI or Home Care Consult Complete  Social Work Consult for Recovery Care Planning/Counseling Complete  Palliative Care Screening Complete  Medication Review Oceanographer) Complete

## 2024-11-24 NOTE — Progress Notes (Signed)
 Discharge meds in a secure bag delivered to patient's nurse - patient will leave via [facility]

## 2024-11-24 NOTE — Discharge Summary (Signed)
 Physician Discharge Summary  Tanner Griffin FMW:[id number] DOB: 1941/08/13 DOA: 11/16/2024  PCP: Haze Kingfisher, MD  Admit date: 11/16/2024 Discharge date: 11/24/2024  Time spent: 60 minutes  Recommendations for Outpatient Follow-up:  Follow-up with Haze Kingfisher, MD in 2 weeks.  On follow-up patient needs CBC done to follow-up on counts.  Patient needs a basic metabolic profile done to follow-up on electrolytes and renal function.  Patient's blood pressure will need to be reassessed on follow-up.    Discharge Diagnoses:  Principal Problem:   Polymicrobial bacterial infection Active Problems:   Type II diabetes mellitus, well controlled (HCC)   Hyperlipidemia   Essential hypertension   BPH with obstruction/lower urinary tract symptoms   Malignant neoplasm of prostate (HCC)   Sepsis (HCC)   Sacral decubitus ulcer, stage IV (HCC)   AKI (acute kidney injury)   Bacteremia   Urinary tract infection associated with indwelling urethral catheter   Discharge Condition: Stable and improved.  Diet recommendation: Regular  Filed Weights   11/16/24 1515  Weight: 62 kg    History of present illness:  HPI per Dr. Fredirick Elsie VEAR Ada is a 83 y.o. male with medical history significant of prostate cancer, HTN who has been previously on hospice care and has an indwelling catheter due to prostate issues who is brought in today by his wife because she does not think he looks well.  She would also like him to be a full code.  In the ED he was noted to be febrile 100.8 with a very dirty urine and a catheter look like it had not been changed for months.  He had an elevated lactic acid from 2.3 up to 2.9 he was fluid resuscitated and started on Vanco and cefepime  and Flagyl .  He was also noted to have a stage IV decubitus ulcer.  Patient is he is unable to give much history and history was obtained by talking to his wife who also suffers from dementia as well as the wife's sister.    Hospital Course:  #1 sepsis secondary to polymicrobial bacteremia (Proteus and Staph epidermidis bacteremia) and ESBL E. Coli UTI, POA -Felt likely seeded from stage IV decubitus ulcer +/- urine - Patient with history of prostate cancer with chronic Foley catheter in place noted to be bedridden. - At time of admission patient noted to be hypotensive, elevated lactic acid level, leukocytosis and initially started on IV vancomycin  and IV cefepime , IV Flagyl . - Once cultures had resulted IV vancomycin  was changed to meropenem . - 2/2 blood cultures with Staph epidermidis methicillin resistance detected, Proteus, Staphylococcus capitis. -Urine cultures with > 100,000 colonies of Proteus Mirabilis, > 100,000 colonies of ESBL E. coli - Patient seen in consultation by ID who reviewed cultures and sensitivities and recommended placing patient on Augmentin  to cover Proteus and anaerobes in the wound, while continuing vancomycin  and patient and vancomycin  subsequently changed to Zyvox  to complete a total of 2 weeks of coverage targeting Staphylococcus species and Proteus. -Outpatient follow-up with PCP.   2.  Stage IV decubitus ulcer - Patient seen by wound care. -Wound care recommendations were made which patient will be discharged home on. - See wound care recommendations.   3.  History of prostate cancer/BPH - Patient noted to be on hospice prior to admission. - Hospice liaison discussed with family and patient to return back home with hospice following on discharge.   4.  Hypertension -Patient noted to be on Norvasc  and losartan  prior to admission which were held  due to soft blood pressure early on in the hospitalization. - BP currently stable. - Patient placed back on Norvasc  5 mg daily.   - Patient is diuretics and ARB were held during the hospitalization and will be held on discharge until follow-up with PCP.    5.  Hyperlipidemia - Patient maintained on home regimen statin.   6.   Diabetes mellitus type 2 -Hemoglobin A1c noted at 5.1 on 11/26/2021. - CBGs remain well-controlled during the hospitalization.   - Patient initially was on SSI during hospitalization which was subsequently discontinued.    7.  Hypokalemia -Repleted.   8.  Normocytic anemia/anemia of chronic disease/folate deficiency - Hemoglobin noted on 11/23/2024 at 6.9 from 7.3 from 7.6 from 7.5 from 7.7 from 7.6 from 7.9 from 11.2 from 9.7 on admission.  - Felt likely dilutional component. - Patient with no overt GI bleed. - FOBT ordered and pending.   -Anemia panel with iron level of 19, TIBC of 137, ferritin of 618, folate of 4.1, vitamin B12 of 1070. - Patient status post transfusion 2 units PRBCs with hemoglobin noted at 9.9 by day of discharge.  - Patient started on folic acid  1 mg p.o. daily. - Outpatient follow-up with PCP.   9.  AKI - Resolved with hydration. - Torsemide and ARB held during the hospitalization and will be held on discharge. -Outpatient follow-up with PCP.   10.  Pressure injury, POA Wound Pressure Injury Elbow Posterior;Left Stage 3 -  Full thickness tissue loss. Subcutaneous fat may be visible but bone, tendon or muscle are NOT exposed. (Active)     Procedures: Chest x-ray 11/16/2024 Transfuse 2 units PRBCs 11/23/2024  Consultations: ID: Dr.Van Dam 11/21/2024    Discharge Exam: Vitals:   11/24/24 1155 11/24/24 1159  BP: 129/63   Pulse: 60 (!) 55  Resp: 20   Temp: 98 F (36.7 C)   SpO2:      General: NAD Cardiovascular: RRR no murmurs rubs or gallops.  No JVD.  No pitting lower extremity edema. Respiratory: CTAB anterior lung fields.  No wheezes, no crackles, no rhonchi.  Fair air movement.  No use of accessory muscles of respiration.  Discharge Instructions   Discharge Instructions     Diet general   Complete by: As directed    Discharge wound care:   Complete by: As directed    Wound care  Daily      Comments: Cleanse R elbow wound with Vashe, do  not rinse. Apply silver  hydrofiber ([employer]son [phone] Aquacel AG) to wound bed daily and secure with silicone foam or Kerlix roll gauze whichever is preferred.  SOAK SILVER  WITH NORMAL SALINE IF ADHERED TO WOUND BED FOR ATRAUMATIC REMOVAL  11/17/24 0629      11/17/24 0500    Wound care  Daily      Comments: Cleanse sacral wound with Vashe, do not rinse. Using a Q tip applicator insert Vashe moistened kerlix roll gauze into wound bed making sure to cover area of depth, cover with dry gauze and ABD pad and tape or silicone foam whichever is preferred  11/16/24 2052   Increase activity slowly   Complete by: As directed       Allergies as of 11/24/2024       Reactions   Telmisartan Other (See Comments)   Reaction:  Headache         Medication List     PAUSE taking these medications    losartan  25 MG tablet Wait to take  this until your doctor or other care provider tells you to start again. Commonly known as: COZAAR  Take 1 tablet (25 mg total) by mouth daily. What changed: how much to take   torsemide 5 MG tablet Wait to take this until your doctor or other care provider tells you to start again. Commonly known as: DEMADEX Take 5 mg by mouth daily.       TAKE these medications    acetaminophen  500 MG tablet Commonly known as: TYLENOL  Take 1,000 mg by mouth every 6 (six) hours as needed for mild pain.   alendronate  70 MG tablet Commonly known as: FOSAMAX  Take 1 tablet (70 mg total) by mouth every 7 (seven) days. Take with a full glass of water  on an empty stomach.   amLODipine  5 MG tablet Commonly known as: NORVASC  Take 1 tablet (5 mg total) by mouth daily. What changed:  medication strength how much to take   amoxicillin -clavulanate 875-125 MG tablet Commonly known as: AUGMENTIN  Take 1 tablet by mouth every 12 (twelve) hours for 7 days.   aspirin  EC 81 MG tablet Take 81 mg by mouth daily. Swallow whole.   atorvastatin  40 MG tablet Commonly known as:  LIPITOR Take 1 tablet (40 mg total) by mouth daily.   bismuth  subsalicylate 262 MG chewable tablet Commonly known as: Pepto-Bismol Chew 2 tablets (524 mg total) by mouth as needed.   cholecalciferol 25 MCG (1000 UNIT) tablet Commonly known as: VITAMIN D3 Take 1,000 Units by mouth daily.   cholestyramine  4 g packet Commonly known as: Questran  Take 1 packet (4 g total) by mouth 3 (three) times daily with meals.   diclofenac  Sodium 1 % Gel Commonly known as: Voltaren  Apply a small grape sized dollop to painful joints as needed. What changed:  how much to take how to take this when to take this reasons to take this   folic acid  1 MG tablet Commonly known as: FOLVITE  Take 1 tablet (1 mg total) by mouth daily. Start taking on: November 25, 2024   haloperidol  1 MG tablet Commonly known as: HALDOL  Take 1 mg by mouth every 4 (four) hours as needed.   linezolid  600 MG tablet Commonly known as: ZYVOX  Take 1 tablet (600 mg total) by mouth every 12 (twelve) hours for 7 days.   loperamide  2 MG capsule Commonly known as: IMODIUM  Take 1 capsule (2 mg total) by mouth 4 (four) times daily as needed for diarrhea or loose stools.   Metamucil Smooth Texture 58.6 % powder Generic drug: psyllium Take 1 packet by mouth 3 (three) times daily.   ondansetron  4 MG disintegrating tablet Commonly known as: ZOFRAN -ODT Take 1 tablet (4 mg total) by mouth every 8 (eight) hours as needed for nausea or vomiting.   Oxycodone  HCl 10 MG Tabs Take 10 mg by mouth every 6 (six) hours as needed (pain).   tamsulosin  0.4 MG Caps capsule Commonly known as: FLOMAX  Take 1 capsule (0.4 mg total) by mouth daily.   traMADol  HCl 100 MG Tabs Take 1 tablet by mouth every 4 (four) hours as needed (pain).               Discharge Care Instructions  (From admission, onward)           Start     Ordered   11/24/24 0000  Discharge wound care:       Comments: Wound care  Daily      Comments: Cleanse R  elbow wound with Vashe, do  not rinse. Apply silver  hydrofiber ([employer]son [phone] Aquacel AG) to wound bed daily and secure with silicone foam or Kerlix roll gauze whichever is preferred.  SOAK SILVER  WITH NORMAL SALINE IF ADHERED TO WOUND BED FOR ATRAUMATIC REMOVAL  11/17/24 0629      11/17/24 0500    Wound care  Daily      Comments: Cleanse sacral wound with Vashe, do not rinse. Using a Q tip applicator insert Vashe moistened kerlix roll gauze into wound bed making sure to cover area of depth, cover with dry gauze and ABD pad and tape or silicone foam whichever is preferred  11/16/24 2052   11/24/24 1215           Allergies  Allergen Reactions   Telmisartan Other (See Comments)    Reaction:  Headache     Follow-up Information     Paliwal, Himanshu, MD. Schedule an appointment as soon as possible for a visit in 2 week(s).   Specialty: Family Medicine Contact information: [address] [place] [address] [phone]         Hospice Follow up.   Why: Patient will discharge home with hospice, follow-up with hospice.        [facility] Follow up.   Specialty: Hospice and Palliative Medicine Contact information: [address] [place]  [address] [phone]                 The results of significant diagnostics from this hospitalization (including imaging, microbiology, ancillary and laboratory) are listed below for reference.    Significant Diagnostic Studies: DG Chest Port 1 View Result Date: 11/16/2024 CLINICAL DATA:  Patient normal yesterday, today not speaking. Groaning with apparent pain. EXAM: PORTABLE CHEST 1 VIEW COMPARISON:  08/09/2022. FINDINGS: Cardiac silhouette is normal size.  No mediastinal or hilar masses. Clear lungs.  No pleural effusion or pneumothorax. Skeletal structures grossly intact. IMPRESSION: No active disease. Electronically Signed   By: Alm Parkins M.D.   On: 11/16/2024 16:15    Microbiology: Recent  Results (from the past 240 hours)  Resp panel by RT-PCR (RSV, Flu A&B, Covid) Anterior Nasal Swab     Status: None   Collection Time: 11/16/24  4:10 PM   Specimen: Anterior Nasal Swab  Result Value Ref Range Status   SARS Coronavirus 2 by RT PCR NEGATIVE NEGATIVE Final    Comment: (NOTE) SARS-CoV-2 target nucleic acids are NOT DETECTED.  The SARS-CoV-2 RNA is generally detectable in upper respiratory specimens during the acute phase of infection. The lowest concentration of SARS-CoV-2 viral copies this assay can detect is 138 copies/mL. A negative result does not preclude SARS-Cov-2 infection and should not be used as the sole basis for treatment or other patient management decisions. A negative result may occur with  improper specimen collection/handling, submission of specimen other than nasopharyngeal swab, presence of viral mutation(s) within the areas targeted by this assay, and inadequate number of viral copies(<138 copies/mL). A negative result must be combined with clinical observations, patient history, and epidemiological information. The expected result is Negative.  Fact Sheet for Patients:  bloggercourse.com  Fact Sheet for Healthcare Providers:  seriousbroker.it  This test is no t yet approved or cleared by the [place]  FDA and  has been authorized for detection and/or diagnosis of SARS-CoV-2 by FDA under an Emergency Use Authorization (EUA). This EUA will remain  in effect (meaning this test can be used) for the duration of the COVID-19 declaration under Section 564(b)(1) of the Act, 21  U.S.C.section 360bbb-3(b)(1), unless the authorization is terminated  or revoked sooner.       Influenza A by PCR NEGATIVE NEGATIVE Final   Influenza B by PCR NEGATIVE NEGATIVE Final    Comment: (NOTE) The Xpert Xpress SARS-CoV-2/FLU/RSV plus assay is intended as an aid in the diagnosis of influenza from Nasopharyngeal swab  specimens and should not be used as a sole basis for treatment. Nasal washings and aspirates are unacceptable for Xpert Xpress SARS-CoV-2/FLU/RSV testing.  Fact Sheet for Patients: bloggercourse.com  Fact Sheet for Healthcare Providers: seriousbroker.it  This test is not yet approved or cleared by the [place]  FDA and has been authorized for detection and/or diagnosis of SARS-CoV-2 by FDA under an Emergency Use Authorization (EUA). This EUA will remain in effect (meaning this test can be used) for the duration of the COVID-19 declaration under Section 564(b)(1) of the Act, 21 U.S.C. section 360bbb-3(b)(1), unless the authorization is terminated or revoked.     Resp Syncytial Virus by PCR NEGATIVE NEGATIVE Final    Comment: (NOTE) Fact Sheet for Patients: bloggercourse.com  Fact Sheet for Healthcare Providers: seriousbroker.it  This test is not yet approved or cleared by the [place]  FDA and has been authorized for detection and/or diagnosis of SARS-CoV-2 by FDA under an Emergency Use Authorization (EUA). This EUA will remain in effect (meaning this test can be used) for the duration of the COVID-19 declaration under Section 564(b)(1) of the Act, 21 U.S.C. section 360bbb-3(b)(1), unless the authorization is terminated or revoked.  Performed at [facility], [address] [place], [place] [address]   Blood Culture (routine x 2)     Status: Abnormal   Collection Time: 11/16/24  4:11 PM   Specimen: BLOOD LEFT WRIST  Result Value Ref Range Status   Specimen Description   Final    BLOOD LEFT WRIST Performed at [facility], [address], [place], [place] [address]    Special Requests   Final    BOTTLES DRAWN AEROBIC AND ANAEROBIC Blood Culture results may not be optimal due to an inadequate volume of blood received in culture  bottles Performed at [facility], [address] [place], [place] [address]    Culture  Setup Time   Final    GRAM NEGATIVE RODS ANAEROBIC BOTTLE ONLY CRITICAL RESULT CALLED TO, READ BACK BY AND VERIFIED WITH: PHARMD CHRISTINE SHADE [phone] AT 1120 BY EC GRAM POSITIVE COCCI AEROBIC BOTTLE ONLY CRITICAL VALUE NOTED.  VALUE IS CONSISTENT WITH PREVIOUSLY REPORTED AND CALLED VALUE. Performed at [facility], [address], [place], [place] [address]    Culture (A)  Final    PROTEUS MIRABILIS STAPHYLOCOCCUS EPIDERMIDIS STAPHYLOCOCCUS CAPITIS    Report Status 11/20/2024 FINAL  Final   Organism ID, Bacteria PROTEUS MIRABILIS  Final   Organism ID, Bacteria STAPHYLOCOCCUS EPIDERMIDIS  Final   Organism ID, Bacteria STAPHYLOCOCCUS CAPITIS  Final      Susceptibility   Proteus mirabilis - MIC*    AMPICILLIN <=2 SENSITIVE Sensitive     CEFAZOLIN  (NON-URINE) 4 INTERMEDIATE Intermediate     CEFEPIME  <=0.12 SENSITIVE Sensitive     ERTAPENEM <=0.12 SENSITIVE Sensitive     CEFTRIAXONE  <=0.25 SENSITIVE Sensitive     CIPROFLOXACIN  0.25 SENSITIVE Sensitive     GENTAMICIN <=1 SENSITIVE Sensitive     MEROPENEM  1 SENSITIVE Sensitive     TRIMETH /SULFA  <=20 SENSITIVE Sensitive     AMPICILLIN/SULBACTAM <=2 SENSITIVE Sensitive     PIP/TAZO Value in  next row Sensitive      <=4 SENSITIVEThis is a modified FDA-approved test that has been validated and its performance characteristics determined by the reporting laboratory.  This laboratory is certified under the Clinical Laboratory Improvement Amendments CLIA as qualified to perform high complexity clinical laboratory testing.    * PROTEUS MIRABILIS   Staphylococcus capitis - MIC*    CIPROFLOXACIN  Value in next row Sensitive      <=4 SENSITIVEThis is a modified FDA-approved test that has been validated and its performance characteristics determined by the reporting laboratory.  This laboratory is certified under the Clinical  Laboratory Improvement Amendments CLIA as qualified to perform high complexity clinical laboratory testing.    ERYTHROMYCIN Value in next row Resistant      <=4 SENSITIVEThis is a modified FDA-approved test that has been validated and its performance characteristics determined by the reporting laboratory.  This laboratory is certified under the Clinical Laboratory Improvement Amendments CLIA as qualified to perform high complexity clinical laboratory testing.    GENTAMICIN Value in next row Sensitive      <=4 SENSITIVEThis is a modified FDA-approved test that has been validated and its performance characteristics determined by the reporting laboratory.  This laboratory is certified under the Clinical Laboratory Improvement Amendments CLIA as qualified to perform high complexity clinical laboratory testing.    OXACILLIN Value in next row Sensitive      <=4 SENSITIVEThis is a modified FDA-approved test that has been validated and its performance characteristics determined by the reporting laboratory.  This laboratory is certified under the Clinical Laboratory Improvement Amendments CLIA as qualified to perform high complexity clinical laboratory testing.    TETRACYCLINE Value in next row Sensitive      <=4 SENSITIVEThis is a modified FDA-approved test that has been validated and its performance characteristics determined by the reporting laboratory.  This laboratory is certified under the Clinical Laboratory Improvement Amendments CLIA as qualified to perform high complexity clinical laboratory testing.    VANCOMYCIN  Value in next row Sensitive      <=4 SENSITIVEThis is a modified FDA-approved test that has been validated and its performance characteristics determined by the reporting laboratory.  This laboratory is certified under the Clinical Laboratory Improvement Amendments CLIA as qualified to perform high complexity clinical laboratory testing.    TRIMETH /SULFA  Value in next row Sensitive      <=4  SENSITIVEThis is a modified FDA-approved test that has been validated and its performance characteristics determined by the reporting laboratory.  This laboratory is certified under the Clinical Laboratory Improvement Amendments CLIA as qualified to perform high complexity clinical laboratory testing.    CLINDAMYCIN Value in next row Intermediate      <=4 SENSITIVEThis is a modified FDA-approved test that has been validated and its performance characteristics determined by the reporting laboratory.  This laboratory is certified under the Clinical Laboratory Improvement Amendments CLIA as qualified to perform high complexity clinical laboratory testing.    RIFAMPIN Value in next row Sensitive      <=4 SENSITIVEThis is a modified FDA-approved test that has been validated and its performance characteristics determined by the reporting laboratory.  This laboratory is certified under the Clinical Laboratory Improvement Amendments CLIA as qualified to perform high complexity clinical laboratory testing.    Inducible Clindamycin Value in next row Sensitive      <=4 SENSITIVEThis is a modified FDA-approved test that has been validated and its performance characteristics determined by the reporting laboratory.  This laboratory is certified under the  Clinical Laboratory Improvement Amendments CLIA as qualified to perform high complexity clinical laboratory testing.    * STAPHYLOCOCCUS CAPITIS   Staphylococcus epidermidis - MIC*    CIPROFLOXACIN  Value in next row Resistant      <=4 SENSITIVEThis is a modified FDA-approved test that has been validated and its performance characteristics determined by the reporting laboratory.  This laboratory is certified under the Clinical Laboratory Improvement Amendments CLIA as qualified to perform high complexity clinical laboratory testing.    ERYTHROMYCIN Value in next row Resistant      <=4 SENSITIVEThis is a modified FDA-approved test that has been validated and its  performance characteristics determined by the reporting laboratory.  This laboratory is certified under the Clinical Laboratory Improvement Amendments CLIA as qualified to perform high complexity clinical laboratory testing.    GENTAMICIN Value in next row Sensitive      <=4 SENSITIVEThis is a modified FDA-approved test that has been validated and its performance characteristics determined by the reporting laboratory.  This laboratory is certified under the Clinical Laboratory Improvement Amendments CLIA as qualified to perform high complexity clinical laboratory testing.    OXACILLIN Value in next row Resistant      <=4 SENSITIVEThis is a modified FDA-approved test that has been validated and its performance characteristics determined by the reporting laboratory.  This laboratory is certified under the Clinical Laboratory Improvement Amendments CLIA as qualified to perform high complexity clinical laboratory testing.    TETRACYCLINE Value in next row Sensitive      <=4 SENSITIVEThis is a modified FDA-approved test that has been validated and its performance characteristics determined by the reporting laboratory.  This laboratory is certified under the Clinical Laboratory Improvement Amendments CLIA as qualified to perform high complexity clinical laboratory testing.    VANCOMYCIN  Value in next row Sensitive      <=4 SENSITIVEThis is a modified FDA-approved test that has been validated and its performance characteristics determined by the reporting laboratory.  This laboratory is certified under the Clinical Laboratory Improvement Amendments CLIA as qualified to perform high complexity clinical laboratory testing.    TRIMETH /SULFA  Value in next row Sensitive      <=4 SENSITIVEThis is a modified FDA-approved test that has been validated and its performance characteristics determined by the reporting laboratory.  This laboratory is certified under the Clinical Laboratory Improvement Amendments CLIA as  qualified to perform high complexity clinical laboratory testing.    CLINDAMYCIN Value in next row Sensitive      <=4 SENSITIVEThis is a modified FDA-approved test that has been validated and its performance characteristics determined by the reporting laboratory.  This laboratory is certified under the Clinical Laboratory Improvement Amendments CLIA as qualified to perform high complexity clinical laboratory testing.    RIFAMPIN Value in next row Sensitive      <=4 SENSITIVEThis is a modified FDA-approved test that has been validated and its performance characteristics determined by the reporting laboratory.  This laboratory is certified under the Clinical Laboratory Improvement Amendments CLIA as qualified to perform high complexity clinical laboratory testing.    Inducible Clindamycin Value in next row Sensitive      <=4 SENSITIVEThis is a modified FDA-approved test that has been validated and its performance characteristics determined by the reporting laboratory.  This laboratory is certified under the Clinical Laboratory Improvement Amendments CLIA as qualified to perform high complexity clinical laboratory testing.    * STAPHYLOCOCCUS EPIDERMIDIS  Blood Culture ID Panel (Reflexed)     Status: Abnormal   Collection  Time: 11/16/24  4:11 PM  Result Value Ref Range Status   Enterococcus faecalis NOT DETECTED NOT DETECTED Final   Enterococcus Faecium NOT DETECTED NOT DETECTED Final   Listeria monocytogenes NOT DETECTED NOT DETECTED Final   Staphylococcus species DETECTED (A) NOT DETECTED Final    Comment: CRITICAL RESULT CALLED TO, READ BACK BY AND VERIFIED WITH: PHARMD CHRISTINE SHADE [id number] AT 1120 BY EC    Staphylococcus aureus (BCID) NOT DETECTED NOT DETECTED Final   Staphylococcus epidermidis DETECTED (A) NOT DETECTED Final    Comment: Methicillin (oxacillin) resistant coagulase negative staphylococcus. Possible blood culture contaminant (unless isolated from more than one blood culture  draw or clinical case suggests pathogenicity). No antibiotic treatment is indicated for blood  culture contaminants. CRITICAL RESULT CALLED TO, READ BACK BY AND VERIFIED WITH: PHARMD CHRISTINE SHADE [id number] AT 1120 BY EC    Staphylococcus lugdunensis NOT DETECTED NOT DETECTED Final   Streptococcus species NOT DETECTED NOT DETECTED Final   Streptococcus agalactiae NOT DETECTED NOT DETECTED Final   Streptococcus pneumoniae NOT DETECTED NOT DETECTED Final   Streptococcus pyogenes NOT DETECTED NOT DETECTED Final   A.calcoaceticus-baumannii NOT DETECTED NOT DETECTED Final   Bacteroides fragilis NOT DETECTED NOT DETECTED Final   Enterobacterales DETECTED (A) NOT DETECTED Final    Comment: Enterobacterales represent a large order of gram negative bacteria, not a single organism. CRITICAL RESULT CALLED TO, READ BACK BY AND VERIFIED WITH: PHARMD CHRISTINE SHADE [id number] AT 1120 BY EC    Enterobacter cloacae complex NOT DETECTED NOT DETECTED Final   Escherichia coli NOT DETECTED NOT DETECTED Final   Klebsiella aerogenes NOT DETECTED NOT DETECTED Final   Klebsiella oxytoca NOT DETECTED NOT DETECTED Final   Klebsiella pneumoniae NOT DETECTED NOT DETECTED Final   Proteus species DETECTED (A) NOT DETECTED Final    Comment: CRITICAL RESULT CALLED TO, READ BACK BY AND VERIFIED WITH: PHARMD CHRISTINE SHADE [id number] AT 1120 BY EC    Salmonella species NOT DETECTED NOT DETECTED Final   Serratia marcescens NOT DETECTED NOT DETECTED Final   Haemophilus influenzae NOT DETECTED NOT DETECTED Final   Neisseria meningitidis NOT DETECTED NOT DETECTED Final   Pseudomonas aeruginosa NOT DETECTED NOT DETECTED Final   Stenotrophomonas maltophilia NOT DETECTED NOT DETECTED Final   Candida albicans NOT DETECTED NOT DETECTED Final   Candida auris NOT DETECTED NOT DETECTED Final   Candida glabrata NOT DETECTED NOT DETECTED Final   Candida krusei NOT DETECTED NOT DETECTED Final   Candida parapsilosis NOT DETECTED  NOT DETECTED Final   Candida tropicalis NOT DETECTED NOT DETECTED Final   Cryptococcus neoformans/gattii NOT DETECTED NOT DETECTED Final   CTX-M ESBL NOT DETECTED NOT DETECTED Final   Carbapenem resistance IMP NOT DETECTED NOT DETECTED Final   Carbapenem resistance KPC NOT DETECTED NOT DETECTED Final   Methicillin resistance mecA/C DETECTED (A) NOT DETECTED Final    Comment: CRITICAL RESULT CALLED TO, READ BACK BY AND VERIFIED WITH: PHARMD CHRISTINE SHADE [id number] AT 1120 BY EC    Carbapenem resistance NDM NOT DETECTED NOT DETECTED Final   Carbapenem resist OXA 48 LIKE NOT DETECTED NOT DETECTED Final   Carbapenem resistance VIM NOT DETECTED NOT DETECTED Final    Comment: Performed at [facility], [address] [place], [place] [address]  Blood Culture (routine x 2)     Status: Abnormal   Collection Time: 11/16/24  4:16 PM   Specimen: BLOOD RIGHT WRIST  Result Value Ref Range Status   Specimen Description   Final  BLOOD RIGHT WRIST Performed at [facility], [address], [place], [place] [address]    Special Requests   Final    BOTTLES DRAWN AEROBIC AND ANAEROBIC Blood Culture results may not be optimal due to an inadequate volume of blood received in culture bottles Performed at [facility], [address] [place], [place] [address]    Culture  Setup Time   Final    GRAM POSITIVE COCCI AEROBIC BOTTLE ONLY CRITICAL RESULT CALLED TO, READ BACK BY AND VERIFIED WITH: PHARMD CRYSTAL ROBERTSON [id number] AT 1402 BY EC GRAM NEGATIVE RODS ANAEROBIC BOTTLE ONLY CRITICAL VALUE NOTED.  VALUE IS CONSISTENT WITH PREVIOUSLY REPORTED AND CALLED VALUE.    Culture (A)  Final    STAPHYLOCOCCUS CAPITIS STAPHYLOCOCCUS EPIDERMIDIS PROTEUS MIRABILIS SUSCEPTIBILITIES PERFORMED ON PREVIOUS CULTURE WITHIN THE LAST 5 DAYS. Performed at [facility], [address] [place], [place] [address]    Report Status 11/23/2024 FINAL  Final   Organism ID, Bacteria  STAPHYLOCOCCUS EPIDERMIDIS  Final   Organism ID, Bacteria STAPHYLOCOCCUS CAPITIS  Final      Susceptibility   Staphylococcus capitis - MIC*    CIPROFLOXACIN  <=0.5 SENSITIVE Sensitive     ERYTHROMYCIN >=8 RESISTANT Resistant     GENTAMICIN <=0.5 SENSITIVE Sensitive     OXACILLIN <=0.25 SENSITIVE Sensitive     TETRACYCLINE <=1 SENSITIVE Sensitive     VANCOMYCIN  <=0.5 SENSITIVE Sensitive     TRIMETH /SULFA  <=10 SENSITIVE Sensitive     CLINDAMYCIN INTERMEDIATE Intermediate     RIFAMPIN <=0.5 SENSITIVE Sensitive     Inducible Clindamycin NEGATIVE Sensitive     * STAPHYLOCOCCUS CAPITIS   Staphylococcus epidermidis - MIC*    CIPROFLOXACIN  >=8 RESISTANT Resistant     ERYTHROMYCIN >=8 RESISTANT Resistant     GENTAMICIN <=0.5 SENSITIVE Sensitive     OXACILLIN >=4 RESISTANT Resistant     TETRACYCLINE 2 SENSITIVE Sensitive     VANCOMYCIN  2 SENSITIVE Sensitive     TRIMETH /SULFA  <=10 SENSITIVE Sensitive     CLINDAMYCIN <=0.25 SENSITIVE Sensitive     RIFAMPIN <=0.5 SENSITIVE Sensitive     Inducible Clindamycin NEGATIVE Sensitive     * STAPHYLOCOCCUS EPIDERMIDIS  Urine Culture     Status: Abnormal   Collection Time: 11/16/24  4:39 PM   Specimen: Urine, Random  Result Value Ref Range Status   Specimen Description   Final    URINE, RANDOM Performed at [facility], [address] [place], [place] [address]    Special Requests   Final    NONE Reflexed from [id number] Performed at [facility], [address], [place], [place] [address]    Culture (A)  Final    >=100,000 COLONIES/mL PROTEUS MIRABILIS >=100,000 COLONIES/mL ESCHERICHIA COLI Confirmed Extended Spectrum Beta-Lactamase Producer (ESBL).  In bloodstream infections from ESBL organisms, carbapenems are preferred over piperacillin/tazobactam. They are shown to have a lower risk of mortality.    Report Status 11/18/2024 FINAL  Final   Organism ID, Bacteria PROTEUS MIRABILIS (A)  Final   Organism ID,  Bacteria ESCHERICHIA COLI (A)  Final      Susceptibility   Escherichia coli - MIC*    AMPICILLIN >=32 RESISTANT Resistant     CEFAZOLIN  (URINE) Value in next row Resistant      >=32 RESISTANTThis is a modified FDA-approved test that has been validated and its performance characteristics determined by the reporting laboratory.  This laboratory is certified under the Clinical Laboratory Improvement Amendments CLIA as  qualified to perform high complexity clinical laboratory testing.    CEFEPIME  Value in next row Intermediate      >=32 RESISTANTThis is a modified FDA-approved test that has been validated and its performance characteristics determined by the reporting laboratory.  This laboratory is certified under the Clinical Laboratory Improvement Amendments CLIA as qualified to perform high complexity clinical laboratory testing.    ERTAPENEM Value in next row Sensitive      >=32 RESISTANTThis is a modified FDA-approved test that has been validated and its performance characteristics determined by the reporting laboratory.  This laboratory is certified under the Clinical Laboratory Improvement Amendments CLIA as qualified to perform high complexity clinical laboratory testing.    CEFTRIAXONE  Value in next row Resistant      >=32 RESISTANTThis is a modified FDA-approved test that has been validated and its performance characteristics determined by the reporting laboratory.  This laboratory is certified under the Clinical Laboratory Improvement Amendments CLIA as qualified to perform high complexity clinical laboratory testing.    CIPROFLOXACIN  Value in next row Resistant      >=32 RESISTANTThis is a modified FDA-approved test that has been validated and its performance characteristics determined by the reporting laboratory.  This laboratory is certified under the Clinical Laboratory Improvement Amendments CLIA as qualified to perform high complexity clinical laboratory testing.    GENTAMICIN Value in  next row Sensitive      >=32 RESISTANTThis is a modified FDA-approved test that has been validated and its performance characteristics determined by the reporting laboratory.  This laboratory is certified under the Clinical Laboratory Improvement Amendments CLIA as qualified to perform high complexity clinical laboratory testing.    NITROFURANTOIN Value in next row Sensitive      >=32 RESISTANTThis is a modified FDA-approved test that has been validated and its performance characteristics determined by the reporting laboratory.  This laboratory is certified under the Clinical Laboratory Improvement Amendments CLIA as qualified to perform high complexity clinical laboratory testing.    TRIMETH /SULFA  Value in next row Resistant      >=32 RESISTANTThis is a modified FDA-approved test that has been validated and its performance characteristics determined by the reporting laboratory.  This laboratory is certified under the Clinical Laboratory Improvement Amendments CLIA as qualified to perform high complexity clinical laboratory testing.    AMPICILLIN/SULBACTAM Value in next row Resistant      >=32 RESISTANTThis is a modified FDA-approved test that has been validated and its performance characteristics determined by the reporting laboratory.  This laboratory is certified under the Clinical Laboratory Improvement Amendments CLIA as qualified to perform high complexity clinical laboratory testing.    PIP/TAZO Value in next row Sensitive      <=4 SENSITIVEThis is a modified FDA-approved test that has been validated and its performance characteristics determined by the reporting laboratory.  This laboratory is certified under the Clinical Laboratory Improvement Amendments CLIA as qualified to perform high complexity clinical laboratory testing.    MEROPENEM  Value in next row Sensitive      <=4 SENSITIVEThis is a modified FDA-approved test that has been validated and its performance characteristics determined by  the reporting laboratory.  This laboratory is certified under the Clinical Laboratory Improvement Amendments CLIA as qualified to perform high complexity clinical laboratory testing.    * >=100,000 COLONIES/mL ESCHERICHIA COLI   Proteus mirabilis - MIC*    AMPICILLIN Value in next row Sensitive      <=4 SENSITIVEThis is a modified FDA-approved test that has been validated and  its performance characteristics determined by the reporting laboratory.  This laboratory is certified under the Clinical Laboratory Improvement Amendments CLIA as qualified to perform high complexity clinical laboratory testing.    CEFAZOLIN  (URINE) Value in next row Sensitive      4 SENSITIVEThis is a modified FDA-approved test that has been validated and its performance characteristics determined by the reporting laboratory.  This laboratory is certified under the Clinical Laboratory Improvement Amendments CLIA as qualified to perform high complexity clinical laboratory testing.    CEFEPIME  Value in next row Sensitive      4 SENSITIVEThis is a modified FDA-approved test that has been validated and its performance characteristics determined by the reporting laboratory.  This laboratory is certified under the Clinical Laboratory Improvement Amendments CLIA as qualified to perform high complexity clinical laboratory testing.    ERTAPENEM Value in next row Sensitive      4 SENSITIVEThis is a modified FDA-approved test that has been validated and its performance characteristics determined by the reporting laboratory.  This laboratory is certified under the Clinical Laboratory Improvement Amendments CLIA as qualified to perform high complexity clinical laboratory testing.    CEFTRIAXONE  Value in next row Sensitive      4 SENSITIVEThis is a modified FDA-approved test that has been validated and its performance characteristics determined by the reporting laboratory.  This laboratory is certified under the Clinical Laboratory Improvement  Amendments CLIA as qualified to perform high complexity clinical laboratory testing.    CIPROFLOXACIN  Value in next row Sensitive      4 SENSITIVEThis is a modified FDA-approved test that has been validated and its performance characteristics determined by the reporting laboratory.  This laboratory is certified under the Clinical Laboratory Improvement Amendments CLIA as qualified to perform high complexity clinical laboratory testing.    GENTAMICIN Value in next row Sensitive      4 SENSITIVEThis is a modified FDA-approved test that has been validated and its performance characteristics determined by the reporting laboratory.  This laboratory is certified under the Clinical Laboratory Improvement Amendments CLIA as qualified to perform high complexity clinical laboratory testing.    NITROFURANTOIN Value in next row Resistant      4 SENSITIVEThis is a modified FDA-approved test that has been validated and its performance characteristics determined by the reporting laboratory.  This laboratory is certified under the Clinical Laboratory Improvement Amendments CLIA as qualified to perform high complexity clinical laboratory testing.    TRIMETH /SULFA  Value in next row Sensitive      4 SENSITIVEThis is a modified FDA-approved test that has been validated and its performance characteristics determined by the reporting laboratory.  This laboratory is certified under the Clinical Laboratory Improvement Amendments CLIA as qualified to perform high complexity clinical laboratory testing.    AMPICILLIN/SULBACTAM Value in next row Sensitive      4 SENSITIVEThis is a modified FDA-approved test that has been validated and its performance characteristics determined by the reporting laboratory.  This laboratory is certified under the Clinical Laboratory Improvement Amendments CLIA as qualified to perform high complexity clinical laboratory testing.    PIP/TAZO Value in next row Sensitive      <=4 SENSITIVEThis is a  modified FDA-approved test that has been validated and its performance characteristics determined by the reporting laboratory.  This laboratory is certified under the Clinical Laboratory Improvement Amendments CLIA as qualified to perform high complexity clinical laboratory testing.    MEROPENEM  Value in next row Sensitive      <=4 SENSITIVEThis is a  modified FDA-approved test that has been validated and its performance characteristics determined by the reporting laboratory.  This laboratory is certified under the Clinical Laboratory Improvement Amendments CLIA as qualified to perform high complexity clinical laboratory testing.    * >=100,000 COLONIES/mL PROTEUS MIRABILIS  Culture, blood (Routine X 2) w Reflex to ID Panel     Status: None (Preliminary result)   Collection Time: 11/20/24  9:13 AM   Specimen: BLOOD LEFT ARM  Result Value Ref Range Status   Specimen Description   Final    BLOOD LEFT ARM Performed at [facility], [address] [place], [place] [address]    Special Requests   Final    BOTTLES DRAWN AEROBIC ONLY Blood Culture results may not be optimal due to an inadequate volume of blood received in culture bottles Performed at [facility], [address], [place], [place] [address]    Culture   Final    NO GROWTH 4 DAYS Performed at [facility], [address], [place], [place] [address]    Report Status PENDING  Incomplete  Culture, blood (Routine X 2) w Reflex to ID Panel     Status: None (Preliminary result)   Collection Time: 11/20/24  9:13 AM   Specimen: BLOOD LEFT HAND  Result Value Ref Range Status   Specimen Description   Final    BLOOD LEFT HAND Performed at [facility], [address], [place], [place] [address]    Special Requests   Final    BOTTLES DRAWN AEROBIC ONLY Blood Culture results may not be optimal due to an inadequate volume of blood received in culture bottles Performed at [facility],  [address], [place], [place] [address]    Culture   Final    NO GROWTH 4 DAYS Performed at [facility], [address], [place], [place] [address]    Report Status PENDING  Incomplete     Labs: Basic Metabolic Panel: Recent Labs  Lab 11/18/24 0540 11/19/24 0450 11/20/24 0534 11/20/24 0837 11/21/24 0459 11/22/24 1355 11/23/24 0901  NA 138 141 141  --  141 140 141  K 2.8* 4.0 4.0  --  3.8 3.8 3.6  CL 107 111 112*  --  111 109 108  CO2 24 24 26   --  26 27 27   GLUCOSE 93 80 82  --  95 111* 89  BUN 26* 18 12  --  10 9 10   CREATININE 0.87 0.72 0.53*  --  0.62 0.55* 0.68  CALCIUM  8.1* 8.4* 8.8*  --  8.7* 8.9 8.9  MG 1.5*  --   --  1.8  --   --   --    Liver Function Tests: No results for input(s): AST, ALT, ALKPHOS, BILITOT, PROT, ALBUMIN in the last 168 hours.  No results for input(s): LIPASE, AMYLASE in the last 168 hours. No results for input(s): AMMONIA in the last 168 hours. CBC: Recent Labs  Lab 11/20/24 0534 11/21/24 0459 11/22/24 1355 11/23/24 0901 11/24/24 0621  WBC 7.4 6.2 5.2 5.9 7.6  NEUTROABS  --   --   --  3.4  --   HGB 7.5* 7.6* 7.3* 6.9* 9.9*  HCT 25.0* 24.8* 23.4* 22.4* 30.3*  MCV 99.2 97.3 98.3 100.0 93.8  PLT 221 235 259 279 229   Cardiac Enzymes: No results for input(s): CKTOTAL, CKMB, CKMBINDEX, TROPONINI in the last 168 hours. BNP: BNP (last 3 results)  No results for input(s): BNP in the last 8760 hours.  ProBNP (last 3 results) No results for input(s): PROBNP in the last 8760 hours.  CBG: Recent Labs  Lab 11/23/24 1204 11/23/24 1705 11/23/24 2116 11/24/24 0755 11/24/24 1158  GLUCAP 93 108* 94 75 91       Signed:  Toribio Hummer MD.  [facility] 11/24/2024, 3:36 PM

## 2024-11-25 LAB — CULTURE, BLOOD (ROUTINE X 2)
Culture: NO GROWTH
Culture: NO GROWTH

## 2024-11-26 LAB — BPAM RBC
Blood Product Expiration Date: 202512292359
Blood Product Expiration Date: 202512292359
ISSUE DATE / TIME: 202512051234
ISSUE DATE / TIME: 202512051631
Unit Type and Rh: 7300
Unit Type and Rh: 7300

## 2024-11-26 LAB — TYPE AND SCREEN
ABO/RH(D): B POS
Antibody Screen: NEGATIVE
Unit division: 0
Unit division: 0
